# Patient Record
Sex: Male | Born: 1950
Health system: Southern US, Community
[De-identification: ages and names within clinical notes are randomized; demographics above are authoritative.]

## PROBLEM LIST (undated history)

## (undated) DIAGNOSIS — I447 Left bundle-branch block, unspecified: Secondary | ICD-10-CM

## (undated) DIAGNOSIS — N486 Induration penis plastica: Secondary | ICD-10-CM

## (undated) DIAGNOSIS — Z95810 Presence of automatic (implantable) cardiac defibrillator: Secondary | ICD-10-CM

## (undated) DIAGNOSIS — M1990 Unspecified osteoarthritis, unspecified site: Secondary | ICD-10-CM

## (undated) DIAGNOSIS — Z950 Presence of cardiac pacemaker: Secondary | ICD-10-CM

## (undated) HISTORY — PX: CARDIOVASCULAR STRESS TEST: SHX262

---

## 1991-03-19 HISTORY — PX: OTHER SURGICAL HISTORY: SHX169

## 2002-10-18 ENCOUNTER — Inpatient Hospital Stay (HOSPITAL_COMMUNITY): Admission: EM | Admit: 2002-10-18 | Discharge: 2002-10-18 | Payer: Self-pay | Admitting: Emergency Medicine

## 2002-10-18 ENCOUNTER — Encounter: Payer: Self-pay | Admitting: *Deleted

## 2002-10-18 ENCOUNTER — Encounter: Payer: Self-pay | Admitting: Emergency Medicine

## 2003-03-19 HISTORY — PX: OTHER SURGICAL HISTORY: SHX169

## 2003-12-07 ENCOUNTER — Encounter: Admission: RE | Admit: 2003-12-07 | Discharge: 2003-12-07 | Payer: Self-pay | Admitting: Family Medicine

## 2003-12-19 ENCOUNTER — Encounter: Admission: RE | Admit: 2003-12-19 | Discharge: 2003-12-19 | Payer: Self-pay | Admitting: Gastroenterology

## 2003-12-26 ENCOUNTER — Ambulatory Visit (HOSPITAL_COMMUNITY): Admission: RE | Admit: 2003-12-26 | Discharge: 2003-12-26 | Payer: Self-pay | Admitting: Gastroenterology

## 2003-12-26 ENCOUNTER — Encounter (INDEPENDENT_AMBULATORY_CARE_PROVIDER_SITE_OTHER): Payer: Self-pay | Admitting: Specialist

## 2003-12-26 LAB — HM COLONOSCOPY

## 2004-03-06 ENCOUNTER — Ambulatory Visit: Payer: Self-pay | Admitting: Ophthalmology

## 2004-03-18 HISTORY — PX: CATARACT EXTRACTION W/ INTRAOCULAR LENS IMPLANT: SHX1309

## 2007-10-09 ENCOUNTER — Encounter (INDEPENDENT_AMBULATORY_CARE_PROVIDER_SITE_OTHER): Payer: Self-pay | Admitting: General Surgery

## 2007-10-09 ENCOUNTER — Ambulatory Visit (HOSPITAL_COMMUNITY): Admission: RE | Admit: 2007-10-09 | Discharge: 2007-10-09 | Payer: Self-pay | Admitting: General Surgery

## 2007-10-09 HISTORY — PX: LAPAROSCOPIC CHOLECYSTECTOMY: SUR755

## 2008-02-29 LAB — HM COLONOSCOPY

## 2010-07-31 NOTE — Op Note (Signed)
NAME:  George [name], George [name] NO.:  [id number]   MEDICAL RECORD NO.:  [id number]          PATIENT TYPE:  AMB   LOCATION:  DAY                          FACILITY:  [facility]   PHYSICIAN:  Sharlet Salina T. Hoxworth, M.D.DATE OF BIRTH:  August 28, 1950   DATE OF PROCEDURE:  10/09/2007  DATE OF DISCHARGE:                               OPERATIVE REPORT   PREOPERATIVE DIAGNOSIS:  Cholecystitis.   POSTOPERATIVE DIAGNOSIS:  Cholecystitis.   SURGICAL PROCEDURES:  Laparoscopic cholecystectomy with intraoperative  cholangiogram.   SURGEON:  Sharlet Salina T. Hoxworth, M.D.   ANESTHESIA:  General.   BRIEF HISTORY:  [name] is a 60 year old male with a previous history  of intermittent right upper quadrant pain.  He now presents with  persistent pain over several days and tenderness on exam.  Gallbladder  ultrasound has shown thickening and edema of the gallbladder wall and a  7 mm gallbladder polyp.  He is felt to have some degree of acute  cholecystitis and urgent laparoscopic cholecystectomy with cholangiogram  has been recommended and accepted.  The nature of the procedure,  indications, risks of bleeding, infection, bile leak, bile duct injury  were discussed and understood.  He is now brought to the operating room  for this procedure.   DESCRIPTION OF OPERATION:  The patient was brought to the operating  room, placed in supine position on the operating table and general  endotracheal anesthesia was induced.  PAS were in place.  He received  preoperative IV antibiotics.  The abdomen was widely sterilely prepped  and draped.  Correct patient and procedure were verified.  The trocar  sites were anesthetized with local anesthesia.  Open Hasson technique  was used at the umbilicus.  Pneumoperitoneum was established and trocars  placed under direct vision.  The gallbladder was somewhat edematous,  fairly distended and had some evidence of chronic inflammation as well.  The fundus was grasped  and elevated up over the liver and then retracted  inferolaterally.  Peritoneum anterior and posterior to Calot's triangle  was incised and the distal gallbladder dissected.  The cystic artery was  clearly identified coursing up onto the gallbladder wall and was divided  between two proximal and one distal clip.  Calot's triangle was then  skeletonized and the cystic duct gallbladder junction dissected 360  degrees.  When the anatomy was clear, the cystic duct was clipped at the  gallbladder junction and an operative cholangiogram obtained through the  cystic duct.  This showed good filling of the intrahepatic ducts and  common bile duct with free flow into the duodenum and no filling  defects.  Following this, the cholangiocath was removed and the cystic  duct was triply clipped proximally and divided.  The gallbladder was  then dissected free from its bed using hook cautery and removed via an  EndoCatch bag.  The right  upper quadrant was thoroughly irrigated.  Hemostasis assured.  Trocars  were removed under direct vision, all CO2 evacuated.  The mattress  suture was secured at the umbilicus.  Skin incisions were closed with  Dermabond.  The patient  was taken to recovery in good condition.      Lorne Skeens. Hoxworth, M.D.  Electronically Signed     BTH/MEDQ  D:  10/09/2007  T:  10/09/2007  Job:  [id number]

## 2010-08-03 NOTE — Op Note (Signed)
NAME:  GIULIO, BERTINO NO.:  [id number]   MEDICAL RECORD NO.:  [id number]          PATIENT TYPE:  AMB   LOCATION:  ENDO                         FACILITY:  [facility]   PHYSICIAN:  Graylin Shiver, M.D.   DATE OF BIRTH:  10/19/1950   DATE OF PROCEDURE:  12/26/2003  DATE OF DISCHARGE:                                 OPERATIVE REPORT   INDICATION:  Rectal bleeding.   ENDOSCOPIST:  Graylin Shiver, M.D.   INDICATION:  Informed consent was obtained after explanation of the risks of  bleeding, infection and perforation.   PRE-MEDICATIONS:  Fentanyl 85 mcg IV, Versed 8.5 mg IV.   PROCEDURE:  With the patient in the left lateral decubitus position, a  rectal exam was performed; no masses were felt.  The Olympus colonoscope was  inserted into the rectum and advanced around the colon to the cecum; cecal  landmarks were identified.  The cecum looked normal.  There were some  scattered diverticula noted in the ascending colon and transverse colon.  The descending colon and sigmoid showed extensive diverticulosis.  In the  proximal sigmoid, there was a 12-mm pedunculated polyp; this was snared and  removed by snare-cautery technique.  The cautery site looked good and the  polyp was retrieved.  The rectum was normal.  He tolerated the procedure  well without complications.   IMPRESSION:  1.  Sigmoid polyp.  2.  Universal diverticulosis.   PLAN:  The pathology will be checked.       SFG/MEDQ  D:  12/26/2003  T:  12/27/2003  Job:  [id number]   cc:   Zollie Pee.D.

## 2010-08-03 NOTE — Discharge Summary (Signed)
   NAME:  George [name], George [name] NO.:  [id number]   MEDICAL RECORD NO.:  [id number]                   PATIENT TYPE:  INP   LOCATION:  2019                                 FACILITY:  [facility]   PHYSICIAN:  Olga Millers, M.D.                DATE OF BIRTH:  [birth year]   DATE OF ADMISSION:  10/18/2002  DATE OF DISCHARGE:  10/18/2002                           DISCHARGE SUMMARY - REFERRING   PROCEDURES:  Adenosine Cardiolite.   REASON FOR ADMISSION:  Please refer to the dictated admission note.   LABORATORY DATA:  Cardiac enzymes:  CPK-MB and troponin I markers normal (x  3).  Hemoglobin/hematocrit normal.  Sodium 138, potassium 3.2, glucose 118,  BUN 16, creatinine 0.8.   Admission chest x-ray:  No acute abnormality.   HOSPITAL COURSE:  Following admission for a singular episode of atypical  chest pain, the patient ruled out for myocardial infarction with all serial  cardiac markers within normal limits.  He was thus referred for stress  Cardiolite testing.   The patient underwent adenosine Cardiolite stress test with no noted chest  discomfort.  No significant ST abnormalities noted during serial EKG  tracing.  Subsequent review of perfusion images revealed no evidence of  ischemia; calculated ejection fraction 60%.   These results were reviewed with Charlies Constable, M.D.  The patient was cleared  for discharge with recommendation to proceed with an outpatient 2-D  echocardiogram.   DISCHARGE MEDICATIONS:  Coated aspirin 81 mg daily.   DISCHARGE INSTRUCTIONS:  2-D echocardiogram scheduled for Monday, November 01, 2002, at 10 a.m.  The patient is scheduled to follow up with Charlies Constable,  M.D., on November 04, 2002, at 9:45 a.m.   DISCHARGE DIAGNOSES:  1. Nonischemic chest pain.     a. Normal serial cardiac markers.     b. Normal adenosine Cardiolite; ejection fraction 60%.  2. Left bundle branch block.  3. Hypokalemia.      Gene Serpe, P.A. [facility]                       Olga Millers, M.D.    GS/MEDQ  D:  10/18/2002  T:  10/18/2002  Job:  [id number]

## 2010-08-03 NOTE — Discharge Summary (Signed)
   NAME:  TEDRIC, LEETH                            ACCOUNT NO.:  [id number]   MEDICAL RECORD NO.:  [id number]                   PATIENT TYPE:  INP   LOCATION:  2019                                 FACILITY:  [facility]   PHYSICIAN:  Gene Serpe, P.A. [facility]                DATE OF BIRTH:  [birth year]   DATE OF ADMISSION:  10/18/2002  DATE OF DISCHARGE:  10/18/2002                           DISCHARGE SUMMARY - REFERRING   HOSPITAL COURSE:  Hypokalemia was noted and repleted prior to discharge.                                                   Gene Serpe, P.A. [facility]    GS/MEDQ  D:  10/18/2002  T:  10/18/2002  Job:  [id number]

## 2010-08-03 NOTE — H&P (Signed)
NAME:  George [name], George [name] NO.:  [id number]   MEDICAL RECORD NO.:  [id number]                   PATIENT TYPE:  EMS   LOCATION:  MAJO                                 FACILITY:  [facility]   PHYSICIAN:  Olga Millers, M.D.                DATE OF BIRTH:  24-Aug-1950   DATE OF ADMISSION:  10/18/2002  DATE OF DISCHARGE:                                HISTORY & PHYSICAL   PHYSICIANS:  Primary cardiologist: None.  Primary care physician: Lianne Bushy, M.D.   CHIEF COMPLAINT:  Chest pain.   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  However, this evening,  October 17, 2002, after sexual intercourse, had onset of right leg cramping  that preceded the onset of chest pain associated with  shortness of breath,  nausea, and two loose stools.  The patient denied any diaphoresis.  The pain  lasted for two to three hours and was accompanied by tingling all over and  hyperventilation, prompting the patient to call EMS.  EMS applied  nitroglycerin spray x 3 with no relief.  EKG revealed left bundle branch  block with no prior studies for comparison.  Chest pain eventually resolved  spontaneously around 5 a.m.   PAST MEDICAL HISTORY:  No tobacco, no drugs, no past medical history.   ALLERGIES:  No known drug allergies.   MEDICATIONS:  None.   SOCIAL HISTORY:  [place]  and works as a Chief Strategy Officer.  He denies any history of  tobacco, alcohol, or drug use.   FAMILY HISTORY:  Mother is alive in her 53s with hypertension,  hyperlipidemia, sleep apnea, and diabetes.  Father is alive at age 41 with  high blood pressure.   REVIEW OF SYSTEMS:  Essentially negative except for the chest pain,  shortness of breath, and loose stools with nausea as described  above.  The  patient did endorse some weakness in his extremities during this episode  which was somewhat poorly characterized.  He otherwise had no changes in  visual or auditory acuity, no neurologic changes or sensation defects.   PHYSICAL EXAMINATION:  VITAL SIGNS:  Temperature 98.6, heart rate 80,  respiratory rate 13, blood pressure 144/61, oxygen saturation 100% on 2  liters nasal cannula.  GENERAL:  No apparent distress.  HEENT:  Normocephalic and atraumatic.  Pupils are equal, round, and reactive  to light.  Extraocular movements intact.  NECK:  Supple without evidence of bruit or JVD.  No evidence of  lymphadenopathy.  CARDIOVASCULAR:  Normal S1 and S2 with no murmur.  LUNGS:  Clear to auscultation bilaterally.  SKIN:  No rash.  ABDOMEN:  Soft, nontender, nondistended with positive bowel sounds.  EXTREMITIES:  No edema, 2+ pulses in all four extremities easily palpated  and symmetric with no evidence of bruit.  NEUROLOGIC:  Grossly intact; however, gait was not tested.   LABORATORY DATA:  EKG: Normal sinus rhythm, rate of 77, and evidence of a  left bundle branch block as previously mentioned.   Hematocrit 46.  Potassium 3.2, sodium 138, chloride 107, bicarb 22, BUN 16,  creatinine 0.8, glucose 118.  Anion gap 13.  CK-MB 1.8, troponin I less than  0.05 with cardiac markers negative x 3.   ASSESSMENT AND PLAN:  1. A 60 year old with one episode of atypical chest pain, essentially ruled     out for myocardial infarction with negative cardiac markers x 3, left     bundle branch block on electrocardiogram that is presumable old.  We will     continue to leave patient n.p.o. with evaluation to rule out     insignificant ischemia with adenosine Cardiolite and check a lipid     profile for primary prevention of coronary artery disease.  2. Mild hypokalemia.  Will supplement with potassium 40 mEq p.o. x 1.  3. Other possible causes for patient's atypical chest pain: The  patient's     symptoms have completely resolved, and he has never had respiratory     distress as a predominant component of his symptoms; however,     consideration of a spiral CT to rule out pulmonary embolus could be     considered if patient should have recurrent symptoms and adenosine     Cardiolite is unrevealing.      Verne Grain, MD                      Olga Millers, M.D.    DDH/MEDQ  D:  10/18/2002  T:  10/18/2002  Job:  [id number]

## 2010-12-14 LAB — HEMOGLOBIN AND HEMATOCRIT, BLOOD
HCT: 41.2
Hemoglobin: 13.9

## 2011-11-21 ENCOUNTER — Other Ambulatory Visit: Payer: Self-pay | Admitting: Urology

## 2012-01-14 ENCOUNTER — Encounter (HOSPITAL_BASED_OUTPATIENT_CLINIC_OR_DEPARTMENT_OTHER): Payer: Self-pay | Admitting: *Deleted

## 2012-01-14 NOTE — Progress Notes (Signed)
NPO AFTER MN. ARRIVES AT 0615. NEEDS HG AND EKG.

## 2012-01-16 NOTE — H&P (Signed)
History of Present Illness      Peyronie's disease: He was able to identify penile trauma that occurred during intercourse in 5/13. He had significant pain but did not hear any cracking sound. He still has a firm erection but indicates that it been was somewhat downward into the left at the mid shaft. He also is still able to engage in intercourse although he said he does require some effort. He would like to consider some form of therapy.    He has a past urologic history of having a benign epididymal cyst and small varicocele.    Interval history: He reports there has been no increase in his curvature but there has also not been any decrease that he has been able to appreciate. He denies any pain with erections. He continues to still achieve an erection.   Past Medical History Problems  1. History of  Arthritis V13.4  Surgical History Problems  1. History of  Cholecystectomy 2. History of  Eye Surgery 3. History of  Hernia Repair 4. History of  Retinal Detachment Repair Topical Antibiotic Drops Applied  Current Meds 1. No Reported Medications  Allergies Medication  1. No Known Drug Allergies  Family History Problems  1. Paternal history of  Atrial Fibrillation 2. Maternal history of  Depression 3. Sororal history of  Depression 4. Fraternal history of  Depression 5. Family history of  Family Health Status Number Of Children 1 daughter, 1 son 6. Son's history of  Hematuria 7. Son's history of  Nephrolithiasis 8. Paternal uncle's history of  Nephrolithiasis  Social History Problems  1. Caffeine Use 2 litr of Dr. Reino Kent, and tea occassionally 2. Marital History - Currently Married 3. Never A Smoker 4. Occupation: Music therapist Denied  5. History of  Alcohol Use 6. History of  Tobacco Use Review of Systems Genitourinary, constitutional, skin, eye, otolaryngeal, hematologic/lymphatic, cardiovascular, pulmonary, endocrine, musculoskeletal, neurological and psychiatric  system(s) were reviewed and pertinent findings if present are noted.  Gastrointestinal: abdominal pain.    Vitals Vital Signs Blood Pressure Blood Pressure:  168/100 Temperature Temperature: 65F Pulse Heart Rate: 68  Physical Exam Constitutional: Well nourished and well developed. No acute distress.  ENT: The ears and nose are normal in appearance.  Neck: The appearance of the neck is normal and no neck mass is present.  Pulmonary: No respiratory distress and normal respiratory rhythm and effort.  Cardiovascular: Heart rate and rhythm are normal. No peripheral edema.  Abdomen: The abdomen is soft and nontender. No masses are palpated. No CVA tenderness. No hernias are palpable. No hepatosplenomegaly noted.  . Genitourinary: On examination the penile shaft was normal. He has a small hydrocele on the right. He had an epididymal cyst on the head of the epididymis. It was a little bit firm. Lymphatics: The femoral and inguinal nodes are not enlarged or tender.  Skin: Normal skin turgor, no visible rash and no visible skin lesions.  Neuro/Psych: Mood and affect are appropriate.   Assessment Assessed  1. Peyronie's Disease 607.85   I had a long discussion with him today about the fact that he has scar tissue that has resulted in shortening of his penis on the left-hand side and curvature with erection. It appears to be stable at this time. He showed me a picture and he does not have any significant narrowing of the penis in the area of the scar tissue. I could palpate a plaque in the lateral aspect of the left corpus cavernosum at the mid  penis. We discussed the options at this point. Because he is still achieving an erection and can engage in intercourse we discussed observation. I also discussed the possible FDA approval of a collagenase that would allow softening of the scar and some straightening of the penis. We discussed penile prosthesis which is not indicated since he does still  achieving an erection. I then discussed plaque incision and patch grafting versus plication. We discussed the pluses and minuses of each of these procedures. He specifically was told that his Peyronie's plaque has caused some foreshortening of the penis and then a plication procedure would cause shortening on the contralateral side resulting in some shortening of the penis. He said he wanted to give the plication procedure some further thought and he has some big jobs coming up so if he was going to proceed with surgery he would like to do it after his work finishes up. I told him to contact me so we can go ahead and schedule surgery at a time that worked best for him if he did choose to proceed in this fashion.   Plan He contacted me and indicated he wished to proceed with surgical correction of his penile curvature using a 16-dot plication technique.

## 2012-01-17 ENCOUNTER — Encounter (HOSPITAL_BASED_OUTPATIENT_CLINIC_OR_DEPARTMENT_OTHER): Payer: Self-pay | Admitting: *Deleted

## 2012-01-17 ENCOUNTER — Ambulatory Visit (HOSPITAL_BASED_OUTPATIENT_CLINIC_OR_DEPARTMENT_OTHER)
Admission: RE | Admit: 2012-01-17 | Discharge: 2012-01-17 | Disposition: A | Discharge status: Home / Self Care | Payer: BC Managed Care – PPO | Source: Ambulatory Visit | Attending: Urology | Admitting: Urology

## 2012-01-17 ENCOUNTER — Encounter (HOSPITAL_BASED_OUTPATIENT_CLINIC_OR_DEPARTMENT_OTHER)
Admission: RE | Disposition: A | Discharge status: Home / Self Care | Payer: Self-pay | Source: Ambulatory Visit | Attending: Urology

## 2012-01-17 ENCOUNTER — Encounter (HOSPITAL_BASED_OUTPATIENT_CLINIC_OR_DEPARTMENT_OTHER): Payer: Self-pay | Admitting: Anesthesiology

## 2012-01-17 ENCOUNTER — Ambulatory Visit (HOSPITAL_BASED_OUTPATIENT_CLINIC_OR_DEPARTMENT_OTHER): Payer: BC Managed Care – PPO | Admitting: Anesthesiology

## 2012-01-17 DIAGNOSIS — N486 Induration penis plastica: Secondary | ICD-10-CM | POA: Insufficient documentation

## 2012-01-17 HISTORY — DX: Left bundle-branch block, unspecified: I44.7

## 2012-01-17 HISTORY — DX: Induration penis plastica: N48.6

## 2012-01-17 HISTORY — DX: Unspecified osteoarthritis, unspecified site: M19.90

## 2012-01-17 HISTORY — PX: NESBIT PROCEDURE: SHX2087

## 2012-01-17 LAB — POCT HEMOGLOBIN-HEMACUE: Hemoglobin: 14.1 g/dL (ref 13.0–17.0)

## 2012-01-17 SURGERY — NESBIT PROCEDURE
Anesthesia: General | Site: Penis | Wound class: Clean

## 2012-01-17 MED ORDER — PROPOFOL 10 MG/ML IV BOLUS
INTRAVENOUS | Status: DC | PRN
  Administered 2012-01-17: 200 mg via INTRAVENOUS

## 2012-01-17 MED ORDER — MIDAZOLAM HCL 5 MG/5ML IJ SOLN
INTRAMUSCULAR | Status: DC | PRN
  Administered 2012-01-17: 2 mg via INTRAVENOUS

## 2012-01-17 MED ORDER — PROMETHAZINE HCL 25 MG/ML IJ SOLN
6.2500 mg | INTRAMUSCULAR | Status: DC | PRN
  Filled 2012-01-17: qty 1

## 2012-01-17 MED ORDER — DEXAMETHASONE SODIUM PHOSPHATE 4 MG/ML IJ SOLN
INTRAMUSCULAR | Status: DC | PRN
  Administered 2012-01-17: 8 mg via INTRAVENOUS

## 2012-01-17 MED ORDER — BUPIVACAINE HCL (PF) 0.25 % IJ SOLN
INTRAMUSCULAR | Status: DC | PRN
  Administered 2012-01-17: 10 mL

## 2012-01-17 MED ORDER — BACITRACIN-NEOMYCIN-POLYMYXIN 400-5-5000 EX OINT
TOPICAL_OINTMENT | CUTANEOUS | Status: DC | PRN
  Administered 2012-01-17: 1 via TOPICAL

## 2012-01-17 MED ORDER — ACETAMINOPHEN 10 MG/ML IV SOLN
INTRAVENOUS | Status: DC | PRN
  Administered 2012-01-17: 1000 mg via INTRAVENOUS

## 2012-01-17 MED ORDER — LIDOCAINE HCL (CARDIAC) 20 MG/ML IV SOLN
INTRAVENOUS | Status: DC | PRN
  Administered 2012-01-17: 60 mg via INTRAVENOUS

## 2012-01-17 MED ORDER — FENTANYL CITRATE 0.05 MG/ML IJ SOLN
25.0000 ug | INTRAMUSCULAR | Status: DC | PRN
  Administered 2012-01-17: 25 ug via INTRAVENOUS
  Filled 2012-01-17: qty 1

## 2012-01-17 MED ORDER — FENTANYL CITRATE 0.05 MG/ML IJ SOLN
INTRAMUSCULAR | Status: DC | PRN
  Administered 2012-01-17 (×2): 25 ug via INTRAVENOUS
  Administered 2012-01-17: 50 ug via INTRAVENOUS

## 2012-01-17 MED ORDER — KETOROLAC TROMETHAMINE 30 MG/ML IJ SOLN
15.0000 mg | Freq: Once | INTRAMUSCULAR | Status: DC | PRN
  Filled 2012-01-17: qty 1

## 2012-01-17 MED ORDER — CEFAZOLIN SODIUM-DEXTROSE 2-3 GM-% IV SOLR
2.0000 g | INTRAVENOUS | Status: AC
  Administered 2012-01-17: 2 g via INTRAVENOUS
  Filled 2012-01-17: qty 50

## 2012-01-17 MED ORDER — ONDANSETRON HCL 4 MG/2ML IJ SOLN
INTRAMUSCULAR | Status: DC | PRN
  Administered 2012-01-17: 4 mg via INTRAVENOUS

## 2012-01-17 MED ORDER — PHENYLEPHRINE HCL (PRESSORS) 10 MG/ML IJ SOLN
INTRAMUSCULAR | Status: DC | PRN
  Administered 2012-01-17: 1 mL

## 2012-01-17 MED ORDER — HYDROCODONE-ACETAMINOPHEN 10-325 MG PO TABS: 1.0000 | ORAL_TABLET | ORAL | Status: DC | PRN

## 2012-01-17 MED ORDER — PAPAVERINE HCL 30 MG/ML IJ SOLN
INTRAMUSCULAR | Status: DC | PRN
  Administered 2012-01-17: 5.5 mL via INTRAVENOUS

## 2012-01-17 MED ORDER — HYDROCODONE-ACETAMINOPHEN 5-325 MG PO TABS
1.0000 | ORAL_TABLET | ORAL | Status: DC | PRN
  Administered 2012-01-17: 1 via ORAL
  Filled 2012-01-17: qty 2

## 2012-01-17 MED ORDER — LACTATED RINGERS IV SOLN
INTRAVENOUS | Status: DC
  Administered 2012-01-17 (×2): via INTRAVENOUS
  Filled 2012-01-17: qty 1000

## 2012-01-17 SURGICAL SUPPLY — 58 items
BAND RUBBER #16 2-1/2X1/16 STR (MISCELLANEOUS) IMPLANT
BANDAGE CO FLEX L/F 1IN X 5YD (GAUZE/BANDAGES/DRESSINGS) IMPLANT
BANDAGE CO FLEX L/F 2IN X 5YD (GAUZE/BANDAGES/DRESSINGS) IMPLANT
BANDAGE GAUZE ELAST BULKY 4 IN (GAUZE/BANDAGES/DRESSINGS) ×4 IMPLANT
BLADE SURG 15 STRL LF DISP TIS (BLADE) ×1 IMPLANT
BLADE SURG 15 STRL SS (BLADE) ×1
BLADE SURG ROTATE 9660 (MISCELLANEOUS) ×2 IMPLANT
BNDG COHESIVE 3X5 TAN STRL LF (GAUZE/BANDAGES/DRESSINGS) ×2 IMPLANT
CANISTER SUCTION 1200CC (MISCELLANEOUS) IMPLANT
CANISTER SUCTION 2500CC (MISCELLANEOUS) ×2 IMPLANT
CATH FOLEY 2WAY SLVR  5CC 16FR (CATHETERS) ×1
CATH FOLEY 2WAY SLVR 5CC 16FR (CATHETERS) ×1 IMPLANT
CATH ROBINSON RED A/P 8FR (CATHETERS) ×2 IMPLANT
CLOTH BEACON ORANGE TIMEOUT ST (SAFETY) ×2 IMPLANT
COVER MAYO STAND STRL (DRAPES) ×2 IMPLANT
COVER TABLE BACK 60X90 (DRAPES) ×2 IMPLANT
DRAIN PENROSE 18X1/4 LTX STRL (WOUND CARE) IMPLANT
DRAPE PED LAPAROTOMY (DRAPES) ×2 IMPLANT
ELECT NEEDLE TIP 2.8 STRL (NEEDLE) ×2 IMPLANT
ELECT REM PT RETURN 9FT ADLT (ELECTROSURGICAL) ×2
ELECTRODE REM PT RTRN 9FT ADLT (ELECTROSURGICAL) ×1 IMPLANT
GAUZE SPONGE 4X4 12PLY STRL LF (GAUZE/BANDAGES/DRESSINGS) ×2 IMPLANT
GLOVE BIO SURGEON STRL SZ 6.5 (GLOVE) ×4 IMPLANT
GLOVE BIO SURGEON STRL SZ8 (GLOVE) ×2 IMPLANT
GLOVE ECLIPSE 6.0 STRL STRAW (GLOVE) ×2 IMPLANT
GOWN PREVENTION PLUS LG XLONG (DISPOSABLE) ×4 IMPLANT
GOWN STRL REIN XL XLG (GOWN DISPOSABLE) ×2 IMPLANT
IV NS IRRIG 3000ML ARTHROMATIC (IV SOLUTION) IMPLANT
NEEDLE HYPO 30X.5 LL (NEEDLE) ×4 IMPLANT
NS IRRIG 500ML POUR BTL (IV SOLUTION) ×2 IMPLANT
PACK BASIN DAY SURGERY FS (CUSTOM PROCEDURE TRAY) ×2 IMPLANT
PENCIL BUTTON HOLSTER BLD 10FT (ELECTRODE) ×2 IMPLANT
PLUG CATH AND CAP STER (CATHETERS) ×2 IMPLANT
SET IRRIG Y TYPE TUR BLADDER L (SET/KITS/TRAYS/PACK) IMPLANT
SPONGE LAP 4X18 X RAY DECT (DISPOSABLE) IMPLANT
SUCTION FRAZIER TIP 10 FR DISP (SUCTIONS) IMPLANT
SUPPORT SCROTAL LG STRP (MISCELLANEOUS) ×2 IMPLANT
SUT CHROMIC 3 0 SH 27 (SUTURE) ×4 IMPLANT
SUT CHROMIC 4 0 RB 1X27 (SUTURE) ×4 IMPLANT
SUT CHROMIC 5 0 RB 1 27 (SUTURE) IMPLANT
SUT ETHIBOND 2 0 SH (SUTURE) ×3
SUT ETHIBOND 2 0 SH 36X2 (SUTURE) ×3 IMPLANT
SUT ETHIBOND 2 OS 4 DA (SUTURE) IMPLANT
SUT ETHIBOND 3-0 V-5 (SUTURE) IMPLANT
SUT PDS AB 4-0 RB1 27 (SUTURE) IMPLANT
SUT SILK 4 0 (SUTURE)
SUT SILK 4-0 18XBRD TIE 12 (SUTURE) IMPLANT
SUT VIC AB 5-0 RB1 27 (SUTURE) IMPLANT
SYR 3ML 23GX1 SAFETY (SYRINGE) ×4 IMPLANT
SYR BULB IRRIGATION 50ML (SYRINGE) ×2 IMPLANT
SYR CONTROL 10ML LL (SYRINGE) ×2 IMPLANT
SYR TB 1ML 27GX1/2 SAFE (SYRINGE) ×2 IMPLANT
SYR TB 1ML 27GX1/2 SAFETY (SYRINGE) ×2
SYRINGE 10CC LL (SYRINGE) ×4 IMPLANT
TOWEL OR 17X24 6PK STRL BLUE (TOWEL DISPOSABLE) ×4 IMPLANT
TUBE CONNECTING 12X1/4 (SUCTIONS) ×2 IMPLANT
WATER STERILE IRR 500ML POUR (IV SOLUTION) ×2 IMPLANT
YANKAUER SUCT BULB TIP NO VENT (SUCTIONS) ×2 IMPLANT

## 2012-01-17 NOTE — Anesthesia Procedure Notes (Signed)
Procedure Name: LMA Insertion Date/Time: 01/17/2012 7:43 AM Performed by: Renella Cunas D Pre-anesthesia Checklist: Patient identified, Emergency Drugs available, Suction available and Patient being monitored Patient Re-evaluated:Patient Re-evaluated prior to inductionOxygen Delivery Method: Circle System Utilized Preoxygenation: Pre-oxygenation with 100% oxygen Intubation Type: IV induction Ventilation: Mask ventilation without difficulty LMA: LMA inserted LMA Size: 4.0 Number of attempts: 1 Airway Equipment and Method: bite block Placement Confirmation: positive ETCO2 Tube secured with: Tape Dental Injury: Teeth and Oropharynx as per pre-operative assessment

## 2012-01-17 NOTE — Interval H&P Note (Signed)
History and Physical Interval Note:  01/17/2012 7:05 AM  George [name]  has presented today for surgery, with the diagnosis of peyronies disease  The various methods of treatment have been discussed with the patient and family. After consideration of risks, benefits and other options for treatment, the patient has consented to  Procedure(s) (LRB) with comments: NESBIT PROCEDURE (N/A) - 16 dot plication  as a surgical intervention .  The patient's history has been reviewed, patient examined, no change in status, stable for surgery.  I have reviewed the patient's chart and labs.  Questions were answered to the patient's satisfaction.     Garnett Farm

## 2012-01-17 NOTE — Op Note (Signed)
PATIENT:  George [name]  PRE-OPERATIVE DIAGNOSIS:   Penile curvature secondary to Peyronie's disease POST-OPERATIVE DIAGNOSIS:  Same  PROCEDURE:  Procedure(s):  16-dot plication  SURGEON:  Garnett Farm  INDICATION: George [name] is a 60 year old male who developed penile curvature secondary to penile trauma. We discussed the treatment options and he has elected to proceed with surgical correction.  ANESTHESIA:  General with dorsal block   EBL:  Minimal  DRAINS:  None  LOCAL MEDICATIONS USED:  1/4% Marcaine   SPECIMEN:  None  Description of procedure: After informed consent the patient was brought to the major or, placed on table and administered general anesthesia. An official timeout was then performed. I first cleansed the mid shaft with an alcohol swab and injected 60 mg of papaverine. I then proceeded with sterile prepping and draping.  A 16 French Foley catheter was placed in the bladder was drained. I then made a midline incision on the ventral aspect of the penis and carried this down to the corpus spongiosum. I then dissected laterally to expose the corporal bodies lateral to the corpus spongiosum on each side from near the level of the glans to the base of the penis. I injected further up however in and noted significant dorsal curvature with moderate curvature to the left as well. I placed 8 evenly spaced.on the corpora several millimeters lateral to the corpus spongiosum on each side and then used 20 braided polyester suture in an in and out fashion through each of the docs placing 2 proximal and 2 distal sutures in this fashion. With a full erection I then placed a surgeon's knot in all 4 of the sutures and placed rubber shod stats on the knots. I then adjusted the tension more on the right-hand side to result in any perfectly straight penis in the superior inferior plane. I noted with tension on the sutures on the left-hand side that there still was some curvature to the left. I  therefore made adjustments by actually loosening the sutures on that side which resulted in further straightening of the penis. These were tied loosely on the left-hand side and the sutures on the right-hand side were then tied down. This resulted in a straight penis with some slight curvature to the left.   All bleeding points were cauterized and I reapproximated the deep tissue in the midline over the corpus spongiosum using running, locking 4-0 chromic suture. I injected a total of 1000 mcg of Neo-Synephrine to help with detumescence of the penis and then closed the skin with running 3-0 chromic suture. Neosporin was applied to the incision as well as dry gauze and the penis was then loosely wrapped with Curlex. The catheter was removed. I then injected Marcaine at the base of the penis for dorsal penile block in the patient was awakened and taken to the recovery room in stable and satisfactory condition. He tolerated procedure well and there were no intraoperative complications. Needle sponge and instrument counts were correct at the end of the operation.  I learned that the patient had congenital curvature to the left slightly which would account for the slight curvature at the end of the procedure to the left.  PLAN OF CARE: Discharge to home after PACU  PATIENT DISPOSITION:  PACU - hemodynamically stable.

## 2012-01-17 NOTE — Anesthesia Preprocedure Evaluation (Addendum)
Anesthesia Evaluation  Patient identified by MRN, date of birth, ID band Patient awake    Reviewed: Allergy & Precautions, H&P , NPO status , Patient's Chart, lab work & pertinent test results  Airway Mallampati: II TM Distance: <3 FB Neck ROM: Full    Dental  (+) Chipped and Dental Advisory Given   Pulmonary neg pulmonary ROS,  breath sounds clear to auscultation  Pulmonary exam normal       Cardiovascular Rhythm:Regular Rate:Normal  LBBB   Neuro/Psych negative neurological ROS  negative psych ROS   GI/Hepatic negative GI ROS, Neg liver ROS,   Endo/Other  negative endocrine ROS  Renal/GU negative Renal ROS  negative genitourinary   Musculoskeletal negative musculoskeletal ROS (+)   Abdominal   Peds negative pediatric ROS (+)  Hematology negative hematology ROS (+)   Anesthesia Other Findings   Reproductive/Obstetrics negative OB ROS                          Anesthesia Physical Anesthesia Plan  ASA: II  Anesthesia Plan: General   Post-op Pain Management:    Induction: Intravenous  Airway Management Planned: LMA  Additional Equipment:   Intra-op Plan:   Post-operative Plan:   Informed Consent: I have reviewed the patients History and Physical, chart, labs and discussed the procedure including the risks, benefits and alternatives for the proposed anesthesia with the patient or authorized representative who has indicated his/her understanding and acceptance.   Dental advisory given  Plan Discussed with: CRNA and Surgeon  Anesthesia Plan Comments:         Anesthesia Quick Evaluation

## 2012-01-17 NOTE — Anesthesia Postprocedure Evaluation (Signed)
  Anesthesia Post-op Note  Patient: George [name]  Procedure(s) Performed: Procedure(s) (LRB): NESBIT PROCEDURE (N/A)  Patient Location: PACU  Anesthesia Type: General  Level of Consciousness: awake and alert   Airway and Oxygen Therapy: Patient Spontanous Breathing  Post-op Pain: mild  Post-op Assessment: Post-op Vital signs reviewed, Patient's Cardiovascular Status Stable, Respiratory Function Stable, Patent Airway and No signs of Nausea or vomiting  Post-op Vital Signs: stable  Complications: No apparent anesthesia complications

## 2012-01-17 NOTE — Transfer of Care (Signed)
Immediate Anesthesia Transfer of Care Note  Patient: George [name]  Procedure(s) Performed: Procedure(s) (LRB): NESBIT PROCEDURE (N/A)  Patient Location: PACU  Anesthesia Type: General  Level of Consciousness: awake, oriented, sedated and patient cooperative  Airway & Oxygen Therapy: Patient Spontanous Breathing and Patient connected to face mask oxygen  Post-op Assessment: Report given to PACU RN and Post -op Vital signs reviewed and stable  Post vital signs: Reviewed and stable  Complications: No apparent anesthesia complications

## 2012-01-20 ENCOUNTER — Encounter (HOSPITAL_BASED_OUTPATIENT_CLINIC_OR_DEPARTMENT_OTHER): Payer: Self-pay | Admitting: Urology

## 2012-01-20 NOTE — Progress Notes (Signed)
Complaining of bloody drainage at incision site at times, instructed to call Dr. Margrett Rud office and report.

## 2015-09-14 ENCOUNTER — Ambulatory Visit (INDEPENDENT_AMBULATORY_CARE_PROVIDER_SITE_OTHER): Payer: BLUE CROSS/BLUE SHIELD | Admitting: Family Medicine

## 2015-09-14 ENCOUNTER — Encounter: Payer: Self-pay | Admitting: Family Medicine

## 2015-09-14 VITALS — BP 152/77 | HR 52 | Resp 12 | Ht 66.25 in | Wt 154.9 lb

## 2015-09-14 DIAGNOSIS — R5383 Other fatigue: Secondary | ICD-10-CM | POA: Diagnosis not present

## 2015-09-14 DIAGNOSIS — Z9119 Patient's noncompliance with other medical treatment and regimen: Secondary | ICD-10-CM | POA: Insufficient documentation

## 2015-09-14 DIAGNOSIS — Z91199 Patient's noncompliance with other medical treatment and regimen due to unspecified reason: Secondary | ICD-10-CM | POA: Insufficient documentation

## 2015-09-14 DIAGNOSIS — R0602 Shortness of breath: Secondary | ICD-10-CM

## 2015-09-14 DIAGNOSIS — R0609 Other forms of dyspnea: Secondary | ICD-10-CM | POA: Diagnosis not present

## 2015-09-14 DIAGNOSIS — I10 Essential (primary) hypertension: Secondary | ICD-10-CM | POA: Diagnosis not present

## 2015-09-14 DIAGNOSIS — R634 Abnormal weight loss: Secondary | ICD-10-CM | POA: Insufficient documentation

## 2015-09-14 DIAGNOSIS — N486 Induration penis plastica: Secondary | ICD-10-CM

## 2015-09-14 NOTE — Progress Notes (Signed)
Marjory Sneddon, D.O. Primary care at [place]:    Chief Complaint  Patient presents with  . Establish Care  . Shortness of Breath   New pt, here to establish care.   HPI: George [name] is a pleasant 65 y.o. male who presents to [place] at [facility] today To establish care. He has not seen any doctors in at least 15 years.  He is married and has 2 kids; 5 grandchildren. 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    He complains of unexplained weight loss and being increasing short of breath.    1) says in the past 7-8 months he's lost approximately 10 pounds without changing anything about his diet or activity. He notices this because his pants have been getting looser and looser.  2) his exercise tolerance and physical abilities have diminished without reason.  Over the past couple of months as well.  Complains of being more dyspneic on exertion.  Feels like he "walked several miles with short distances."   Lacks stamina, has generalized weakness.  Denies chest/jaw pain, heart palpitations, diaphoresis, nausea   Did have a cardiac r/o in '04.  Review of notes reveals below:   Discharge Summaries   George [name] (MR# [id number])      Discharge Summaries    Expand All Collapse All    NAME: AKUL, PRESSER ACCOUNT NO.: [id number]  MEDICAL RECORD NO.: [id number] PATIENT TYPE: INP  LOCATION: 2019 FACILITY: [facility]  PHYSICIAN: Kirk Ruths, M.D. DATE OF BIRTH: 10-06-1950  DATE OF ADMISSION: 10/18/2002 DATE OF DISCHARGE: 10/18/2002   DISCHARGE SUMMARY - REFERRING  PROCEDURES: Adenosine Cardiolite.  REASON FOR ADMISSION: Please refer to the dictated admission note.  LABORATORY DATA: Cardiac enzymes: CPK-MB and troponin I markers normal (x 3).  Hemoglobin/hematocrit normal. Sodium 138, potassium 3.2, glucose 118, BUN 16, creatinine 0.8.  Admission chest x-ray: No acute abnormality.  HOSPITAL COURSE: Following admission for a singular episode of atypical chest pain, the patient ruled out for myocardial infarction with all serial cardiac markers within normal limits. He was thus referred for stress Cardiolite testing.  The patient underwent adenosine Cardiolite stress test with no noted chest discomfort. No significant ST abnormalities noted during serial EKG tracing. Subsequent review of perfusion images revealed no evidence of ischemia; calculated ejection fraction 60%.  These results were reviewed with Eustace Quail, M.D. The patient was cleared for discharge with recommendation to proceed with an outpatient 2-D echocardiogram.  DISCHARGE MEDICATIONS: Coated aspirin 81 mg daily.  DISCHARGE INSTRUCTIONS: 2-D echocardiogram scheduled for Monday, November 01, 2002, at 10 a.m. The patient is scheduled to follow up with Eustace Quail, M.D., on November 04, 2002, at 9:45 a.m.  DISCHARGE DIAGNOSES: 1. Nonischemic chest pain.  a. Normal serial cardiac markers.  b. Normal adenosine Cardiolite; ejection fraction 60%. 2. Left bundle branch block. 3. Hypokalemia.     Gene Serpe, P.A. [facility] Kirk Ruths, M.D.         Past Medical History  Diagnosis Date  . LBBB (left bundle branch block)   . Peyronie's disease   . Arthritis THUMBS      Past Surgical History  Procedure Laterality Date  . Laparoscopic cholecystectomy  10-09-2007  . Cardiovascular stress test  10-18-2002  DR  CRENSHAW    NORMAL ADENOSINE CARDIOLITE/ EF 60%  . Repair left inguinal hernia  2005  . Cataract extraction w/ intraocular  lens implant  2006    LEFT EYE  . Repair detached retina, left eye  1993  . Nesbit procedure  01/17/2012    Procedure: NESBIT PROCEDURE;  Surgeon: [name], MD;  Location: [facility];  Service: Urology;  Laterality: N/A;  16 dot plication       Family History  Problem Relation Age of Onset  . Diabetes Mother   . Hypertension Mother   . Hyperlipidemia Mother   . Heart disease Father   . Hyperlipidemia Father   . Hypertension Father   . Heart disease Sister   . Heart disease Brother   . Healthy Daughter   . Healthy Son       History  Drug Use No  ,    History  Alcohol Use No  ,    History  Smoking status  . Never Smoker   Smokeless tobacco  . Never Used  ,     History  Sexual Activity  . Sexual Activity: Yes      Patient's Medications  New Prescriptions   No medications on file  Previous Medications   ACETAMINOPHEN (TYLENOL) 325 MG TABLET    Take 650 mg by mouth every 6 (six) hours as needed.  Modified Medications   No medications on file  Discontinued Medications   HYDROCODONE-ACETAMINOPHEN (NORCO) 10-325 MG PER TABLET    Take 1-2 tablets by mouth every 4 (four) hours as needed for pain.     Review of patient's allergies indicates no known allergies. Outpatient Encounter Prescriptions as of 09/14/2015  Medication Sig  . acetaminophen (TYLENOL) 325 MG tablet Take 650 mg by mouth every 6 (six) hours as needed.  . [DISCONTINUED] HYDROcodone-acetaminophen (NORCO) 10-325 MG per tablet Take 1-2 tablets by mouth every 4 (four) hours as needed for pain.   No facility-administered encounter medications on file as of 09/14/2015.      There are no preventive care reminders to display for this patient.   Immunization History  Administered Date(s) Administered  . Td 03/18/2006     <no information>   Fall Risk  09/14/2015  Falls in the past year? Yes  Number falls in past yr: 2 or more  Injury with Fall? No     Depression screen PHQ 2/9 09/14/2015  Decreased Interest 0  Down, Depressed, Hopeless 0  PHQ - 2 Score 0      Review of Systems:   ( Completed via Adult Medical  History Intake form today ) General:   Denies fever, chills, appetite changes, unexplained weight loss.  Optho/Auditory:   Denies visual changes, blurred vision/LOV, ringing in ears/ diff hearing Respiratory:   Denies SOB, DOE, cough, wheezing.  Cardiovascular:   Denies chest pain, palpitations, new onset peripheral edema  Gastrointestinal:   Denies nausea, vomiting, diarrhea.  Genitourinary:    Denies dysuria, increased frequency, flank pain.  Endocrine:     Denies hot or cold intolerance, polyuria, polydipsia. Musculoskeletal:  Denies unexplained myalgias, joint swelling, arthralgias, gait problems.  Skin:  Denies rash, suspicious lesions or new/ changes in moles Neurological:    Denies dizziness, syncope, unexplained weakness, lightheadedness, numbness  Psychiatric/Behavioral:   Denies mood changes, suicidal or homicidal ideations, hallucinations    Objective:   Blood pressure 152/77, pulse 52, resp. rate 12, height 5' 6.25" (1.683 m), weight 154 lb 14.4 oz (70.262 kg), SpO2 100 %. Body mass index is 24.81 kg/(m^2).  General: Well Developed, well nourished, and in no acute distress.  Neuro:  Alert and oriented x3, extra-ocular muscles intact, sensation grossly intact.  HEENT: Normocephalic, atraumatic, pupils equal round reactive to light, neck supple, no gross masses, no carotid bruits, no JVD apprec Skin: no gross suspicious lesions or rashes  Cardiac: Regular rate and rhythm, no murmurs rubs or gallops. No ttp rib cage/ costochondral jts.  Respiratory: Essentially clear to auscultation bilaterally. Not using accessory muscles, speaking in full sentences.  Abdominal: Soft, no G/R/R, not grossly distended Musculoskeletal: Ambulates w/o diff, FROM * 4 ext.  Vasc: less 2 sec cap RF, warm and pink  Psych:  No HI/SI, judgement and insight good.    Impression and Recommendations:    The patient was counselled, risk factors were discussed, anticipatory guidance given.    Fatigue Unexplained dyspnea on exertion and increasing fatigue- worsening over the past several weeks.  Large differential diagnosis discussed with patient and most potentially life-threatening reasons for sx need to be excluded.  Red flag sx d/c pt- if dev any- call 911.   Cardiology consult.  Labs/ CXR/ EKG needed: See orders    Unexplained weight loss 10 lbs in over several months may or may not be significant.  Recommend that patient keeps a log of his home weights and check every couple days.    Essential hypertension Borderline elevated blood pressure for patient's age.  Asked patient to monitor at home and keep a log and bring in next office visit.  Discussed lifestyle and dietary modifications.  If remains elevated, I recommend we start low-dose blood pressure medicine in the near future.    H/O noncompliance with medical treatment, presenting hazards to health Long discussion with patient regarding detriments of medical noncompliance and not catching diseases early rather than later  DOE (dyspnea on exertion) Told pt to take it easy over next week or two, at least until we obtain Stress test/ Cards input   Unfortunately, we were unable to obtain an EKG in the office today due to pt's hairy chest and lack of a shaver to clear it.   Explained to patient I do advise that, in addition to a chest x-ray and a broad panel of labs.  Due to the symptoms and severity of them, we both decided together that we will refer patient to cardiology at this time.  He needed a chemical stress test back in 2004 and hence, he will likely need one again which will require a cardiology consult and not just OP stress test.     Please see AVS handed out to patient at the end of our visit for further patient instructions/ counseling done pertaining to today's office visit.  Gross side effects, risk and benefits, and alternatives of medications discussed with patient.  Patient is aware that all medications  have potential side effects and we are unable to predict every sideeffect or drug-drug interaction that may occur.  Expresses verbal understanding and consents to current therapy plan and treatment regiment.  Note: This document was prepared using Dragon voice recognition software and may include unintentional dictation errors.

## 2015-09-14 NOTE — Patient Instructions (Addendum)
Check BP at home- keep log , bring in next OV  Hypertension Hypertension, commonly called high blood pressure, is when the force of blood pumping through your arteries is too strong. Your arteries are the blood vessels that carry blood from your heart throughout your body. A blood pressure reading consists of a higher number over a lower number, such as 110/72. The higher number (systolic) is the pressure inside your arteries when your heart pumps. The lower number (diastolic) is the pressure inside your arteries when your heart relaxes. Ideally you want your blood pressure below 120/80. Hypertension forces your heart to work harder to pump blood. Your arteries may become narrow or stiff. Having untreated or uncontrolled hypertension can cause heart attack, stroke, kidney disease, and other problems. RISK FACTORS Some risk factors for high blood pressure are controllable. Others are not.  Risk factors you cannot control include:   Race. You may be at higher risk if you are African American.  Age. Risk increases with age.  Gender. Men are at higher risk than women before age 64 years. After age 42, women are at higher risk than men. Risk factors you can control include:  Not getting enough exercise or physical activity.  Being overweight.  Getting too much fat, sugar, calories, or salt in your diet.  Drinking too much alcohol. SIGNS AND SYMPTOMS Hypertension does not usually cause signs or symptoms. Extremely high blood pressure (hypertensive crisis) may cause headache, anxiety, shortness of breath, and nosebleed. DIAGNOSIS To check if you have hypertension, your health care provider will measure your blood pressure while you are seated, with your arm held at the level of your heart. It should be measured at least twice using the same arm. Certain conditions can cause a difference in blood pressure between your right and left arms. A blood pressure reading that is higher than normal on one  occasion does not mean that you need treatment. If it is not clear whether you have high blood pressure, you may be asked to return on a different day to have your blood pressure checked again. Or, you may be asked to monitor your blood pressure at home for 1 or more weeks. TREATMENT Treating high blood pressure includes making lifestyle changes and possibly taking medicine. Living a healthy lifestyle can help lower high blood pressure. You may need to change some of your habits. Lifestyle changes may include:  Following the DASH diet. This diet is high in fruits, vegetables, and whole grains. It is low in salt, red meat, and added sugars.  Keep your sodium intake below 2,300 mg per day.  Getting at least 30-45 minutes of aerobic exercise at least 4 times per week.  Losing weight if necessary.  Not smoking.  Limiting alcoholic beverages.  Learning ways to reduce stress. Your health care provider may prescribe medicine if lifestyle changes are not enough to get your blood pressure under control, and if one of the following is true:  You are 35-69 years of age and your systolic blood pressure is above 140.  You are 36 years of age or older, and your systolic blood pressure is above 150.  Your diastolic blood pressure is above 90.  You have diabetes, and your systolic blood pressure is over [id number] or your diastolic blood pressure is over 90.  You have kidney disease and your blood pressure is above 140/90.  You have heart disease and your blood pressure is above 140/90. Your personal target blood pressure may vary depending  on your medical conditions, your age, and other factors. HOME CARE INSTRUCTIONS  Have your blood pressure rechecked as directed by your health care provider.   Take medicines only as directed by your health care provider. Follow the directions carefully. Blood pressure medicines must be taken as prescribed. The medicine does not work as well when you skip doses.  Skipping doses also puts you at risk for problems.  Do not smoke.   Monitor your blood pressure at home as directed by your health care provider. SEEK MEDICAL CARE IF:   You think you are having a reaction to medicines taken.  You have recurrent headaches or feel dizzy.  You have swelling in your ankles.  You have trouble with your vision. SEEK IMMEDIATE MEDICAL CARE IF:  You develop a severe headache or confusion.  You have unusual weakness, numbness, or feel faint.  You have severe chest or abdominal pain.  You vomit repeatedly.  You have trouble breathing. MAKE SURE YOU:   Understand these instructions.  Will watch your condition.  Will get help right away if you are not doing well or get worse.   This information is not intended to replace advice given to you by your health care provider. Make sure you discuss any questions you have with your health care provider.   Document Released: 03/04/2005 Document Revised: 07/19/2014 Document Reviewed: 12/25/2012 Elsevier Interactive Patient Education [employer].

## 2015-09-19 DIAGNOSIS — R0609 Other forms of dyspnea: Secondary | ICD-10-CM | POA: Insufficient documentation

## 2015-09-19 DIAGNOSIS — N486 Induration penis plastica: Secondary | ICD-10-CM | POA: Insufficient documentation

## 2015-09-19 DIAGNOSIS — Z9119 Patient's noncompliance with other medical treatment and regimen: Secondary | ICD-10-CM | POA: Insufficient documentation

## 2015-09-19 DIAGNOSIS — Z91199 Patient's noncompliance with other medical treatment and regimen due to unspecified reason: Secondary | ICD-10-CM | POA: Insufficient documentation

## 2015-09-19 NOTE — Assessment & Plan Note (Signed)
10 lbs in over several months may or may not be significant.  Recommend that patient keeps a log of his home weights and check every couple days.

## 2015-09-19 NOTE — Assessment & Plan Note (Signed)
Told pt to take it easy over next week or two, at least until we obtain Stress test/ Cards input

## 2015-09-19 NOTE — Assessment & Plan Note (Signed)
Unexplained dyspnea on exertion and increasing fatigue- worsening over the past several weeks.  Large differential diagnosis discussed with patient and most potentially life-threatening reasons for sx need to be excluded.  Red flag sx d/c pt- if dev any- call 911.   Cardiology consult.  Labs/ CXR/ EKG needed: See orders

## 2015-09-19 NOTE — Assessment & Plan Note (Signed)
Borderline elevated blood pressure for patient's age.  Asked patient to monitor at home and keep a log and bring in next office visit.  Discussed lifestyle and dietary modifications.  If remains elevated, I recommend we start low-dose blood pressure medicine in the near future.

## 2015-09-19 NOTE — Assessment & Plan Note (Signed)
Long discussion with patient regarding detriments of medical noncompliance and not catching diseases early rather than later

## 2015-09-29 ENCOUNTER — Ambulatory Visit
Admission: RE | Admit: 2015-09-29 | Discharge: 2015-09-29 | Disposition: A | Discharge status: Home / Self Care | Payer: BLUE CROSS/BLUE SHIELD | Source: Ambulatory Visit | Attending: Family Medicine | Admitting: Family Medicine

## 2015-09-29 ENCOUNTER — Other Ambulatory Visit (INDEPENDENT_AMBULATORY_CARE_PROVIDER_SITE_OTHER): Payer: BLUE CROSS/BLUE SHIELD

## 2015-09-29 DIAGNOSIS — Z91199 Patient's noncompliance with other medical treatment and regimen due to unspecified reason: Secondary | ICD-10-CM

## 2015-09-29 DIAGNOSIS — Z9119 Patient's noncompliance with other medical treatment and regimen: Secondary | ICD-10-CM

## 2015-09-29 DIAGNOSIS — R0602 Shortness of breath: Secondary | ICD-10-CM

## 2015-09-29 DIAGNOSIS — I10 Essential (primary) hypertension: Secondary | ICD-10-CM

## 2015-09-29 DIAGNOSIS — R634 Abnormal weight loss: Secondary | ICD-10-CM

## 2015-09-29 DIAGNOSIS — R5383 Other fatigue: Secondary | ICD-10-CM

## 2015-09-30 LAB — COMPREHENSIVE METABOLIC PANEL
ALBUMIN: 4.1 g/dL (ref 3.6–5.1)
ALT: 15 U/L (ref 9–46)
AST: 19 U/L (ref 10–35)
Alkaline Phosphatase: 72 U/L (ref 40–115)
BUN: 12 mg/dL (ref 7–25)
CHLORIDE: 105 mmol/L (ref 98–110)
CO2: 28 mmol/L (ref 20–31)
CREATININE: 0.85 mg/dL (ref 0.70–1.25)
Calcium: 9.3 mg/dL (ref 8.6–10.3)
Glucose, Bld: 97 mg/dL (ref 65–99)
POTASSIUM: 5.1 mmol/L (ref 3.5–5.3)
SODIUM: 140 mmol/L (ref 135–146)
Total Bilirubin: 2 mg/dL — ABNORMAL HIGH (ref 0.2–1.2)
Total Protein: 6.6 g/dL (ref 6.1–8.1)

## 2015-09-30 LAB — LIPID PANEL
CHOL/HDL RATIO: 3.1 ratio (ref <=5.0)
Cholesterol: 177 mg/dL (ref 125–200)
HDL: 57 mg/dL (ref >=40)
LDL Cholesterol: 110 mg/dL (ref <130)
TRIGLYCERIDES: 52 mg/dL (ref <150)
VLDL: 10 mg/dL (ref <30)

## 2015-09-30 LAB — CBC
HCT: 42 % (ref 38.5–50.0)
Hemoglobin: 14.2 g/dL (ref 13.2–17.1)
MCH: 30.2 pg (ref 27.0–33.0)
MCHC: 33.8 g/dL (ref 32.0–36.0)
MCV: 89.4 fL (ref 80.0–100.0)
MPV: 9.9 fL (ref 7.5–12.5)
PLATELETS: 224 10*3/uL (ref 140–400)
RBC: 4.7 MIL/uL (ref 4.20–5.80)
RDW: 13.7 % (ref 11.0–15.0)
WBC: 6.5 10*3/uL (ref 3.8–10.8)

## 2015-09-30 LAB — VITAMIN D 25 HYDROXY (VIT D DEFICIENCY, FRACTURES): Vit D, 25-Hydroxy: 27 ng/mL — ABNORMAL LOW (ref 30–100)

## 2015-09-30 LAB — TSH: TSH: 1.23 m[IU]/L (ref 0.40–4.50)

## 2015-09-30 LAB — HEMOGLOBIN A1C
Hgb A1c MFr Bld: 5.4 % (ref <5.7)
MEAN PLASMA GLUCOSE: 108 mg/dL

## 2015-09-30 LAB — VITAMIN B12: Vitamin B-12: 195 pg/mL — ABNORMAL LOW (ref 200–1100)

## 2015-10-06 ENCOUNTER — Ambulatory Visit (INDEPENDENT_AMBULATORY_CARE_PROVIDER_SITE_OTHER): Payer: BLUE CROSS/BLUE SHIELD | Admitting: Family Medicine

## 2015-10-06 ENCOUNTER — Encounter: Payer: Self-pay | Admitting: Family Medicine

## 2015-10-06 VITALS — BP 152/82 | HR 41 | Resp 18 | Wt 157.0 lb

## 2015-10-06 DIAGNOSIS — R0602 Shortness of breath: Secondary | ICD-10-CM | POA: Diagnosis not present

## 2015-10-06 DIAGNOSIS — R0609 Other forms of dyspnea: Secondary | ICD-10-CM | POA: Diagnosis not present

## 2015-10-06 DIAGNOSIS — R001 Bradycardia, unspecified: Secondary | ICD-10-CM | POA: Diagnosis not present

## 2015-10-06 DIAGNOSIS — E559 Vitamin D deficiency, unspecified: Secondary | ICD-10-CM

## 2015-10-06 DIAGNOSIS — R5383 Other fatigue: Secondary | ICD-10-CM | POA: Diagnosis not present

## 2015-10-06 DIAGNOSIS — I10 Essential (primary) hypertension: Secondary | ICD-10-CM

## 2015-10-06 DIAGNOSIS — E538 Deficiency of other specified B group vitamins: Secondary | ICD-10-CM

## 2015-10-06 NOTE — Assessment & Plan Note (Signed)
Home blood pressures are at goal.  Patient appears to have white coat syndrome on top of essential hypertension.  Explained to patient if he sees his blood pressure trending down, he is to alert [place] immediately. If he becomes symptomatic at all, he and wife know to dial 911/ED.  They will stay vigilant

## 2015-10-06 NOTE — Progress Notes (Signed)
Subjective:    Chief Complaint  Patient presents with  . Follow-up    George [name] blood pressure monitor reading was 173/93, office monitor was 160/72.    HPI: George [name] is a 65 y.o. male who presents to [facility] today With his wife and granddaughter, to review recent labs and recheck blood pressure.   He did bring in his home log of blood pressures.  - he has been checking his blood pressures at home which has been running in the 144/91 and the lowest being 122/61.   He has several readings today from his home log within that range.    He still says that he gets easily wiped out and easily exhausted with activities which did not tax him as much in the past.    His home monitor show pulses in the 50s to down to 32, he still having moments where he feels exhausted with exertion.  His energy level "just get zapped".   If he stops and rests then he gets rejuvenated after a couple minutes but otherwise if he doesn't stop he will feel like he is just so exhausted like he just ran for 10 miles-no other symptoms.  He denies any dizziness/lightheadedness, visual changes, headache, palpitations, nausea, vomiting, diarrhea, chest pains, urinary or abd symptoms.  All labs/tests reviewed in full with patient and wife today.    Past Medical History  Diagnosis Date  . LBBB (left bundle branch block)   . Peyronie's disease   . Arthritis THUMBS     Past Surgical History  Procedure Laterality Date  . Laparoscopic cholecystectomy  10-09-2007  . Cardiovascular stress test  10-18-2002  DR  CRENSHAW    NORMAL ADENOSINE CARDIOLITE/ EF 60%  . Repair left inguinal hernia  2005  . Cataract extraction w/ intraocular lens implant  2006    LEFT EYE  . Repair detached retina, left eye  1993  . Nesbit procedure  01/17/2012    Procedure: NESBIT PROCEDURE;  Surgeon: [name], MD;  Location: [facility];  Service: Urology;  Laterality: N/A;  16 dot  plication      Family History  Problem Relation Age of Onset  . Diabetes Mother   . Hypertension Mother   . Hyperlipidemia Mother   . Heart disease Father   . Hyperlipidemia Father   . Hypertension Father   . Heart disease Sister   . Heart disease Brother   . Healthy Daughter   . Healthy Son      History  Drug Use No  ,  History  Alcohol Use No  ,  History  Smoking status  . Never Smoker   Smokeless tobacco  . Never Used  ,  History  Sexual Activity  . Sexual Activity: Yes      Current Outpatient Prescriptions on File Prior to Visit  Medication Sig Dispense Refill  . acetaminophen (TYLENOL) 325 MG tablet Take 650 mg by mouth every 6 (six) hours as needed.     No current facility-administered medications on file prior to visit.    No Known Allergies    Review of Systems:  ( Completed via adult medical history intake form today ) General:  Denies fever, chills, appetite changes, unexplained weight loss.  Respiratory: Denies SOB, cough, wheezing, + DOE  Cardiovascular: Denies chest pain, palpitations.  Gastrointestinal: Denies nausea, vomiting, diarrhea, abdominal pain.  Genitourinary: Denies dysuria, increased frequency, flank pain. Endocrine:  Denies hot or cold intolerance, polyuria, polydipsia. Musculoskeletal: Denies myalgias, back pain, joint swelling, arthralgias, gait problems.  Skin: Denies pallor, rash, suspicious lesions.  Neurological: Denies dizziness, seizures, syncope, unexplained weakness, lightheadedness, numbness and headaches.  Psychiatric/Behavioral: Denies mood changes, suicidal or homicidal ideations, hallucinations, sleep disturbances.    Objective:    Blood pressure 165/78, pulse 34, resp. rate 18, weight 157 lb (71.215 kg), SpO2 100 %. Body mass index is 25.14 kg/(m^2). General: Well Developed, well nourished, and in no acute distress.  HEENT: Normocephalic, atraumatic, pupils equal round reactive to light, neck supple, No  carotid bruits no JVD Skin: Warm and dry, cap RF less 2 sec Cardiac: Regular rhythm, S1, S2 WNL's, no murmurs rubs or gallops Respiratory: ECTA B/L, Not using accessory muscles, speaking in full sentences. NeuroM-Sk: Ambulates w/o assistance, moves ext * 4 w/o difficulty Psych: A and O *3, judgement and insight good, pleasant.       Recent Results (from the past 2160 hour(s))  CBC     Status: None   Collection Time: 09/29/15  9:41 AM  Result Value Ref Range   WBC 6.5 3.8 - 10.8 K/uL   RBC 4.70 4.20 - 5.80 MIL/uL   Hemoglobin 14.2 13.2 - 17.1 g/dL   HCT 42.0 38.5 - 50.0 %   MCV 89.4 80.0 - 100.0 fL   MCH 30.2 27.0 - 33.0 pg   MCHC 33.8 32.0 - 36.0 g/dL   RDW 13.7 11.0 - 15.0 %   Platelets 224 140 - 400 K/uL   MPV 9.9 7.5 - 12.5 fL    Comment: ** Please note change in unit of measure and reference range(s). **  Comprehensive metabolic panel     Status: Abnormal   Collection Time: 09/29/15  9:41 AM  Result Value Ref Range   Sodium 140 135 - 146 mmol/L   Potassium 5.1 3.5 - 5.3 mmol/L   Chloride 105 98 - 110 mmol/L   CO2 28 20 - 31 mmol/L   Glucose, Bld 97 65 - 99 mg/dL   BUN 12 7 - 25 mg/dL   Creat 0.85 0.70 - 1.25 mg/dL    Comment:   For patients > or = 63 years of age: The upper reference limit for Creatinine is approximately 13% higher for people identified as African-American.      Total Bilirubin 2.0 (H) 0.2 - 1.2 mg/dL   Alkaline Phosphatase 72 40 - 115 U/L   AST 19 10 - 35 U/L   ALT 15 9 - 46 U/L   Total Protein 6.6 6.1 - 8.1 g/dL   Albumin 4.1 3.6 - 5.1 g/dL   Calcium 9.3 8.6 - 10.3 mg/dL  Hemoglobin A1c     Status: None   Collection Time: 09/29/15  9:41 AM  Result Value Ref Range   Hgb A1c MFr Bld 5.4 <5.7 %    Comment:   For the purpose of screening for the presence of diabetes:   <5.7%       Consistent with the absence of diabetes 5.7-6.4 %   Consistent with increased risk for diabetes (prediabetes) >=6.5 %     Consistent with diabetes   This assay  result is consistent with a decreased risk of diabetes.   Currently, no consensus exists regarding use of hemoglobin A1c for diagnosis of diabetes in children.   According to American Diabetes Association (ADA) guidelines, hemoglobin A1c <7.0% represents optimal control in non-pregnant diabetic patients. Different metrics may apply to specific patient populations. Standards  of Medical Care in Diabetes (ADA).      Mean Plasma Glucose 108 mg/dL  Lipid panel     Status: None   Collection Time: 09/29/15  9:41 AM  Result Value Ref Range   Cholesterol 177 125 - 200 mg/dL   Triglycerides 52 <150 mg/dL   HDL 57 >=40 mg/dL   Total CHOL/HDL Ratio 3.1 <=5.0 Ratio   VLDL 10 <30 mg/dL   LDL Cholesterol 110 <130 mg/dL    Comment:   Total Cholesterol/HDL Ratio:CHD Risk                        Coronary Heart Disease Risk Table                                        Men       Women          1/2 Average Risk              3.4        3.3              Average Risk              5.0        4.4           2X Average Risk              9.6        7.1           3X Average Risk             23.4       11.0 Use the calculated Patient Ratio above and the CHD Risk table  to determine the patient's CHD Risk.   TSH     Status: None   Collection Time: 09/29/15  9:41 AM  Result Value Ref Range   TSH 1.23 0.40 - 4.50 mIU/L  Vitamin B12     Status: Abnormal   Collection Time: 09/29/15  9:41 AM  Result Value Ref Range   Vitamin B-12 195 (L) 200 - 1100 pg/mL  VITAMIN D 25 Hydroxy (Vit-D Deficiency, Fractures)     Status: Abnormal   Collection Time: 09/29/15  9:41 AM  Result Value Ref Range   Vit D, 25-Hydroxy 27 (L) 30 - 100 ng/mL    Comment: Vitamin D Status           25-OH Vitamin D        Deficiency                <20 ng/mL        Insufficiency         20 - 29 ng/mL        Optimal             > or = 30 ng/mL   For 25-OH Vitamin D testing on patients on D2-supplementation and patients for whom quantitation  of D2 and D3 fractions is required, the QuestAssureD 25-OH VIT D, (D2,D3), LC/MS/MS is recommended: order code [id number] (patients > 2 yrs).         Impression and Recommendations:    The patient was counselled, risk factors were discussed, anticipatory guidance given.  All labs reviewed with patient and wife, CXR WNL's.  All questions answered.   Marland Kitchen.Vitamin D insufficiency Increase vitamin D to 5000  IUs daily.  We will recheck in future  Bradycardia 48 hour Holter monitor ordered. We'll see if we can bump up his cardiology appointment which is not until mid to late August, we will call their office to see if we can get him in sooner.     Told patient to not do any exertional activities until seen by cardiology. Absolutely no activities that puts him in danger such as climbing ladders or any heights etc.    Patient knows if he gets any red flag symptoms, abdominal 911. His wife is in the office today and clearly understands these instructions.  DOE (dyspnea on exertion) Patient understands that this is likely coming from his bradycardia, which is more pronounced today in the office as well as depicted in his home blood pressure and heart rate readings  Vitamin B12 deficiency Supplementation discussed with patient. We'll recheck in future  Essential hypertension Home blood pressures are at goal.  Patient appears to have white coat syndrome on top of essential hypertension.  Explained to patient if he sees his blood pressure trending down, he is to alert [place] immediately. If he becomes symptomatic at all, he and wife know to dial 911/ED.  They will stay vigilant  Hereditary hyperbilirubinemia Patient tells me today his bilirubin has always been elevated in past labs, but no problem was ever found.  He will get me those labs from past docs and declines any further workup until I look at them.  Fatigue Explained to patient that more than likely, the cause of his fatigue and extreme  exertional exhaustion is due to bradycardia and I'm worried about an electrical pathway problem in his heart.  Holter monitor ordered, will try to get him into cards as soon as possible.    Please see AVS handed out to patient at the end of our visit for further patient instructions/ counseling done pertaining to today's office visit.   Note: This document was prepared using Dragon voice recognition software and may include unintentional dictation errors.

## 2015-10-06 NOTE — Assessment & Plan Note (Signed)
Patient understands that this is likely coming from his bradycardia, which is more pronounced today in the office as well as depicted in his home blood pressure and heart rate readings

## 2015-10-06 NOTE — Assessment & Plan Note (Deleted)
Talk to patient about increasing his vitamin D to 5000 IUs daily.  We will recheck in future

## 2015-10-06 NOTE — Patient Instructions (Addendum)
Patient states his total bilirubin has always been up. They're getting me his medical records from his old physicians.  Once we see those records we will then make a determination if further steps are warranted.  Continue to monitor your blood pressure and heart rates and keep a log and write them down and bring it to your office visits if any Dr.   they will call you in the near future for the 48 hour Holter monitor. If you do not hear from them by the early next week, call us back.  Your vitamin B12 is low I recommended B complex multivitamin daily. Your vitamin D levels low as well and I recommend 5000 international units vitamin D3 daily. We should recheck these in 6 months     Bradycardia Bradycardia is a slower-than-normal heart rate. A normal resting heart rate for an adult ranges from 60 to 100 beats per minute. With bradycardia, the resting heart rate is less than 60 beats per minute. Bradycardia is a problem if your heart cannot pump enough oxygen-rich blood through your body. Bradycardia is not a problem for everyone. For some healthy adults, a slow resting heart rate is normal.  CAUSES  Bradycardia may be caused by:  A problem with the heart's electrical system, such as heart block.  A problem with the heart's natural pacemaker (sinus node).  Heart disease, damage, or infection.  Certain medicines that treat heart conditions.  Certain conditions, such as hypothyroidism and obstructive sleep apnea. RISK FACTORS  Risk factors include:  Being 75 or older.  Having high blood pressure (hypertension), high cholesterol (hyperlipidemia), or diabetes.  Drinking heavily, using tobacco products, or using drugs.  Being stressed. SIGNS AND SYMPTOMS  Signs and symptoms include:  Light-headedness.  Faintingor near fainting.  Fatigue and weakness.  Shortness of breath.  Chest pain (angina).  Drowsiness.  Confusion.  Dizziness. DIAGNOSIS  Diagnosis of bradycardia may  include:  A physical exam.  An electrocardiogram (ECG).  Blood tests. TREATMENT  Treatment for bradycardia may include:  Treatment of an underlying condition.  Pacemaker placement. A pacemaker is a small, battery-powered device that is placed under the skin and is programmed to sense your heartbeats. If your heart rate is lower than the programmed rate, the pacemaker will pace your heart.  Changing your medicines or dosages. HOME CARE INSTRUCTIONS  Take medicines only as directed by your health care provider.  Manage any health conditions that contribute to bradycardia as directed by your health care provider.  Follow a heart-healthy diet. A dietitian can help educate you on healthy food options and changes.  Follow an exercise program approved by your health care provider.  Maintain a healthy weight. Lose weight as approved by your health care provider.  Do not use tobacco products, including cigarettes, chewing tobacco, or electronic cigarettes. If you need help quitting, ask your health care provider.  Do not use illegal drugs.  Limit alcohol intake to no more than 1 drink per day for nonpregnant women and 2 drinks per day for men. One drink equals 12 ounces of beer, 5 ounces of wine, or 1 ounces of hard liquor.  Keep all follow-up visits as directed by your health care provider. This is important. SEEK MEDICAL CARE IF:  You feel light-headed or dizzy.  You almost faint.  You feel weak or are easily fatigued during physical activity.  You experience confusion or have memory problems. SEEK IMMEDIATE MEDICAL CARE IF:   You faint.  You have an  irregular heartbeat.  You have chest pain.  You have trouble breathing. MAKE SURE YOU:   Understand these instructions.  Will watch your condition.  Will get help right away if you are not doing well or get worse.   This information is not intended to replace advice given to you by your health care provider. Make  sure you discuss any questions you have with your health care provider.   Document Released: 11/24/2001 Document Revised: 03/25/2014 Document Reviewed: 06/09/2013 Elsevier Interactive Patient Education [employer].

## 2015-10-06 NOTE — Assessment & Plan Note (Signed)
48 hour Holter monitor ordered. We'll see if we can bump up his cardiology appointment which is not until mid to late August, we will call their office to see if we can get him in sooner.     Told patient to not do any exertional activities until seen by cardiology. Absolutely no activities that puts him in danger such as climbing ladders or any heights etc.    Patient knows if he gets any red flag symptoms, abdominal 911. His wife is in the office today and clearly understands these instructions.

## 2015-10-06 NOTE — Assessment & Plan Note (Signed)
Explained to patient that more than likely, the cause of his fatigue and extreme exertional exhaustion is due to bradycardia and I'm worried about an electrical pathway problem in his heart.  Holter monitor ordered, will try to get him into cards as soon as possible.

## 2015-10-06 NOTE — Assessment & Plan Note (Signed)
Increase vitamin D to 5000 IUs daily.  We will recheck in future

## 2015-10-06 NOTE — Assessment & Plan Note (Signed)
Supplementation discussed with patient. We'll recheck in future

## 2015-10-06 NOTE — Assessment & Plan Note (Signed)
Patient tells me today his bilirubin has always been elevated in past labs, but no problem was ever found.  He will get me those labs from past docs and declines any further workup until I look at them.

## 2015-10-16 ENCOUNTER — Encounter: Payer: Self-pay | Admitting: *Deleted

## 2015-10-16 ENCOUNTER — Ambulatory Visit (HOSPITAL_COMMUNITY): Payer: BLUE CROSS/BLUE SHIELD | Attending: Cardiovascular Disease

## 2015-10-16 ENCOUNTER — Encounter: Payer: Self-pay | Admitting: Cardiology

## 2015-10-16 ENCOUNTER — Other Ambulatory Visit (HOSPITAL_COMMUNITY): Payer: Self-pay

## 2015-10-16 ENCOUNTER — Ambulatory Visit (INDEPENDENT_AMBULATORY_CARE_PROVIDER_SITE_OTHER): Payer: BLUE CROSS/BLUE SHIELD | Admitting: Cardiology

## 2015-10-16 ENCOUNTER — Encounter (INDEPENDENT_AMBULATORY_CARE_PROVIDER_SITE_OTHER): Payer: Self-pay

## 2015-10-16 VITALS — BP 136/70 | HR 36 | Ht 66.25 in | Wt 155.4 lb

## 2015-10-16 DIAGNOSIS — I351 Nonrheumatic aortic (valve) insufficiency: Secondary | ICD-10-CM | POA: Insufficient documentation

## 2015-10-16 DIAGNOSIS — R0609 Other forms of dyspnea: Secondary | ICD-10-CM

## 2015-10-16 DIAGNOSIS — I10 Essential (primary) hypertension: Secondary | ICD-10-CM | POA: Diagnosis not present

## 2015-10-16 DIAGNOSIS — R0600 Dyspnea, unspecified: Secondary | ICD-10-CM | POA: Diagnosis present

## 2015-10-16 DIAGNOSIS — I493 Ventricular premature depolarization: Secondary | ICD-10-CM | POA: Insufficient documentation

## 2015-10-16 DIAGNOSIS — I447 Left bundle-branch block, unspecified: Secondary | ICD-10-CM | POA: Insufficient documentation

## 2015-10-16 DIAGNOSIS — R001 Bradycardia, unspecified: Secondary | ICD-10-CM | POA: Diagnosis not present

## 2015-10-16 DIAGNOSIS — T733XXA Exhaustion due to excessive exertion, initial encounter: Secondary | ICD-10-CM

## 2015-10-16 DIAGNOSIS — I441 Atrioventricular block, second degree: Secondary | ICD-10-CM | POA: Insufficient documentation

## 2015-10-16 DIAGNOSIS — Z8249 Family history of ischemic heart disease and other diseases of the circulatory system: Secondary | ICD-10-CM | POA: Insufficient documentation

## 2015-10-16 DIAGNOSIS — Z01812 Encounter for preprocedural laboratory examination: Secondary | ICD-10-CM

## 2015-10-16 LAB — ECHOCARDIOGRAM COMPLETE
HEIGHTINCHES: 66.25 in
WEIGHTICAEL: 2486.4 [oz_av]

## 2015-10-16 NOTE — Patient Instructions (Signed)
Medication Instructions:   NO CHANGE  Testing/Procedures:  Your physician has requested that you have an echocardiogram. Echocardiography is a painless test that uses sound waves to create images of your heart. It provides your doctor with information about the size and shape of your heart and how well your heart's chambers and valves are working. This procedure takes approximately one hour. There are no restrictions for this procedure.    Follow-Up:  Your physician recommends that you schedule a follow-up appointment in: WITH ELECTROPHYSIOLOGY

## 2015-10-16 NOTE — Progress Notes (Signed)
Cardiology Office Note    Date:  10/16/2015   ID:  George [name], DOB 09/11/1950, MRN [id number]  PCP:  Mellody Dance, DO  Cardiologist:  Fransico Him, MD   Chief Complaint  Patient presents with  . Shortness of Breath  . Bradycardia    History of Present Illness:  George [name] is a 65 y.o. male with a history of LBBB and recently diagnosed HTN and was seen by his PCP recently due to feeling fatigued, and easily wiped out with activities.  He noticed on his home monitor that his heart rate was in the 50s but would drop down as low as 32bpm.  He has to stop quickly after doing any exertional activities and if he doesn't stop he feels like he has run a marathon.  He denies any chest pain or pressure, dizziness (except when getting up from squatting down), LE edema, palpitations, LE edema or syncope. He has also had exertional SOB.  His PCP ordered a 48 hour Holter which has not been done. He says that the fatigue has been occurring with exertion for at least 6 months but recently has gotten worse.      Past Medical History:  Diagnosis Date  . Arthritis THUMBS  . LBBB (left bundle branch block)   . Peyronie's disease     Past Surgical History:  Procedure Laterality Date  . CARDIOVASCULAR STRESS TEST  10-18-2002  DR  CRENSHAW   NORMAL ADENOSINE CARDIOLITE/ EF 60%  . CATARACT EXTRACTION W/ INTRAOCULAR LENS IMPLANT  2006   LEFT EYE  . LAPAROSCOPIC CHOLECYSTECTOMY  10-09-2007  . NESBIT PROCEDURE  01/17/2012   Procedure: NESBIT PROCEDURE;  Surgeon: [name], MD;  Location: [facility];  Service: Urology;  Laterality: N/A;  16 dot plication   . [place], LEFT EYE  1993  . REPAIR LEFT INGUINAL HERNIA  2005    Current Medications: Outpatient Medications Prior to Visit  Medication Sig Dispense Refill  . acetaminophen (TYLENOL) 325 MG tablet Take 650 mg by mouth every 6 (six) hours as needed.     No facility-administered medications prior to  visit.      Allergies:   Review of patient's allergies indicates no known allergies.   Social History   Social History  . Marital status: Married    Spouse name: N/A  . Number of children: N/A  . Years of education: N/A   Social History Main Topics  . Smoking status: Never Smoker  . Smokeless tobacco: Never Used  . Alcohol use No  . Drug use: No  . Sexual activity: Yes   Other Topics Concern  . None   Social History Narrative  . None     Family History:  The patient's family history includes Diabetes in his mother; Healthy in his daughter and son; Heart disease in his brother, father, and sister; Hyperlipidemia in his father and mother; Hypertension in his father and mother.   ROS:   Please see the history of present illness.    ROS All other systems reviewed and are negative.   PHYSICAL EXAM:   VS:  BP 136/70   Pulse (!) 36   Ht 5' 6.25" (1.683 m)   Wt 155 lb 6.4 oz (70.5 kg)   BMI 24.89 kg/m    GEN: Well nourished, well developed, in no acute distress  HEENT: normal  Neck: no JVD, carotid bruits, or masses Cardiac: RRR; no murmurs, rubs, or gallops,no edema.  Intact distal pulses bilaterally.  Respiratory:  clear to auscultation bilaterally, normal work of breathing GI: soft, nontender, nondistended, + BS MS: no deformity or atrophy  Skin: warm and dry, no rash Neuro:  Alert and Oriented x 3, Strength and sensation are intact Psych: euthymic mood, full affect  Wt Readings from Last 3 Encounters:  10/16/15 155 lb 6.4 oz (70.5 kg)  10/06/15 157 lb (71.2 kg)  09/14/15 154 lb 14.4 oz (70.3 kg)      Studies/Labs Reviewed:   EKG:  EKG is not ordered today.    Recent Labs: 09/29/2015: ALT 15; BUN 12; Creat 0.85; Hemoglobin 14.2; Platelets 224; Potassium 5.1; Sodium 140; TSH 1.23   Lipid Panel    Component Value Date/Time   CHOL 177 09/29/2015 0941   TRIG 52 09/29/2015 0941   HDL 57 09/29/2015 0941   CHOLHDL 3.1 09/29/2015 0941   VLDL 10 09/29/2015  0941   LDLCALC 110 09/29/2015 0941    Additional studies/ records that were reviewed today include:  OV notes from PCP    ASSESSMENT:    1. Bradycardia   2. Essential hypertension   3. DOE (dyspnea on exertion)   4. Fatigue due to excessive exertion, initial encounter      PLAN:  In order of problems listed above:  1. Bradycardia - He has not gotten his holter monitor yet.  He is in second degree type II AV block today along with underlying LBBB.  He has had progressive SOB, exertional fatigue and leg weakness to the point it is difficult for him to ambulate all c/w reduced CO from heart block. 2. HTN - BP controlled.  3. DOE -This certainly could be due to bradycardia with reduced CO from underlying heart block but also would be concerned about coronary ischemia/LV dysfunction.  He has never smoked but his paternal GF had CAD.  I will check a 2D echo to assess LVF and diastolic function.  If LVF normal then no further workup until after PPM.  If his symptoms resolve after pacer then no further cardiac workup.  Recent labs normal including CBC and TSH.   4. Fatigue - most likely secondary to underlying bradycardia with heart block 5. Second degree type II AV block with underlying LBBB.  Discussed with Dr. Caryl Comes and will proceed with PPM on Wed.  I will get a 2D echo today to assess LVF prior to implant.  I have discussed the risks and benefits of PPM including risk of MI/CVA/death/bleeding/infection/pericardial perforation with tamponade or Pneumothorax.  Patient understands and wishes to proceed.    I have spent a total of 45 minutes with patient reviewing office notes , old and new EKGs, labs and examining patient as well as establishing an assessment and plan that was discussed with the patient.  > 50% of time was spent in direct patient care.      Medication Adjustments/Labs and Tests Ordered: Current medicines are reviewed at length with the patient today.  Concerns regarding  medicines are outlined above.  Medication changes, Labs and Tests ordered today are listed in the Patient Instructions below.  There are no Patient Instructions on file for this visit.   Signed, Fransico Him, MD  10/16/2015 3:01 PM    [facility] [address], [place], [place]  [address] Phone: [phone]; Fax: [phone]

## 2015-10-17 LAB — CBC WITH DIFFERENTIAL/PLATELET
BASOS PCT: 0 %
Basophils Absolute: 0 cells/uL (ref 0–200)
Eosinophils Absolute: 80 cells/uL (ref 15–500)
Eosinophils Relative: 1 %
HEMATOCRIT: 41.3 % (ref 38.5–50.0)
Hemoglobin: 14.6 g/dL (ref 13.2–17.1)
LYMPHS PCT: 45 %
Lymphs Abs: 3600 cells/uL (ref 850–3900)
MCH: 31 pg (ref 27.0–33.0)
MCHC: 35.4 g/dL (ref 32.0–36.0)
MCV: 87.7 fL (ref 80.0–100.0)
MONO ABS: 640 {cells}/uL (ref 200–950)
MONOS PCT: 8 %
MPV: 10.4 fL (ref 7.5–12.5)
Neutro Abs: 3680 cells/uL (ref 1500–7800)
Neutrophils Relative %: 46 %
PLATELETS: 198 10*3/uL (ref 140–400)
RBC: 4.71 MIL/uL (ref 4.20–5.80)
RDW: 13.3 % (ref 11.0–15.0)
WBC: 8 10*3/uL (ref 3.8–10.8)

## 2015-10-17 LAB — PROTIME-INR
INR: 1
PROTHROMBIN TIME: 10.7 s (ref 9.0–11.5)

## 2015-10-17 LAB — BASIC METABOLIC PANEL
BUN: 12 mg/dL (ref 7–25)
CO2: 27 mmol/L (ref 20–31)
Calcium: 9.6 mg/dL (ref 8.6–10.3)
Chloride: 102 mmol/L (ref 98–110)
Creat: 0.87 mg/dL (ref 0.70–1.25)
Glucose, Bld: 83 mg/dL (ref 65–99)
POTASSIUM: 4.1 mmol/L (ref 3.5–5.3)
SODIUM: 139 mmol/L (ref 135–146)

## 2015-10-18 ENCOUNTER — Encounter (HOSPITAL_COMMUNITY)
Admission: RE | Disposition: A | Discharge status: Home / Self Care | Payer: Self-pay | Source: Ambulatory Visit | Attending: Internal Medicine

## 2015-10-18 ENCOUNTER — Encounter (HOSPITAL_COMMUNITY): Payer: Self-pay | Admitting: Emergency Medicine

## 2015-10-18 ENCOUNTER — Ambulatory Visit (HOSPITAL_COMMUNITY)
Admission: RE | Admit: 2015-10-18 | Discharge: 2015-10-19 | Disposition: A | Discharge status: Home / Self Care | Payer: BLUE CROSS/BLUE SHIELD | Source: Ambulatory Visit | Attending: Internal Medicine | Admitting: Internal Medicine

## 2015-10-18 ENCOUNTER — Telehealth: Payer: Self-pay | Admitting: Family Medicine

## 2015-10-18 DIAGNOSIS — I10 Essential (primary) hypertension: Secondary | ICD-10-CM | POA: Diagnosis not present

## 2015-10-18 DIAGNOSIS — I442 Atrioventricular block, complete: Secondary | ICD-10-CM | POA: Diagnosis not present

## 2015-10-18 DIAGNOSIS — I429 Cardiomyopathy, unspecified: Secondary | ICD-10-CM | POA: Diagnosis not present

## 2015-10-18 DIAGNOSIS — I160 Hypertensive urgency: Secondary | ICD-10-CM | POA: Diagnosis not present

## 2015-10-18 DIAGNOSIS — I509 Heart failure, unspecified: Secondary | ICD-10-CM | POA: Diagnosis not present

## 2015-10-18 DIAGNOSIS — I452 Bifascicular block: Secondary | ICD-10-CM | POA: Diagnosis not present

## 2015-10-18 DIAGNOSIS — I447 Left bundle-branch block, unspecified: Secondary | ICD-10-CM | POA: Diagnosis not present

## 2015-10-18 DIAGNOSIS — Z959 Presence of cardiac and vascular implant and graft, unspecified: Secondary | ICD-10-CM

## 2015-10-18 HISTORY — PX: EP IMPLANTABLE DEVICE: SHX172B

## 2015-10-18 LAB — SURGICAL PCR SCREEN
MRSA, PCR: NEGATIVE
Staphylococcus aureus: POSITIVE — AB

## 2015-10-18 SURGERY — BIV PACEMAKER INSERTION CRT-P

## 2015-10-18 MED ORDER — CEFAZOLIN IN D5W 1 GM/50ML IV SOLN
1.0000 g | Freq: Four times a day (QID) | INTRAVENOUS | Status: AC
Start: 2015-10-18 — End: 2015-10-19
  Administered 2015-10-18 – 2015-10-19 (×3): 1 g via INTRAVENOUS
  Filled 2015-10-18 (×3): qty 50

## 2015-10-18 MED ORDER — MIDAZOLAM HCL 5 MG/5ML IJ SOLN
INTRAMUSCULAR | Status: AC
  Filled 2015-10-18: qty 5

## 2015-10-18 MED ORDER — LIDOCAINE HCL (PF) 1 % IJ SOLN
INTRAMUSCULAR | Status: AC
  Filled 2015-10-18: qty 60

## 2015-10-18 MED ORDER — HEPARIN (PORCINE) IN NACL 2-0.9 UNIT/ML-% IJ SOLN
INTRAMUSCULAR | Status: AC
  Filled 2015-10-18: qty 500

## 2015-10-18 MED ORDER — LIDOCAINE HCL (PF) 1 % IJ SOLN
INTRAMUSCULAR | Status: AC
  Filled 2015-10-18: qty 30

## 2015-10-18 MED ORDER — ACETAMINOPHEN 325 MG PO TABS
325.0000 mg | ORAL_TABLET | ORAL | Status: DC | PRN
  Administered 2015-10-18: 650 mg via ORAL
  Filled 2015-10-18: qty 2

## 2015-10-18 MED ORDER — MUPIROCIN 2 % EX OINT
TOPICAL_OINTMENT | CUTANEOUS | Status: AC
  Administered 2015-10-18: 1
  Filled 2015-10-18: qty 22

## 2015-10-18 MED ORDER — FENTANYL CITRATE (PF) 100 MCG/2ML IJ SOLN
INTRAMUSCULAR | Status: DC | PRN
  Administered 2015-10-18: 12.5 ug via INTRAVENOUS
  Administered 2015-10-18: 25 ug via INTRAVENOUS
  Administered 2015-10-18: 12.5 ug via INTRAVENOUS
  Administered 2015-10-18 (×2): 25 ug via INTRAVENOUS
  Administered 2015-10-18 (×2): 12.5 ug via INTRAVENOUS

## 2015-10-18 MED ORDER — MUPIROCIN 2 % EX OINT: 1.0000 "application " | TOPICAL_OINTMENT | Freq: Once | CUTANEOUS | Status: DC

## 2015-10-18 MED ORDER — MIDAZOLAM HCL 5 MG/5ML IJ SOLN
INTRAMUSCULAR | Status: DC | PRN
  Administered 2015-10-18 (×7): 1 mg via INTRAVENOUS

## 2015-10-18 MED ORDER — HEPARIN (PORCINE) IN NACL 2-0.9 UNIT/ML-% IJ SOLN
INTRAMUSCULAR | Status: DC | PRN
  Administered 2015-10-18 (×2)

## 2015-10-18 MED ORDER — CHLORHEXIDINE GLUCONATE 4 % EX LIQD
60.0000 mL | Freq: Once | CUTANEOUS | Status: DC
  Filled 2015-10-18: qty 60

## 2015-10-18 MED ORDER — CEFAZOLIN SODIUM-DEXTROSE 2-4 GM/100ML-% IV SOLN
INTRAVENOUS | Status: AC
  Filled 2015-10-18: qty 100

## 2015-10-18 MED ORDER — FENTANYL CITRATE (PF) 100 MCG/2ML IJ SOLN
INTRAMUSCULAR | Status: AC
  Filled 2015-10-18: qty 2

## 2015-10-18 MED ORDER — CEFAZOLIN SODIUM-DEXTROSE 2-4 GM/100ML-% IV SOLN
2.0000 g | INTRAVENOUS | Status: AC
  Administered 2015-10-18: 2 g via INTRAVENOUS

## 2015-10-18 MED ORDER — SODIUM CHLORIDE 0.9 % IR SOLN
Status: AC
  Filled 2015-10-18: qty 2

## 2015-10-18 MED ORDER — ONDANSETRON HCL 4 MG/2ML IJ SOLN: 4.0000 mg | Freq: Four times a day (QID) | INTRAMUSCULAR | Status: DC | PRN

## 2015-10-18 MED ORDER — CARVEDILOL 3.125 MG PO TABS
3.1250 mg | ORAL_TABLET | Freq: Two times a day (BID) | ORAL | Status: DC
  Administered 2015-10-19: 3.125 mg via ORAL
  Filled 2015-10-18: qty 1

## 2015-10-18 MED ORDER — IOPAMIDOL (ISOVUE-370) INJECTION 76%
INTRAVENOUS | Status: DC | PRN
  Administered 2015-10-18: 35 mL via INTRAVENOUS

## 2015-10-18 MED ORDER — SODIUM CHLORIDE 0.9 % IV SOLN
INTRAVENOUS | Status: DC
  Administered 2015-10-18: 14:00:00 via INTRAVENOUS

## 2015-10-18 MED ORDER — SODIUM CHLORIDE 0.9 % IR SOLN
80.0000 mg | Status: AC
  Administered 2015-10-18: 80 mg

## 2015-10-18 MED ORDER — IOPAMIDOL (ISOVUE-370) INJECTION 76%
INTRAVENOUS | Status: AC
  Filled 2015-10-18: qty 50

## 2015-10-18 MED ORDER — LIDOCAINE HCL (PF) 1 % IJ SOLN
INTRAMUSCULAR | Status: DC | PRN
  Administered 2015-10-18: 80 mL via INTRADERMAL

## 2015-10-18 SURGICAL SUPPLY — 22 items
ADAPTER SEALING SSSA-09 (MISCELLANEOUS) ×2 IMPLANT
BALLN ATTAIN 80 (BALLOONS) ×2
BALLOON ATTAIN 80 (BALLOONS) ×1 IMPLANT
CABLE SURGICAL S-101-97-12 (CABLE) ×4 IMPLANT
CATH ATTAIN COM SURV 6250V-MB2 (CATHETERS) ×2 IMPLANT
CATH CPS DIRECT 135 DS2C020 (CATHETERS) ×2 IMPLANT
DEVICE CRTP PERCEPTA QUAD MRI (Pacemaker) ×2 IMPLANT
ELECT DEFIB PAD ADLT CADENCE (PAD) ×2 IMPLANT
HEMOSTAT SURGICEL 2X4 FIBR (HEMOSTASIS) ×2 IMPLANT
KIT ESSENTIALS PG (KITS) ×2 IMPLANT
LEAD CAPSURE NOVUS 5076-52CM (Lead) ×2 IMPLANT
LEAD CAPSURE NOVUS 5076-58CM (Lead) ×2 IMPLANT
LEAD QUARTET 1458Q-86CM (Lead) ×2 IMPLANT
PAD DEFIB LIFELINK (PAD) ×2 IMPLANT
SHEATH CLASSIC 7F (SHEATH) ×4 IMPLANT
SHEATH CLASSIC 9.5F (SHEATH) ×2 IMPLANT
SHIELD RADPAD SCOOP 12X17 (MISCELLANEOUS) ×2 IMPLANT
SLITTER 6232ADJ (MISCELLANEOUS) ×2 IMPLANT
SLITTER UNIVERSAL DS2A003 (MISCELLANEOUS) IMPLANT
TRAY PACEMAKER INSERTION (PACKS) ×2 IMPLANT
WIRE ACUITY WHISPER EDS 4648 (WIRE) ×6 IMPLANT
WIRE HI TORQ VERSACORE-J 145CM (WIRE) ×2 IMPLANT

## 2015-10-18 NOTE — Telephone Encounter (Signed)
Made in error

## 2015-10-18 NOTE — H&P (Signed)
ELECTROPHYSIOLOGY HISTORY AND PHYSICAL     Patient ID: George [name] MRN: [id number], DOB/AGE: May 08, 1950 65 y.o.  Admit date: 10/18/2015  Primary Physician: Mellody Dance, DO Primary Cardiologist: Felicie Morn MD: Radford Pax  Reason for Consultation: heart block, LBBB  HPI:  George [name] is a 65 y.o. male with a past medical history significant for LBBB, hypertension, and newly diagnosed 2:1 heart block.  He has had progressive fatigue over the last several days and was seen by his PCP who ordered a 48 hour monitor but before this was done, he was seen by Dr Radford Pax in the office yesterday.  EKG at that time demonstrated 2:1 heart block.  He is on no AVN blocking agents. Recent labwork was unremarkable without reversible causes for heart block identified.  EP has been asked to evaluate for treatment options.  He currently states that he has fatigue and shortness of breath with exertion, he denies chest pain, recent LE edema, dizziness, pre-syncope, or syncope. He has not had recent fevers or chills.  Echo 10/16/15 demonstrated EF 45-50%, mild diffuse hypokinesis, mild AR, mild MR.   Past Medical History:  Diagnosis Date  . Arthritis THUMBS  . LBBB (left bundle branch block)   . Peyronie's disease      Surgical History:  Past Surgical History:  Procedure Laterality Date  . CARDIOVASCULAR STRESS TEST  10-18-2002  DR  CRENSHAW   NORMAL ADENOSINE CARDIOLITE/ EF 60%  . CATARACT EXTRACTION W/ INTRAOCULAR LENS IMPLANT  2006   LEFT EYE  . LAPAROSCOPIC CHOLECYSTECTOMY  10-09-2007  . NESBIT PROCEDURE  01/17/2012   Procedure: NESBIT PROCEDURE;  Surgeon: [name], MD;  Location: [facility];  Service: Urology;  Laterality: N/A;  16 dot plication   . Reading, LEFT EYE  1993  . REPAIR LEFT INGUINAL HERNIA  2005      Allergies: No Known Allergies  Social History   Social History  . Marital status: Married    Spouse name: N/A  . Number of  children: N/A  . Years of education: N/A   Occupational History  . Not on file.   Social History Main Topics  . Smoking status: Never Smoker  . Smokeless tobacco: Never Used  . Alcohol use No  . Drug use: No  . Sexual activity: Yes   Other Topics Concern  . Not on file   Social History Narrative  . No narrative on file     Family History  Problem Relation Age of Onset  . Diabetes Mother   . Hypertension Mother   . Hyperlipidemia Mother   . Heart disease Father   . Hyperlipidemia Father   . Hypertension Father   . Arrhythmia Father     paf  . Healthy Daughter   . Healthy Son   . Heart disease Paternal Grandfather   . Heart attack Paternal Grandfather      Review of Systems: All other systems reviewed and are otherwise negative except as noted above.  Physical Exam: Vitals:   10/18/15 1313  BP: (!) 218/80  Pulse: (!) 34  Resp: 18  Temp: 98 F (36.7 C)  SpO2: 100%  Weight: 155 lb (70.3 kg)  Height: 5' 6.25" (1.683 m)    GEN- The patient is well appearing, alert and oriented x 3 today.   HEENT: normocephalic, atraumatic; sclera clear, conjunctiva pink; hearing intact; oropharynx clear; neck supple  Lungs- Clear to ausculation bilaterally, normal work of breathing.  No wheezes,  rales, rhonchi Heart- Bradycardic regular rate and rhythm  GI- soft, non-tender, non-distended, bowel sounds present  Extremities- no clubbing, cyanosis, or edema; DP/PT/radial pulses 2+ bilaterally MS- no significant deformity or atrophy Skin- warm and dry, no rash or lesion Psych- euthymic mood, full affect Neuro- strength and sensation are intact  Labs:   Lab Results  Component Value Date   WBC 8.0 10/16/2015   HGB 14.6 10/16/2015   HCT 41.3 10/16/2015   MCV 87.7 10/16/2015   PLT 198 10/16/2015     Recent Labs Lab 10/16/15 1635  NA 139  K 4.1  CL 102  CO2 27  BUN 12  CREATININE 0.87  CALCIUM 9.6  GLUCOSE 83      Radiology/Studies: Dg Chest 2 View Result  Date: 09/29/2015 CLINICAL DATA:  Dyspnea on exertion for the past 2-3 months. Upper respiratory infection symptoms. EXAM: CHEST  2 VIEW COMPARISON:  10/09/2007. FINDINGS: Normal sized heart. Tortuous aorta. Clear lungs. Mild thoracic spine degenerative changes. Cholecystectomy clips. IMPRESSION: No acute abnormality. Electronically Signed   By: Claudie Revering M.D.   On: 09/29/2015 11:48    EKG:2:1 heart block, LBBB, rate 36  Assessment/Plan: 1.  Symptomatic 2:1 heart block The patient has symptomatic 2:1 heart block without reversible causes identified. With LBBB and LV dysfunction, CRTP is recommended at this time. Risks, benefits to procedure were reviewed with the patient who wishes to proceed.  2.  LV dysfunction Will plan on medication optimization once PPM in place  3.  HTN Elevated  Will start BB after PPM implanted.    Signed, Chanetta Marshall, NP 10/18/2015 2:23 PM  Pt seen and interviewed Examined  For CRTP  The benefits and risks were reviewed including but not limited to death,  perforation, infection, lead dislodgement and device malfunction.  The patient understands agrees and is willing to proceed.

## 2015-10-19 ENCOUNTER — Encounter (HOSPITAL_COMMUNITY): Payer: Self-pay | Admitting: Internal Medicine

## 2015-10-19 ENCOUNTER — Ambulatory Visit (HOSPITAL_COMMUNITY): Payer: BLUE CROSS/BLUE SHIELD

## 2015-10-19 DIAGNOSIS — I452 Bifascicular block: Secondary | ICD-10-CM | POA: Diagnosis not present

## 2015-10-19 DIAGNOSIS — I10 Essential (primary) hypertension: Secondary | ICD-10-CM | POA: Diagnosis not present

## 2015-10-19 DIAGNOSIS — I442 Atrioventricular block, complete: Secondary | ICD-10-CM

## 2015-10-19 DIAGNOSIS — I429 Cardiomyopathy, unspecified: Secondary | ICD-10-CM | POA: Diagnosis not present

## 2015-10-19 DIAGNOSIS — I447 Left bundle-branch block, unspecified: Secondary | ICD-10-CM | POA: Diagnosis not present

## 2015-10-19 MED ORDER — CARVEDILOL 3.125 MG PO TABS: 3.1250 mg | ORAL_TABLET | Freq: Two times a day (BID) | ORAL | 2 refills | Status: DC

## 2015-10-19 MED FILL — Heparin Sodium (Porcine) 2 Unit/ML in Sodium Chloride 0.9%: INTRAMUSCULAR | Qty: 500 | Status: AC

## 2015-10-19 NOTE — Discharge Instructions (Signed)
° ° °  Supplemental Discharge Instructions for  Pacemaker/Defibrillator Patients  Activity No heavy lifting or vigorous activity with your left/right arm for 6 to 8 weeks.  Do not raise your left/right arm above your head for one week.  Gradually raise your affected arm as drawn below.           __          10/23/15                     10/24/15                        10/25/15                        10/26/15  NO DRIVING for   1 week  ; you may begin driving on   [id number]  .  WOUND CARE - Keep the wound area clean and dry.   - The tape/steri-strips on your wound will fall off; do not pull them off.  No bandage is needed on the site.  DO  NOT apply any creams, oils, or ointments to the wound area. - If you notice any drainage or discharge from the wound, any swelling or bruising at the site, or you develop a fever > 101? F after you are discharged home, call the office at once.  Special Instructions - You are still able to use cellular telephones; use the ear opposite the side where you have your pacemaker/defibrillator.  Avoid carrying your cellular phone near your device. - When traveling through airports, show security personnel your identification card to avoid being screened in the metal detectors.  Ask the security personnel to use the hand wand. - Avoid arc welding equipment, TENS units (transcutaneous nerve stimulators).  Call the office for questions about other devices. - Avoid electrical appliances that are in poor condition or are not properly grounded. - Microwave ovens are safe to be near or to operate.

## 2015-10-19 NOTE — Op Note (Signed)
NAME:  George [name], George [name] NO.:  [id number]  MEDICAL RECORD NO.:  [id number]  LOCATION:  2H13C                        FACILITY:  [facility]  PHYSICIAN:  [name], MD, FACCDATE OF BIRTH:  02/19/1951  DATE OF PROCEDURE:  10/18/2015 DATE OF DISCHARGE:                              OPERATIVE REPORT   PREOPERATIVE DIAGNOSIS:  Complete heart block with bilateral bundle- branch block and symptomatic bradycardia and cardiomyopathy.  POSTOPERATIVE DIAGNOSIS:  Complete heart block with bilateral bundle- branch block and symptomatic bradycardia and cardiomyopathy.  PROCEDURE:  Dual-chamber pacemaker implantation with left ventricular lead placement.  DESCRIPTION OF PROCEDURE:  Following obtaining informed consent, the patient was brought to electrophysiology laboratory and placed on the fluoroscopic table in supine position.  After routine prep and drape of the left upper chest, lidocaine was infiltrated in the prepectoral subclavicular region.  Incision was made and carried down to layer of the prepectoral fascia using electrocautery and sharp dissection.  It was elected to proceed expeditiously with RV lead securing as his heart rate had gone from the 30s to 20s with increased degree of block 2:1- 3:1.  A single venipuncture was accomplished.  An 8-French sheath was placed and a Medtronic 5076, 58 cm active fixation MRI lead serial number [id number] was advanced to the right ventricular apex where the sensed R-wave was 5.6 with a pace impedance of 1096 with threshold 1 V at 0.5 milliseconds.  Current threshold was 1.0.  No diaphragmatic pacing 10 V, and the current of injury was brisk.  This lead was secured to the prepectoral fascia.  At this point, we gained access with 2 separate venipunctures.  A 9.5-French sheath was placed and a St. Jude 135 CS cannulation sheath was utilized which allowed for ready access to the coronary sinus.  Nonocclusive venogram demonstrated a  large posterolateral branch which was targeted easily and a St. Jude 1458 Q lead BPP [id number] was advanced to the distal ramification of the lead where its tip was within in the apex.  Using poles 2 and 3 and 4, we were unable to find a configuration that was not associated with diaphragmatic stimulation.  It was elected then to map more superiorly. A high lateral branch was identified.  Unfortunately, the lead could not be passed through this vein.  We then sought to obtain an occlusive venogram.  However, because of the fullness of the sheath and the angle of the coronary sinus access, we kept losing our distal access and in fact coronary sinus access.  I ended up having it coil at the coronary sinus 4 or 5 times.  Ultimately, we took a high venogram having replaced the CS sheath with a Medtronic MB2 sheet.  A mid lateral branch was identified.  We were able to pass the wire, and the lead until the distal portion we are the at the junction between the mid and distal third.  In 2-3 configuration, the RL wave was 4.5 with impedance of 976 and threshold was quickly measured less than 3 V at 0.5 milliseconds.  This deployment system was removed. The lead was secured to the prepectoral fascia, and we then placed a 7- [place] sheath,  and through this, passed a Medtronic 5076, 52 cm active fixation atrial lead, serial number [id number].  This lead was also MRI conditional.  This lead was secured to the right atrial appendage where bipolar P-wave was 1.4 with a pace impedance of 736, a threshold of 2 V at 0.5 millisecond shortly after screw deployment.  The current of injury was brisk and there is no diaphragmatic pacing at 10 V.  This lead was secured to the prepectoral fascia.  We then had to make the pocket.  Local anesthesia was used.  The pocket was formed and the leads were then attached to a Medtronic [id number] pulse generator, serial number [id number].  RV pacing and a biventricular pacing  were identified.  The leads in the pulse generator were placed in the pocket, secured to the prepectoral fascia bleeding having been controlled and the pocket having been copiously irrigated with antibiotic-containing saline solution.  Surgicel was placed in the pocket.  The leads and pulse generator were secured, and the wound was closed in 3 layers in normal fashion.  The wound was washed, dried, and a Dermabond dressing was applied.  Needle counts, sponge counts, and instrument counts were correct at the end of the procedure according to the staff.  We then placed a pressure dressing.  The patient tolerated the procedure without apparent complication.     [name], MD, [facility]     SCK/MEDQ  D:  10/18/2015  T:  10/18/2015  Job:  [id number]

## 2015-10-19 NOTE — Discharge Summary (Signed)
ELECTROPHYSIOLOGY PROCEDURE DISCHARGE SUMMARY    Patient ID: George [name],  MRN: [id number], DOB/AGE: 1950/10/21 65 y.o.  Admit date: 10/18/2015 Discharge date: 10/19/2015  Primary Care Physician: George Dance, DO Primary Cardiologist: George [name] Electrophysiologist: George [name]  Primary Discharge Diagnosis:  Symptomatic 2:1 heart block and LV dysfunction status post pacemaker implantation this admission  Secondary Discharge Diagnosis:  1.  LBBB 2.  HTN  No Known Allergies   Procedures This Admission:  1.  Implantation of a MDT CRTP on 10/18/15 by Dr George [name].  See op note for full details. There were no immediate post procedure complications. 2.  CXR on 10/19/15 demonstrated no pneumothorax status post device implantation.   Brief HPI: George [name] is a 65 y.o. male was referred to electrophysiology in the outpatient setting for consideration of PPM implantation.  Past medical history includes symptomatic 2:1 heart block, LBBB, hypertension.  The patient has had symptomatic bradycardia without reversible causes identified.  Risks, benefits, and alternatives to PPM implantation were reviewed with the patient who wished to proceed.   Hospital Course:  The patient was admitted and underwent implantation of a MDT CRTP with details as outlined above.  He  was monitored on telemetry overnight which demonstrated sinus rhythm with ventricular pacing.  Left chest was without hematoma or ecchymosis.  The device was interrogated and found to be functioning normally.  CXR was obtained and demonstrated no pneumothorax status post device implantation.  Wound care, arm mobility, and restrictions were reviewed with the patient.  The patient was examined and considered stable for discharge to home.    Physical Exam: Vitals:   10/19/15 0500 10/19/15 0600 10/19/15 0754 10/19/15 0800  BP: 140/78 (!) 147/80 (!) 142/97 (!) 155/86  Pulse: (!) 50 (!) 52 72 73  Resp: 17 12 17 20   Temp:   98.4 F (36.9 C)     TempSrc:   Oral   SpO2: 96% 95% 97% 96%  Weight:      Height:        GEN- The patient is well appearing, alert and oriented x 3 today.   HEENT: normocephalic, atraumatic; sclera clear, conjunctiva pink; hearing intact; oropharynx clear; neck supple  Lungs- Clear to ausculation bilaterally, normal work of breathing.  No wheezes, rales, rhonchi Heart- Regular rate and rhythm  GI- soft, non-tender, non-distended, bowel sounds present  Extremities- no clubbing, cyanosis, or edema; DP/PT/radial pulses 2+ bilaterally MS- no significant deformity or atrophy Skin- warm and dry, no rash or lesion, left chest without hematoma/ecchymosis Psych- euthymic mood, full affect Neuro- strength and sensation are intact   Labs:   Lab Results  Component Value Date   WBC 8.0 10/16/2015   HGB 14.6 10/16/2015   HCT 41.3 10/16/2015   MCV 87.7 10/16/2015   PLT 198 10/16/2015     Recent Labs Lab 10/16/15 1635  NA 139  K 4.1  CL 102  CO2 27  BUN 12  CREATININE 0.87  CALCIUM 9.6  GLUCOSE 83    Discharge Medications:    Medication List    TAKE these medications   carvedilol 3.125 MG tablet Commonly known as:  COREG Take 1 tablet (3.125 mg total) by mouth 2 (two) times daily with a meal.       Disposition:  Discharge Instructions    Diet - low sodium heart healthy    Complete by:  As directed   Increase activity slowly    Complete by:  As directed  Follow-up Information    [facility] Follow up on 10/30/2015.   Specialty:  Cardiology Why:  at [facility] for wound check  Contact information: [address] [place]       George Axe, MD Follow up on 02/02/2016.   Specialty:  Cardiology Why:  at 9:30AM  Contact information: [address] [place] [address] [phone]           Duration of Discharge Encounter: Greater than 30 minutes including physician  time.  Signed, George Marshall, NP 10/19/2015 8:38 AM

## 2015-10-30 ENCOUNTER — Ambulatory Visit
Admission: RE | Admit: 2015-10-30 | Discharge: 2015-10-30 | Disposition: A | Discharge status: Home / Self Care | Payer: BLUE CROSS/BLUE SHIELD | Source: Ambulatory Visit | Attending: Internal Medicine | Admitting: Internal Medicine

## 2015-10-30 ENCOUNTER — Encounter: Payer: Self-pay | Admitting: Internal Medicine

## 2015-10-30 ENCOUNTER — Ambulatory Visit (INDEPENDENT_AMBULATORY_CARE_PROVIDER_SITE_OTHER): Payer: BLUE CROSS/BLUE SHIELD | Admitting: *Deleted

## 2015-10-30 DIAGNOSIS — Z950 Presence of cardiac pacemaker: Secondary | ICD-10-CM | POA: Diagnosis not present

## 2015-10-30 LAB — CUP PACEART INCLINIC DEVICE CHECK
Brady Statistic RA Percent Paced: 13.1 %
Implantable Lead Implant Date: 20170802
Implantable Lead Location: 753860
Lead Channel Impedance Value: 551 Ohm
Lead Channel Pacing Threshold Amplitude: 2.75 V
Lead Channel Sensing Intrinsic Amplitude: 3.4 mV
Lead Channel Setting Pacing Amplitude: 3.5 V
Lead Channel Setting Pacing Amplitude: 3.5 V
Lead Channel Setting Pacing Amplitude: 6 V
Lead Channel Setting Pacing Pulse Width: 0.8 ms
MDC IDC LEAD IMPLANT DT: 20170802
MDC IDC LEAD IMPLANT DT: 20170802
MDC IDC LEAD LOCATION: 753858
MDC IDC LEAD LOCATION: 753859
MDC IDC MSMT LEADCHNL LV PACING THRESHOLD PULSEWIDTH: 0.8 ms
MDC IDC MSMT LEADCHNL RA IMPEDANCE VALUE: 437 Ohm
MDC IDC MSMT LEADCHNL RA PACING THRESHOLD AMPLITUDE: 0.75 V
MDC IDC MSMT LEADCHNL RA PACING THRESHOLD PULSEWIDTH: 0.4 ms
MDC IDC MSMT LEADCHNL RV IMPEDANCE VALUE: 494 Ohm
MDC IDC MSMT LEADCHNL RV PACING THRESHOLD AMPLITUDE: 1.25 V
MDC IDC MSMT LEADCHNL RV PACING THRESHOLD PULSEWIDTH: 0.4 ms
MDC IDC SESS DTM: 20170814152202
MDC IDC SET LEADCHNL RV PACING PULSEWIDTH: 0.4 ms

## 2015-10-30 NOTE — Patient Instructions (Signed)
[employer], [place]

## 2015-10-30 NOTE — Progress Notes (Signed)
Wound check appointment with industry present. Dermabond removed. Wound without redness or edema. Incision edges approximated, wound well healed. Normal device function. RA and RV thresholds, sensing, and impedances consistent with implant measurements. LV threshold (LV2-LV3) elevated at 4.25V @ 0.25ms or 2.75V @ 0.31ms. Ran VectorExpress and ordered CXR, will review results with SK for any reprogramming recommendations. Device programmed at 3.5V (RA, RV) and 6.0V @ 0.6ms (LV) with auto capture programmed on for extra safety margin until 3 month visit. Histogram distribution appropriate for patient and level of activity. BiV pacing 93.6% (effective 93.5%). No mode switches or high ventricular rates noted. Patient educated about wound care, arm mobility, lifting restrictions. ROV with SK on 02/02/16.

## 2015-11-02 ENCOUNTER — Ambulatory Visit: Payer: BLUE CROSS/BLUE SHIELD | Admitting: Cardiovascular Disease

## 2015-11-03 ENCOUNTER — Telehealth: Payer: Self-pay | Admitting: *Deleted

## 2015-11-03 NOTE — Telephone Encounter (Signed)
[facility] requesting call back.  Gave device clinic phone number for return call.   Able to reach patient on cell phone.  Patient is agreeable to a device clinic appointment on 11/16/15 at 12:00pm for LV vector reprogramming per Dr. Olin Pia recommendation (based on recent CXR).  Patient is appreciative of call and denies questions or concerns at this time.

## 2015-11-08 ENCOUNTER — Ambulatory Visit (INDEPENDENT_AMBULATORY_CARE_PROVIDER_SITE_OTHER): Payer: BLUE CROSS/BLUE SHIELD | Admitting: *Deleted

## 2015-11-08 ENCOUNTER — Encounter: Payer: Self-pay | Admitting: Internal Medicine

## 2015-11-08 ENCOUNTER — Encounter (INDEPENDENT_AMBULATORY_CARE_PROVIDER_SITE_OTHER): Payer: Self-pay

## 2015-11-08 DIAGNOSIS — Z950 Presence of cardiac pacemaker: Secondary | ICD-10-CM

## 2015-11-08 LAB — CUP PACEART INCLINIC DEVICE CHECK
Date Time Interrogation Session: 20170823141033
Implantable Lead Implant Date: 20170802
Implantable Lead Implant Date: 20170802
Implantable Lead Location: 753858
Implantable Lead Location: 753859
Implantable Lead Model: 5076
Lead Channel Pacing Threshold Amplitude: 1 V
Lead Channel Setting Pacing Amplitude: 3.5 V
Lead Channel Setting Pacing Amplitude: 3.5 V
Lead Channel Setting Pacing Pulse Width: 0.4 ms
MDC IDC LEAD IMPLANT DT: 20170802
MDC IDC LEAD LOCATION: 753860
MDC IDC MSMT LEADCHNL RV PACING THRESHOLD PULSEWIDTH: 0.8 ms
MDC IDC SET LEADCHNL LV PACING AMPLITUDE: 2 V
MDC IDC SET LEADCHNL LV PACING PULSEWIDTH: 0.8 ms
MDC IDC STAT BRADY RA PERCENT PACED: 7 %

## 2015-11-08 NOTE — Progress Notes (Signed)
CRT-D device check in office for LV vector reprogramming. Per SK, reprogram to LV1-LV2 based on CXR and Vector Express. No diaphragmatic stim noted at 5V. Reprogrammed to LV1-LV2, output to 2.0V @ 0.73ms (adaptive on). ROV with SK on 02/02/16.

## 2015-12-20 ENCOUNTER — Telehealth: Payer: Self-pay | Admitting: Cardiology

## 2015-12-20 NOTE — Telephone Encounter (Signed)
Our office has called pt several times to schedule holter monitor. No answer and no call backs. I have removed order from [employer]que.. Please see notes below.  11/06/15 LMOM for pt to call and schedule monitor appt. if pt is not interested in getting the monitor I ask that he call to inform us so we could let ordering provider know. stpegram 10/23/15 LMOM TO CALL AND SCHEDULE/D.MILLER 10/16/15 LMOM TO CALL OUR OFFICE/D.MILLER 10/09/15 LMOM TO CALL AND SCHED/D.MILLER  Sonya

## 2016-01-01 ENCOUNTER — Emergency Department (HOSPITAL_COMMUNITY)
Admission: EM | Admit: 2016-01-01 | Discharge: 2016-01-02 | Disposition: A | Discharge status: Home / Self Care | Payer: BLUE CROSS/BLUE SHIELD | Attending: Emergency Medicine | Admitting: Emergency Medicine

## 2016-01-01 ENCOUNTER — Encounter (HOSPITAL_COMMUNITY): Payer: Self-pay | Admitting: *Deleted

## 2016-01-01 DIAGNOSIS — R42 Dizziness and giddiness: Secondary | ICD-10-CM | POA: Diagnosis present

## 2016-01-01 DIAGNOSIS — I10 Essential (primary) hypertension: Secondary | ICD-10-CM | POA: Insufficient documentation

## 2016-01-01 NOTE — ED Provider Notes (Addendum)
[facility] DEPT Provider Note   CSN: [id number] Arrival date & time: 01/01/16  2324   By signing my name below, I, George [name], attest that this documentation has been prepared under the direction and in the presence of [name], DO . Electronically Signed: Neta [name], ED Scribe. 01/01/2016. 11:56 PM.   History   Chief Complaint Chief Complaint  Patient presents with  . Hypertension    The history is provided by the patient. No language interpreter was used.   HPI Comments:  George [name] is a 65 y.o. male with PMHx of HTN who presents to the Emergency Department complaining of a new episode of dizziness/lightheadedness x 2 hours. Pt states that he was taking a nap this evening and woke up feeling dizzy, but now the dizziness has slightly improved. Pt describes the sensation as feeling " like his head was floating." Pt reports that he had a pacemaker placed and has not been napping in the evening as he normally does, but did take a nap today. Pt states that his dizziness is exacerbated when taking deep breaths. Pt reports that he measured his BP with 2 machines at home and found himself to be hypertensive. No alleviating factors noted. Pt denies any previous history of syncope. Pt denies chest pain, SOB, leg swelling.   Past Medical History:  Diagnosis Date  . Arthritis THUMBS  . LBBB (left bundle branch block)   . Peyronie's disease     Patient Active Problem List   Diagnosis Date Noted  . BBBB (bilateral bundle branch block) 10/18/2015  . Complete heart block ([place]) 10/18/2015  . Vitamin B12 deficiency 10/06/2015  . Vitamin D insufficiency 10/06/2015  . Bradycardia 10/06/2015  . Hereditary hyperbilirubinemia 10/06/2015  . H/O noncompliance with medical treatment, presenting hazards to health 09/19/2015  . Peyronie's disease 09/19/2015  . DOE (dyspnea on exertion) 09/19/2015  . Unexplained weight loss 09/14/2015  . Essential hypertension 09/14/2015  .  h/o Non-compliance: Has not seen a doc in over 13 years 09/14/2015  . Fatigue 09/14/2015    Past Surgical History:  Procedure Laterality Date  . CARDIOVASCULAR STRESS TEST  10-18-2002  DR  CRENSHAW   NORMAL ADENOSINE CARDIOLITE/ EF 60%  . CATARACT EXTRACTION W/ INTRAOCULAR LENS IMPLANT  2006   LEFT EYE  . EP IMPLANTABLE DEVICE N/A 10/18/2015   Procedure: BiV Pacemaker Insertion CRT-P;  Surgeon: [name], MD;  Location: [facility];  Service: Cardiovascular;  Laterality: N/A;  . LAPAROSCOPIC CHOLECYSTECTOMY  10-09-2007  . NESBIT PROCEDURE  01/17/2012   Procedure: NESBIT PROCEDURE;  Surgeon: [name], MD;  Location: [facility];  Service: Urology;  Laterality: N/A;  16 dot plication   . [place], LEFT EYE  1993  . REPAIR LEFT INGUINAL HERNIA  2005       Home Medications    Prior to Admission medications   Medication Sig Start Date End Date Taking? Authorizing Provider  carvedilol (COREG) 3.125 MG tablet Take 1 tablet (3.125 mg total) by mouth 2 (two) times daily with a meal. 10/19/15   [name], NP    Family History Family History  Problem Relation Age of Onset  . Diabetes Mother   . Hypertension Mother   . Hyperlipidemia Mother   . Heart disease Father   . Hyperlipidemia Father   . Hypertension Father   . Arrhythmia Father     paf  . Healthy Daughter   . Healthy Son   .  Heart disease Paternal Grandfather   . Heart attack Paternal Grandfather     Social History Social History  Substance Use Topics  . Smoking status: Never Smoker  . Smokeless tobacco: Never Used  . Alcohol use No     Allergies   Review of patient's allergies indicates no known allergies.   Review of Systems Review of Systems  Constitutional: Negative for chills and fever.  HENT: Negative for congestion and facial swelling.   Eyes: Negative for discharge and visual disturbance.  Respiratory: Negative for shortness of breath.     Cardiovascular: Negative for chest pain, palpitations and leg swelling.  Gastrointestinal: Negative for abdominal pain, diarrhea and vomiting.  Musculoskeletal: Negative for arthralgias and myalgias.  Skin: Negative for color change and rash.  Neurological: Positive for dizziness and light-headedness. Negative for tremors, syncope and headaches.  Psychiatric/Behavioral: Negative for confusion and dysphoric mood.  All other systems reviewed and are negative.    Physical Exam Updated Vital Signs BP (!) 159/102   Pulse (!) 56   Temp 98 F (36.7 C) (Oral)   Resp 21   Ht 5\' 8"  (1.727 m)   Wt 156 lb 4.8 oz (70.9 kg)   SpO2 98%   BMI 23.77 kg/m   Physical Exam  Constitutional: He is oriented to person, place, and time. He appears well-developed and well-nourished.  HENT:  Head: Normocephalic and atraumatic.  Eyes: EOM are normal. Pupils are equal, round, and reactive to light.  Neck: Normal range of motion. Neck supple. No JVD present.  Cardiovascular: Normal rate and regular rhythm.  Exam reveals no gallop and no friction rub.   No murmur heard. Pulmonary/Chest: No respiratory distress. He has no wheezes.  Abdominal: He exhibits no distension. There is no rebound and no guarding.  Musculoskeletal: Normal range of motion.  Neurological: He is alert and oriented to person, place, and time. He has normal strength. No cranial nerve deficit or sensory deficit. He displays a negative Romberg sign. Coordination and gait normal. GCS eye subscore is 4. GCS verbal subscore is 5. GCS motor subscore is 6. He displays no Babinski's sign on the right side. He displays no Babinski's sign on the left side.  Reflex Scores:      Tricep reflexes are 2+ on the right side and 2+ on the left side.      Bicep reflexes are 2+ on the right side and 2+ on the left side.      Brachioradialis reflexes are 2+ on the right side and 2+ on the left side.      Patellar reflexes are 2+ on the right side and 2+ on  the left side.      Achilles reflexes are 2+ on the right side and 2+ on the left side. Skin: No rash noted. No pallor.  Psychiatric: He has a normal mood and affect. His behavior is normal.  Nursing note and vitals reviewed.    ED Treatments / Results  DIAGNOSTIC STUDIES:  Oxygen Saturation is 96% on RA, normal by my interpretation.    COORDINATION OF CARE:  11:56 PM Discussed treatment plan with pt at bedside and pt agreed to plan.   Labs (all labs ordered are listed, but only abnormal results are displayed) Labs Reviewed  CBC WITH DIFFERENTIAL/PLATELET - Abnormal; Notable for the following:       Result Value   Lymphs Abs 4.1 (*)    All other components within normal limits  COMPREHENSIVE METABOLIC PANEL - Abnormal; Notable for  the following:    Glucose, Bld 122 (*)    ALT 15 (*)    All other components within normal limits  I-STAT TROPOININ, ED    EKG  EKG Interpretation None     ED ECG REPORT   Date: 01/02/2016  Rate: 66  Rhythm: p waves with ventricularly paced rythym   QRS Axis: right  Intervals: normal  ST/T Wave abnormalities: nonspecific ST changes  Conduction Disutrbances:nonspecific intraventricular conduction delay  Narrative Interpretation:   Old EKG Reviewed: changes noted rate slower than prior  I have personally reviewed the EKG tracing and agree with the computerized printout as noted.   Radiology Dg Chest 2 View  Result Date: 01/02/2016 CLINICAL DATA:  Hypertension and dizziness. EXAM: CHEST  2 VIEW COMPARISON:  10/30/2015 FINDINGS: Right atrial, right ventricular and coronary sinus pacing leads are noted with left-sided pacemaker apparatus. Lungs are clear. Cardiac silhouette is within normal limits. The aorta is slightly ectatic in appearance without aneurysm. No suspicious osseous abnormalities. IMPRESSION: No active cardiopulmonary disease. Electronically Signed   By: Ashley Royalty M.D.   On: 01/02/2016 00:47    Procedures Procedures  (including critical care time)  Medications Ordered in ED Medications  sodium chloride 0.9 % bolus 500 mL (500 mLs Intravenous New Bag/Given 01/02/16 0022)     Initial Impression / Assessment and Plan / ED Course  I have reviewed the triage vital signs and the nursing notes.  Pertinent labs & imaging results that were available during my care of the patient were reviewed by me and considered in my medical decision making (see chart for details).  Clinical Course    65 yo M With a chief complaint of dizziness. Patient is unable to quantify this. It's worse with the deep breath. Nothing else seems to make this better or worse. By my exam patient states that he feels much better. He has a normal neurologic exam is able to ambulate without difficulty. ECG with no noted concerning findings. Some basic labs were drawn in triage. I'm unsure of the exact etiology of the patient's dizziness is also difficult to clarify whether this is presyncopal or vertiginous.  He however has no current symptoms with normal labs I will send him home with PCP follow-up.  12:52 AM:  I have discussed the diagnosis/risks/treatment options with the patient and family and believe the pt to be eligible for discharge home to follow-up with Cards. We also discussed returning to the ED immediately if new or worsening sx occur. We discussed the sx which are most concerning (e.g., sudden worsening pain, fever, inability to tolerate by mouth) that necessitate immediate return. Medications administered to the patient during their visit and any new prescriptions provided to the patient are listed below.  Medications given during this visit Medications  sodium chloride 0.9 % bolus 500 mL (500 mLs Intravenous New Bag/Given 01/02/16 0022)     The patient appears reasonably screen and/or stabilized for discharge and I doubt any other medical condition or other [facility] requiring further screening, evaluation, or treatment in the ED at  this time prior to discharge.   Final Clinical Impressions(s) / ED Diagnoses   Final diagnoses:  Dizziness    New Prescriptions New Prescriptions   No medications on file  I personally performed the services described in this documentation, which was scribed in my presence. The recorded information has been reviewed and is accurate.      [name], DO 01/02/16 [place], DO  01/02/16 0134  

## 2016-01-01 NOTE — ED Notes (Signed)
MD at bedside. 

## 2016-01-01 NOTE — ED Triage Notes (Signed)
Patient presents with c/o feeling dizzy and took his BP and realized it was elevated

## 2016-01-02 ENCOUNTER — Telehealth: Payer: Self-pay | Admitting: Internal Medicine

## 2016-01-02 ENCOUNTER — Emergency Department (HOSPITAL_COMMUNITY): Payer: BLUE CROSS/BLUE SHIELD

## 2016-01-02 LAB — CBC WITH DIFFERENTIAL/PLATELET
BASOS ABS: 0 10*3/uL (ref 0.0–0.1)
Basophils Relative: 0 %
EOS ABS: 0.3 10*3/uL (ref 0.0–0.7)
EOS PCT: 4 %
HCT: 41.1 % (ref 39.0–52.0)
HEMOGLOBIN: 14.3 g/dL (ref 13.0–17.0)
Lymphocytes Relative: 53 %
Lymphs Abs: 4.1 10*3/uL — ABNORMAL HIGH (ref 0.7–4.0)
MCH: 30 pg (ref 26.0–34.0)
MCHC: 34.8 g/dL (ref 30.0–36.0)
MCV: 86.3 fL (ref 78.0–100.0)
Monocytes Absolute: 0.6 10*3/uL (ref 0.1–1.0)
Monocytes Relative: 8 %
NEUTROS PCT: 35 %
Neutro Abs: 2.7 10*3/uL (ref 1.7–7.7)
PLATELETS: 175 10*3/uL (ref 150–400)
RBC: 4.76 MIL/uL (ref 4.22–5.81)
RDW: 12.7 % (ref 11.5–15.5)
WBC: 7.8 10*3/uL (ref 4.0–10.5)

## 2016-01-02 LAB — COMPREHENSIVE METABOLIC PANEL
ALBUMIN: 3.9 g/dL (ref 3.5–5.0)
ALT: 15 U/L — AB (ref 17–63)
AST: 25 U/L (ref 15–41)
Alkaline Phosphatase: 82 U/L (ref 38–126)
Anion gap: 9 (ref 5–15)
BUN: 11 mg/dL (ref 6–20)
CHLORIDE: 104 mmol/L (ref 101–111)
CO2: 25 mmol/L (ref 22–32)
CREATININE: 1.09 mg/dL (ref 0.61–1.24)
Calcium: 10 mg/dL (ref 8.9–10.3)
GFR calc non Af Amer: >60 mL/min (ref >60)
GLUCOSE: 122 mg/dL — AB (ref 65–99)
Potassium: 3.5 mmol/L (ref 3.5–5.1)
SODIUM: 138 mmol/L (ref 135–145)
Total Bilirubin: 1.1 mg/dL (ref 0.3–1.2)
Total Protein: 7.1 g/dL (ref 6.5–8.1)

## 2016-01-02 LAB — I-STAT TROPONIN, ED: Troponin i, poc: 0 ng/mL (ref 0.00–0.08)

## 2016-01-02 MED ORDER — SODIUM CHLORIDE 0.9 % IV BOLUS (SEPSIS)
500.0000 mL | Freq: Once | INTRAVENOUS | Status: AC
  Administered 2016-01-02: 500 mL via INTRAVENOUS

## 2016-01-02 NOTE — Telephone Encounter (Signed)
Not sure what this is about, but could be BP as suggested by SS If symptoms pesist should get seen Weakness is always concerning for infection in implanted patients Dr TT is his main cardiologyist or can see his PCP

## 2016-01-02 NOTE — ED Notes (Signed)
MD at bedside. 

## 2016-01-02 NOTE — Telephone Encounter (Signed)
Spoke to patient regarding the episode he had yesterday. Patient states that he had been taking a nap when all of a sudden he woke up feeling dizzy. Patient states that he took his BP and his systolic reading was between 177-180s, HR 50bpm (device base rate set at 50bpm). Patient states that he went to ER and was treated for dehydration.  I encouraged patient to keep a log of his BP readings and bring them to his upcoming appt in November.  Patient also states that he's noticed that he's been more fatigued over the last 4days. Patient denies any recent changes in his lifestyle or medications. Patient states that he does snore at night.  Will forward information to Dr.Klein and call patient with any further recommendations.

## 2016-01-02 NOTE — Telephone Encounter (Signed)
New Message  Pts wife voiced pt went to ED @ [facility] and returning call to see if pace maker needs to be checked.  Please f/u with pt

## 2016-01-02 NOTE — Discharge Instructions (Signed)
Call your cardiologist in the morning and discuss your high blood pressure.

## 2016-01-03 NOTE — Telephone Encounter (Signed)
Informed wife about Dr.Klein's recommendations. Wife voiced understanding.

## 2016-01-15 ENCOUNTER — Other Ambulatory Visit: Payer: Self-pay | Admitting: Cardiology

## 2016-01-15 MED ORDER — CARVEDILOL 3.125 MG PO TABS: 3.1250 mg | ORAL_TABLET | Freq: Two times a day (BID) | ORAL | 8 refills | Status: DC

## 2016-02-02 ENCOUNTER — Ambulatory Visit (INDEPENDENT_AMBULATORY_CARE_PROVIDER_SITE_OTHER): Payer: BLUE CROSS/BLUE SHIELD | Admitting: Internal Medicine

## 2016-02-02 ENCOUNTER — Telehealth: Payer: Self-pay | Admitting: Internal Medicine

## 2016-02-02 ENCOUNTER — Encounter: Payer: Self-pay | Admitting: Internal Medicine

## 2016-02-02 VITALS — BP 142/92 | HR 62 | Ht 68.0 in | Wt 155.0 lb

## 2016-02-02 DIAGNOSIS — R001 Bradycardia, unspecified: Secondary | ICD-10-CM | POA: Diagnosis not present

## 2016-02-02 DIAGNOSIS — Z950 Presence of cardiac pacemaker: Secondary | ICD-10-CM | POA: Diagnosis not present

## 2016-02-02 MED ORDER — CARVEDILOL 6.25 MG PO TABS: 6.2500 mg | ORAL_TABLET | Freq: Two times a day (BID) | ORAL | 6 refills | Status: DC

## 2016-02-02 NOTE — Telephone Encounter (Signed)
New message  Pt wants to know if he can use a magnet drill  Pt has pace maker

## 2016-02-02 NOTE — Progress Notes (Signed)
      Patient Care Team: Mellody Dance, DO as PCP - General (Family Medicine)   HPI  George [name] is a 65 y.o. male Seen in followup for CRT-P implanted 8/17 for complete heart block, bilateral bundle branch block and cardiomyopathy.  Exrercise  toelrance is improved  Able to climb stairs   Some palpitaion. s   Echo 7/17 EF 45-50%  Records and Results Reviewed   Past Medical History:  Diagnosis Date  . Arthritis THUMBS  . LBBB (left bundle branch block)   . Peyronie's disease     Past Surgical History:  Procedure Laterality Date  . CARDIOVASCULAR STRESS TEST  10-18-2002  DR  CRENSHAW   NORMAL ADENOSINE CARDIOLITE/ EF 60%  . CATARACT EXTRACTION W/ INTRAOCULAR LENS IMPLANT  2006   LEFT EYE  . EP IMPLANTABLE DEVICE N/A 10/18/2015   Procedure: BiV Pacemaker Insertion CRT-P;  Surgeon: [name], MD;  Location: [facility];  Service: Cardiovascular;  Laterality: N/A;  . LAPAROSCOPIC CHOLECYSTECTOMY  10-09-2007  . NESBIT PROCEDURE  01/17/2012   Procedure: NESBIT PROCEDURE;  Surgeon: [name], MD;  Location: [facility];  Service: Urology;  Laterality: N/A;  16 dot plication   . [place], LEFT EYE  1993  . REPAIR LEFT INGUINAL HERNIA  2005    Current Outpatient Prescriptions  Medication Sig Dispense Refill  . carvedilol (COREG) 3.125 MG tablet Take 1 tablet (3.125 mg total) by mouth 2 (two) times daily with a meal. 60 tablet 8   No current facility-administered medications for this visit.     No Known Allergies    Review of Systems negative except from HPI and PMH  Physical Exam BP (!) 142/92   Pulse 62   Ht 5\' 8"  (1.727 m)   Wt 155 lb (70.3 kg)   SpO2 99%   BMI 23.57 kg/m  Well developed and well nourished in no acute distress HENT normal E scleral and icterus clear Neck Supple JVP flat; carotids brisk and full Clear to ausculation Device pocket well healed; without hematoma or erythema.  There is no  tethering there is a lead on the anterior surface of the can Regular rate and rhythm, no murmurs gallops or rub Soft with active bowel sounds No clubbing cyanosis  Edema Alert and oriented, grossly normal motor and sensory functionSkin Warm and Dry  ECG demonstrates sinus rhythm with PVCs synchronous pacing w PVCs   Assessment and  Plan  High grade heart block  PVC  CRT-P  Hypertension  Atrial Tach  AIVR  The pt has a lot of VSR attributable to both PVC and AIVR  Also noted to have atrial tach but not clearly symptomatic  The PVC burden may be sufficient to aggravate cardiomyopathy, we will need to track volume.  For now withl increase carvedilol for both PVCs and BP   Will hve him seen in 4 weeks for further uptitration of carvedilol if BP allows.  Thereafter will use ACE +/- antiarrhythmic   The pt is scheduled for colonscopy next week;  Should be no heart issues for this    Current medicines are reviewed at length with the patient today .  The patient does not  have concerns regarding medicines.

## 2016-02-02 NOTE — Patient Instructions (Addendum)
Medication Instructions: Your physician has recommended you make the following change in your medication:  1) Increase your coreg (carvedilol) to 6.25 mg twice daily - you may take 2 of the 3.125 mg tablets twice daily until you use them up  Labwork: None Ordered  Procedures/Testing: None Ordered  Follow-Up: Your physician wants you to follow-up in: [place] with Marinus Maw, PA/ Chanetta Marshall, NP for Dr. Caryl Comes. You will receive a reminder letter in the mail two months in advance. If you don't receive a letter, please call our office to schedule the follow-up appointment.  Remote monitoring is used to monitor your Pacemaker  from home. You will get a tentative plan for your remote monitoring when you come back in 4 weeks with Renee.     Any Additional Special Instructions Will Be Listed Below (If Applicable).     If you need a refill on your cardiac medications before your next appointment, please call your pharmacy.

## 2016-02-05 ENCOUNTER — Encounter: Payer: Self-pay | Admitting: Internal Medicine

## 2016-02-05 LAB — HM COLONOSCOPY

## 2016-02-05 NOTE — Telephone Encounter (Signed)
Gave patient [employer] tech services number for further information on magnetic drill use with pacemaker.

## 2016-02-15 NOTE — Progress Notes (Signed)
Cardiology Office Note Date:  02/16/2016  Patient ID:  George, [name] 10-May-1950, MRN [id number] PCP:  Mellody Dance, DO  Cardiologist:  Dr. Radford Pax Electrophysiologist: Dr. Caryl Comes    Chief Complaint: planned f/u  History of Present Illness: George [name] is a 65 y.o. male with history of HTN, , 2nd degree AVblock,II, s/p CRT-P comes in to the office today to be seen for Dr. Caryl Comes.  He was last seen by him 02/02/16 with improving exertional capacity c/o palpitations and noting PVC's, coreg was increased and comes in today to evaluate his tolerance and plans to up-titrate.  He if feeling very well, no palpitations, though occasionally when checking his pulse feels a skipped beat.  He has not had any kind of CP or SOB, no dizziness, near syncope or syncope.  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  Device information: MDT CRT-P, implanted 10/18/15, Dr. Caryl Comes   Past Medical History:  Diagnosis Date  . Arthritis THUMBS  . LBBB (left bundle branch block)   . Peyronie's disease     Past Surgical History:  Procedure Laterality Date  . CARDIOVASCULAR STRESS TEST  10-18-2002  DR  CRENSHAW   NORMAL ADENOSINE CARDIOLITE/ EF 60%  . CATARACT EXTRACTION W/ INTRAOCULAR LENS IMPLANT  2006   LEFT EYE  . EP IMPLANTABLE DEVICE N/A 10/18/2015   Procedure: BiV Pacemaker Insertion CRT-P;  Surgeon: [name], MD;  Location: [place] [facility];  Service: Cardiovascular;  Laterality: N/A;  . LAPAROSCOPIC CHOLECYSTECTOMY  10-09-2007  . NESBIT PROCEDURE  01/17/2012   Procedure: NESBIT PROCEDURE;  Surgeon: [name], MD;  Location[facility];  Service: Urology;  Laterality: N/A;  16 dot plication   . [place], LEFT EYE  1993  . REPAIR LEFT INGUINAL HERNIA  2005    Current Outpatient Prescriptions  Medication Sig Dispense Refill  . carvedilol (COREG) 6.25 MG tablet Take 1 tablet (6.25 mg total) by mouth 2  (two) times daily with a meal. 60 tablet 6   No current facility-administered medications for this visit.     Allergies:   Patient has no known allergies.   Social History:  The patient  reports that he has never smoked. He has never used smokeless tobacco. He reports that he does not drink alcohol or use drugs.   Family History:  The patient's family history includes Arrhythmia in his father; Diabetes in his mother; Healthy in his daughter and son; Heart attack in his paternal grandfather; Heart disease in his father and paternal grandfather; Hyperlipidemia in his father and mother; Hypertension in his father and mother.  ROS:  Please see the history of present illness.  All other systems are reviewed and otherwise negative.   PHYSICAL EXAM:  VS:  BP (!) 146/84   Ht 5\' 8"  (1.727 m)   Wt 157 lb (71.2 kg)   BMI 23.87 kg/m  BMI: Body mass index is 23.87 kg/m. Well nourished, well developed, in no acute distress  HEENT: normocephalic, atraumatic  Neck: no JVD, carotid bruits or masses Cardiac:  RRR; no significant murmurs, no rubs, or gallops Lungs:  clear to auscultation bilaterally, no wheezing, rhonchi or rales  Abd: soft, nontender MS: no deformity or atrophy Ext: no edema  Skin: warm and dry, no rash Neuro:  No gross deficits appreciated Psych: euthymic mood, full affect  PPM site is stable, no tethering or discomfort  EKG:  Done 02/02/16 showed SR, one Apced beat,  IVCD ICD interrogation done today and reviewed by myself: stable battery and lead status, 2 NSVT, 94% BiVe paced  10/16/15: TTE Study Conclusions - Left ventricle: The cavity size was normal. Wall thickness was   normal. Systolic function was mildly reduced. The estimated   ejection fraction was in the range of 45% to 50%. Mild diffuse   hypokinesis with no identifiable regional variations. - Ventricular septum: Septal motion showed paradox. - Aortic valve: There was mild regurgitation. - Mitral valve:  Systolic bowing without prolapse. There was mild   regurgitation. - Tricuspid valve: Mild prolapse. - Pulmonary arteries: PA peak pressure: 31 mm Hg (S). Impressions: - Second degree AV block and Incessant PVCs are present throughout   the study and limit evaluation of systolic and diastolic   function.    Recent Labs: 09/29/2015: TSH 1.23 01/01/2016: ALT 15; BUN 11; Creatinine, Ser 1.09; Hemoglobin 14.3; Platelets 175; Potassium 3.5; Sodium 138  09/29/2015: Cholesterol 177; HDL 57; LDL Cholesterol 110; Total CHOL/HDL Ratio 3.1; Triglycerides 52; VLDL 10   CrCl cannot be calculated (Patient's most recent lab result is older than the maximum 21 days allowed.).   Wt Readings from Last 3 Encounters:  02/16/16 157 lb (71.2 kg)  02/02/16 155 lb (70.3 kg)  01/01/16 156 lb 4.8 oz (70.9 kg)     Other studies reviewed: Additional studies/records reviewed today include: summarized above  ASSESSMENT AND PLAN:  1. Heart block s/p PPM     stable device function, no changes made     94% BiVe pacing, 2 NSVT  2. CM     Exam is euvolemic, Optivol looks good     HR histogram looks good  3. PVC's  Will increase his Coreg to 12.5mg  BID in effort to increase BiVe pacing, and given he has some NSVT.  Will see him back in a month to add on ACE, sooner if needed.       Disposition: as above  Current medicines are reviewed at length with the patient today.  The patient did not have any concerns regarding medicines.  Haywood Lasso, PA-C 02/16/2016 9:52 AM     [facility] [address] [place] [address] [phone] (office)  [phone] (fax)

## 2016-02-16 ENCOUNTER — Ambulatory Visit (INDEPENDENT_AMBULATORY_CARE_PROVIDER_SITE_OTHER): Payer: BLUE CROSS/BLUE SHIELD | Admitting: Physician Assistant

## 2016-02-16 VITALS — BP 146/84 | Ht 68.0 in | Wt 157.0 lb

## 2016-02-16 DIAGNOSIS — I441 Atrioventricular block, second degree: Secondary | ICD-10-CM

## 2016-02-16 DIAGNOSIS — I420 Dilated cardiomyopathy: Secondary | ICD-10-CM | POA: Diagnosis not present

## 2016-02-16 DIAGNOSIS — I472 Ventricular tachycardia: Secondary | ICD-10-CM | POA: Diagnosis not present

## 2016-02-16 DIAGNOSIS — I4729 Other ventricular tachycardia: Secondary | ICD-10-CM

## 2016-02-16 MED ORDER — CARVEDILOL 12.5 MG PO TABS: 12.5000 mg | ORAL_TABLET | Freq: Two times a day (BID) | ORAL | 3 refills | Status: DC

## 2016-02-16 NOTE — Addendum Note (Signed)
Addended by: Claude Manges on: 02/16/2016 10:43 AM   Modules accepted: Orders

## 2016-02-16 NOTE — Patient Instructions (Signed)
Medication Instructions:   START TAKING COREG 12.5 MG TWICE A DAY   If you need a refill on your cardiac medications before your next appointment, please call your pharmacy.  Labwork: NONE ORDERED  TODAY'   Testing/Procedures: NONE ORDERED  TODAY    Follow-Up:  WITH RENEE IN ONE MONTH    Any Other Special Instructions Will Be Listed Below (If Applicable).

## 2016-02-23 NOTE — Progress Notes (Signed)
AA

## 2016-03-01 ENCOUNTER — Encounter: Payer: BLUE CROSS/BLUE SHIELD | Admitting: Physician Assistant

## 2016-03-12 ENCOUNTER — Encounter: Payer: Self-pay | Admitting: *Deleted

## 2016-03-27 ENCOUNTER — Encounter: Payer: Self-pay | Admitting: Family Medicine

## 2016-03-27 ENCOUNTER — Ambulatory Visit (INDEPENDENT_AMBULATORY_CARE_PROVIDER_SITE_OTHER): Payer: BLUE CROSS/BLUE SHIELD | Admitting: Family Medicine

## 2016-03-27 VITALS — BP 165/92 | HR 54 | Temp 97.7°F | Ht 68.0 in | Wt 161.8 lb

## 2016-03-27 DIAGNOSIS — J0100 Acute maxillary sinusitis, unspecified: Secondary | ICD-10-CM

## 2016-03-27 DIAGNOSIS — R059 Cough, unspecified: Secondary | ICD-10-CM

## 2016-03-27 DIAGNOSIS — I10 Essential (primary) hypertension: Secondary | ICD-10-CM | POA: Diagnosis not present

## 2016-03-27 DIAGNOSIS — R05 Cough: Secondary | ICD-10-CM | POA: Diagnosis not present

## 2016-03-27 DIAGNOSIS — H6501 Acute serous otitis media, right ear: Secondary | ICD-10-CM

## 2016-03-27 MED ORDER — AMOXICILLIN-POT CLAVULANATE 875-125 MG PO TABS: 1.0000 | ORAL_TABLET | Freq: Two times a day (BID) | ORAL | 0 refills | Status: AC

## 2016-03-27 MED ORDER — HYDROCOD POLST-CPM POLST ER 10-8 MG/5ML PO SUER: 5.0000 mL | Freq: Two times a day (BID) | ORAL | 0 refills | Status: DC | PRN

## 2016-03-27 MED ORDER — PREDNISONE 10 MG (21) PO TBPK: ORAL_TABLET | ORAL | 0 refills | Status: DC

## 2016-03-27 NOTE — Progress Notes (Signed)
Acute Care Office visit  Assessment and plan:  1. Acute maxillary sinusitis, recurrence not specified   2. Essential hypertension   3. Right acute serous otitis media, recurrence not specified   4. Cough    Anticipatory guidance and routine counseling done re: condition, txmnt options and need for follow up. All questions of patient's were answered.  - Viral vs Allergic vs Bacterial causes for pt's symptoms reveiwed.    - Supportive care and various OTC medications discussed in addition to any prescribed. - Call or RTC if new symptoms, or if no improvement or worse over next couple days.     New Prescriptions   AMOXICILLIN-CLAVULANATE (AUGMENTIN) 875-125 MG TABLET    Take 1 tablet by mouth 2 (two) times daily.   CHLORPHENIRAMINE-HYDROCODONE (TUSSIONEX) 10-8 MG/5ML SUER    Take 5 mLs by mouth every 12 (twelve) hours as needed for cough (cough, will cause drowsiness.).   PREDNISONE (STERAPRED UNI-PAK 21 TAB) 10 MG (21) TBPK TABLET    12 day taper pack, use as directed    Modified Medications   No medications on file    Discontinued Medications   No medications on file     Gross side effects, risk and benefits, and alternatives of medications discussed with patient.  Patient is aware that all medications have potential side effects and we are unable to predict every sideeffect or drug-drug interaction that may occur.  Expresses verbal understanding and consents to current therapy plan and treatment regiment.  Return if symptoms worsen or fail to improve.  Please see AVS handed out to patient at the end of our visit for additional patient instructions/ counseling done pertaining to today's office visit.  Note: This document was prepared using Dragon voice recognition software and may include unintentional dictation errors.    Subjective:    Chief Complaint  Patient presents with  . Cough    HPI:    Wife sick with viral bronchitis for sev wks now.   Pt presents  with URI sx for 2-3 days.      C/o rhinorrhea, ST, and cough- dry.   Today starting to feel chest congfested, no ear pain but feels stopped up, fatigued, itchy eyes,   Denies objective F/C, No face pain or ear pain, No N/V/D, No SOB/DIB/ wh, No Rash.      taken anything for sx-->   mucinex- tried yesterday- helped a lot only 4-5 hrs  Overall getting W.   Going to cards tomorrow--> for his pacemaker and adjustment of coreg--> increasing it so they will treat his elevated Bp with that.    Patient Active Problem List   Diagnosis Date Noted  . BBBB (bilateral bundle branch block) 10/18/2015  . Complete heart block ([place]) 10/18/2015  . Vitamin B12 deficiency 10/06/2015  . Vitamin D insufficiency 10/06/2015  . Bradycardia 10/06/2015  . Hereditary hyperbilirubinemia 10/06/2015  . H/O noncompliance with medical treatment, presenting hazards to health 09/19/2015  . Peyronie's disease 09/19/2015  . DOE (dyspnea on exertion) 09/19/2015  . Unexplained weight loss 09/14/2015  . Essential hypertension 09/14/2015  . h/o Non-compliance: Has not seen a doc in over 13 years 09/14/2015  . Fatigue 09/14/2015    Past medical history, Surgical history, Family history reviewed and noted below, Social history, Allergies, and Medications have been entered into the medical record, reviewed and changed as needed.   No Known Allergies  Review of Systems  Constitutional: Negative for chills, fever and malaise/fatigue.  HENT: Positive for  congestion, sinus pain and sore throat. Negative for ear discharge and ear pain.   Eyes: Negative for pain and discharge.  Respiratory: Positive for cough and sputum production. Negative for shortness of breath, wheezing and stridor.   Cardiovascular: Positive for chest pain.  Gastrointestinal: Negative for diarrhea, nausea and vomiting.  Genitourinary: Negative for dysuria.  Musculoskeletal: Negative for neck pain.  Skin: Negative for rash.  Neurological: Positive for  headaches. Negative for dizziness.  Endo/Heme/Allergies: Negative for environmental allergies.  Psychiatric/Behavioral: The patient does not have insomnia.     Objective:   Blood pressure (!) 165/92, pulse (!) 54, temperature 97.7 F (36.5 C), height 5\' 8"  (1.727 m), weight 161 lb 12.8 oz (73.4 kg), SpO2 100 %. Body mass index is 24.6 kg/m. General: Well Developed, well nourished, appropriate for stated age.  Neuro: Alert and oriented x3, extra-ocular muscles intact, sensation grossly intact.  HEENT: Normocephalic, atraumatic, pupils equal round reactive to light, neck supple, no masses, no painful lymphadenopathy, TM's intact B/L- R is bulging and effusion present, cerumen impaction removed by me as well;  L- Tm WNls. Nares- patent, clear d/c, OP- clear, mild erythema, No TTP sinuses Skin: Warm and dry, no gross rash. Cardiac: RRR, S1 S2,  no murmurs rubs or gallops.  Respiratory: ECTA B/L and A/P- No wheeze or rhonchi, Not using accessory muscles, speaking in full sentences- unlabored. Vascular:  No gross lower ext edema, cap RF less 2 sec. Psych: No HI/SI, judgement and insight good, Euthymic mood. Full Affect.   Patient Care Team    Relationship Specialty Notifications Start End  Mellody Dance, DO PCP - General Family Medicine  09/14/15

## 2016-03-27 NOTE — Assessment & Plan Note (Signed)
Going to cards tomorrow--> for his pacemaker and adjustment of coreg--> increasing it so they will treat his elevated Bp with that.

## 2016-03-27 NOTE — Patient Instructions (Signed)
Upper Respiratory Infection, Adult Most upper respiratory infections (URIs) are a viral infection of the air passages leading to the lungs. A URI affects the nose, throat, and upper air passages. The most common type of URI is nasopharyngitis and is typically referred to as "the common cold." URIs run their course and usually go away on their own. Most of the time, a URI does not require medical attention, but sometimes a bacterial infection in the upper airways can follow a viral infection. This is called a secondary infection. Sinus and middle ear infections are common types of secondary upper respiratory infections. Bacterial pneumonia can also complicate a URI. A URI can worsen asthma and chronic obstructive pulmonary disease (COPD). Sometimes, these complications can require emergency medical care and may be life threatening. What are the causes? Almost all URIs are caused by viruses. A virus is a type of germ and can spread from one person to another. What increases the risk? You may be at risk for a URI if:  You smoke.  You have chronic heart or lung disease.  You have a weakened defense (immune) system.  You are very young or very old.  You have nasal allergies or asthma.  You work in crowded or poorly ventilated areas.  You work in health care facilities or schools.  What are the signs or symptoms? Symptoms typically develop 2-3 days after you come in contact with a cold virus. Most viral URIs last 7-10 days. However, viral URIs from the influenza virus (flu virus) can last 14-18 days and are typically more severe. Symptoms may include:  Runny or stuffy (congested) nose.  Sneezing.  Cough.  Sore throat.  Headache.  Fatigue.  Fever.  Loss of appetite.  Pain in your forehead, behind your eyes, and over your cheekbones (sinus pain).  Muscle aches.  How is this diagnosed? Your health care provider may diagnose a URI by:  Physical exam.  Tests to check that your  symptoms are not due to another condition such as: ? Strep throat. ? Sinusitis. ? Pneumonia. ? Asthma.  How is this treated? A URI goes away on its own with time. It cannot be cured with medicines, but medicines may be prescribed or recommended to relieve symptoms. Medicines may help:  Reduce your fever.  Reduce your cough.  Relieve nasal congestion.  Follow these instructions at home:  Take medicines only as directed by your health care provider.  Gargle warm saltwater or take cough drops to comfort your throat as directed by your health care provider.  Use a warm mist humidifier or inhale steam from a shower to increase air moisture. This may make it easier to breathe.  Drink enough fluid to keep your urine clear or pale yellow.  Eat soups and other clear broths and maintain good nutrition.  Rest as needed.  Return to work when your temperature has returned to normal or as your health care provider advises. You may need to stay home longer to avoid infecting others. You can also use a face mask and careful hand washing to prevent spread of the virus.  Increase the usage of your inhaler if you have asthma.  Do not use any tobacco products, including cigarettes, chewing tobacco, or electronic cigarettes. If you need help quitting, ask your health care provider. How is this prevented? The best way to protect yourself from getting a cold is to practice good hygiene.  Avoid oral or hand contact with people with cold symptoms.  Wash your   hands often if contact occurs.  There is no clear evidence that vitamin C, vitamin E, echinacea, or exercise reduces the chance of developing a cold. However, it is always recommended to get plenty of rest, exercise, and practice good nutrition. Contact a health care provider if:  You are getting worse rather than better.  Your symptoms are not controlled by medicine.  You have chills.  You have worsening shortness of breath.  You have  brown or red mucus.  You have yellow or brown nasal discharge.  You have pain in your face, especially when you bend forward.  You have a fever.  You have swollen neck glands.  You have pain while swallowing.  You have white areas in the back of your throat. Get help right away if:  You have severe or persistent: ? Headache. ? Ear pain. ? Sinus pain. ? Chest pain.  You have chronic lung disease and any of the following: ? Wheezing. ? Prolonged cough. ? Coughing up blood. ? A change in your usual mucus.  You have a stiff neck.  You have changes in your: ? Vision. ? Hearing. ? Thinking. ? Mood. This information is not intended to replace advice given to you by your health care provider. Make sure you discuss any questions you have with your health care provider. Document Released: 08/28/2000 Document Revised: 11/05/2015 Document Reviewed: 06/09/2013 Elsevier Interactive Patient Education  2017 Elsevier Inc.  

## 2016-03-29 ENCOUNTER — Encounter: Payer: BLUE CROSS/BLUE SHIELD | Admitting: Physician Assistant

## 2016-04-04 ENCOUNTER — Encounter: Payer: BLUE CROSS/BLUE SHIELD | Admitting: Internal Medicine

## 2016-04-19 ENCOUNTER — Ambulatory Visit (INDEPENDENT_AMBULATORY_CARE_PROVIDER_SITE_OTHER): Payer: BLUE CROSS/BLUE SHIELD | Admitting: Physician Assistant

## 2016-04-19 ENCOUNTER — Encounter: Payer: Self-pay | Admitting: Physician Assistant

## 2016-04-19 VITALS — BP 134/82 | HR 57 | Ht 68.0 in | Wt 160.0 lb

## 2016-04-19 DIAGNOSIS — R001 Bradycardia, unspecified: Secondary | ICD-10-CM | POA: Diagnosis not present

## 2016-04-19 DIAGNOSIS — I420 Dilated cardiomyopathy: Secondary | ICD-10-CM

## 2016-04-19 DIAGNOSIS — I493 Ventricular premature depolarization: Secondary | ICD-10-CM | POA: Diagnosis not present

## 2016-04-19 DIAGNOSIS — Z950 Presence of cardiac pacemaker: Secondary | ICD-10-CM

## 2016-04-19 DIAGNOSIS — I442 Atrioventricular block, complete: Secondary | ICD-10-CM

## 2016-04-19 MED ORDER — CARVEDILOL 25 MG PO TABS: 25.0000 mg | ORAL_TABLET | Freq: Two times a day (BID) | ORAL | 6 refills | Status: DC

## 2016-04-19 NOTE — Patient Instructions (Addendum)
Medication Instructions:   START TAKING  COREG (CARVEDILOL )  25 MG TWICE A DAY   If you need a refill on your cardiac medications before your next appointment, please call your pharmacy.  Labwork: BMET AND MAG TODAY    Testing/Procedures: NONE ORDERED  TODAY    Follow-Up:  Your physician wants you to follow-up in:  [place] will receive a reminder letter in the mail two months in advance. If you don't receive a letter, please call our office to schedule the follow-up appointment.   Remote monitoring is used to monitor your Pacemaker of ICD from home. This monitoring reduces the number of office visits required to check your device to one time per year. It allows [place] to keep an eye on the functioning of your device to ensure it is working properly. You are scheduled for a device check from home on . 07/18/2016..You may send your transmission at any time that day. If you have a wireless device, the transmission will be sent automatically. After your physician reviews your transmission, you will receive a postcard with your next transmission date.     Any Other Special Instructions Will Be Listed Below (If Applicable).

## 2016-04-19 NOTE — Progress Notes (Signed)
Cardiology Office Note Date:  04/19/2016  Patient ID:  George [name], George [name] [birth year], MRN [id number] PCP:  Mellody Dance, DO  Cardiologist:  Dr. Radford Pax Electrophysiologist: Dr. Caryl Comes    Chief Complaint: planned f/u  History of Present Illness: George [name] is a 66 y.o. male with history of HTN, , 2nd degree AVblock,II, s/p CRT-P comes in to the office today to be seen for Dr. Caryl Comes.  He was last seen by him 02/02/16 with improving exertional capacity c/o palpitations and noting PVC's, subsequently seen by myself 21/1/17 noting only 94% BiVe pacing, NSVt and his coreg up-titrated further.  He is accompanied by his wife, and again reports feeling very well, no palpitations, denies any kind of CP or SOB, no dizziness, near syncope or syncope.  He has not been shocked by his device.   Device information: MDT CRT-P, implanted 10/18/15, Dr. Caryl Comes   Past Medical History:  Diagnosis Date  . Arthritis THUMBS  . LBBB (left bundle branch block)   . Peyronie's disease     Past Surgical History:  Procedure Laterality Date  . CARDIOVASCULAR STRESS TEST  10-18-2002  DR  CRENSHAW   NORMAL ADENOSINE CARDIOLITE/ EF 60%  . CATARACT EXTRACTION W/ INTRAOCULAR LENS IMPLANT  2006   LEFT EYE  . EP IMPLANTABLE DEVICE N/A 10/18/2015   Procedure: BiV Pacemaker Insertion CRT-P;  Surgeon: [name], MD;  Location: [facility];  Service: Cardiovascular;  Laterality: N/A;  . LAPAROSCOPIC CHOLECYSTECTOMY  10-09-2007  . NESBIT PROCEDURE  01/17/2012   Procedure: NESBIT PROCEDURE;  Surgeon: [name], MD;  Location: [facility];  Service: Urology;  Laterality: N/A;  16 dot plication   . [place], LEFT EYE  1993  . REPAIR LEFT INGUINAL HERNIA  2005    Current Outpatient Prescriptions  Medication Sig Dispense Refill  . carvedilol (COREG) 12.5 MG tablet Take 1 tablet (12.5 mg total) by mouth 2 (two) times daily with a meal. 180 tablet 3  .  chlorpheniramine-HYDROcodone (TUSSIONEX) 10-8 MG/5ML SUER Take 5 mLs by mouth every 12 (twelve) hours as needed for cough (cough, will cause drowsiness.). 200 mL 0  . predniSONE (STERAPRED UNI-PAK 21 TAB) 10 MG (21) TBPK tablet 12 day taper pack, use as directed 48 tablet 0   No current facility-administered medications for this visit.     Allergies:   Patient has no known allergies.   Social History:  The patient  reports that he has never smoked. He has never used smokeless tobacco. He reports that he does not drink alcohol or use drugs.   Family History:  The patient's family history includes Arrhythmia in his father; Diabetes in his mother; Healthy in his daughter and son; Heart attack in his paternal grandfather; Heart disease in his father and paternal grandfather; Hyperlipidemia in his father and mother; Hypertension in his father and mother.  ROS:  Please see the history of present illness.  All other systems are reviewed and otherwise negative.   PHYSICAL EXAM:  VS:  There were no vitals taken for this visit. BMI: There is no height or weight on file to calculate BMI. Well nourished, well developed, in no acute distress  HEENT: normocephalic, atraumatic  Neck: no JVD, carotid bruits or masses Cardiac:  RRR; no significant murmurs, no rubs, or gallops Lungs:  CTA b/l, no wheezing, rhonchi or rales  Abd: soft, nontender MS: no deformity or atrophy Ext: no edema  Skin: warm and dry, no  rash Neuro:  No gross deficits appreciated Psych: euthymic mood, full affect  PPM site is stable, no tethering or discomfort   EKG:  Done 02/02/16 showed SR, one Apced beat,  IVCD ICD interrogation done today and reviewed by myself:  stable battery and lead status, 12 NSVT episodes are logged, one appears to be NSVT of one second duraton, a second is less clear of 7 seconds, all the others are ATach , 95.3% BiVe paced, no therapies.  10/16/15: TTE Study Conclusions - Left ventricle: The cavity  size was normal. Wall thickness was   normal. Systolic function was mildly reduced. The estimated   ejection fraction was in the range of 45% to 50%. Mild diffuse   hypokinesis with no identifiable regional variations. - Ventricular septum: Septal motion showed paradox. - Aortic valve: There was mild regurgitation. - Mitral valve: Systolic bowing without prolapse. There was mild   regurgitation. - Tricuspid valve: Mild prolapse. - Pulmonary arteries: PA peak pressure: 31 mm Hg (S). Impressions: - Second degree AV block and Incessant PVCs are present throughout   the study and limit evaluation of systolic and diastolic   function.    Recent Labs: 09/29/2015: TSH 1.23 01/01/2016: ALT 15; BUN 11; Creatinine, Ser 1.09; Hemoglobin 14.3; Platelets 175; Potassium 3.5; Sodium 138  09/29/2015: Cholesterol 177; HDL 57; LDL Cholesterol 110; Total CHOL/HDL Ratio 3.1; Triglycerides 52; VLDL 10   CrCl cannot be calculated (Patient's most recent lab result is older than the maximum 21 days allowed.).   Wt Readings from Last 3 Encounters:  03/27/16 161 lb 12.8 oz (73.4 kg)  02/16/16 157 lb (71.2 kg)  02/02/16 155 lb (70.3 kg)     Other studies reviewed: Additional studies/records reviewed today include: summarized above  ASSESSMENT AND PLAN:  1. Heart block s/p PPM     stable device function, no changes made     Minimal improvement to 95.3 % BiVe pacing  2. Mild CM     Exam is euvolemic, Optivol looks good     No symptoms     Echo done noting incessant PVC's  3. PVC's, down some 96.5/hour     NSVT  Increase his coreg to 25mg  BID, will get BMET and mag level given NSVT, will plan to add ACE pending his labs      Disposition: 3 month remote check, 6 months back in-clinic sooner if needed.  Current medicines are reviewed at length with the patient today.  The patient did not have any concerns regarding medicines.  Haywood Lasso, PA-C 04/19/2016 7:55 AM     [facility] [address] [place] [address] [phone] (office)  [phone] (fax)

## 2016-04-20 LAB — BASIC METABOLIC PANEL
BUN / CREAT RATIO: 11 (ref 10–24)
BUN: 9 mg/dL (ref 8–27)
CO2: 26 mmol/L (ref 18–29)
CREATININE: 0.82 mg/dL (ref 0.76–1.27)
Calcium: 9.3 mg/dL (ref 8.6–10.2)
Chloride: 97 mmol/L (ref 96–106)
GFR calc Af Amer: 109 mL/min/{1.73_m2} (ref >59)
GFR calc non Af Amer: 94 mL/min/{1.73_m2} (ref >59)
GLUCOSE: 82 mg/dL (ref 65–99)
Potassium: 4 mmol/L (ref 3.5–5.2)
Sodium: 136 mmol/L (ref 134–144)

## 2016-04-20 LAB — MAGNESIUM: Magnesium: 2.1 mg/dL (ref 1.6–2.3)

## 2016-04-23 ENCOUNTER — Telehealth: Payer: Self-pay | Admitting: *Deleted

## 2016-04-23 NOTE — Telephone Encounter (Signed)
LMOVM TO CALL BACK FOR RESULTS 

## 2016-04-23 NOTE — Telephone Encounter (Signed)
-----   Message from [facility], [place] sent at 04/22/2016  2:43 PM EST ----- Please let the patient know his labs look ok, continue medicines as discussed.  Thanks [employer]

## 2016-04-25 ENCOUNTER — Telehealth: Payer: Self-pay | Admitting: *Deleted

## 2016-04-25 NOTE — Telephone Encounter (Signed)
-----   Message from [facility], [place] sent at 04/22/2016  2:43 PM EST ----- Please let the patient know his labs look ok, continue medicines as discussed.  Thanks [employer]

## 2016-05-10 ENCOUNTER — Encounter: Payer: Self-pay | Admitting: *Deleted

## 2016-08-20 ENCOUNTER — Encounter: Payer: BLUE CROSS/BLUE SHIELD | Admitting: *Deleted

## 2016-08-20 ENCOUNTER — Telehealth: Payer: Self-pay | Admitting: Cardiology

## 2016-08-20 NOTE — Telephone Encounter (Signed)
LMOVM reminding pt to send remote transmission.   

## 2016-08-21 ENCOUNTER — Ambulatory Visit (INDEPENDENT_AMBULATORY_CARE_PROVIDER_SITE_OTHER): Payer: BLUE CROSS/BLUE SHIELD | Admitting: Adult Health

## 2016-08-21 ENCOUNTER — Encounter: Payer: Self-pay | Admitting: Adult Health

## 2016-08-21 DIAGNOSIS — R42 Dizziness and giddiness: Secondary | ICD-10-CM | POA: Insufficient documentation

## 2016-08-21 DIAGNOSIS — R001 Bradycardia, unspecified: Secondary | ICD-10-CM | POA: Diagnosis not present

## 2016-08-21 NOTE — Patient Instructions (Signed)
Dizziness Dizziness is a common problem. It is a feeling of unsteadiness or light-headedness. You may feel like you are about to faint. Dizziness can lead to injury if you stumble or fall. Anyone can become dizzy, but dizziness is more common in older adults. This condition can be caused by a number of things, including medicines, dehydration, or illness. Follow these instructions at home: Taking these steps may help with your condition: Eating and drinking  Drink enough fluid to keep your urine clear or pale yellow. This helps to keep you from becoming dehydrated. Try to drink more clear fluids, such as water.  Do not drink alcohol.  Limit your caffeine intake if directed by your health care provider.  Limit your salt intake if directed by your health care provider. Activity  Avoid making quick movements. ? Rise slowly from chairs and steady yourself until you feel okay. ? In the morning, first sit up on the side of the bed. When you feel okay, stand slowly while you hold onto something until you know that your balance is fine.  Move your legs often if you need to stand in one place for a long time. Tighten and relax your muscles in your legs while you are standing.  Do not drive or operate heavy machinery if you feel dizzy.  Avoid bending down if you feel dizzy. Place items in your home so that they are easy for you to reach without leaning over. Lifestyle  Do not use any tobacco products, including cigarettes, chewing tobacco, or electronic cigarettes. If you need help quitting, ask your health care provider.  Try to reduce your stress level, such as with yoga or meditation. Talk with your health care provider if you need help. General instructions  Watch your dizziness for any changes.  Take medicines only as directed by your health care provider. Talk with your health care provider if you think that your dizziness is caused by a medicine that you are taking.  Tell a friend or  a family member that you are feeling dizzy. If he or she notices any changes in your behavior, have this person call your health care provider.  Keep all follow-up visits as directed by your health care provider. This is important. Contact a health care provider if:  Your dizziness does not go away.  Your dizziness or light-headedness gets worse.  You feel nauseous.  You have reduced hearing.  You have new symptoms.  You are unsteady on your feet or you feel like the room is spinning. Get help right away if:  You vomit or have diarrhea and are unable to eat or drink anything.  You have problems talking, walking, swallowing, or using your arms, hands, or legs.  You feel generally weak.  You are not thinking clearly or you have trouble forming sentences. It may take a friend or family member to notice this.  You have chest pain, abdominal pain, shortness of breath, or sweating.  Your vision changes.  You notice any bleeding.  You have a headache.  You have neck pain or a stiff neck.  You have a fever. This information is not intended to replace advice given to you by your health care provider. Make sure you discuss any questions you have with your health care provider. Document Released: 08/28/2000 Document Revised: 08/10/2015 Document Reviewed: 02/28/2014 Elsevier Interactive Patient Education  2017 [employer].  Please start OTC Antihistamine and use daily for 10-14 days. Increase water intake to at least 80  ounces daily. If symptoms persist after 2 weeks, then please call clinic.

## 2016-08-21 NOTE — Assessment & Plan Note (Addendum)
Orthostatic BP checks negative. Negative [place] Plan. Negative Romberg. Neurological exam normal. Please start OTC Antihistamine and use daily for 10-14 days. Increase water intake to at least 80 ounces daily. If symptoms persist after 2 weeks, then please call clinic.

## 2016-08-21 NOTE — Progress Notes (Signed)
Subjective:    Patient ID: George [name], male    DOB: May 22, 1950, 66 y.o.   MRN: [id number]  HPI:  George [name] is here with complaint of dizziness that he feels began after pacemaker implanted October 18, 2015 (PPM placed for symptomatic bradycardia).  He reports that the dizziness occurs most days of the week and only last for <30 seconds.  Often occurs when "turns around too quickly".  He denies needing to use walls/furniture to steady himself.  He denies falls or syncopal episodes.  He denies personal or family hx of vertigo.  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  He has hx of seasonal allergies with current flare up (nasal congestion, ear fullness, facial pressure).  He denies CP/palpitations/dyspnea.  He is followed by cards every 6 months.  Patient Care Team    Relationship Specialty Notifications Start End  Mellody Dance, DO PCP - General Family Medicine  09/14/15     Patient Active Problem List   Diagnosis Date Noted  . Dizziness 08/21/2016  . BBBB (bilateral bundle branch block) 10/18/2015  . Complete heart block ([place]) 10/18/2015  . Vitamin B12 deficiency 10/06/2015  . Vitamin D insufficiency 10/06/2015  . Bradycardia 10/06/2015  . Hereditary hyperbilirubinemia 10/06/2015  . H/O noncompliance with medical treatment, presenting hazards to health 09/19/2015  . Peyronie's disease 09/19/2015  . DOE (dyspnea on exertion) 09/19/2015  . Unexplained weight loss 09/14/2015  . Essential hypertension 09/14/2015  . h/o Non-compliance: Has not seen a doc in over 13 years 09/14/2015  . Fatigue 09/14/2015     Past Medical History:  Diagnosis Date  . Arthritis THUMBS  . LBBB (left bundle branch block)   . Peyronie's disease      Past Surgical History:  Procedure Laterality Date  . CARDIOVASCULAR STRESS TEST  10-18-2002  DR  CRENSHAW   NORMAL ADENOSINE CARDIOLITE/ EF 60%  . CATARACT EXTRACTION W/ INTRAOCULAR LENS IMPLANT  2006   LEFT  EYE  . EP IMPLANTABLE DEVICE N/A 10/18/2015   Procedure: BiV Pacemaker Insertion CRT-P;  Surgeon: [name], MD;  Location: [facility];  Service: Cardiovascular;  Laterality: N/A;  . LAPAROSCOPIC CHOLECYSTECTOMY  10-09-2007  . NESBIT PROCEDURE  01/17/2012   Procedure: NESBIT PROCEDURE;  Surgeon: [name], MD;  Location: [facility];  Service: Urology;  Laterality: N/A;  16 dot plication   . [place], LEFT EYE  1993  . REPAIR LEFT INGUINAL HERNIA  2005     Family History  Problem Relation Age of Onset  . Diabetes Mother   . Hypertension Mother   . Hyperlipidemia Mother   . Heart disease Father   . Hyperlipidemia Father   . Hypertension Father   . Arrhythmia Father        paf  . Healthy Daughter   . Healthy Son   . Heart disease Paternal Grandfather   . Heart attack Paternal Grandfather      History  Drug Use No     History  Alcohol Use No     History  Smoking Status  . Never Smoker  Smokeless Tobacco  . Never Used     Outpatient Encounter Prescriptions as of 08/21/2016  Medication Sig  . carvedilol (COREG) 25 MG tablet Take 1 tablet (25 mg total) by mouth 2 (two) times daily with a meal.   No facility-administered encounter medications on file as of 08/21/2016.     Allergies: Patient  has no known allergies.  Body mass index is 24.48 kg/m.  Blood pressure (!) 152/89, pulse (!) 52, temperature 97.5 F (36.4 C), height 5\' 8"  (1.727 m), weight 161 lb (73 kg).     Review of Systems  Constitutional: Positive for fatigue. Negative for activity change, appetite change, chills, diaphoresis, fever and unexpected weight change.  HENT: Positive for congestion, rhinorrhea and sinus pressure.   Eyes: Negative for visual disturbance.  Respiratory: Negative for cough, chest tightness, shortness of breath, wheezing and stridor.   Cardiovascular: Negative for chest pain, palpitations and leg swelling.  Endocrine: Negative  for cold intolerance, heat intolerance, polydipsia, polyphagia and polyuria.  Skin: Negative for color change, pallor, rash and wound.  Allergic/Immunologic: Negative for immunocompromised state.  Neurological: Positive for dizziness. Negative for tremors, seizures, syncope, weakness, light-headedness, numbness and headaches.  Hematological: Does not bruise/bleed easily.       Objective:   Physical Exam  Constitutional: He is oriented to person, place, and time. He appears well-developed and well-nourished. No distress.  HENT:  Head: Normocephalic and atraumatic.  Right Ear: Hearing, external ear and ear canal normal. Tympanic membrane is bulging. Tympanic membrane is not erythematous. No decreased hearing is noted.  Left Ear: Hearing, external ear and ear canal normal. Tympanic membrane is bulging. Tympanic membrane is not erythematous. No decreased hearing is noted.  Nose: Nose normal. Right sinus exhibits no maxillary sinus tenderness and no frontal sinus tenderness. Left sinus exhibits no maxillary sinus tenderness and no frontal sinus tenderness.  Mouth/Throat: Uvula is midline.  Eyes: Conjunctivae are normal. Pupils are equal, round, and reactive to light.  Neck: Normal range of motion. Neck supple.  Cardiovascular: Normal rate, regular rhythm, normal heart sounds and intact distal pulses.   No murmur heard. Pulmonary/Chest: Effort normal and breath sounds normal. No respiratory distress. He has no wheezes. He has no rales. He exhibits no tenderness.  Lymphadenopathy:    He has no cervical adenopathy.  Neurological: He is alert and oriented to person, place, and time. He has normal strength. No cranial nerve deficit or sensory deficit. He displays a negative Romberg sign. Coordination and gait normal.  Negative Dill Hallpike maneuver.  Skin: Skin is warm and dry. No rash noted. He is not diaphoretic. No erythema. No pallor.  Psychiatric: He has a normal mood and affect. His behavior  is normal. Judgment and thought content normal.  Nursing note and vitals reviewed.         Assessment & Plan:   1. Dizziness   2. Bradycardia     Dizziness Orthostatic BP checks negative. Negative Kingsley Plan. Negative Romberg. Neurological exam normal. Please start OTC Antihistamine and use daily for 10-14 days. Increase water intake to at least 80 ounces daily. If symptoms persist after 2 weeks, then please call clinic.   Bradycardia PPM placed August 2017 HR today 68 Continue carvedilol 25mg  BID    FOLLOW-UP:  Return if symptoms worsen or fail to improve.

## 2016-08-21 NOTE — Assessment & Plan Note (Addendum)
PPM placed August 2017 HR today 68 Continue carvedilol 25mg  BID

## 2016-08-22 ENCOUNTER — Telehealth: Payer: Self-pay | Admitting: Physician Assistant

## 2016-08-22 NOTE — Telephone Encounter (Signed)
Opened in error

## 2016-08-23 ENCOUNTER — Encounter: Payer: Self-pay | Admitting: Cardiology

## 2016-09-04 ENCOUNTER — Ambulatory Visit (INDEPENDENT_AMBULATORY_CARE_PROVIDER_SITE_OTHER): Payer: BLUE CROSS/BLUE SHIELD | Admitting: *Deleted

## 2016-09-04 ENCOUNTER — Telehealth: Payer: Self-pay | Admitting: Internal Medicine

## 2016-09-04 DIAGNOSIS — I442 Atrioventricular block, complete: Secondary | ICD-10-CM | POA: Diagnosis not present

## 2016-09-04 NOTE — Telephone Encounter (Signed)
Informed wife that transmission was received. Wife verbalized understanding.

## 2016-09-04 NOTE — Telephone Encounter (Signed)
New message ° ° ° °1. Has your device fired? no ° °2. Is you device beeping?no ° °3. Are you experiencing draining or swelling at device site? no ° °4. Are you calling to see if we received your device transmission? YES ° °5. Have you passed out? no °

## 2016-09-06 ENCOUNTER — Encounter: Payer: Self-pay | Admitting: Cardiology

## 2016-09-06 NOTE — Progress Notes (Signed)
Remote pacemaker transmission.   

## 2016-09-11 LAB — CUP PACEART REMOTE DEVICE CHECK
Brady Statistic AP VS Percent: 0.37 %
Brady Statistic AS VP Percent: 80.86 %
Brady Statistic RA Percent Paced: 17.1 %
Brady Statistic RV Percent Paced: 6.03 %
Implantable Lead Implant Date: 20170802
Implantable Lead Implant Date: 20170802
Implantable Lead Location: 753859
Implantable Lead Model: 5076
Implantable Pulse Generator Implant Date: 20170802
Lead Channel Impedance Value: 1159 Ohm
Lead Channel Impedance Value: 1159 Ohm
Lead Channel Impedance Value: 1235 Ohm
Lead Channel Impedance Value: 342 Ohm
Lead Channel Impedance Value: 380 Ohm
Lead Channel Impedance Value: 513 Ohm
Lead Channel Impedance Value: 912 Ohm
Lead Channel Impedance Value: 950 Ohm
Lead Channel Pacing Threshold Amplitude: 0.625 V
Lead Channel Pacing Threshold Amplitude: 1.125 V
Lead Channel Pacing Threshold Pulse Width: 0.4 ms
Lead Channel Sensing Intrinsic Amplitude: 17 mV
Lead Channel Sensing Intrinsic Amplitude: 17 mV
Lead Channel Setting Pacing Amplitude: 1.5 V
Lead Channel Setting Pacing Amplitude: 2.25 V
Lead Channel Setting Pacing Pulse Width: 0.8 ms
MDC IDC LEAD IMPLANT DT: 20170802
MDC IDC LEAD LOCATION: 753858
MDC IDC LEAD LOCATION: 753860
MDC IDC MSMT BATTERY REMAINING LONGEVITY: 126 mo
MDC IDC MSMT BATTERY VOLTAGE: 3.05 V
MDC IDC MSMT LEADCHNL LV IMPEDANCE VALUE: 456 Ohm
MDC IDC MSMT LEADCHNL LV IMPEDANCE VALUE: 475 Ohm
MDC IDC MSMT LEADCHNL LV IMPEDANCE VALUE: 589 Ohm
MDC IDC MSMT LEADCHNL LV IMPEDANCE VALUE: 798 Ohm
MDC IDC MSMT LEADCHNL LV IMPEDANCE VALUE: 817 Ohm
MDC IDC MSMT LEADCHNL LV PACING THRESHOLD AMPLITUDE: 1.5 V
MDC IDC MSMT LEADCHNL LV PACING THRESHOLD PULSEWIDTH: 0.8 ms
MDC IDC MSMT LEADCHNL RA PACING THRESHOLD PULSEWIDTH: 0.4 ms
MDC IDC MSMT LEADCHNL RA SENSING INTR AMPL: 2.375 mV
MDC IDC MSMT LEADCHNL RA SENSING INTR AMPL: 2.375 mV
MDC IDC MSMT LEADCHNL RV IMPEDANCE VALUE: 475 Ohm
MDC IDC SESS DTM: 20180620132353
MDC IDC SET LEADCHNL RV PACING AMPLITUDE: 2.25 V
MDC IDC SET LEADCHNL RV PACING PULSEWIDTH: 0.4 ms
MDC IDC SET LEADCHNL RV SENSING SENSITIVITY: 2.8 mV
MDC IDC STAT BRADY AP VP PERCENT: 16.16 %
MDC IDC STAT BRADY AS VS PERCENT: 2.61 %

## 2016-09-27 ENCOUNTER — Other Ambulatory Visit: Payer: Self-pay | Admitting: Physician Assistant

## 2016-10-01 ENCOUNTER — Telehealth: Payer: Self-pay | Admitting: Physician Assistant

## 2016-10-03 NOTE — Telephone Encounter (Signed)
Spoke with the patient's wife (DPR). She reports the patient has been having issues with dizziness and some staggering while walking for the past 2 weeks. She states symptoms are unrelated to positional changes. The patient has been out in the heat some, but symptoms are also unrelated to heat exposure vs none. He is staying hydrated per Mrs. Balcom report.  He is currently only taking coreg 25 mg BID and magnesium once daily. They have not checked his BP at home, but do have a way to do this.  I have asked that she check his BP this evening and again in the morning. I have also asked that they send a transmission to verify no rhythm issues- last transmission was about 2 weeks ago per Mrs. Wonder report.  It will be later this evening before they can transmit.  I advised will forward this to the [facility] nurse and triage to follow up with her tomorrow regarding transmission data and what his blood pressures are running. She voices understanding and is agreeable.

## 2016-10-03 NOTE — Telephone Encounter (Signed)
[name] is calling because , George [name] is having some dizziness and staggering while walking . Please call

## 2016-10-04 NOTE — Telephone Encounter (Signed)
Manual transmission received on 10/03/16 at 2128.  Presenting rhythm is As/Vp.  1 "VT-NS" episode, EGM appears 1:1 SVT, duration 16bts, episode occurred on 09/14/16.  Thoracic impedance stable.  BiV pacing 96.7%.  No alerts or other abnormalities noted.  Device function stable compared with previous transmissions.

## 2016-10-04 NOTE — Telephone Encounter (Signed)
I spoke with pt's wife and told her device transmission was stable.  She reports pt's BP was 148/88 and heart rate 57 this morning.  No other readings available.  I asked her to continue to monitor BP and call us next week with readings.

## 2016-11-08 ENCOUNTER — Ambulatory Visit: Payer: BLUE CROSS/BLUE SHIELD | Admitting: Family Medicine

## 2016-11-11 ENCOUNTER — Encounter: Payer: Self-pay | Admitting: Family Medicine

## 2016-11-11 ENCOUNTER — Ambulatory Visit (INDEPENDENT_AMBULATORY_CARE_PROVIDER_SITE_OTHER): Payer: BLUE CROSS/BLUE SHIELD | Admitting: Family Medicine

## 2016-11-11 VITALS — BP 137/83 | HR 71 | Wt 167.0 lb

## 2016-11-11 DIAGNOSIS — Z9119 Patient's noncompliance with other medical treatment and regimen: Secondary | ICD-10-CM

## 2016-11-11 DIAGNOSIS — E559 Vitamin D deficiency, unspecified: Secondary | ICD-10-CM

## 2016-11-11 DIAGNOSIS — Z91199 Patient's noncompliance with other medical treatment and regimen due to unspecified reason: Secondary | ICD-10-CM

## 2016-11-11 DIAGNOSIS — E538 Deficiency of other specified B group vitamins: Secondary | ICD-10-CM

## 2016-11-11 DIAGNOSIS — T733XXD Exhaustion due to excessive exertion, subsequent encounter: Secondary | ICD-10-CM

## 2016-11-11 DIAGNOSIS — I10 Essential (primary) hypertension: Secondary | ICD-10-CM

## 2016-11-11 DIAGNOSIS — I442 Atrioventricular block, complete: Secondary | ICD-10-CM

## 2016-11-11 NOTE — Patient Instructions (Addendum)
- We will try to get your colonoscopy path results from 2017.  - Also for your sinus congestion, please do the Nettie pot twice daily.  Afterward please spray 1 spray of Flonase in each nostril twice daily following the Nettie pot cleanse.  Also may add Allegra or Zyrtec daily as a preventative.  But you need to take this along with the Flonase every day.  I would continue until the allergy season is done around end of November.     - Please realize, EXERCISE IS MEDICINE!  -  American Heart Association [facility]) guidelines for exercise : If you are in good health, without any medical conditions, you should engage in 150 minutes of moderate intensity aerobic activity per week.  This means you should be huffing and puffing throughout your workout.   Engaging in regular exercise will improve brain function and memory, as well as improve mood, boost immune system and help with weight management.  As well as the other, more well-known effects of exercise such as decreasing blood sugar levels, decreasing blood pressure,  and decreasing bad cholesterol levels/ increasing good cholesterol levels.     -  The AHA strongly endorses consumption of a diet that contains a variety of foods from all the food categories with an emphasis on fruits and vegetables; fat-free and low-fat dairy products; cereal and grain products; legumes and nuts; and fish, poultry, and/or extra lean meats.    Excessive food intake, especially of foods high in saturated and trans fats, sugar, and salt, should be avoided.    Adequate water intake of roughly 1/2 of your weight in pounds, should equal the ounces of water per day you should drink.  So for instance, if you're 200 pounds, that would be 100 ounces of water per day.         Mediterranean Diet  Why follow it? Research shows. . Those who follow the Mediterranean diet have a reduced risk of heart disease  . The diet is associated with a reduced incidence of Parkinson's and  Alzheimer's diseases . People following the diet may have longer life expectancies and lower rates of chronic diseases  . The Dietary Guidelines for Americans recommends the Mediterranean diet as an eating plan to promote health and prevent disease  What Is the Mediterranean Diet?  . Healthy eating plan based on typical foods and recipes of Mediterranean-style cooking . The diet is primarily a plant based diet; these foods should make up a majority of meals   Starches - Plant based foods should make up a majority of meals - They are an important sources of vitamins, minerals, energy, antioxidants, and fiber - Choose whole grains, foods high in fiber and minimally processed items  - Typical grain sources include wheat, oats, barley, corn, brown rice, bulgar, farro, millet, polenta, couscous  - Various types of beans include chickpeas, lentils, fava beans, black beans, white beans   Fruits  Veggies - Large quantities of antioxidant rich fruits & veggies; 6 or more servings  - Vegetables can be eaten raw or lightly drizzled with oil and cooked  - Vegetables common to the traditional Mediterranean Diet include: artichokes, arugula, beets, broccoli, brussel sprouts, cabbage, carrots, celery, collard greens, cucumbers, eggplant, kale, leeks, lemons, lettuce, mushrooms, okra, onions, peas, peppers, potatoes, pumpkin, radishes, rutabaga, shallots, spinach, sweet potatoes, turnips, zucchini - Fruits common to the Mediterranean Diet include: apples, apricots, avocados, cherries, clementines, dates, figs, grapefruits, grapes, melons, nectarines, oranges, peaches, pears, pomegranates, strawberries, tangerines  Fats - Replace butter and margarine with healthy oils, such as olive oil, canola oil, and tahini  - Limit nuts to no more than a handful a day  - Nuts include walnuts, almonds, pecans, pistachios, pine nuts  - Limit or avoid candied, honey roasted or heavily salted nuts - Olives are central to the  Mediterranean diet - can be eaten whole or used in a variety of dishes   Meats Protein - Limiting red meat: no more than a few times a month - When eating red meat: choose lean cuts and keep the portion to the size of deck of cards - Eggs: approx. 0 to 4 times a week  - Fish and lean poultry: at least 2 a week  - Healthy protein sources include, chicken, [place], lean beef, lamb - Increase intake of seafood such as tuna, salmon, trout, mackerel, shrimp, scallops - Avoid or limit high fat processed meats such as sausage and bacon  Dairy - Include moderate amounts of low fat dairy products  - Focus on healthy dairy such as fat free yogurt, skim milk, low or reduced fat cheese - Limit dairy products higher in fat such as whole or 2% milk, cheese, ice cream  Alcohol - Moderate amounts of red wine is ok  - No more than 5 oz daily for women (all ages) and men older than age 48  - No more than 10 oz of wine daily for men younger than 48  Other - Limit sweets and other desserts  - Use herbs and spices instead of salt to flavor foods  - Herbs and spices common to the traditional Mediterranean Diet include: basil, bay leaves, chives, cloves, cumin, fennel, garlic, lavender, marjoram, mint, oregano, parsley, pepper, rosemary, sage, savory, sumac, tarragon, thyme   It's not just a diet, it's a lifestyle:  . The Mediterranean diet includes lifestyle factors typical of those in the region  . Foods, drinks and meals are best eaten with others and savored . Daily physical activity is important for overall good health . This could be strenuous exercise like running and aerobics . This could also be more leisurely activities such as walking, housework, yard-work, or taking the stairs . Moderation is the key; a balanced and healthy diet accommodates most foods and drinks . Consider portion sizes and frequency of consumption of certain foods   Meal Ideas & Options:  . Breakfast:  o Whole wheat toast or whole  wheat English muffins with peanut butter & hard boiled egg o Steel cut oats topped with apples & cinnamon and skim milk  o Fresh fruit: banana, strawberries, melon, berries, peaches  o Smoothies: strawberries, bananas, greek yogurt, peanut butter o Low fat greek yogurt with blueberries and granola  o Egg white omelet with spinach and mushrooms o Breakfast couscous: whole wheat couscous, apricots, skim milk, cranberries  . Sandwiches:  o Hummus and grilled vegetables (peppers, zucchini, squash) on whole wheat bread   o Grilled chicken on whole wheat pita with lettuce, tomatoes, cucumbers or tzatziki  o Tuna salad on whole wheat bread: tuna salad made with greek yogurt, olives, red peppers, capers, green onions o Garlic rosemary lamb pita: lamb sauted with garlic, rosemary, salt & pepper; add lettuce, cucumber, greek yogurt to pita - flavor with lemon juice and black pepper  . Seafood:  o Mediterranean grilled salmon, seasoned with garlic, basil, parsley, lemon juice and black pepper o Shrimp, lemon, and spinach whole-grain pasta salad made with low fat greek yogurt  o Seared scallops with lemon orzo  o Seared tuna steaks seasoned salt, pepper, coriander topped with tomato mixture of olives, tomatoes, olive oil, minced garlic, parsley, green onions and cappers  . Meats:  o Herbed greek chicken salad with kalamata olives, cucumber, feta  o Red bell peppers stuffed with spinach, bulgur, lean ground beef (or lentils) & topped with feta   o Kebabs: skewers of chicken, tomatoes, onions, zucchini, squash  o [place] burgers: made with red onions, mint, dill, lemon juice, feta cheese topped with roasted red peppers . Vegetarian o Cucumber salad: cucumbers, artichoke hearts, celery, red onion, feta cheese, tossed in olive oil & lemon juice  o Hummus and whole grain pita points with a greek salad (lettuce, tomato, feta, olives, cucumbers, red onion) o Lentil soup with celery, carrots made with vegetable  broth, garlic, salt and pepper  o Tabouli salad: parsley, bulgur, mint, scallions, cucumbers, tomato, radishes, lemon juice, olive oil, salt and pepper.

## 2016-11-11 NOTE — Progress Notes (Signed)
Impression and Recommendations:    1. h/o Complete heart block- has PM ([place])   2. Essential hypertension   3. H/O noncompliance with medical treatment, presenting hazards to health   4. h/o Fatigue due to excessive exertion, subsequent encounter   5. Vitamin D insufficiency   6. Vitamin B12 deficiency      No problem-specific Assessment & Plan notes found for this encounter.   The patient was counseled, risk factors were discussed, anticipatory guidance given.   New Prescriptions   No medications on file     Discontinued Medications   No medications on file     Modified Medications   No medications on file      No orders of the defined types were placed in this encounter.    Gross side effects, risk and benefits, and alternatives of medications and treatment plan in general discussed with patient.  Patient is aware that all medications have potential side effects and we are unable to predict every side effect or drug-drug interaction that may occur.   Patient will call with any questions prior to using medication if they have concerns.  Expresses verbal understanding and consents to current therapy and treatment regimen.  No barriers to understanding were identified.  Red flag symptoms and signs discussed in detail.  Patient expressed understanding regarding what to do in case of emergency\urgent symptoms  Please see AVS handed out to patient at the end of our visit for further patient instructions/ counseling done pertaining to today's office visit.   Return for come in for CPE and fasting labs/ updating immunizations etc.     Note: This document was prepared using Dragon voice recognition software and may include unintentional dictation errors.  Mellody Dance 4:08  PM --------------------------------------------------------------------------------------------------------------------------------------------------------------------------------------------------------------------------------------------    Subjective:    CC:  Chief Complaint  Patient presents with  . Hypertension  . Sinusitis    sore throat, nasal congestion at times, sneezing     HPI: George [name] is a 66 y.o. male who presents to [place] [facility] today for issues as discussed below.  - Sees urology- [facility]- last seen early 2018- They did a prostate and testicular exam at that time.  Patient will go yearly  - Last time I saw patient for chronic care was 7 of 2017.  Patient has been doing good.   Pt got in touch with his Cards doc- recently sent him 2 transmissions- was not symptomatic.   Seeing them again in November.  BP - For about 3 wks--> BP has been up and down.  140 /72, 130's/ 80's,   Pulse- 58-62 at homes.  No exercise intolance, no dizziness, no symptoms.  No chest pain, heart palpitations, visual changes, headaches etc.   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    - drinks a 2 L caffeinated Dr pepper per day at least.  Water--> 2 bottles per day if that.   No problems updated.   Wt Readings from Last 3 Encounters:  11/11/16 167 lb (75.8 kg)  08/21/16 161 lb (73 kg)  04/19/16 160 lb (72.6 kg)   BP Readings from Last 3 Encounters:  11/11/16 137/83  08/21/16 (!) 152/89  04/19/16 134/82   Pulse Readings from Last 3 Encounters:  11/11/16 71  08/21/16 (!) 52  04/19/16 (!) 57   BMI Readings from Last 3  Encounters:  11/11/16 25.39 kg/m  08/21/16 24.48 kg/m  04/19/16 24.33 kg/m     Patient Care Team    Relationship Specialty Notifications Start End  Mellody Dance, DO PCP - General Family Medicine  09/14/15      Patient Active Problem List   Diagnosis Date  Noted  . Dizziness 08/21/2016  . BBBB (bilateral bundle branch block) 10/18/2015  . Complete heart block ([place]) 10/18/2015  . Vitamin B12 deficiency 10/06/2015  . Vitamin D insufficiency 10/06/2015  . Bradycardia 10/06/2015  . Hereditary hyperbilirubinemia 10/06/2015  . H/O noncompliance with medical treatment, presenting hazards to health 09/19/2015  . Peyronie's disease 09/19/2015  . DOE (dyspnea on exertion) 09/19/2015  . Unexplained weight loss 09/14/2015  . Essential hypertension 09/14/2015  . h/o Non-compliance: Has not seen a doc in over 13 years 09/14/2015  . Fatigue 09/14/2015    Past Medical history, Surgical history, Family history, Social history, Allergies and Medications have been entered into the medical record, reviewed and changed as needed.    Current Meds  Medication Sig  . carvedilol (COREG) 25 MG tablet TAKE 1 TABLET (25 MG TOTAL) BY MOUTH 2 (TWO) TIMES DAILY WITH A MEAL.    Allergies:  No Known Allergies   Review of Systems: General:   Denies fever, chills, unexplained weight loss.  Optho/Auditory:   Denies visual changes, blurred vision/LOV Respiratory:   Denies wheeze, DOE more than baseline levels.  Cardiovascular:   Denies chest pain, palpitations, new onset peripheral edema  Gastrointestinal:   Denies nausea, vomiting, diarrhea, abd pain.  Genitourinary: Denies dysuria, freq/ urgency, flank pain or discharge from genitals.  Endocrine:     Denies hot or cold intolerance, polyuria, polydipsia. Musculoskeletal:   Denies unexplained myalgias, joint swelling, unexplained arthralgias, gait problems.  Skin:  Denies new onset rash, suspicious lesions Neurological:     Denies dizziness, unexplained weakness, numbness  Psychiatric/Behavioral:   Denies mood changes, suicidal or homicidal ideations, hallucinations    Objective:   Blood pressure 137/83, pulse 71, weight 167 lb (75.8 kg). Body mass index is 25.39 kg/m. General:  Well Developed, well  nourished, appropriate for stated age.  Neuro:  Alert and oriented,  extra-ocular muscles intact  HEENT:  Normocephalic, atraumatic, neck supple, no carotid bruits appreciated  Skin:  no gross rash, warm, pink. Cardiac:  RRR, S1 S2 Respiratory:  ECTA B/L and A/P, Not using accessory muscles, speaking in full sentences- unlabored. Vascular:  Ext warm, no cyanosis apprec.; cap RF less 2 sec. Psych:  No HI/SI, judgement and insight good, Euthymic mood. Full Affect.

## 2016-11-12 ENCOUNTER — Encounter: Payer: Self-pay | Admitting: Family Medicine

## 2016-11-13 ENCOUNTER — Ambulatory Visit (INDEPENDENT_AMBULATORY_CARE_PROVIDER_SITE_OTHER): Payer: BLUE CROSS/BLUE SHIELD | Admitting: Family Medicine

## 2016-11-13 ENCOUNTER — Telehealth: Payer: Self-pay | Admitting: Family Medicine

## 2016-11-13 ENCOUNTER — Encounter: Payer: Self-pay | Admitting: Family Medicine

## 2016-11-13 VITALS — BP 118/75 | HR 69 | Ht 68.0 in | Wt 163.1 lb

## 2016-11-13 DIAGNOSIS — Z131 Encounter for screening for diabetes mellitus: Secondary | ICD-10-CM

## 2016-11-13 DIAGNOSIS — I10 Essential (primary) hypertension: Secondary | ICD-10-CM

## 2016-11-13 DIAGNOSIS — R5383 Other fatigue: Secondary | ICD-10-CM | POA: Diagnosis not present

## 2016-11-13 DIAGNOSIS — N486 Induration penis plastica: Secondary | ICD-10-CM

## 2016-11-13 DIAGNOSIS — R634 Abnormal weight loss: Secondary | ICD-10-CM

## 2016-11-13 DIAGNOSIS — E559 Vitamin D deficiency, unspecified: Secondary | ICD-10-CM

## 2016-11-13 DIAGNOSIS — Z13220 Encounter for screening for lipoid disorders: Secondary | ICD-10-CM

## 2016-11-13 DIAGNOSIS — Z0000 Encounter for general adult medical examination without abnormal findings: Secondary | ICD-10-CM

## 2016-11-13 DIAGNOSIS — Z23 Encounter for immunization: Secondary | ICD-10-CM

## 2016-11-13 DIAGNOSIS — I452 Bifascicular block: Secondary | ICD-10-CM | POA: Diagnosis not present

## 2016-11-13 DIAGNOSIS — E538 Deficiency of other specified B group vitamins: Secondary | ICD-10-CM | POA: Diagnosis not present

## 2016-11-13 NOTE — Patient Instructions (Signed)

## 2016-11-13 NOTE — Progress Notes (Signed)
Male physical  Impression and Recommendations:    1. Health maintenance examination   2. Need for Tdap vaccination   3. Screening for diabetes mellitus   4. Screening cholesterol level   5. Essential hypertension   6. Other fatigue   7. Vitamin B12 deficiency   8. Vitamin D insufficiency   9. Hereditary hyperbilirubinemia   10. Unexplained weight loss   11. Peyronie's disease   12. BBBB (bilateral bundle branch block)    1) Anticipatory Guidance: Discussed importance of wearing a seatbelt while driving, not texting while driving;   sunscreen when outside along with skin surveillance; eating a balanced and modest diet; physical activity at least 25 minutes per day or 150 min/ week moderate to intense activity.  2) Immunizations / Screenings / Labs:  All immunizations are up-to-date per recommendations or will be updated today. Patient is due for dental and vision screens which pt will schedule independently. Will obtain CBC, CMP, HgA1c, Lipid panel, TSH and vit D when fasting, if not already done recently.   3) Weight:  BMI meaning discussed with patient.  Discussed goal of losing 5-10% of current body weight which would improve overall feelings of well being and improve objective health data. Improve nutrient density of diet through increasing intake of fruits and vegetables and decreasing saturated fats, white flour products and refined sugars.    Gross side effects, risk and benefits, and alternatives of medications discussed with patient.  Patient is aware that all medications have potential side effects and we are unable to predict every side effect or drug-drug interaction that may occur.  Expresses verbal understanding and consents to current therapy plan and treatment regimen.  Follow-up preventative CPE in 1 year. Follow-up office visit pending lab work.  F/up sooner for chronic care management and/or prn   Orders Placed This Encounter  Procedures  . Tdap vaccine  greater than or equal to 7yo IM  . CBC With Differential    Standing Status:   Future    Number of Occurrences:   1    Standing Expiration Date:   11/13/2017  . Comprehensive metabolic panel    Standing Status:   Future    Number of Occurrences:   1    Standing Expiration Date:   11/13/2017  . Hemoglobin A1c    Standing Status:   Future    Number of Occurrences:   1    Standing Expiration Date:   11/13/2017  . Lipid panel    Standing Status:   Future    Number of Occurrences:   1    Standing Expiration Date:   11/13/2017  . TSH    Standing Status:   Future    Number of Occurrences:   1    Standing Expiration Date:   11/13/2017  . VITAMIN D 25 Hydroxy (Vit-D Deficiency, Fractures)    Standing Status:   Future    Number of Occurrences:   1    Standing Expiration Date:   11/13/2017  . T4, free  . Vitamin B12   Patient's Medications  New Prescriptions   No medications on file  Previous Medications   CARVEDILOL (COREG) 25 MG TABLET    TAKE 1 TABLET (25 MG TOTAL) BY MOUTH 2 (TWO) TIMES DAILY WITH A MEAL.   LORATADINE (CLARITIN) 10 MG TABLET    Take 10 mg by mouth daily.   MAGNESIUM 200 MG TABS    Take 1 tablet by mouth daily.  VITAMIN E 100 UNIT CAPSULE    Take 100 Units by mouth daily.  Modified Medications   No medications on file  Discontinued Medications   No medications on file   Please see AVS handed out to patient at the end of our visit for further patient instructions/ counseling done pertaining to today's office visit.    Subjective:    CC: CPE  HPI: George [name] is a 66 y.o. male who presents to [place] at [facility] today for a yearly health maintenance exam.     Health Maintenance Summary Reviewed and updated, unless pt declines services.  Aspirin: administering 81 mg daily Colonoscopy:     Done November 2017.  Had a benign polyp and was told to repeat 5 years.  Results air in his chart.  Tdap:  needs TDap  Pneumovax/PPSV23:  Patient  wants to think about it we gave him vaccine information sheet  Prevnar 13/PCV13:  - Patient wants to think about it.  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  Had workup for it.  Benign.   Health Maintenance  Topic Date Due  . INFLUENZA VACCINE  10/16/2016  . Hepatitis C Screening  11/11/2028 (Originally 06-15-1950)  . HIV Screening  11/11/2028 (Originally 02/09/1966)  . COLONOSCOPY  02/04/2026  . TETANUS/TDAP  11/14/2026      Wt Readings from Last 3 Encounters:  11/13/16 163 lb 1.6 oz (74 kg)  11/11/16 167 lb (75.8 kg)  08/21/16 161 lb (73 kg)   BP Readings from Last 3 Encounters:  11/13/16 118/75  11/11/16 137/83  08/21/16 (!) 152/89   Pulse Readings from Last 3 Encounters:  11/13/16 69  11/11/16 71  08/21/16 (!) 52    Patient Active Problem List   Diagnosis Date Noted  . Dizziness 08/21/2016  . BBBB (bilateral bundle branch block) 10/18/2015  . Complete heart block ([place]) 10/18/2015  . Vitamin B12 deficiency 10/06/2015  . Vitamin D insufficiency 10/06/2015  . Bradycardia 10/06/2015  . Hereditary hyperbilirubinemia 10/06/2015  . H/O noncompliance with medical treatment, presenting hazards to health 09/19/2015  . Peyronie's disease 09/19/2015  . DOE (dyspnea on exertion) 09/19/2015  . Unexplained weight loss 09/14/2015  . Essential hypertension 09/14/2015  . h/o Non-compliance: Has not seen a doc in over 13 years 09/14/2015  . Fatigue 09/14/2015    Past Medical History:  Diagnosis Date  . Arthritis THUMBS  . LBBB (left bundle branch block)   .  Peyronie's disease     Past Surgical History:  Procedure Laterality Date  . CARDIOVASCULAR STRESS TEST  10-18-2002  DR  CRENSHAW   NORMAL ADENOSINE CARDIOLITE/ EF 60%  . CATARACT EXTRACTION W/ INTRAOCULAR LENS IMPLANT  2006   LEFT EYE  . EP IMPLANTABLE DEVICE N/A 10/18/2015   Procedure: BiV Pacemaker Insertion CRT-P;  Surgeon: [name], MD;  Location: [facility];  Service: Cardiovascular;  Laterality: N/A;  . LAPAROSCOPIC CHOLECYSTECTOMY  10-09-2007  . NESBIT PROCEDURE  01/17/2012   Procedure: NESBIT  PROCEDURE;  Surgeon: [name], MD;  Location: [facility];  Service: Urology;  Laterality: N/A;  16 dot plication   . [place], LEFT EYE  1993  . REPAIR LEFT INGUINAL HERNIA  2005    Family History  Problem Relation Age of Onset  . Diabetes Mother   . Hypertension Mother   . Hyperlipidemia Mother   . Heart disease Father   . Hyperlipidemia Father   . Hypertension Father   . Arrhythmia Father        paf  . Healthy Daughter   . Healthy Son   . Heart disease Paternal Grandfather   . Heart attack Paternal Grandfather     History  Drug Use No  ,  History  Alcohol Use No  ,  History  Smoking Status  . Never Smoker  Smokeless Tobacco  . Never Used  ,  History  Sexual Activity  . Sexual activity: Yes    Patient's Medications  New Prescriptions   No medications on file  Previous Medications   CARVEDILOL (COREG) 25 MG TABLET    TAKE 1 TABLET (25 MG TOTAL) BY MOUTH 2 (TWO) TIMES DAILY WITH A MEAL.   LORATADINE (CLARITIN) 10 MG TABLET    Take 10 mg by mouth daily.   MAGNESIUM 200 MG TABS    Take 1 tablet by mouth daily.   VITAMIN E 100 UNIT CAPSULE    Take 100 Units by mouth daily.  Modified Medications   No medications on file  Discontinued Medications   No medications on file    Patient has no known allergies.  Review of Systems: General:   Denies fever, chills, unexplained weight loss.  Optho/Auditory:   Denies  visual changes, blurred vision/LOV Respiratory:   Denies SOB, DOE more than baseline levels.  Cardiovascular:   Denies chest pain, palpitations, new onset peripheral edema  Gastrointestinal:   Denies nausea, vomiting, diarrhea.  Genitourinary: Denies dysuria, freq/ urgency, flank pain or discharge from genitals.  Endocrine:     Denies hot or cold intolerance, polyuria, polydipsia. Musculoskeletal:   Denies unexplained myalgias, joint swelling, unexplained arthralgias, gait problems.  Skin:  Denies rash, suspicious lesions Neurological:     Denies dizziness, unexplained weakness, numbness  Psychiatric/Behavioral:   Denies mood changes, suicidal or homicidal ideations, hallucinations    Objective:     Blood pressure 118/75, pulse 69, height 5\' 8"  (1.727 m), weight 163 lb 1.6 oz (74 kg). Body mass index is 24.8 kg/m. General Appearance:    Alert, cooperative, no distress, appears stated age  Head:    Normocephalic, without obvious abnormality, atraumatic  Eyes:    PERRL, conjunctiva/corneas clear, EOM's intact, fundi    benign, both eyes  Ears:    Normal TM's and external ear canals, both ears  Nose:   Nares normal, septum midline, mucosa normal, no drainage    or sinus tenderness  Throat:   Lips w/o lesion, mucosa moist, and tongue normal; teeth and   gums normal  Neck:   Supple, symmetrical, trachea midline, no adenopathy;    thyroid:  no enlargement/tenderness/nodules; no carotid   bruit or JVD  Back:     Symmetric, no curvature, ROM normal, no CVA tenderness  Lungs:     Clear to auscultation bilaterally, respirations unlabored, no       Wh/ R/ R  Chest Wall:    No tenderness or gross deformity; normal excursion   Heart:    Regular rate and  rhythm, S1 and S2 normal, no murmur, rub   or gallop  Abdomen:     Soft, non-tender, bowel sounds active all four quadrants, NO   G/R/R, no masses, no organomegaly  Genitalia:    Ext genitalia: without lesion; Right testes with a couple of one  relations.  No tenderness to palpation.  no penile rash or discharge, no hernias appreciated   Rectal:    Normal tone, prostate WNL's and equal b/l, no tenderness; guaiac negative stool  Extremities:   Extremities normal, atraumatic, no cyanosis or gross edema  Pulses:   2+ and symmetric all extremities  Skin:   Warm, dry, Skin color, texture, turgor normal, no obvious rashes or lesions  M-Sk:   Ambulates * 4 w/o difficulty, no gross deformities, tone WNL  Neurologic:   CNII-XII intact, normal strength, sensation and reflexes    Throughout Psych:  No HI/SI, judgement and insight good, Euthymic mood. Full Affect.

## 2016-11-13 NOTE — Telephone Encounter (Signed)
We need to get patient's path results regarding his recent colonoscopy done in 2017.  Please call for these results.

## 2016-11-14 LAB — COMPREHENSIVE METABOLIC PANEL
A/G RATIO: 1.4 (ref 1.2–2.2)
ALT: 16 IU/L (ref 0–44)
AST: 20 IU/L (ref 0–40)
Albumin: 4.1 g/dL (ref 3.6–4.8)
Alkaline Phosphatase: 94 IU/L (ref 39–117)
BILIRUBIN TOTAL: 0.9 mg/dL (ref 0.0–1.2)
BUN / CREAT RATIO: 11 (ref 10–24)
BUN: 9 mg/dL (ref 8–27)
CHLORIDE: 100 mmol/L (ref 96–106)
CO2: 24 mmol/L (ref 20–29)
Calcium: 9.5 mg/dL (ref 8.6–10.2)
Creatinine, Ser: 0.85 mg/dL (ref 0.76–1.27)
GFR calc Af Amer: 107 mL/min/{1.73_m2} (ref >59)
GFR calc non Af Amer: 93 mL/min/{1.73_m2} (ref >59)
GLOBULIN, TOTAL: 2.9 g/dL (ref 1.5–4.5)
Glucose: 104 mg/dL — ABNORMAL HIGH (ref 65–99)
Potassium: 4.3 mmol/L (ref 3.5–5.2)
Sodium: 138 mmol/L (ref 134–144)
TOTAL PROTEIN: 7 g/dL (ref 6.0–8.5)

## 2016-11-14 LAB — LIPID PANEL
Chol/HDL Ratio: 4.7 ratio (ref 0.0–5.0)
Cholesterol, Total: 191 mg/dL (ref 100–199)
HDL: 41 mg/dL (ref >39)
LDL Calculated: 124 mg/dL — ABNORMAL HIGH (ref 0–99)
TRIGLYCERIDES: 129 mg/dL (ref 0–149)
VLDL Cholesterol Cal: 26 mg/dL (ref 5–40)

## 2016-11-14 LAB — CBC WITH DIFFERENTIAL
Basophils Absolute: 0 10*3/uL (ref 0.0–0.2)
Basos: 1 %
EOS (ABSOLUTE): 0.2 10*3/uL (ref 0.0–0.4)
EOS: 4 %
HEMATOCRIT: 40.6 % (ref 37.5–51.0)
HEMOGLOBIN: 14.5 g/dL (ref 13.0–17.7)
IMMATURE GRANS (ABS): 0 10*3/uL (ref 0.0–0.1)
IMMATURE GRANULOCYTES: 0 %
LYMPHS ABS: 2.6 10*3/uL (ref 0.7–3.1)
LYMPHS: 40 %
MCH: 30.1 pg (ref 26.6–33.0)
MCHC: 35.7 g/dL (ref 31.5–35.7)
MCV: 84 fL (ref 79–97)
MONOCYTES: 9 %
Monocytes Absolute: 0.6 10*3/uL (ref 0.1–0.9)
Neutrophils Absolute: 3.1 10*3/uL (ref 1.4–7.0)
Neutrophils: 46 %
RBC: 4.82 x10E6/uL (ref 4.14–5.80)
RDW: 14.1 % (ref 12.3–15.4)
WBC: 6.5 10*3/uL (ref 3.4–10.8)

## 2016-11-14 LAB — VITAMIN D 25 HYDROXY (VIT D DEFICIENCY, FRACTURES): Vit D, 25-Hydroxy: 31.4 ng/mL (ref 30.0–100.0)

## 2016-11-14 LAB — HEMOGLOBIN A1C
Est. average glucose Bld gHb Est-mCnc: 108 mg/dL
Hgb A1c MFr Bld: 5.4 % (ref 4.8–5.6)

## 2016-11-14 LAB — T4, FREE: Free T4: 1.32 ng/dL (ref 0.82–1.77)

## 2016-11-14 LAB — VITAMIN B12

## 2016-11-14 LAB — TSH: TSH: 2.14 u[IU]/mL (ref 0.450–4.500)

## 2016-11-15 NOTE — Telephone Encounter (Signed)
Biopsy results (from colonoscopy done on 11\20\17) on 11\22\17 showed tubular adenoma.  Repeat 5 years.

## 2016-11-15 NOTE — Telephone Encounter (Signed)
We need to get patient's path results regarding his recent colonoscopy done in 2017.  Please call for these results.

## 2016-11-26 ENCOUNTER — Other Ambulatory Visit: Payer: Self-pay

## 2016-11-26 DIAGNOSIS — E538 Deficiency of other specified B group vitamins: Secondary | ICD-10-CM

## 2016-11-26 DIAGNOSIS — E559 Vitamin D deficiency, unspecified: Secondary | ICD-10-CM

## 2016-11-26 DIAGNOSIS — E785 Hyperlipidemia, unspecified: Secondary | ICD-10-CM

## 2016-11-27 ENCOUNTER — Telehealth: Payer: Self-pay | Admitting: Family Medicine

## 2016-11-27 NOTE — Telephone Encounter (Signed)
LVM for spouse that result letter was sent yesterday.  Advised spouse that if she would like to discuss the results prior to receiving the letter, she may call our office to discuss.  Charyl Bigger, CMA

## 2016-11-27 NOTE — Telephone Encounter (Signed)
Pt's wife Neoma Laming returned cll frm MA regarding patient's lab results--pls cll cell# [phone] to spk with her. --glh

## 2016-12-05 ENCOUNTER — Encounter: Payer: BLUE CROSS/BLUE SHIELD | Admitting: *Deleted

## 2016-12-05 ENCOUNTER — Telehealth: Payer: Self-pay | Admitting: Cardiology

## 2016-12-05 NOTE — Telephone Encounter (Signed)
LMOVM reminding pt to send remote transmission.   

## 2016-12-06 ENCOUNTER — Encounter: Payer: Self-pay | Admitting: Cardiology

## 2016-12-09 ENCOUNTER — Ambulatory Visit (INDEPENDENT_AMBULATORY_CARE_PROVIDER_SITE_OTHER): Payer: BLUE CROSS/BLUE SHIELD | Admitting: *Deleted

## 2016-12-09 DIAGNOSIS — I442 Atrioventricular block, complete: Secondary | ICD-10-CM | POA: Diagnosis not present

## 2016-12-09 NOTE — Progress Notes (Signed)
Remote pacemaker transmission.   

## 2016-12-10 LAB — CUP PACEART REMOTE DEVICE CHECK
Battery Remaining Longevity: 95 mo
Battery Voltage: 3.03 V
Brady Statistic AP VP Percent: 18.37 %
Brady Statistic AP VS Percent: 0.4 %
Brady Statistic RA Percent Paced: 19.64 %
Date Time Interrogation Session: 20180923041211
Implantable Lead Implant Date: 20170802
Implantable Lead Location: 753860
Implantable Lead Model: 5076
Implantable Pulse Generator Implant Date: 20170802
Lead Channel Impedance Value: 437 Ohm
Lead Channel Impedance Value: 494 Ohm
Lead Channel Impedance Value: 589 Ohm
Lead Channel Impedance Value: 760 Ohm
Lead Channel Impedance Value: 798 Ohm
Lead Channel Impedance Value: 931 Ohm
Lead Channel Pacing Threshold Amplitude: 0.75 V
Lead Channel Pacing Threshold Amplitude: 1 V
Lead Channel Sensing Intrinsic Amplitude: 14.5 mV
Lead Channel Sensing Intrinsic Amplitude: 2.5 mV
Lead Channel Setting Pacing Amplitude: 2 V
Lead Channel Setting Sensing Sensitivity: 2.8 mV
MDC IDC LEAD IMPLANT DT: 20170802
MDC IDC LEAD IMPLANT DT: 20170802
MDC IDC LEAD LOCATION: 753858
MDC IDC LEAD LOCATION: 753859
MDC IDC MSMT LEADCHNL LV IMPEDANCE VALUE: 1140 Ohm
MDC IDC MSMT LEADCHNL LV IMPEDANCE VALUE: 1159 Ohm
MDC IDC MSMT LEADCHNL LV IMPEDANCE VALUE: 1235 Ohm
MDC IDC MSMT LEADCHNL LV IMPEDANCE VALUE: 475 Ohm
MDC IDC MSMT LEADCHNL LV IMPEDANCE VALUE: 893 Ohm
MDC IDC MSMT LEADCHNL LV PACING THRESHOLD AMPLITUDE: 2.375 V
MDC IDC MSMT LEADCHNL LV PACING THRESHOLD PULSEWIDTH: 0.8 ms
MDC IDC MSMT LEADCHNL RA IMPEDANCE VALUE: 342 Ohm
MDC IDC MSMT LEADCHNL RA IMPEDANCE VALUE: 513 Ohm
MDC IDC MSMT LEADCHNL RA PACING THRESHOLD PULSEWIDTH: 0.4 ms
MDC IDC MSMT LEADCHNL RA SENSING INTR AMPL: 2.5 mV
MDC IDC MSMT LEADCHNL RV IMPEDANCE VALUE: 380 Ohm
MDC IDC MSMT LEADCHNL RV PACING THRESHOLD PULSEWIDTH: 0.4 ms
MDC IDC MSMT LEADCHNL RV SENSING INTR AMPL: 14.5 mV
MDC IDC SET LEADCHNL LV PACING AMPLITUDE: 3.5 V
MDC IDC SET LEADCHNL LV PACING PULSEWIDTH: 0.8 ms
MDC IDC SET LEADCHNL RA PACING AMPLITUDE: 1.5 V
MDC IDC SET LEADCHNL RV PACING PULSEWIDTH: 0.4 ms
MDC IDC STAT BRADY AS VP PERCENT: 78.02 %
MDC IDC STAT BRADY AS VS PERCENT: 3.21 %
MDC IDC STAT BRADY RV PERCENT PACED: 12.37 %

## 2016-12-12 ENCOUNTER — Encounter: Payer: Self-pay | Admitting: Cardiology

## 2016-12-20 ENCOUNTER — Ambulatory Visit: Payer: BLUE CROSS/BLUE SHIELD | Admitting: Family Medicine

## 2017-01-16 NOTE — Progress Notes (Signed)
Cardiology Office Note Date:  01/17/2017  Patient ID:  George [name] 13-Oct-1950, MRN [id number] PCP:  George Dance, DO  Cardiologist:  George [name] Electrophysiologist: George [name]    Chief Complaint: planned f/u  History of Present Illness: George [name] is a 66 y.o. male with history of HTN, , 2nd degree AVblock,II, s/p CRT-P [name] in to the office today to be seen for George [name].  He was last seen by him 02/02/16 with improving exertional capacity c/o palpitations and noting PVC's, subsequently seen by myself 02/16/16 noting only 94% BiVe pacing, NSVT and his coreg up-titrated further. 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  No dizziness, near syncope or syncope.   Device information:  MDT CRT-P, implanted 10/18/15, George [name]   Past Medical History:  Diagnosis Date  . Arthritis THUMBS  . LBBB (left bundle branch block)   . Peyronie's disease     Past Surgical History:  Procedure Laterality Date  . CARDIOVASCULAR STRESS TEST  10-18-2002  DR  CRENSHAW   NORMAL ADENOSINE CARDIOLITE/ EF 60%  . CATARACT EXTRACTION W/ INTRAOCULAR LENS IMPLANT  2006   LEFT EYE  . EP IMPLANTABLE DEVICE N/A 10/18/2015   Procedure: BiV Pacemaker Insertion CRT-P;  Surgeon: George Sprang, MD;  Location: [facility];  Service: Cardiovascular;  Laterality: N/A;  . LAPAROSCOPIC CHOLECYSTECTOMY  10-09-2007  . NESBIT PROCEDURE  01/17/2012   Procedure: NESBIT PROCEDURE;  Surgeon: George Jabs, MD;  Location[facility];  Service: Urology;  Laterality: N/A;  16 dot plication   . [place], LEFT EYE  1993  . REPAIR  LEFT INGUINAL HERNIA  2005    Current Outpatient Prescriptions  Medication Sig Dispense Refill  . carvedilol (COREG) 25 MG tablet TAKE 1 TABLET (25 MG TOTAL) BY MOUTH 2 (TWO) TIMES DAILY WITH A MEAL. 60 tablet 6  . loratadine (CLARITIN) 10 MG tablet Take 10 mg by mouth daily as needed.     . Magnesium 200 MG TABS Take 1 tablet by mouth daily.    . vitamin E 100 UNIT capsule Take 100 Units by mouth daily.     No current facility-administered medications for this visit.     Allergies:   Patient has no known allergies.   Social History:  The patient  reports that he has never smoked. He has never used smokeless tobacco. He reports that he does not drink alcohol or use drugs.   Family History:  The patient's family history includes Arrhythmia in his father; Diabetes in his mother; Healthy in his daughter and son; Heart attack in his paternal grandfather; Heart disease in his father and paternal grandfather; Hyperlipidemia in his father and mother; Hypertension in his father and mother.  ROS:  Please see the history of present illness.  All other systems are reviewed and otherwise negative.   PHYSICAL EXAM:  VS:  BP (!) 142/78   Pulse (!) 54   Ht 5\' 8"  (1.727 m)   Wt 165 lb (74.8 kg)   BMI 25.09 kg/m  BMI: Body mass index is 25.09 kg/m. Well nourished, well  developed, in no acute distress  HEENT: normocephalic, atraumatic  Neck: no JVD, carotid bruits or masses Cardiac:  RRR; no significant murmurs, no rubs, or gallops Lungs:  CTA b/l, no wheezing, rhonchi or rales  Abd: soft, nontender MS: no deformity or atrophy Ext: no edema  Skin: warm and dry, no rash Neuro:  No gross deficits appreciated Psych: euthymic mood, full affect  PPM site is stable, no tethering or discomfort   EKG:  SB 53bpm, RAD, measured QRS 119ms ICD interrogation done today and reviewed by myself:  Battery and lead measurements are good, no true NSVT, there are all 1:1longest 3 seconds.  He has 2-true AF  episodes, longest 29minute 26seconds, PVC's decreased to 60.4/hour, BiVe paicng % up to 96.6%   10/16/15: TTE Study Conclusions - Left ventricle: The cavity size was normal. Wall thickness was   normal. Systolic function was mildly reduced. The estimated   ejection fraction was in the range of 45% to 50%. Mild diffuse   hypokinesis with no identifiable regional variations. - Ventricular septum: Septal motion showed paradox. - Aortic valve: There was mild regurgitation. - Mitral valve: Systolic bowing without prolapse. There was mild   regurgitation. - Tricuspid valve: Mild prolapse. - Pulmonary arteries: PA peak pressure: 31 mm Hg (S). Impressions: - Second degree AV block and Incessant PVCs are present throughout   the study and limit evaluation of systolic and diastolic   function.    Recent Labs: 04/19/2016: Magnesium 2.1 11/13/2016: ALT 16; BUN 9; Creatinine, Ser 0.85; Hemoglobin 14.5; Potassium 4.3; Sodium 138; TSH 2.140  11/13/2016: Chol/HDL Ratio 4.7; Cholesterol, Total 191; HDL 41; LDL Calculated 124; Triglycerides 129   CrCl cannot be calculated (Patient's most recent lab result is older than the maximum 21 days allowed.).   Wt Readings from Last 3 Encounters:  01/17/17 165 lb (74.8 kg)  11/13/16 163 lb 1.6 oz (74 kg)  11/11/16 167 lb (75.8 kg)     Other studies reviewed: Additional studies/records reviewed today include: summarized above  ASSESSMENT AND PLAN:  1. Heart block s/p PPM     intact device function, no changes made     96.6 % BiVe pacing  2. Mild CM     Exam is euvolemic, Optivol looks good     No symptoms     PVCs are less     Will plan for an echo to revisit LVEF and decide about need/not for ACE/ARB tx  3. PVC's     down some more to 60/hour     No true NSVT      Disposition: continue q 3 month remotes, see him back in 6 months, sooner if needed.    Current medicines are reviewed at length with the patient today.  The patient did not have  any concerns regarding medicines.  Haywood Lasso, PA-C 01/17/2017 12:01 PM     [address] [address] [phone] (office)  [phone] (fax)

## 2017-01-17 ENCOUNTER — Encounter (INDEPENDENT_AMBULATORY_CARE_PROVIDER_SITE_OTHER): Payer: Self-pay

## 2017-01-17 ENCOUNTER — Ambulatory Visit (INDEPENDENT_AMBULATORY_CARE_PROVIDER_SITE_OTHER): Payer: BLUE CROSS/BLUE SHIELD | Admitting: Physician Assistant

## 2017-01-17 VITALS — BP 142/78 | HR 54 | Ht 68.0 in | Wt 165.0 lb

## 2017-01-17 DIAGNOSIS — I493 Ventricular premature depolarization: Secondary | ICD-10-CM | POA: Diagnosis not present

## 2017-01-17 DIAGNOSIS — I429 Cardiomyopathy, unspecified: Secondary | ICD-10-CM

## 2017-01-17 DIAGNOSIS — Z950 Presence of cardiac pacemaker: Secondary | ICD-10-CM | POA: Diagnosis not present

## 2017-01-17 NOTE — Patient Instructions (Addendum)
Medication Instructions:   Your physician recommends that you continue on your current medications as directed. Please refer to the Current Medication list given to you today.   If you need a refill on your cardiac medications before your next appointment, please call your pharmacy.  Labwork: NONE ORDERED  TODAY    Testing/Procedures: AFTER HOLIDAY Your physician has requested that you have an echocardiogram. Echocardiography is a painless test that uses sound waves to create images of your heart. It provides your doctor with information about the size and shape of your heart and how well your heart's chambers and valves are working. This procedure takes approximately one hour. There are no restrictions for this procedure.    Follow-Up: Your physician wants you to follow-up in:  [place] will receive a reminder letter in the mail two months in advance. If you don't receive a letter, please call our office to schedule the follow-up appointment.    Remote monitoring is used to monitor your Pacemaker of ICD from home. This monitoring reduces the number of office visits required to check your device to one time per year. It allows [place] to keep an eye on the functioning of your device to ensure it is working properly. You are scheduled for a device check from home on .  03-12-17   You may send your transmission at any time that day. If you have a wireless device, the transmission will be sent automatically. After your physician reviews your transmission, you will receive a postcard with your next transmission date.     Any Other Special Instructions Will Be Listed Below (If Applicable).

## 2017-01-31 ENCOUNTER — Other Ambulatory Visit: Payer: Self-pay

## 2017-01-31 ENCOUNTER — Ambulatory Visit (HOSPITAL_COMMUNITY): Payer: BLUE CROSS/BLUE SHIELD | Attending: Cardiology

## 2017-01-31 DIAGNOSIS — I080 Rheumatic disorders of both mitral and aortic valves: Secondary | ICD-10-CM | POA: Diagnosis not present

## 2017-01-31 DIAGNOSIS — I447 Left bundle-branch block, unspecified: Secondary | ICD-10-CM | POA: Diagnosis not present

## 2017-01-31 DIAGNOSIS — I429 Cardiomyopathy, unspecified: Secondary | ICD-10-CM | POA: Diagnosis not present

## 2017-01-31 DIAGNOSIS — Z8249 Family history of ischemic heart disease and other diseases of the circulatory system: Secondary | ICD-10-CM | POA: Insufficient documentation

## 2017-02-10 ENCOUNTER — Telehealth: Payer: Self-pay | Admitting: *Deleted

## 2017-02-10 NOTE — Telephone Encounter (Signed)
-----   Message from [facility], [place] sent at 02/03/2017 12:04 PM EST ----- Please let the patient know his echo looks good, heart strength has improved.  Thanks [employer]

## 2017-02-10 NOTE — Telephone Encounter (Signed)
LM OF NORMAL RESULTS PT INSTRUCTED TO CALL OFFICE IF ANY QUESTIONS OR CONCERNS

## 2017-03-12 ENCOUNTER — Telehealth: Payer: Self-pay | Admitting: Cardiology

## 2017-03-12 ENCOUNTER — Ambulatory Visit (INDEPENDENT_AMBULATORY_CARE_PROVIDER_SITE_OTHER): Payer: BLUE CROSS/BLUE SHIELD | Admitting: *Deleted

## 2017-03-12 DIAGNOSIS — I442 Atrioventricular block, complete: Secondary | ICD-10-CM | POA: Diagnosis not present

## 2017-03-12 NOTE — Progress Notes (Signed)
Remote pacemaker transmission.   

## 2017-03-12 NOTE — Telephone Encounter (Signed)
LMOVM reminding pt to send remote transmission.   

## 2017-03-13 ENCOUNTER — Encounter: Payer: Self-pay | Admitting: Cardiology

## 2017-03-13 LAB — CUP PACEART REMOTE DEVICE CHECK
Battery Remaining Longevity: 119 mo
Brady Statistic AP VS Percent: 0.36 %
Brady Statistic AS VS Percent: 3.35 %
Brady Statistic RV Percent Paced: 22.48 %
Implantable Lead Implant Date: 20170802
Implantable Lead Implant Date: 20170802
Implantable Lead Location: 753858
Implantable Lead Location: 753860
Implantable Lead Model: 5076
Lead Channel Impedance Value: 1197 Ohm
Lead Channel Impedance Value: 1235 Ohm
Lead Channel Impedance Value: 323 Ohm
Lead Channel Impedance Value: 437 Ohm
Lead Channel Impedance Value: 494 Ohm
Lead Channel Impedance Value: 513 Ohm
Lead Channel Impedance Value: 589 Ohm
Lead Channel Impedance Value: 817 Ohm
Lead Channel Impedance Value: 874 Ohm
Lead Channel Pacing Threshold Amplitude: 0.75 V
Lead Channel Pacing Threshold Amplitude: 2 V
Lead Channel Pacing Threshold Pulse Width: 0.4 ms
Lead Channel Pacing Threshold Pulse Width: 0.4 ms
Lead Channel Pacing Threshold Pulse Width: 0.8 ms
Lead Channel Sensing Intrinsic Amplitude: 2.125 mV
Lead Channel Sensing Intrinsic Amplitude: 2.125 mV
Lead Channel Setting Pacing Amplitude: 1.75 V
Lead Channel Setting Pacing Amplitude: 2 V
Lead Channel Setting Pacing Pulse Width: 0.4 ms
MDC IDC LEAD IMPLANT DT: 20170802
MDC IDC LEAD LOCATION: 753859
MDC IDC MSMT BATTERY VOLTAGE: 3.02 V
MDC IDC MSMT LEADCHNL LV IMPEDANCE VALUE: 1197 Ohm
MDC IDC MSMT LEADCHNL LV IMPEDANCE VALUE: 912 Ohm
MDC IDC MSMT LEADCHNL LV IMPEDANCE VALUE: 969 Ohm
MDC IDC MSMT LEADCHNL RA IMPEDANCE VALUE: 570 Ohm
MDC IDC MSMT LEADCHNL RV IMPEDANCE VALUE: 361 Ohm
MDC IDC MSMT LEADCHNL RV PACING THRESHOLD AMPLITUDE: 1 V
MDC IDC MSMT LEADCHNL RV SENSING INTR AMPL: 16.5 mV
MDC IDC MSMT LEADCHNL RV SENSING INTR AMPL: 16.5 mV
MDC IDC PG IMPLANT DT: 20170802
MDC IDC SESS DTM: 20181226194650
MDC IDC SET LEADCHNL LV PACING AMPLITUDE: 2.5 V
MDC IDC SET LEADCHNL LV PACING PULSEWIDTH: 0.8 ms
MDC IDC SET LEADCHNL RV SENSING SENSITIVITY: 2.8 mV
MDC IDC STAT BRADY AP VP PERCENT: 16.51 %
MDC IDC STAT BRADY AS VP PERCENT: 79.78 %
MDC IDC STAT BRADY RA PERCENT PACED: 17.02 %

## 2017-05-09 ENCOUNTER — Other Ambulatory Visit: Payer: Self-pay | Admitting: Physician Assistant

## 2017-05-13 ENCOUNTER — Encounter: Payer: Self-pay | Admitting: Family Medicine

## 2017-05-13 ENCOUNTER — Ambulatory Visit (INDEPENDENT_AMBULATORY_CARE_PROVIDER_SITE_OTHER): Payer: BLUE CROSS/BLUE SHIELD | Admitting: Family Medicine

## 2017-05-13 VITALS — BP 130/74 | HR 54 | Ht 68.0 in | Wt 166.7 lb

## 2017-05-13 DIAGNOSIS — E559 Vitamin D deficiency, unspecified: Secondary | ICD-10-CM | POA: Diagnosis not present

## 2017-05-13 DIAGNOSIS — E785 Hyperlipidemia, unspecified: Secondary | ICD-10-CM

## 2017-05-13 DIAGNOSIS — E538 Deficiency of other specified B group vitamins: Secondary | ICD-10-CM | POA: Diagnosis not present

## 2017-05-13 DIAGNOSIS — I10 Essential (primary) hypertension: Secondary | ICD-10-CM

## 2017-05-13 DIAGNOSIS — I442 Atrioventricular block, complete: Secondary | ICD-10-CM | POA: Diagnosis not present

## 2017-05-13 MED ORDER — VITAMIN B-12 1000 MCG PO TABS: 1000.0000 ug | ORAL_TABLET | Freq: Every day | ORAL | Status: DC

## 2017-05-13 MED ORDER — VITAMIN D3 125 MCG (5000 UT) PO TABS: ORAL_TABLET | ORAL | 3 refills | Status: DC

## 2017-05-13 NOTE — Progress Notes (Signed)
Impression and Recommendations:    1. Essential hypertension   2. Complete heart block ([place])   3. Hyperlipidemia LDL goal <100   4. Vitamin D insufficiency   5. B12 deficiency    Cardiology/HTN - Follows up with Dr. Virl Axe. - Last seen in cardiology on 03/12/2017.  Patient has complete heart block with a pacemaker placed. - Echocardiogram was 01/31/2017 which showed EF of 50-55% which was mildly improved from prior study. - Cardiology manages his carvedilol. - Continue on medications as prescribed.  - BP re-check in office: 130/74  Hyperlipidemia - LDL was up from prior, HDL was down.  The 10-year ASCVD risk score George [name]., et al., 2013) is: 18.8%   Values used to calculate the score:     Age: 45 years     Sex: Male     Is Non-Hispanic African American: No     Diabetic: No     Tobacco smoker: No     Systolic Blood Pressure: 361 mmHg     Is BP treated: Yes     HDL Cholesterol: 50 mg/dL     Total Cholesterol: 201 mg/dL  - On lab re-check, George [name] agrees that if his cholesterol is not significantly improved with LDL less than 100 and 10 year risk in an acceptble range, he agrees to go on Lipitor or another statin.  - Information provided to the patient on statins today.  - it is extremely important to drink half of body weight in ounces per day when start on statins.  - Understand that if he begins a new statin medication, we will need to check his liver enzymes in 6 weeks as well.  - Will monitor closely.  B12 Deficiency - Patient should take his B12 supplement regularly. - B12 1000 micrograms daily by mouth. - Will continue to monitor.  Vitamin D Deficiency - Continue Vitamin D supplementation as prescribed. - Taking Vitamin D capsules, 5000. - Will continue to monitor.  Health Maintenance - Advised patient to continue working toward exercising to improve health.    - PT will begin with 15 minutes of activity daily. Recommended that the patient  eventually strive for at least 150 minutes of cardio per week according to the [facility].   - Healthy dietary habits encouraged, including low-carb, and high amounts of lean protein in diet.   - Patient should also consume adequate amounts of water - half of body weight in oz of water per day. - Strongly emphasized that adequate hydration will improve the potential side effects of any medicines he is taking.  Follow-Up - Last labs 11/13/2016 - Re-check labs, including cholesterol, Vitamin D, B12. - Patient will come in near future for fasting blood work, lab only, at his convenience. - Return for regular OV in 4-6 months.    Education and routine counseling performed. Handouts provided.   Meds ordered this encounter  Medications  . Cholecalciferol (VITAMIN D3) 5000 units TABS    Sig: 5,000 IU OTC vitamin D3 daily.    Dispense:  90 tablet    Refill:  3  . vitamin B-12 (CYANOCOBALAMIN) 1000 MCG tablet    Sig: Take 1 tablet (1,000 mcg total) by mouth daily.     The patient was counseled, risk factors were discussed, anticipatory guidance given.  Gross side effects, risk and benefits, and alternatives of medications discussed with patient.  Patient is aware that all medications have potential side effects and we are unable to predict every side  effect or drug-drug interaction that may occur.  Expresses verbal understanding and consents to current therapy plan and treatment regimen.  Return for return in near future for FBW, then OV with me in 4-6 mo.  Please see AVS handed out to patient at the end of our visit for further patient instructions/ counseling done pertaining to today's office visit.    Note: This document was prepared using Dragon voice recognition software and may include unintentional dictation errors.  This document serves as a record of services personally performed by Mellody Dance, DO. It was created on her behalf by [name], a trained medical scribe. The  creation of this record is based on the scribe's personal observations and the provider's statements to them.   I have reviewed the above medical documentation for accuracy and completeness and I concur.  Mellody Dance 05/24/17 4:07 PM    Subjective:    HPI: George [name] is a 67 y.o. male who presents to [place] [facility] today for follow up for HTN.    Built one church in [place] during his recent trip, in 5 days.  Last visit with [place] was 11/11/2016.  Cardiology 03/12/2017 he went to see his cardiologist, Dr. Virl Axe, and notes that nothing's new - everything is stable so far. His heart block is stable, pacemaker is good.  Has complete heart block with a pacemaker placed. Echocardiogram was 01/31/2017 which showed EF of 50-55% which was mildly improved from prior study.  Vitamin B12 and Vitamin D Deficiencies He has been taking his Vitamin D supplements. He has not been taking B12 supplements.  HLD:  Notes that he has not been working on a low saturated fat/low trans fat diet. Has not been exercising adequately to control his cholesterol.  HTN:  Re-check in office: 130/74  -  His blood pressure has been controlled at home.  Notes that his blood pressure has been up and down. Usually runs around 130's 140's over the 50's 60's. Pulse runs at about 55, 54 per pacemaker.  - Patient reports good compliance with blood pressure medications  - Denies medication S-E  Is able to do all of his activities and home improvements as usual.   - Smoking Status noted   - He denies new onset of: chest pain, exercise intolerance, shortness of breath, dizziness, visual changes, headache, lower extremity swelling or claudication.   Last 3 blood pressure readings in our office are as follows: BP Readings from Last 3 Encounters:  05/20/17 (!) 155/89  05/13/17 130/74  01/17/17 (!) 142/78    Pulse Readings from Last 3 Encounters:  05/20/17 (!) 50    05/13/17 (!) 54  01/17/17 (!) 54    Filed Weights   05/13/17 1459  Weight: 166 lb 11.2 oz (75.6 kg)      Patient Care Team    Relationship Specialty Notifications Start End  Mellody Dance, DO PCP - General Family Medicine  09/14/15   Wonda Horner, MD Consulting Physician Gastroenterology  11/12/16   Allyn Kenner, MD Consulting Physician Dermatology  11/13/16   Pa, [place] Physician Optometry  11/13/16   [name], MD Consulting Physician Cardiology  05/13/17    Comment: PM placement and EP     Lab Results  Component Value Date   CREATININE 0.85 11/13/2016   BUN 9 11/13/2016   NA 138 11/13/2016   K 4.3 11/13/2016   CL 100 11/13/2016   CO2 24 11/13/2016  Lab Results  Component Value Date   CHOL 201 (H) 05/20/2017   CHOL 191 11/13/2016   CHOL 177 09/29/2015    Lab Results  Component Value Date   HDL 50 05/20/2017   HDL 41 11/13/2016   HDL 57 09/29/2015    Lab Results  Component Value Date   LDLCALC 127 (H) 05/20/2017   LDLCALC 124 (H) 11/13/2016   LDLCALC 110 09/29/2015    Lab Results  Component Value Date   TRIG 120 05/20/2017   TRIG 129 11/13/2016   TRIG 52 09/29/2015    Lab Results  Component Value Date   CHOLHDL 4.0 05/20/2017   CHOLHDL 4.7 11/13/2016   CHOLHDL 3.1 09/29/2015    No results found for: LDLDIRECT ===================================================================   Patient Active Problem List   Diagnosis Date Noted  . Hyperlipidemia LDL goal <100 05/13/2017    Priority: High  . Other male erectile dysfunction- "hx of fractured penis requiring sx" 05/20/2017  . Dizziness 08/21/2016  . BBBB (bilateral bundle branch block) 10/18/2015  . Complete heart block ([place]) 10/18/2015  . Vitamin B12 deficiency 10/06/2015  . Vitamin D insufficiency 10/06/2015  . Bradycardia 10/06/2015  . Hereditary hyperbilirubinemia 10/06/2015  . H/O noncompliance with medical treatment, presenting hazards to health  09/19/2015  . Peyronie's disease 09/19/2015  . DOE (dyspnea on exertion) 09/19/2015  . Unexplained weight loss 09/14/2015  . Essential hypertension 09/14/2015  . h/o Non-compliance: Has not seen a doc in over 13 years 09/14/2015  . Fatigue 09/14/2015     Past Medical History:  Diagnosis Date  . Arthritis THUMBS  . LBBB (left bundle branch block)   . Peyronie's disease      Past Surgical History:  Procedure Laterality Date  . CARDIOVASCULAR STRESS TEST  10-18-2002  DR  CRENSHAW   NORMAL ADENOSINE CARDIOLITE/ EF 60%  . CATARACT EXTRACTION W/ INTRAOCULAR LENS IMPLANT  2006   LEFT EYE  . EP IMPLANTABLE DEVICE N/A 10/18/2015   Procedure: BiV Pacemaker Insertion CRT-P;  Surgeon: [name], MD;  Location: [facility];  Service: Cardiovascular;  Laterality: N/A;  . LAPAROSCOPIC CHOLECYSTECTOMY  10-09-2007  . NESBIT PROCEDURE  01/17/2012   Procedure: NESBIT PROCEDURE;  Surgeon: [name], MD;  Location: [facility];  Service: Urology;  Laterality: N/A;  16 dot plication   . [place], LEFT EYE  1993  . REPAIR LEFT INGUINAL HERNIA  2005     Family History  Problem Relation Age of Onset  . Diabetes Mother   . Hypertension Mother   . Hyperlipidemia Mother   . Heart disease Father   . Hyperlipidemia Father   . Hypertension Father   . Arrhythmia Father        paf  . Healthy Daughter   . Healthy Son   . Heart disease Paternal Grandfather   . Heart attack Paternal Grandfather      Social History   Substance and Sexual Activity  Drug Use No  ,  Social History   Substance and Sexual Activity  Alcohol Use No  ,  Social History   Tobacco Use  Smoking Status Never Smoker  Smokeless Tobacco Never Used  ,    Current Outpatient Medications on File Prior to Visit  Medication Sig Dispense Refill  . carvedilol (COREG) 25 MG tablet TAKE 1 TABLET BY MOUTH TWICE A DAY WITH MEALS 180 tablet 2  . loratadine (CLARITIN) 10 MG tablet Take  10 mg by mouth daily as  needed.     . Magnesium 200 MG TABS Take 1 tablet by mouth daily.    . vitamin E 100 UNIT capsule Take 100 Units by mouth daily.     No current facility-administered medications on file prior to visit.     No Known Allergies   Review of Systems:   General:  Denies fever, chills Optho/Auditory:   Denies visual changes, blurred vision Respiratory:   Denies SOB, cough, wheeze, DIB  Cardiovascular:   Denies chest pain, palpitations, painful respirations Gastrointestinal:   Denies nausea, vomiting, diarrhea.  Endocrine:     Denies new hot or cold intolerance Musculoskeletal:  Denies joint swelling, gait issues, or new unexplained myalgias/ arthralgias Skin:  Denies rash, suspicious lesions  Neurological:    Denies dizziness, unexplained weakness, numbness  Psychiatric/Behavioral:   Denies mood changes  Objective:    Blood pressure 130/74, pulse (!) 54, height 5\' 8"  (1.727 m), weight 166 lb 11.2 oz (75.6 kg), SpO2 99 %.  Body mass index is 25.35 kg/m.  General: Well Developed, well nourished, and in no acute distress.  HEENT: Normocephalic, atraumatic, pupils equal round reactive to light, neck supple, No carotid bruits, no JVD Skin: Warm and dry, cap RF less 2 sec Cardiac: Regular rate and rhythm, S1, S2 WNL's, no murmurs rubs or gallops Respiratory: ECTA B/L, Not using accessory muscles, speaking in full sentences. NeuroM-Sk: Ambulates w/o assistance, moves ext * 4 w/o difficulty, sensation grossly intact.  Ext: scant edema b/l lower ext Psych: No HI/SI, judgement and insight good, Euthymic mood. Full Affect.

## 2017-05-13 NOTE — Patient Instructions (Addendum)
Patient will come in in the near future at his convenience for fasting blood work.  Nothing eat or drink past 8 PM.  Orders are already in the chart for the 3 labs he needs done.  George [name] agrees that if his cholesterol is not significantly improved with an LDL less than 100 and his 10-year risk within an acceptable range, he agrees to go on Lipitor or another statin.      - please understand if we start you on a new cholesterol statin medication that you will need blood work in 6 weeks to make sure your liver enzymes are okay  It is extremely important if you go on this cholesterol medicine that you drink one half of your weight in ounces of water per day.  (200 pound person drinks 100 ounces of water per day.  If you are out in the heat working then for every 30 minutes of sweating or working hard you need an extra 1 bottle of water.    Guidelines for a Low Cholesterol, Low Saturated Fat Diet  Fats - Limit total intake of fats and oils. - Avoid butter, stick margarine, shortening, lard, palm and coconut oils. - Limit mayonnaise, salad dressings, gravies and sauces, unless they are homemade with low-fat ingredients. - Limit chocolate. - Choose low-fat and nonfat products, such as low-fat mayonnaise, low-fat or non-hydrogenated peanut butter, low-fat or fat-free salad dressings and nonfat gravy. - Use vegetable oil, such as canola or olive oil. - Look for margarine that does not contain trans fatty acids. - Use nuts in moderate amounts. - Read ingredient labels carefully to determine both amount and type of fat present in foods. Limit saturated and trans fats! - Avoid high-fat processed and convenience foods.  Meats and Meat Alternatives - Choose fish, chicken, [place] and lean meats. - Use dried beans, peas, lentils and tofu. - Limit egg yolks to three to four per week. - If you eat red meat, limit to no more than three servings per week and choose loin or round cuts. - Avoid fatty meats,  such as bacon, sausage, franks, luncheon meats and ribs. - Avoid all organ meats, including liver.  Dairy - Choose nonfat or low-fat milk, yogurt and cottage cheese. - Most cheeses are high in fat. Choose cheeses made from non-fat milk, such as mozzarella and ricotta cheese. - Choose light or fat-free cream cheese and sour cream. - Avoid cream and sauces made with cream.  Fruits and Vegetables - Eat a wide variety of fruits and vegetables. - Use lemon juice, vinegar or "mist" olive oil on vegetables. - Avoid adding sauces, fat or oil to vegetables.  Breads, Cereals and Grains - Choose whole-grain breads, cereals, pastas and rice. - Avoid high-fat snack foods, such as granola, cookies, pies, pastries, doughnuts and croissants.  Cooking Tips - Avoid deep fried foods. - Trim visible fat off meats and remove skin from poultry before cooking. - Bake, broil, boil, poach or roast poultry, fish and lean meats. - Drain and discard fat that drains out of meat as you cook it. - Add little or no fat to foods. - Use vegetable oil sprays to grease pans for cooking or baking. - Steam vegetables. - Use herbs or no-oil marinades to flavor foods.

## 2017-05-20 ENCOUNTER — Other Ambulatory Visit (INDEPENDENT_AMBULATORY_CARE_PROVIDER_SITE_OTHER): Payer: BLUE CROSS/BLUE SHIELD

## 2017-05-20 ENCOUNTER — Ambulatory Visit (INDEPENDENT_AMBULATORY_CARE_PROVIDER_SITE_OTHER): Payer: BLUE CROSS/BLUE SHIELD | Admitting: Family Medicine

## 2017-05-20 ENCOUNTER — Telehealth: Payer: Self-pay | Admitting: Family Medicine

## 2017-05-20 ENCOUNTER — Encounter: Payer: Self-pay | Admitting: Family Medicine

## 2017-05-20 VITALS — BP 155/89 | HR 50 | Ht 68.0 in | Wt 165.0 lb

## 2017-05-20 DIAGNOSIS — E785 Hyperlipidemia, unspecified: Secondary | ICD-10-CM

## 2017-05-20 DIAGNOSIS — N528 Other male erectile dysfunction: Secondary | ICD-10-CM

## 2017-05-20 DIAGNOSIS — N486 Induration penis plastica: Secondary | ICD-10-CM | POA: Diagnosis not present

## 2017-05-20 DIAGNOSIS — E538 Deficiency of other specified B group vitamins: Secondary | ICD-10-CM

## 2017-05-20 DIAGNOSIS — E559 Vitamin D deficiency, unspecified: Secondary | ICD-10-CM

## 2017-05-20 NOTE — Telephone Encounter (Signed)
Noted MPulliam, CMA/RT(R)  

## 2017-05-20 NOTE — Progress Notes (Signed)
Pt here for an acute care OV today   Impression and Recommendations:    1. Other male erectile dysfunction- "hx of fractured penis requiring sx"   2. Peyronie's disease      No orders of the defined types were placed in this encounter.   No orders of the defined types were placed in this encounter.   1. Erection Concerns - Per patient, broke his penis in the past, and had surgery on it.  Was operated on at [facility].   - Given this history, recommended that the patient visit urology to have the physiology of his penis examined, to assess his vasculature and make sure everything is working properly from a neurological and vascular standpoint before other treatment is recommended.  Urology will decide whether or not further treatment is recommended.  - Discussed potential future options with the patient, such as medication, penile injections, penile pumps, and other tools for stimulation.  -  In general, exercise to increase blood flow is recommended.  Reviewed that cardiovascular exercise improves heart health and vascular health, which will increase the blood flow throughout his body, and improve the ability of his genital vasculature to hold blood.   Education and routine counseling performed. Handouts provided  Gross side effects, risk and benefits, and alternatives of medications and treatment plan in general discussed with patient.  Patient is aware that all medications have potential side effects and we are unable to predict every side effect or drug-drug interaction that may occur.   Patient will call with any questions prior to using medication if they have concerns.  Expresses verbal understanding and consents to current therapy and treatment regimen.  No barriers to understanding were identified.  Red flag symptoms and signs discussed in detail.  Patient expressed understanding regarding what to do in case of emergency\urgent symptoms   Please see AVS handed out  to patient at the end of our visit for further patient instructions/ counseling done pertaining to today's office visit.   Return for f/up q 4 -6 mo for chronic dx mgt ; needs yrly PE as well in addition.     Note: This document was prepared occasionally using Dragon voice recognition software and may include unintentional dictation errors in addition to a scribe.  This document serves as a record of services personally performed by Mellody Dance, DO. It was created on her behalf by [name], a trained medical scribe. The creation of this record is based on the scribe's personal observations and the provider's statements to them.   I have reviewed the above medical documentation for accuracy and completeness and I concur.  Mellody Dance 05/20/17 12:18 PM   -------------------------------------------------------------------------------------------------------------------------------------------------------------------------------------    Subjective:    CC:  Chief Complaint  Patient presents with  . Erectile Dysfunction    HPI: George [name] is a 67 y.o. male who presents to [place] at [facility] today for issues as discussed below.  Patient has concerns today about keeping his erection.  Toward end of the appointment, patient notes that he broke his penis in the past, and had to have surgery on it.  Per patient, was operated on through [facility].  Notes that the erection issues started happening about 2 years after this.  Notes that there has been a bend in his penis ever since this incident.  Otherwise notes that concerns about his erection have been happening for "a while."  He says he is able to start  out with an erection, but it doesn't last as long as it used to.  Patient notes that his average duration is 20 minutes, but his wife contradicts this.  She notes that his average duration is more like 5 minutes.  Patient confirms that he  is able to initially get the arousal, but does not stay erect.  Denies numbness, tingling, or any issues with sensation.  Notes no pain with erections, and confirms that his ejaculation is normal.  Has absolutely no issues with desire - often feels desire.  Wife notes that he feels the desire to have sex every day.  He has not taken any medicine such as Viagra or Cialis for this in the past.  He has never tried a tool such as a pump.   Problem  Hyperlipidemia Ldl Goal <100  Other male erectile dysfunction- "hx of fractured penis requiring sx"  Peyronie's Disease   Penile curvature secondary to Peyronie's disease- corrective sx in 2013      Wt Readings from Last 3 Encounters:  05/20/17 165 lb (74.8 kg)  05/13/17 166 lb 11.2 oz (75.6 kg)  01/17/17 165 lb (74.8 kg)   BP Readings from Last 3 Encounters:  05/20/17 (!) 155/89  05/13/17 130/74  01/17/17 (!) 142/78   BMI Readings from Last 3 Encounters:  05/20/17 25.09 kg/m  05/13/17 25.35 kg/m  01/17/17 25.09 kg/m     Patient Care Team    Relationship Specialty Notifications Start End  Mellody Dance, DO PCP - General Family Medicine  09/14/15   Wonda Horner, MD Consulting Physician Gastroenterology  11/12/16   Allyn Kenner, MD Consulting Physician Dermatology  11/13/16   Pa, [place] Physician Optometry  11/13/16   [name], MD Consulting Physician Cardiology  05/13/17    Comment: PM placement and EP     Patient Active Problem List   Diagnosis Date Noted  . Hyperlipidemia LDL goal <100 05/13/2017    Priority: High  . Other male erectile dysfunction- "hx of fractured penis requiring sx" 05/20/2017  . Dizziness 08/21/2016  . BBBB (bilateral bundle branch block) 10/18/2015  . Complete heart block ([place]) 10/18/2015  . Vitamin B12 deficiency 10/06/2015  . Vitamin D insufficiency 10/06/2015  . Bradycardia 10/06/2015  . Hereditary hyperbilirubinemia 10/06/2015  . H/O noncompliance with medical  treatment, presenting hazards to health 09/19/2015  . Peyronie's disease 09/19/2015  . DOE (dyspnea on exertion) 09/19/2015  . Unexplained weight loss 09/14/2015  . Essential hypertension 09/14/2015  . h/o Non-compliance: Has not seen a doc in over 13 years 09/14/2015  . Fatigue 09/14/2015    Past Medical history, Surgical history, Family history, Social history, Allergies and Medications have been entered into the medical record, reviewed and changed as needed.    Current Meds  Medication Sig  . carvedilol (COREG) 25 MG tablet TAKE 1 TABLET BY MOUTH TWICE A DAY WITH MEALS  . Cholecalciferol (VITAMIN D3) 5000 units TABS 5,000 IU OTC vitamin D3 daily.  [place] Kitchen loratadine (CLARITIN) 10 MG tablet Take 10 mg by mouth daily as needed.   . Magnesium 200 MG TABS Take 1 tablet by mouth daily.  . vitamin B-12 (CYANOCOBALAMIN) 1000 MCG tablet Take 1 tablet (1,000 mcg total) by mouth daily.  . vitamin E 100 UNIT capsule Take 100 Units by mouth daily.    Allergies:  No Known Allergies   Review of Systems: General:   Denies fever, chills, unexplained weight loss.  Optho/Auditory:   Denies visual changes, blurred  vision/LOV Respiratory:   Denies wheeze, DOE more than baseline levels.  Cardiovascular:   Denies chest pain, palpitations, new onset peripheral edema  Gastrointestinal:   Denies nausea, vomiting, diarrhea, abd pain.  Genitourinary: Denies dysuria, freq/ urgency, flank pain or discharge from genitals.  Endocrine:     Denies hot or cold intolerance, polyuria, polydipsia. Musculoskeletal:   Denies unexplained myalgias, joint swelling, unexplained arthralgias, gait problems.  Skin:  Denies new onset rash, suspicious lesions Neurological:     Denies dizziness, unexplained weakness, numbness  Psychiatric/Behavioral:   Denies mood changes, suicidal or homicidal ideations, hallucinations    Objective:   Blood pressure (!) 155/89, pulse (!) 50, height 5\' 8"  (1.727 m), weight 165 lb (74.8  kg). Body mass index is 25.09 kg/m. General:  Well Developed, well nourished, appropriate for stated age.  Neuro:  Alert and oriented,  extra-ocular muscles intact  HEENT:  Normocephalic, atraumatic, neck supple Skin:  no gross rash, warm, pink. Cardiac:  RRR, S1 S2 Respiratory:  ECTA B/L and A/P, Not using accessory muscles, speaking in full sentences- unlabored. Vascular:  Ext warm, no cyanosis apprec.; cap RF less 2 sec. Psych:  No HI/SI, judgement and insight good, Euthymic mood. Full Affect.

## 2017-05-20 NOTE — Patient Instructions (Signed)
Please let me know if you have trouble getting in with [facility] and or if you need a referral.

## 2017-05-20 NOTE — Telephone Encounter (Signed)
Patient is sch at [facility] today at 1245p

## 2017-05-21 LAB — LIPID PANEL
CHOL/HDL RATIO: 4 ratio (ref 0.0–5.0)
CHOLESTEROL TOTAL: 201 mg/dL — AB (ref 100–199)
HDL: 50 mg/dL (ref >39)
LDL CALC: 127 mg/dL — AB (ref 0–99)
TRIGLYCERIDES: 120 mg/dL (ref 0–149)
VLDL CHOLESTEROL CAL: 24 mg/dL (ref 5–40)

## 2017-05-21 LAB — VITAMIN D 25 HYDROXY (VIT D DEFICIENCY, FRACTURES): Vit D, 25-Hydroxy: 50.9 ng/mL (ref 30.0–100.0)

## 2017-05-21 LAB — VITAMIN B12: Vitamin B-12: <150 pg/mL — ABNORMAL LOW (ref 232–1245)

## 2017-06-05 ENCOUNTER — Encounter: Payer: Self-pay | Admitting: Family Medicine

## 2017-06-05 ENCOUNTER — Ambulatory Visit (INDEPENDENT_AMBULATORY_CARE_PROVIDER_SITE_OTHER): Payer: BLUE CROSS/BLUE SHIELD | Admitting: Family Medicine

## 2017-06-05 VITALS — BP 144/80 | HR 64 | Ht 68.0 in | Wt 165.8 lb

## 2017-06-05 DIAGNOSIS — Z91199 Patient's noncompliance with other medical treatment and regimen due to unspecified reason: Secondary | ICD-10-CM

## 2017-06-05 DIAGNOSIS — Z9119 Patient's noncompliance with other medical treatment and regimen: Secondary | ICD-10-CM

## 2017-06-05 DIAGNOSIS — I10 Essential (primary) hypertension: Secondary | ICD-10-CM | POA: Diagnosis not present

## 2017-06-05 DIAGNOSIS — E559 Vitamin D deficiency, unspecified: Secondary | ICD-10-CM

## 2017-06-05 DIAGNOSIS — Z79899 Other long term (current) drug therapy: Secondary | ICD-10-CM

## 2017-06-05 DIAGNOSIS — E538 Deficiency of other specified B group vitamins: Secondary | ICD-10-CM

## 2017-06-05 DIAGNOSIS — E785 Hyperlipidemia, unspecified: Secondary | ICD-10-CM | POA: Diagnosis not present

## 2017-06-05 MED ORDER — VITAMIN B-12 1000 MCG PO TABS: 1000.0000 ug | ORAL_TABLET | Freq: Two times a day (BID) | ORAL | Status: DC

## 2017-06-05 MED ORDER — ATORVASTATIN CALCIUM 40 MG PO TABS: 40.0000 mg | ORAL_TABLET | Freq: Every day | ORAL | 3 refills | Status: DC

## 2017-06-05 NOTE — Progress Notes (Signed)
Assessment and plan:  1. Hyperlipidemia LDL goal <100   2. Essential hypertension   3. Vitamin D insufficiency   4. B12 deficiency   5. Vitamin B12 deficiency   6. H/O noncompliance with medical treatment, presenting hazards to health   7. High risk medication use    Here to review lab work drawn on 05/20/2017.  1. Hyperlipidemia - Recommended starting cholesterol meds at this time. - Reviewed and recommended atorvastatin/Lipitor 40. - Patient should take this medication before bedtime.  - Noted that all statins have the risk of associated body aches.  Emphasized the importance of drinking adequate water and exercising to mitigate and minimize the risk of statin-related body aches.  - Reviewed the ASCVD risk in detail with the patient. Noted that his risk should be less than 7.5%.  The 10-year ASCVD risk score George [name]., et al., 2013) is: 16.7%   Values used to calculate the score:     Age: 65 years     Sex: Male     Is Non-Hispanic African American: No     Diabetic: No     Tobacco smoker: No     Systolic Blood Pressure: 572 mmHg     Is BP treated: Yes     HDL Cholesterol: 50 mg/dL     Total Cholesterol: 201 mg/dL  Total cholesterol = 201 LDL = 127 HDL = 50; is up from 6 months ago, when it was only 41. Triglycerides came down to 120; were previously 129. - Goal of LDL per cardiology is <100.  - Reviewed the importance of eating less foods like biscuits, sausage, which are full of saturated & trans fats. - Patient advised to decrease consumption of saturated and trans fats, and increase exercise. - Information provided on guidelines for low cholesterol, low trans-fat diet.  - 6 weeks from now, re-check liver enzymes to make sure liver is not aggravated.  2. Vitamin B12 - B12 is <150. - Patient has not begun taking oral B12.  - Advised patient that B12 deficiency can cause anemia, numbness, tingling.   Advised patient that B12 is indicated in healthy brain function.  - Patient will begin taking B12 twice daily. - Will begin taking multivitamin with folic acid to help absorb the B12 - men's Centrum Silver.  - In 4 months, check and make sure B12 is being absorbed.  - If his B12 level has not increased, he may need to go on injections.  3. Vitamin D Insufficiency  - Continue to take Vitamin D as prescribed. - Will continue to monitor.  4. Hypertension - Continue to take Coreg as prescribed. - Patient knows to try to remember to take it every night.  5. General Health Maintenance - Advised patient to continue working toward exercising to improve health.    - Recommended that the patient eventually strive for at least 150 minutes of cardio per week according to guidelines established by the [facility].   - Healthy dietary habits encouraged, including low-carb, and high amounts of lean protein in diet.   - Patient should also consume adequate amounts of water - half of body weight in oz of water per day.  Erectile Dysfunction - Patient recently visited urology with his ED concerns. - Was prescribed Sildenafil and is tolerating Sildenafil well.   - Advised patient that he may continue to refill this prescription through the office here.  6. Follow-Up - 6 weeks, lab only appointment to check full CMP,  especially to evaluate ALT/liver enzymes with medication use.  - Otherwise, follow up OV in 4 months.  - In 4 months, re-check B12, full CMP, and lipid panel after starting Lipitor, to evaluate progress on changes.   Education and routine counseling performed. Handouts provided.  Orders Placed This Encounter  Procedures  . Comprehensive metabolic panel  . Lipid panel  . B12 and Folate Panel    Meds ordered this encounter  Medications  . vitamin B-12 (CYANOCOBALAMIN) 1000 MCG tablet    Sig: Take 1 tablet (1,000 mcg total) by mouth 2 (two) times daily.  Marland Kitchen atorvastatin (LIPITOR) 40  MG tablet    Sig: Take 1 tablet (40 mg total) by mouth at bedtime.    Dispense:  90 tablet    Refill:  3     Return for 6 wks- lab only;  42mo f/up Dr Jenetta Downer.   Anticipatory guidance and routine counseling done re: condition, txmnt options and need for follow up. All questions of patient's were answered.   Gross side effects, risk and benefits, and alternatives of medications discussed with patient.  Patient is aware that all medications have potential side effects and we are unable to predict every sideeffect or drug-drug interaction that may occur.  Expresses verbal understanding and consents to current therapy plan and treatment regiment.  Please see AVS handed out to patient at the end of our visit for additional patient instructions/ counseling done pertaining to today's office visit.  Note: This document was prepared using Dragon voice recognition software and may include unintentional dictation errors.    This document serves as a record of services personally performed by Mellody Dance, DO. It was created on her behalf by [name], a trained medical scribe. The creation of this record is based on the scribe's personal observations and the provider's statements to them.   I have reviewed the above medical documentation for accuracy and completeness and I concur.  Mellody Dance 06/05/17 3:55 PM   ----------------------------------------------------------------------------------------------------------------------  Subjective:   CC:   George [name] is a 67 y.o. male who presents to [facility] today for review and discussion of recent bloodwork that was done.  1. All recent blood work that we ordered was reviewed with patient today.  Patient was counseled on all abnormalities and we discussed dietary and lifestyle changes that could help those values (also medications when appropriate).  Extensive health counseling performed and all  patient's concerns/ questions were addressed.    Here to review recent lab work done on 05/20/2017.  Denies feeling as fatigued as before.  Patient has not started taking his oral B12.  Has been taking Vitamin D and Magnesium, and all other medications as prescribed.  He does not currently take a men's multivitamin, and does not often take antacids.  Sometimes the patient does forget to take his Coreg pill at night, especially after going out to eat dinner.  Notes that he is not currently drinking enough water.   Erectile Dysfunction Went home after last appointment and called the urologist - got an appointment. Did not feel he needed any w/up   Is on a 20 mg Viagra pill (Sildenafil); was told he shouldn't have any problems with the medication.  Notes that after he took the pills, he felt like the pressure in his ears was changing, but that's the only side effect he noticed.    Wt Readings from Last 3 Encounters:  06/05/17 165 lb 12.8 oz (  75.2 kg)  05/20/17 165 lb (74.8 kg)  05/13/17 166 lb 11.2 oz (75.6 kg)   BP Readings from Last 3 Encounters:  06/05/17 (!) 144/80  05/20/17 (!) 155/89  05/13/17 130/74   Pulse Readings from Last 3 Encounters:  06/05/17 64  05/20/17 (!) 50  05/13/17 (!) 54   BMI Readings from Last 3 Encounters:  06/05/17 25.21 kg/m  05/20/17 25.09 kg/m  05/13/17 25.35 kg/m     Patient Care Team    Relationship Specialty Notifications Start End  Mellody Dance, DO PCP - General Family Medicine  09/14/15   Wonda Horner, MD Consulting Physician Gastroenterology  11/12/16   Allyn Kenner, MD Consulting Physician Dermatology  11/13/16   Pa, Lucas Physician Optometry  11/13/16   [name], MD Consulting Physician Cardiology  05/13/17    Comment: PM placement and EP    Full medical history updated and reviewed in the office today  Patient Active Problem List   Diagnosis Date Noted  . Hyperlipidemia LDL goal <100 05/13/2017     Priority: High  . Other male erectile dysfunction- "hx of fractured penis requiring sx" 05/20/2017  . Dizziness 08/21/2016  . BBBB (bilateral bundle branch block) 10/18/2015  . Complete heart block ([place]) 10/18/2015  . Vitamin B12 deficiency 10/06/2015  . Vitamin D insufficiency 10/06/2015  . Bradycardia 10/06/2015  . Hereditary hyperbilirubinemia 10/06/2015  . H/O noncompliance with medical treatment, presenting hazards to health 09/19/2015  . Peyronie's disease 09/19/2015  . DOE (dyspnea on exertion) 09/19/2015  . Unexplained weight loss 09/14/2015  . Essential hypertension 09/14/2015  . h/o Non-compliance: Has not seen a doc in over 13 years 09/14/2015  . Fatigue 09/14/2015    Past Medical History:  Diagnosis Date  . Arthritis THUMBS  . LBBB (left bundle branch block)   . Peyronie's disease     Past Surgical History:  Procedure Laterality Date  . CARDIOVASCULAR STRESS TEST  10-18-2002  DR  CRENSHAW   NORMAL ADENOSINE CARDIOLITE/ EF 60%  . CATARACT EXTRACTION W/ INTRAOCULAR LENS IMPLANT  2006   LEFT EYE  . EP IMPLANTABLE DEVICE N/A 10/18/2015   Procedure: BiV Pacemaker Insertion CRT-P;  Surgeon: [name], MD;  Location: [place];  Service: Cardiovascular;  Laterality: N/A;  . LAPAROSCOPIC CHOLECYSTECTOMY  10-09-2007  . NESBIT PROCEDURE  01/17/2012   Procedure: NESBIT PROCEDURE;  Surgeon: [name], MD;  Location: [facility];  Service: Urology;  Laterality: N/A;  16 dot plication   . [place], LEFT EYE  1993  . REPAIR LEFT INGUINAL HERNIA  2005    Social History   Tobacco Use  . Smoking status: Never Smoker  . Smokeless tobacco: Never Used  Substance Use Topics  . Alcohol use: No    Family Hx: Family History  Problem Relation Age of Onset  . Diabetes Mother   . Hypertension Mother   . Hyperlipidemia Mother   . Heart disease Father   . Hyperlipidemia Father   . Hypertension Father   . Arrhythmia Father         paf  . Healthy Daughter   . Healthy Son   . Heart disease Paternal Grandfather   . Heart attack Paternal Grandfather      Medications: Current Outpatient Medications  Medication Sig Dispense Refill  . carvedilol (COREG) 25 MG tablet TAKE 1 TABLET BY MOUTH TWICE A DAY WITH MEALS 180 tablet 2  . Cholecalciferol (VITAMIN D3) 5000  units TABS 5,000 IU OTC vitamin D3 daily. 90 tablet 3  . loratadine (CLARITIN) 10 MG tablet Take 10 mg by mouth daily as needed.     . Magnesium 200 MG TABS Take 1 tablet by mouth daily.    . vitamin B-12 (CYANOCOBALAMIN) 1000 MCG tablet Take 1 tablet (1,000 mcg total) by mouth 2 (two) times daily.    . vitamin E 100 UNIT capsule Take 100 Units by mouth daily.    Marland Kitchen atorvastatin (LIPITOR) 40 MG tablet Take 1 tablet (40 mg total) by mouth at bedtime. 90 tablet 3   No current facility-administered medications for this visit.     Allergies:  No Known Allergies   Review of Systems: General:   No F/C, wt loss Pulm:   No DIB, SOB, pleuritic chest pain Card:  No CP, palpitations Abd:  No n/v/d or pain Ext:  No inc edema from baseline  Objective:  Blood pressure (!) 144/80, pulse 64, height 5\' 8"  (1.727 m), weight 165 lb 12.8 oz (75.2 kg), SpO2 98 %. Body mass index is 25.21 kg/m. Gen:   Well NAD, A and O *3 HEENT:    [place]/AT, EOMI,  MMM Lungs:   Normal work of breathing. CTA B/L, no Wh, rhonchi Heart:   RRR, S1, S2 WNL's, no MRG Abd:   No gross distention Exts:    warm, pink,  Brisk capillary refill, warm and well perfused.  Psych:    No HI/SI, judgement and insight good, Euthymic mood. Full Affect.   Recent Results (from the past 2160 hour(s))  CUP PACEART REMOTE DEVICE CHECK     Status: None   Collection Time: 03/12/17  7:46 PM  Result Value Ref Range   Date Time Interrogation Session [id number]    Pulse Generator Manufacturer MERM    Pulse Gen Model [id number] Percepta Quad CRT-P    Pulse Gen Serial Number [id number]    Clinic Name [facility]     Implantable Pulse Generator Type Cardiac Resynch Therapy Pacemaker    Implantable Pulse Generator Implant Date [id number]    Implantable Lead Manufacturer MERM    Implantable Lead Model 5076 CapSureFix Novus MRI SureScan    Implantable Lead Serial Number [id number]    Implantable Lead Implant Date 14481856    Implantable Lead Location Detail 1 APPENDAGE    Implantable Lead Location [id number]    Implantable Lead Manufacturer MERM    Implantable Lead Model 5076 CapSureFix Novus MRI SureScan    Implantable Lead Serial Number [id number]    Implantable Lead Implant Date [id number]    Implantable Lead Location Detail 1 APEX    Implantable Lead Location [id number]    Implantable Lead Manufacturer [facility]    Implantable Lead Model 1458Q Quartet    Implantable Lead Serial Number [id number]    Implantable Lead Implant Date 37858850    Implantable Lead Location Detail 1 UNKNOWN    Implantable Lead Location [id number]    Lead Channel Setting Sensing Sensitivity 2.8 mV   Lead Channel Setting Pacing Amplitude 1.75 V   Lead Channel Setting Pacing Pulse Width 0.4 ms   Lead Channel Setting Pacing Amplitude 2 V   Lead Channel Setting Pacing Pulse Width 0.8 ms   Lead Channel Setting Pacing Amplitude 2.5 V   Lead Channel Setting Pacing Capture Mode Adaptive Capture    Lead Channel Impedance Value 570 ohm   Lead Channel Impedance Value 323 ohm   Lead Channel Sensing Intrinsic Amplitude 2.125 mV   Lead Consulting civil engineer  Amplitude 2.125 mV   Lead Channel Pacing Threshold Amplitude 0.75 V   Lead Channel Pacing Threshold Pulse Width 0.4 ms   Lead Channel Impedance Value 437 ohm   Lead Channel Impedance Value 361 ohm   Lead Channel Sensing Intrinsic Amplitude 16.5 mV   Lead Channel Sensing Intrinsic Amplitude 16.5 mV   Lead Channel Pacing Threshold Amplitude 1 V   Lead Channel Pacing Threshold Pulse Width 0.4 ms   Lead Channel Impedance Value 494 ohm   Lead Channel Impedance Value 513 ohm   Lead Channel  Impedance Value 589 ohm   Lead Channel Impedance Value 817 ohm   Lead Channel Impedance Value 874 ohm   Lead Channel Impedance Value 969 ohm   Lead Channel Impedance Value 1,197 ohm   Lead Channel Impedance Value 912 ohm   Lead Channel Impedance Value 1,197 ohm   Lead Channel Impedance Value 1,235 ohm   Lead Channel Pacing Threshold Amplitude 2 V   Lead Channel Pacing Threshold Pulse Width 0.8 ms   Battery Status OK    Battery Remaining Longevity 119 mo   Battery Voltage 3.02 V   Brady Statistic RA Percent Paced 17.02 %   Brady Statistic RV Percent Paced 22.48 %   Brady Statistic AP VP Percent 16.51 %   Brady Statistic AS VP Percent 79.78 %   Brady Statistic AP VS Percent 0.36 %   Brady Statistic AS VS Percent 3.35 %   Eval Rhythm ApVp, AsVp w/PVC   Vitamin B12     Status: Abnormal   Collection Time: 05/20/17  9:08 AM  Result Value Ref Range   Vitamin B-12 <150 (L) 232 - 1245 pg/mL  VITAMIN D 25 Hydroxy (Vit-D Deficiency, Fractures)     Status: None   Collection Time: 05/20/17  9:08 AM  Result Value Ref Range   Vit D, 25-Hydroxy 50.9 30.0 - 100.0 ng/mL    Comment: Vitamin D deficiency has been defined by the Institute of Medicine and an Endocrine Society practice guideline as a level of serum 25-OH vitamin D less than 20 ng/mL (1,2). The Endocrine Society went on to further define vitamin D insufficiency as a level between 21 and 29 ng/mL (2). 1. IOM (Institute of Medicine). 2010. Dietary reference    intakes for calcium and D. [place]: The    [employer]. 2. Holick MF, Binkley Klemme, Bischoff-Ferrari HA, et al.    Evaluation, treatment, and prevention of vitamin D    deficiency: an Endocrine Society clinical practice    guideline. JCEM. 2011 Jul; 96(7):1911-30.   Lipid panel     Status: Abnormal   Collection Time: 05/20/17  9:08 AM  Result Value Ref Range   Cholesterol, Total 201 (H) 100 - 199 mg/dL   Triglycerides 120 0 - 149 mg/dL   HDL 50 >39 mg/dL    VLDL Cholesterol Cal 24 5 - 40 mg/dL   LDL Calculated 127 (H) 0 - 99 mg/dL   Chol/HDL Ratio 4.0 0.0 - 5.0 ratio    Comment:                                   T. Chol/HDL Ratio  Men  Women                               1/2 Avg.Risk  3.4    3.3                                   Avg.Risk  5.0    4.4                                2X Avg.Risk  9.6    7.1                                3X Avg.Risk 23.4   11.0

## 2017-06-05 NOTE — Patient Instructions (Addendum)
Please take your B12 daily along with a men's Centrum silver vitamin which will have folic acid in.  In 3 months we will check your B12 level.  If it is not increased, you may need to go on injections.   We will start you on Lipitor today.  In 6 weeks please make a lab only appointment for recheck of your ALT liver enzyme.  We will recheck your cholesterol in 4 months after restart the medicine    Guidelines for a Low Cholesterol, Low Saturated Fat Diet  Fats - Limit total intake of fats and oils. - Avoid butter, stick margarine, shortening, lard, palm and coconut oils. - Limit mayonnaise, salad dressings, gravies and sauces, unless they are homemade with low-fat ingredients. - Limit chocolate. - Choose low-fat and nonfat products, such as low-fat mayonnaise, low-fat or non-hydrogenated peanut butter, low-fat or fat-free salad dressings and nonfat gravy. - Use vegetable oil, such as canola or olive oil. - Look for margarine that does not contain trans fatty acids. - Use nuts in moderate amounts. - Read ingredient labels carefully to determine both amount and type of fat present in foods. Limit saturated and trans fats! - Avoid high-fat processed and convenience foods.  Meats and Meat Alternatives - Choose fish, chicken, [place] and lean meats. - Use dried beans, peas, lentils and tofu. - Limit egg yolks to three to four per week. - If you eat red meat, limit to no more than three servings per week and choose loin or round cuts. - Avoid fatty meats, such as bacon, sausage, franks, luncheon meats and ribs. - Avoid all organ meats, including liver.  Dairy - Choose nonfat or low-fat milk, yogurt and cottage cheese. - Most cheeses are high in fat. Choose cheeses made from non-fat milk, such as mozzarella and ricotta cheese. - Choose light or fat-free cream cheese and sour cream. - Avoid cream and sauces made with cream.  Fruits and Vegetables - Eat a wide variety of fruits and  vegetables. - Use lemon juice, vinegar or "mist" olive oil on vegetables. - Avoid adding sauces, fat or oil to vegetables.  Breads, Cereals and Grains - Choose whole-grain breads, cereals, pastas and rice. - Avoid high-fat snack foods, such as granola, cookies, pies, pastries, doughnuts and croissants.  Cooking Tips - Avoid deep fried foods. - Trim visible fat off meats and remove skin from poultry before cooking. - Bake, broil, boil, poach or roast poultry, fish and lean meats. - Drain and discard fat that drains out of meat as you cook it. - Add little or no fat to foods. - Use vegetable oil sprays to grease pans for cooking or baking. - Steam vegetables. - Use herbs or no-oil marinades to flavor foods.

## 2017-06-11 ENCOUNTER — Ambulatory Visit (INDEPENDENT_AMBULATORY_CARE_PROVIDER_SITE_OTHER): Payer: BLUE CROSS/BLUE SHIELD | Admitting: *Deleted

## 2017-06-11 DIAGNOSIS — I442 Atrioventricular block, complete: Secondary | ICD-10-CM

## 2017-06-11 NOTE — Progress Notes (Signed)
Remote pacemaker transmission.   

## 2017-06-13 ENCOUNTER — Encounter: Payer: Self-pay | Admitting: Cardiology

## 2017-06-13 LAB — CUP PACEART REMOTE DEVICE CHECK
Battery Remaining Longevity: 100 mo
Brady Statistic AP VP Percent: 11.39 %
Brady Statistic AP VS Percent: 0.27 %
Brady Statistic AS VS Percent: 2.23 %
Brady Statistic RV Percent Paced: 28.31 %
Implantable Lead Implant Date: 20170802
Implantable Lead Location: 753858
Implantable Lead Model: 5076
Lead Channel Impedance Value: 1064 Ohm
Lead Channel Impedance Value: 1102 Ohm
Lead Channel Impedance Value: 1159 Ohm
Lead Channel Impedance Value: 342 Ohm
Lead Channel Impedance Value: 494 Ohm
Lead Channel Impedance Value: 570 Ohm
Lead Channel Impedance Value: 722 Ohm
Lead Channel Pacing Threshold Amplitude: 0.875 V
Lead Channel Pacing Threshold Amplitude: 2.125 V
Lead Channel Pacing Threshold Pulse Width: 0.4 ms
Lead Channel Pacing Threshold Pulse Width: 0.4 ms
Lead Channel Sensing Intrinsic Amplitude: 1.875 mV
Lead Channel Sensing Intrinsic Amplitude: 20.75 mV
Lead Channel Setting Pacing Amplitude: 1.75 V
Lead Channel Setting Pacing Pulse Width: 0.4 ms
MDC IDC LEAD IMPLANT DT: 20170802
MDC IDC LEAD IMPLANT DT: 20170802
MDC IDC LEAD LOCATION: 753859
MDC IDC LEAD LOCATION: 753860
MDC IDC MSMT BATTERY VOLTAGE: 3.01 V
MDC IDC MSMT LEADCHNL LV IMPEDANCE VALUE: 475 Ohm
MDC IDC MSMT LEADCHNL LV IMPEDANCE VALUE: 836 Ohm
MDC IDC MSMT LEADCHNL LV IMPEDANCE VALUE: 874 Ohm
MDC IDC MSMT LEADCHNL LV IMPEDANCE VALUE: 950 Ohm
MDC IDC MSMT LEADCHNL LV PACING THRESHOLD PULSEWIDTH: 0.8 ms
MDC IDC MSMT LEADCHNL RA IMPEDANCE VALUE: 589 Ohm
MDC IDC MSMT LEADCHNL RA SENSING INTR AMPL: 1.875 mV
MDC IDC MSMT LEADCHNL RV IMPEDANCE VALUE: 494 Ohm
MDC IDC MSMT LEADCHNL RV IMPEDANCE VALUE: 570 Ohm
MDC IDC MSMT LEADCHNL RV PACING THRESHOLD AMPLITUDE: 1 V
MDC IDC MSMT LEADCHNL RV SENSING INTR AMPL: 20.75 mV
MDC IDC PG IMPLANT DT: 20170802
MDC IDC SESS DTM: 20190327123922
MDC IDC SET LEADCHNL LV PACING AMPLITUDE: 3.25 V
MDC IDC SET LEADCHNL LV PACING PULSEWIDTH: 0.8 ms
MDC IDC SET LEADCHNL RV PACING AMPLITUDE: 2 V
MDC IDC SET LEADCHNL RV SENSING SENSITIVITY: 2.8 mV
MDC IDC STAT BRADY AS VP PERCENT: 86.12 %
MDC IDC STAT BRADY RA PERCENT PACED: 11.8 %

## 2017-08-08 ENCOUNTER — Encounter: Payer: BLUE CROSS/BLUE SHIELD | Admitting: Physician Assistant

## 2017-08-15 ENCOUNTER — Other Ambulatory Visit (INDEPENDENT_AMBULATORY_CARE_PROVIDER_SITE_OTHER): Payer: Self-pay

## 2017-08-15 DIAGNOSIS — Z79899 Other long term (current) drug therapy: Secondary | ICD-10-CM

## 2017-08-15 DIAGNOSIS — E785 Hyperlipidemia, unspecified: Secondary | ICD-10-CM

## 2017-08-15 DIAGNOSIS — E538 Deficiency of other specified B group vitamins: Secondary | ICD-10-CM

## 2017-08-16 LAB — COMPREHENSIVE METABOLIC PANEL
ALT: 24 IU/L (ref 0–44)
AST: 28 IU/L (ref 0–40)
Albumin/Globulin Ratio: 1.6 (ref 1.2–2.2)
Albumin: 4.2 g/dL (ref 3.6–4.8)
Alkaline Phosphatase: 91 IU/L (ref 39–117)
BUN/Creatinine Ratio: 13 (ref 10–24)
BUN: 12 mg/dL (ref 8–27)
Bilirubin Total: 1.8 mg/dL — ABNORMAL HIGH (ref 0.0–1.2)
CALCIUM: 9.5 mg/dL (ref 8.6–10.2)
CO2: 26 mmol/L (ref 20–29)
Chloride: 104 mmol/L (ref 96–106)
Creatinine, Ser: 0.9 mg/dL (ref 0.76–1.27)
GFR, EST AFRICAN AMERICAN: 104 mL/min/{1.73_m2} (ref >59)
GFR, EST NON AFRICAN AMERICAN: 90 mL/min/{1.73_m2} (ref >59)
GLUCOSE: 105 mg/dL — AB (ref 65–99)
Globulin, Total: 2.6 g/dL (ref 1.5–4.5)
Potassium: 4.7 mmol/L (ref 3.5–5.2)
Sodium: 141 mmol/L (ref 134–144)
TOTAL PROTEIN: 6.8 g/dL (ref 6.0–8.5)

## 2017-08-16 LAB — LIPID PANEL
CHOL/HDL RATIO: 2.5 ratio (ref 0.0–5.0)
Cholesterol, Total: 120 mg/dL (ref 100–199)
HDL: 48 mg/dL (ref >39)
LDL Calculated: 58 mg/dL (ref 0–99)
Triglycerides: 69 mg/dL (ref 0–149)
VLDL Cholesterol Cal: 14 mg/dL (ref 5–40)

## 2017-08-16 LAB — B12 AND FOLATE PANEL
Folate: 19.6 ng/mL (ref >3.0)
Vitamin B-12: 423 pg/mL (ref 232–1245)

## 2017-09-10 ENCOUNTER — Telehealth: Payer: Self-pay

## 2017-09-10 ENCOUNTER — Encounter: Payer: Self-pay | Admitting: *Deleted

## 2017-09-10 NOTE — Telephone Encounter (Signed)
Confirmed remote transmission w/ pt wife.   

## 2017-09-11 ENCOUNTER — Encounter: Payer: Self-pay | Admitting: Cardiology

## 2017-09-16 ENCOUNTER — Ambulatory Visit: Payer: Self-pay | Admitting: Family Medicine

## 2017-09-22 ENCOUNTER — Ambulatory Visit: Payer: Self-pay | Admitting: Physician Assistant

## 2017-09-23 ENCOUNTER — Ambulatory Visit: Payer: Self-pay | Admitting: Physician Assistant

## 2017-10-27 ENCOUNTER — Ambulatory Visit: Payer: Self-pay | Admitting: Family Medicine

## 2017-11-03 ENCOUNTER — Encounter: Payer: Self-pay | Admitting: Physician Assistant

## 2017-12-03 ENCOUNTER — Other Ambulatory Visit: Payer: Self-pay | Admitting: Physician Assistant

## 2017-12-23 ENCOUNTER — Telehealth: Payer: Self-pay | Admitting: *Deleted

## 2017-12-23 NOTE — Telephone Encounter (Signed)
   [facility] Pre-operative Risk Assessment    Request for surgical clearance:  1. What type of surgery is being performed? Extractions 3 teeth w/ possible bone graft and implants   2. When is this surgery scheduled? TBD   3. What type of clearance is required (medical clearance vs. Pharmacy clearance to hold med vs. Both)? Medical   4. Are there any medications that need to be held prior to surgery and how long?n/a   5. Practice name and name of physician performing surgery? [facility]  6. What is your office phone number [phone]    7.   What is your office fax number [phone]  8.   Anesthesia type (None, local, MAC, general) ? IV sedation    George [name] 12/23/2017, 10:55 AM  _________________________________________________________________   (provider comments below)

## 2017-12-26 NOTE — Telephone Encounter (Signed)
Called pt re: surgical clearance and the fact that he will need an o/v before he can be cleared. Left a detailed message for pt to call the office and make an appt.

## 2017-12-26 NOTE — Telephone Encounter (Signed)
   Primary Cardiologist:Steven Caryl Comes, MD  Chart reviewed as part of pre-operative protocol coverage. Because of George [name]'s past medical history and time since last visit, he/she will require a follow-up visit in order to better assess preoperative cardiovascular risk.  Pre-op covering staff: - Please schedule appointment and call patient to inform them. - Please contact requesting surgeon's office via preferred method (i.e, phone, fax) to inform them of need for appointment prior to surgery.  Cecilie Kicks, NP  12/26/2017, 12:11 PM

## 2017-12-29 NOTE — Telephone Encounter (Signed)
Left message to make appt before he can be cleared. Pt does have appt with Tommye Standard , PA 11/6

## 2017-12-30 NOTE — Telephone Encounter (Signed)
Left message to see if pt wanted a sooner appt with Tommye Standard, PA than the 01/21/18 appt. Lmom pt can call back and let the operator know he is calling to schedule a sooner appt if he would like or he may keep the 01/21/18 appt as planned. I will remove this from the call back pool.

## 2018-01-02 ENCOUNTER — Encounter: Payer: Self-pay | Admitting: Physician Assistant

## 2018-01-18 NOTE — Progress Notes (Addendum)
Cardiology Office Note Date:  01/18/2018  Patient ID:  George [name], George [name] 1950/12/30, MRN [id number] PCP:  Mellody Dance, DO  Cardiologist:  Dr. Radford Pax Electrophysiologist: Dr. Caryl Comes    Chief Complaint: annual f/u, pre-op  History of Present Illness: George [name] is a 67 y.o. male with history of HTN, , 2nd degree AVblock,II, s/p CRT-P.  He comes in to the office today to be seen for Dr. Caryl Comes.  He was last seen by him 02/02/16 with improving exertional capacity c/o palpitations and noting PVC', he was subsequently seen by myself 02/16/16 noting only 94% BiVe pacing, NSVT and his coreg up-titrated further. 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  No dizziness, near syncope or syncope. He had 2 AFib episodes noted on his device, longest 28min26 seconds No changes were made to his tx, planned for an echo to eval his LVEf and +/- ACE/ARB pending that. LVEF was 50-55%, no changes were made.  He feels very good.  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  No CP, palpitations.  No rest SOB, no DOE, no symptoms of PND.  No dizziness, near syncope or syncope.  He will infrequently feel lightheaded upon standing especially when he has been stooped/crouched down doing something.  He has seen a different oral surgeon, and thinks they have decided to do extractions, rather then grafting and other things.  This will be done without sedation, final decision not yet made.  RCRI score is 0.4, has hx of mild CM (with recovered LVEF), never CHF DUKE score is 58.2, METS 9.89  Device  information:  MDT CRT-P, implanted 10/18/15, Dr. Caryl Comes   Past Medical History:  Diagnosis Date  . Arthritis THUMBS  . LBBB (left bundle branch block)   . Peyronie's disease     Past Surgical History:  Procedure Laterality Date  . CARDIOVASCULAR STRESS TEST  10-18-2002  DR  CRENSHAW   NORMAL ADENOSINE CARDIOLITE/ EF 60%  . CATARACT EXTRACTION W/ INTRAOCULAR LENS IMPLANT  2006   LEFT EYE  . EP IMPLANTABLE DEVICE N/A 10/18/2015   Procedure: BiV Pacemaker Insertion CRT-P;  Surgeon: [name], MD;  Location: [facility];  Service: Cardiovascular;  Laterality: N/A;  . LAPAROSCOPIC CHOLECYSTECTOMY  10-09-2007  . NESBIT PROCEDURE  01/17/2012   Procedure: NESBIT PROCEDURE;  Surgeon: [name], MD;  Location: [facility];  Service: Urology;  Laterality: N/A;  16 dot plication   . [place], LEFT EYE  1993  . REPAIR LEFT INGUINAL HERNIA  2005    Current Outpatient Medications  Medication Sig Dispense Refill  . atorvastatin (LIPITOR) 40 MG tablet Take 1 tablet (40 mg total) by mouth at bedtime. 90 tablet 3  . carvedilol (COREG) 25 MG tablet Take 1 tablet (25 mg total) by mouth 2 (two) times daily with a meal. Please make appt with Dr. Caryl Comes for November. 1st attempt 180 tablet 0  . Cholecalciferol (VITAMIN D3) 5000 units TABS 5,000 IU OTC vitamin D3 daily. 90 tablet 3  . loratadine (CLARITIN) 10 MG  tablet Take 10 mg by mouth daily as needed.     . Magnesium 200 MG TABS Take 1 tablet by mouth daily.    . vitamin B-12 (CYANOCOBALAMIN) 1000 MCG tablet Take 1 tablet (1,000 mcg total) by mouth 2 (two) times daily.    . vitamin E 100 UNIT capsule Take 100 Units by mouth daily.     No current facility-administered medications for this visit.     Allergies:   Patient has no known allergies.   Social History:  The patient  reports that he has never smoked. He has never used smokeless tobacco. He reports that he does not drink alcohol or use drugs.    Family History:  The patient's family history includes Arrhythmia in his father; Diabetes in his mother; Healthy in his daughter and son; Heart attack in his paternal grandfather; Heart disease in his father and paternal grandfather; Hyperlipidemia in his father and mother; Hypertension in his father and mother.  ROS:  Please see the history of present illness.  All other systems are reviewed and otherwise negative.   PHYSICAL EXAM:  VS:  There were no vitals taken for this visit. BMI: There is no height or weight on file to calculate BMI. Well nourished, well developed, in no acute distress  HEENT: normocephalic, atraumatic  Neck: no JVD, carotid bruits or masses Cardiac:  RRR; no significant murmurs, no rubs, or gallops Lungs:  CTA b/l, no wheezing, rhonchi or rales  Abd: soft, nontender MS: no deformity or atrophy Ext:  no edema  Skin: warm and dry, no rash Neuro:  No gross deficits appreciated Psych: euthymic mood, full affect  PPM site is stable, no tethering or discomfort   EKG:  Done today and reviewed by myself;  SR/VP ICD interrogation done today and reviewed by myself:  Battery and lead testing is good.  No R waves today.  RV amplitude reprogrammed to minimum of 2.5V.  Several NSVT episodes, EGMs available reviewed, most appear to be 1:1 A driven, all 1 second or less.  One VT monitored episode 13 seconds, this looks like VT  01/31/17: TTE Study Conclusions - Left ventricle: The cavity size was normal. Systolic function was   normal. The estimated ejection fraction was in the range of 50%   to 55%. Wall motion was normal; there were no regional wall   motion abnormalities. Doppler parameters are consistent with   abnormal left ventricular relaxation (grade 1 diastolic   dysfunction). - Aortic valve: There was mild regurgitation. - Aorta: Aortic root dimension: 39 mm (ED). - Ascending aorta: The ascending aorta was mildly dilated. - Mitral valve: There was mild  regurgitation. - Right ventricle: The cavity size was moderately dilated. Wall   thickness was normal. Pacer wire or catheter noted in right   ventricle. - Right atrium: The atrium was mildly dilated. Impressions: - EF mildly improved from prior study.  10/16/15: TTE Study Conclusions - Left ventricle: The cavity size was normal. Wall thickness was   normal. Systolic function was mildly reduced. The estimated   ejection fraction was in the range of 45% to 50%. Mild diffuse   hypokinesis with no identifiable regional variations. - Ventricular septum: Septal motion showed paradox. - Aortic valve: There was mild regurgitation. - Mitral valve: Systolic bowing without prolapse. There was mild   regurgitation. - Tricuspid valve: Mild prolapse. - Pulmonary arteries: PA peak pressure: 31 mm Hg (S). Impressions: - Second degree AV block and Incessant PVCs are present throughout  the study and limit evaluation of systolic and diastolic   function.    Recent Labs: 08/15/2017: ALT 24; BUN 12; Creatinine, Ser 0.90; Potassium 4.7; Sodium 141  08/15/2017: Chol/HDL Ratio 2.5; Cholesterol, Total 120; HDL 48; LDL Calculated 58; Triglycerides 69   CrCl cannot be calculated (Patient's most recent lab result is older than the maximum 21 days allowed.).   Wt Readings from Last 3 Encounters:  06/05/17 165 lb 12.8 oz (75.2 kg)  05/20/17 165 lb (74.8 kg)  05/13/17 166 lb 11.2 oz (75.6 kg)     Other studies reviewed: Additional studies/records reviewed today include: summarized above  ASSESSMENT AND PLAN:  1. Heart block s/p PPM     intact device function, reprogrammed as above     97 % BiVe pacing  2. Mild CM     Exam is euvolemic, Optivol was trending upwards, leveling off     Weight is down     No symptoms  3. Known hx of PVC's, NSVT     VS pacing down to 1.8%     VT episode noted back in August, asymptomatic     He admits to occasionally missing his coreg     LVEF by his echo last  year is improved to 50-55%, no WMA              4. Pre-op     Oral surgery w/IV sedation planned ? Perhaps will be deferring to extraction alone w/o sedation     Medical clearance is deferred to his PMD     See below, will get LHC prior given VT episode       Disposition: he missed his last remote, reminded of the importance of this, and taking his medicine every day, his coreg twice daily.  Will continue Q 3 month remote checks, see him back in 4 mo, keep an eye on AT/V events as well.  BMET and mag today   ADDEND: After further thought regarding VT episode back in August, I reviewed the patient's chart/case/today's visit with Dr. Radford Pax.  She feels best to have the patient undergo LHC to evaluate for possible CAD, and hold off on dental extraction until afterwards. I have placed a call to the patient on his cell and home phone to give me a call.  ADDEND: Patient called, discussed Dr. Theodosia Blender thoughts and recommendation for LHC, rational and indication.  Discussed heart cath procedure, potential benift and risk, he is agreeable to proceed.  Is aware would like to do this prior to teeth extraction.  He is agreeable to proceed,  will sit with his wife and his calendar and call back to schedule.  Will need a CBC done.  Current medicines are reviewed at length with the patient today.  The patient did not have any concerns regarding medicines.  Haywood Lasso, PA-C 01/18/2018 4:22 PM     [place] [address] [phone] (office)  [phone] (fax)

## 2018-01-18 NOTE — H&P (View-Only) (Signed)
Cardiology Office Note Date:  01/18/2018  Patient ID:  George [name], George [name] 1950/10/05, MRN [id number] PCP:  Mellody Dance, DO  Cardiologist:  Dr. Radford Pax Electrophysiologist: Dr. Caryl Comes    Chief Complaint: annual f/u, pre-op  History of Present Illness: George [name] is a 67 y.o. male with history of HTN, , 2nd degree AVblock,II, s/p CRT-P.  He comes in to the office today to be seen for Dr. Caryl Comes.  He was last seen by him 02/02/16 with improving exertional capacity c/o palpitations and noting PVC', he was subsequently seen by myself 02/16/16 noting only 94% BiVe pacing, NSVT and his coreg up-titrated further. 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  No dizziness, near syncope or syncope. He had 2 AFib episodes noted on his device, longest 39min26 seconds No changes were made to his tx, planned for an echo to eval his LVEf and +/- ACE/ARB pending that. LVEF was 50-55%, no changes were made.  He feels very good.  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  No CP, palpitations.  No rest SOB, no DOE, no symptoms of PND.  No dizziness, near syncope or syncope.  He will infrequently feel lightheaded upon standing especially when he has been stooped/crouched down doing something.  He has seen a different oral surgeon, and thinks they have decided to do extractions, rather then grafting and other things.  This will be done without sedation, final decision not yet made.  RCRI score is 0.4, has hx of mild CM (with recovered LVEF), never CHF DUKE score is 58.2, METS 9.89  Device  information:  MDT CRT-P, implanted 10/18/15, Dr. Caryl Comes   Past Medical History:  Diagnosis Date  . Arthritis THUMBS  . LBBB (left bundle branch block)   . Peyronie's disease     Past Surgical History:  Procedure Laterality Date  . CARDIOVASCULAR STRESS TEST  10-18-2002  DR  CRENSHAW   NORMAL ADENOSINE CARDIOLITE/ EF 60%  . CATARACT EXTRACTION W/ INTRAOCULAR LENS IMPLANT  2006   LEFT EYE  . EP IMPLANTABLE DEVICE N/A 10/18/2015   Procedure: BiV Pacemaker Insertion CRT-P;  Surgeon: [name], MD;  Location: [facility];  Service: Cardiovascular;  Laterality: N/A;  . LAPAROSCOPIC CHOLECYSTECTOMY  10-09-2007  . NESBIT PROCEDURE  01/17/2012   Procedure: NESBIT PROCEDURE;  Surgeon: [name], MD;  Location: [facility];  Service: Urology;  Laterality: N/A;  16 dot plication   . [place], LEFT EYE  1993  . REPAIR LEFT INGUINAL HERNIA  2005    Current Outpatient Medications  Medication Sig Dispense Refill  . atorvastatin (LIPITOR) 40 MG tablet Take 1 tablet (40 mg total) by mouth at bedtime. 90 tablet 3  . carvedilol (COREG) 25 MG tablet Take 1 tablet (25 mg total) by mouth 2 (two) times daily with a meal. Please make appt with Dr. Caryl Comes for November. 1st attempt 180 tablet 0  . Cholecalciferol (VITAMIN D3) 5000 units TABS 5,000 IU OTC vitamin D3 daily. 90 tablet 3  . loratadine (CLARITIN) 10 MG  tablet Take 10 mg by mouth daily as needed.     . Magnesium 200 MG TABS Take 1 tablet by mouth daily.    . vitamin B-12 (CYANOCOBALAMIN) 1000 MCG tablet Take 1 tablet (1,000 mcg total) by mouth 2 (two) times daily.    . vitamin E 100 UNIT capsule Take 100 Units by mouth daily.     No current facility-administered medications for this visit.     Allergies:   Patient has no known allergies.   Social History:  The patient  reports that he has never smoked. He has never used smokeless tobacco. He reports that he does not drink alcohol or use drugs.    Family History:  The patient's family history includes Arrhythmia in his father; Diabetes in his mother; Healthy in his daughter and son; Heart attack in his paternal grandfather; Heart disease in his father and paternal grandfather; Hyperlipidemia in his father and mother; Hypertension in his father and mother.  ROS:  Please see the history of present illness.  All other systems are reviewed and otherwise negative.   PHYSICAL EXAM:  VS:  There were no vitals taken for this visit. BMI: There is no height or weight on file to calculate BMI. Well nourished, well developed, in no acute distress  HEENT: normocephalic, atraumatic  Neck: no JVD, carotid bruits or masses Cardiac:  RRR; no significant murmurs, no rubs, or gallops Lungs:  CTA b/l, no wheezing, rhonchi or rales  Abd: soft, nontender MS: no deformity or atrophy Ext:  no edema  Skin: warm and dry, no rash Neuro:  No gross deficits appreciated Psych: euthymic mood, full affect  PPM site is stable, no tethering or discomfort   EKG:  Done today and reviewed by myself;  SR/VP ICD interrogation done today and reviewed by myself:  Battery and lead testing is good.  No R waves today.  RV amplitude reprogrammed to minimum of 2.5V.  Several NSVT episodes, EGMs available reviewed, most appear to be 1:1 A driven, all 1 second or less.  One VT monitored episode 13 seconds, this looks like VT  01/31/17: TTE Study Conclusions - Left ventricle: The cavity size was normal. Systolic function was   normal. The estimated ejection fraction was in the range of 50%   to 55%. Wall motion was normal; there were no regional wall   motion abnormalities. Doppler parameters are consistent with   abnormal left ventricular relaxation (grade 1 diastolic   dysfunction). - Aortic valve: There was mild regurgitation. - Aorta: Aortic root dimension: 39 mm (ED). - Ascending aorta: The ascending aorta was mildly dilated. - Mitral valve: There was mild  regurgitation. - Right ventricle: The cavity size was moderately dilated. Wall   thickness was normal. Pacer wire or catheter noted in right   ventricle. - Right atrium: The atrium was mildly dilated. Impressions: - EF mildly improved from prior study.  10/16/15: TTE Study Conclusions - Left ventricle: The cavity size was normal. Wall thickness was   normal. Systolic function was mildly reduced. The estimated   ejection fraction was in the range of 45% to 50%. Mild diffuse   hypokinesis with no identifiable regional variations. - Ventricular septum: Septal motion showed paradox. - Aortic valve: There was mild regurgitation. - Mitral valve: Systolic bowing without prolapse. There was mild   regurgitation. - Tricuspid valve: Mild prolapse. - Pulmonary arteries: PA peak pressure: 31 mm Hg (S). Impressions: - Second degree AV block and Incessant PVCs are present throughout  the study and limit evaluation of systolic and diastolic   function.    Recent Labs: 08/15/2017: ALT 24; BUN 12; Creatinine, Ser 0.90; Potassium 4.7; Sodium 141  08/15/2017: Chol/HDL Ratio 2.5; Cholesterol, Total 120; HDL 48; LDL Calculated 58; Triglycerides 69   CrCl cannot be calculated (Patient's most recent lab result is older than the maximum 21 days allowed.).   Wt Readings from Last 3 Encounters:  06/05/17 165 lb 12.8 oz (75.2 kg)  05/20/17 165 lb (74.8 kg)  05/13/17 166 lb 11.2 oz (75.6 kg)     Other studies reviewed: Additional studies/records reviewed today include: summarized above  ASSESSMENT AND PLAN:  1. Heart block s/p PPM     intact device function, reprogrammed as above     97 % BiVe pacing  2. Mild CM     Exam is euvolemic, Optivol was trending upwards, leveling off     Weight is down     No symptoms  3. Known hx of PVC's, NSVT     VS pacing down to 1.8%     VT episode noted back in August, asymptomatic     He admits to occasionally missing his coreg     LVEF by his echo last  year is improved to 50-55%, no WMA              4. Pre-op     Oral surgery w/IV sedation planned ? Perhaps will be deferring to extraction alone w/o sedation     Medical clearance is deferred to his PMD     See below, will get LHC prior given VT episode       Disposition: he missed his last remote, reminded of the importance of this, and taking his medicine every day, his coreg twice daily.  Will continue Q 3 month remote checks, see him back in 4 mo, keep an eye on AT/V events as well.  BMET and mag today   ADDEND: After further thought regarding VT episode back in August, I reviewed the patient's chart/case/today's visit with Dr. Radford Pax.  She feels best to have the patient undergo LHC to evaluate for possible CAD, and hold off on dental extraction until afterwards. I have placed a call to the patient on his cell and home phone to give me a call.  ADDEND: Patient called, discussed Dr. Theodosia Blender thoughts and recommendation for LHC, rational and indication.  Discussed heart cath procedure, potential benift and risk, he is agreeable to proceed.  Is aware would like to do this prior to teeth extraction.  He is agreeable to proceed,  will sit with his wife and his calendar and call back to schedule.  Will need a CBC done.  Current medicines are reviewed at length with the patient today.  The patient did not have any concerns regarding medicines.  George Lasso, PA-C 01/18/2018 4:22 PM     [address] [place] [address] [phone] (office)  [phone] (fax)

## 2018-01-21 ENCOUNTER — Ambulatory Visit: Payer: Medicare HMO | Admitting: Physician Assistant

## 2018-01-21 ENCOUNTER — Telehealth: Payer: Self-pay | Admitting: Physician Assistant

## 2018-01-21 VITALS — BP 132/90 | HR 53 | Ht 68.0 in | Wt 160.0 lb

## 2018-01-21 DIAGNOSIS — I442 Atrioventricular block, complete: Secondary | ICD-10-CM

## 2018-01-21 DIAGNOSIS — I472 Ventricular tachycardia, unspecified: Secondary | ICD-10-CM

## 2018-01-21 DIAGNOSIS — Z950 Presence of cardiac pacemaker: Secondary | ICD-10-CM

## 2018-01-21 DIAGNOSIS — I493 Ventricular premature depolarization: Secondary | ICD-10-CM

## 2018-01-21 DIAGNOSIS — I428 Other cardiomyopathies: Secondary | ICD-10-CM | POA: Diagnosis not present

## 2018-01-21 LAB — BASIC METABOLIC PANEL
BUN/Creatinine Ratio: 14 (ref 10–24)
BUN: 12 mg/dL (ref 8–27)
CALCIUM: 9.8 mg/dL (ref 8.6–10.2)
CO2: 27 mmol/L (ref 20–29)
Chloride: 102 mmol/L (ref 96–106)
Creatinine, Ser: 0.86 mg/dL (ref 0.76–1.27)
GFR calc Af Amer: 106 mL/min/{1.73_m2} (ref >59)
GFR calc non Af Amer: 92 mL/min/{1.73_m2} (ref >59)
GLUCOSE: 99 mg/dL (ref 65–99)
Potassium: 4.2 mmol/L (ref 3.5–5.2)
Sodium: 141 mmol/L (ref 134–144)

## 2018-01-21 LAB — MAGNESIUM: MAGNESIUM: 2 mg/dL (ref 1.6–2.3)

## 2018-01-21 MED ORDER — CARVEDILOL 25 MG PO TABS: 25.0000 mg | ORAL_TABLET | Freq: Two times a day (BID) | ORAL | 2 refills | Status: DC

## 2018-01-21 NOTE — Patient Instructions (Addendum)
Your physician recommends that you continue on your current medications as directed. Please refer to the Current Medication list given to you today.   If you need a refill on your cardiac medications before your next appointment, please call your pharmacy.   Labwork:  BMET AND MAG TODAY    Testing/Procedures: NONE ORDERED  TODAY    Follow-Up: Your physician wants you to follow-up in:  IN  [place] will receive a reminder letter in the mail two months in advance. If you don't receive a letter, please call our office to schedule the follow-up appointment.   Remote monitoring is used to monitor your Pacemaker of ICD from home. This monitoring reduces the number of office visits required to check your device to one time per year. It allows [place] to keep an eye on the functioning of your device to ensure it is working properly. You are scheduled for a device check from home on .  2/ 07/2017 You may send your transmission at any time that day. If you have a wireless device, the transmission will be sent automatically. After your physician reviews your transmission, you will receive a postcard with your next transmission date.   Any Other Special Instructions Will Be Listed Below (If Applicable).                                                                                                                                                  '

## 2018-01-21 NOTE — Telephone Encounter (Signed)
Patient's wife Neoma Laming is call to set up his procedure. Please call back.

## 2018-01-22 ENCOUNTER — Encounter: Payer: Self-pay | Admitting: *Deleted

## 2018-01-22 NOTE — Telephone Encounter (Signed)
SPOKE WITH PT WIFE FOR L HEART CATH INSTRUCTIONS FOR PT.  DATE:  03-01-18 7:30 AM   PROVIDER: DR Barbaraann Share AND APPT:   01-23-18  CBC   LETTER LEFT AT FRONT DESK  TO BE PICKED UP   WIFE AGREED AND WILL FOLLOW ALL INSTEUCTIONS

## 2018-01-23 ENCOUNTER — Other Ambulatory Visit: Payer: Medicare HMO | Admitting: *Deleted

## 2018-01-23 DIAGNOSIS — I472 Ventricular tachycardia, unspecified: Secondary | ICD-10-CM

## 2018-01-23 LAB — CBC
Hematocrit: 40.5 % (ref 37.5–51.0)
Hemoglobin: 13.7 g/dL (ref 13.0–17.7)
MCH: 30.4 pg (ref 26.6–33.0)
MCHC: 33.8 g/dL (ref 31.5–35.7)
MCV: 90 fL (ref 79–97)
PLATELETS: 196 10*3/uL (ref 150–450)
RBC: 4.5 x10E6/uL (ref 4.14–5.80)
RDW: 12.3 % (ref 12.3–15.4)
WBC: 6.9 10*3/uL (ref 3.4–10.8)

## 2018-01-27 ENCOUNTER — Telehealth: Payer: Self-pay | Admitting: Physician Assistant

## 2018-01-27 NOTE — Telephone Encounter (Signed)
Returned [place] call and have her aware that pt will be having a cardiac cath and then his clearance will be handled after that. I will copy Tommye Standard, PA-C and her CMA, Frances Furbish to make them aware.  Katharine Look thanked me for the call back.

## 2018-01-27 NOTE — Telephone Encounter (Signed)
Follow up    George [name] with [facility] calling to check on the preop clearance that was sent on 12/23/17. Please call

## 2018-01-28 ENCOUNTER — Telehealth: Payer: Self-pay | Admitting: *Deleted

## 2018-01-28 NOTE — Telephone Encounter (Signed)
Pt contacted pre-catheterization scheduled at [facility] for: Friday January 30, 2018 7:30 AM Verified arrival time and place: [place] Entrance A at: 5:30 AM  No solid food after midnight prior to cath, clear liquids until 5 AM day of procedure. Contrast allergy: no Verified no diabetes medications.  AM meds can be  taken pre-cath with sip of water including: ASA 81 mg  Confirmed patient has responsible person to drive home post procedure and for 24 hours after you arrive home: yes  Reviewed instructions with patient's wife, Jackelyn Poling, she verbalized understanding, thanked me for call.

## 2018-01-30 ENCOUNTER — Encounter (HOSPITAL_COMMUNITY): Payer: Self-pay | Admitting: Interventional Cardiology

## 2018-01-30 ENCOUNTER — Ambulatory Visit (HOSPITAL_COMMUNITY)
Admission: RE | Disposition: A | Discharge status: Home / Self Care | Payer: Self-pay | Source: Ambulatory Visit | Attending: Interventional Cardiology

## 2018-01-30 ENCOUNTER — Other Ambulatory Visit: Payer: Self-pay

## 2018-01-30 ENCOUNTER — Ambulatory Visit (HOSPITAL_COMMUNITY)
Admission: RE | Admit: 2018-01-30 | Discharge: 2018-01-30 | Disposition: A | Discharge status: Home / Self Care | Payer: Medicare HMO | Source: Ambulatory Visit | Attending: Interventional Cardiology | Admitting: Interventional Cardiology

## 2018-01-30 DIAGNOSIS — Z79899 Other long term (current) drug therapy: Secondary | ICD-10-CM | POA: Diagnosis not present

## 2018-01-30 DIAGNOSIS — I472 Ventricular tachycardia, unspecified: Secondary | ICD-10-CM

## 2018-01-30 DIAGNOSIS — Z8249 Family history of ischemic heart disease and other diseases of the circulatory system: Secondary | ICD-10-CM | POA: Insufficient documentation

## 2018-01-30 DIAGNOSIS — I2510 Atherosclerotic heart disease of native coronary artery without angina pectoris: Secondary | ICD-10-CM | POA: Diagnosis not present

## 2018-01-30 DIAGNOSIS — Z9842 Cataract extraction status, left eye: Secondary | ICD-10-CM | POA: Insufficient documentation

## 2018-01-30 DIAGNOSIS — Z9049 Acquired absence of other specified parts of digestive tract: Secondary | ICD-10-CM | POA: Diagnosis not present

## 2018-01-30 DIAGNOSIS — I10 Essential (primary) hypertension: Secondary | ICD-10-CM | POA: Insufficient documentation

## 2018-01-30 DIAGNOSIS — I447 Left bundle-branch block, unspecified: Secondary | ICD-10-CM | POA: Insufficient documentation

## 2018-01-30 DIAGNOSIS — M1990 Unspecified osteoarthritis, unspecified site: Secondary | ICD-10-CM | POA: Diagnosis not present

## 2018-01-30 DIAGNOSIS — I2541 Coronary artery aneurysm: Secondary | ICD-10-CM

## 2018-01-30 DIAGNOSIS — Z9889 Other specified postprocedural states: Secondary | ICD-10-CM | POA: Insufficient documentation

## 2018-01-30 DIAGNOSIS — I4891 Unspecified atrial fibrillation: Secondary | ICD-10-CM | POA: Insufficient documentation

## 2018-01-30 HISTORY — PX: LEFT HEART CATH AND CORONARY ANGIOGRAPHY: CATH118249

## 2018-01-30 SURGERY — LEFT HEART CATH AND CORONARY ANGIOGRAPHY
Anesthesia: LOCAL

## 2018-01-30 MED ORDER — FENTANYL CITRATE (PF) 100 MCG/2ML IJ SOLN
INTRAMUSCULAR | Status: AC
  Filled 2018-01-30: qty 2

## 2018-01-30 MED ORDER — SODIUM CHLORIDE 0.9 % WEIGHT BASED INFUSION: 1.0000 mL/kg/h | INTRAVENOUS | Status: DC

## 2018-01-30 MED ORDER — ONDANSETRON HCL 4 MG/2ML IJ SOLN: 4.0000 mg | Freq: Four times a day (QID) | INTRAMUSCULAR | Status: DC | PRN

## 2018-01-30 MED ORDER — MIDAZOLAM HCL 2 MG/2ML IJ SOLN
INTRAMUSCULAR | Status: DC | PRN
  Administered 2018-01-30: 0.5 mg via INTRAVENOUS
  Administered 2018-01-30: 1 mg via INTRAVENOUS

## 2018-01-30 MED ORDER — IOHEXOL 350 MG/ML SOLN
INTRAVENOUS | Status: DC | PRN
  Administered 2018-01-30: 85 mL

## 2018-01-30 MED ORDER — SODIUM CHLORIDE 0.9 % IV SOLN: 250.0000 mL | INTRAVENOUS | Status: DC | PRN

## 2018-01-30 MED ORDER — HEPARIN (PORCINE) IN NACL 1000-0.9 UT/500ML-% IV SOLN
INTRAVENOUS | Status: DC | PRN
  Administered 2018-01-30: 500 mL

## 2018-01-30 MED ORDER — VERAPAMIL HCL 2.5 MG/ML IV SOLN
INTRAVENOUS | Status: AC
  Filled 2018-01-30: qty 2

## 2018-01-30 MED ORDER — ASPIRIN 81 MG PO CHEW: 81.0000 mg | CHEWABLE_TABLET | ORAL | Status: DC

## 2018-01-30 MED ORDER — SODIUM CHLORIDE 0.9% FLUSH: 3.0000 mL | Freq: Two times a day (BID) | INTRAVENOUS | Status: DC

## 2018-01-30 MED ORDER — NITROGLYCERIN 1 MG/10 ML FOR IR/CATH LAB
INTRA_ARTERIAL | Status: DC | PRN
  Administered 2018-01-30: 200 ug via INTRACORONARY

## 2018-01-30 MED ORDER — MIDAZOLAM HCL 2 MG/2ML IJ SOLN
INTRAMUSCULAR | Status: AC
  Filled 2018-01-30: qty 2

## 2018-01-30 MED ORDER — LIDOCAINE HCL (PF) 1 % IJ SOLN
INTRAMUSCULAR | Status: DC | PRN
  Administered 2018-01-30: 2 mL

## 2018-01-30 MED ORDER — VERAPAMIL HCL 2.5 MG/ML IV SOLN
INTRAVENOUS | Status: DC | PRN
  Administered 2018-01-30: 10 mL via INTRA_ARTERIAL

## 2018-01-30 MED ORDER — FENTANYL CITRATE (PF) 100 MCG/2ML IJ SOLN
INTRAMUSCULAR | Status: DC | PRN
  Administered 2018-01-30 (×2): 25 ug via INTRAVENOUS

## 2018-01-30 MED ORDER — SODIUM CHLORIDE 0.9% FLUSH: 3.0000 mL | INTRAVENOUS | Status: DC | PRN

## 2018-01-30 MED ORDER — SODIUM CHLORIDE 0.9 % IV SOLN: INTRAVENOUS | Status: DC

## 2018-01-30 MED ORDER — HEPARIN SODIUM (PORCINE) 1000 UNIT/ML IJ SOLN
INTRAMUSCULAR | Status: DC | PRN
  Administered 2018-01-30: 3500 [IU] via INTRAVENOUS

## 2018-01-30 MED ORDER — SODIUM CHLORIDE 0.9 % WEIGHT BASED INFUSION
3.0000 mL/kg/h | INTRAVENOUS | Status: DC
  Administered 2018-01-30: 3 mL/kg/h via INTRAVENOUS

## 2018-01-30 MED ORDER — HEPARIN (PORCINE) IN NACL 1000-0.9 UT/500ML-% IV SOLN
INTRAVENOUS | Status: AC
  Filled 2018-01-30: qty 1000

## 2018-01-30 MED ORDER — NITROGLYCERIN 1 MG/10 ML FOR IR/CATH LAB
INTRA_ARTERIAL | Status: AC
  Filled 2018-01-30: qty 10

## 2018-01-30 MED ORDER — ACETAMINOPHEN 325 MG PO TABS: 650.0000 mg | ORAL_TABLET | ORAL | Status: DC | PRN

## 2018-01-30 MED ORDER — LIDOCAINE HCL (PF) 1 % IJ SOLN
INTRAMUSCULAR | Status: AC
  Filled 2018-01-30: qty 30

## 2018-01-30 MED ORDER — LABETALOL HCL 5 MG/ML IV SOLN
INTRAVENOUS | Status: AC
  Filled 2018-01-30: qty 4

## 2018-01-30 SURGICAL SUPPLY — 9 items
CATH 5FR JL3.5 JR4 ANG PIG MP (CATHETERS) ×2 IMPLANT
DEVICE RAD COMP TR BAND LRG (VASCULAR PRODUCTS) ×2 IMPLANT
GLIDESHEATH SLEND A-KIT 6F 22G (SHEATH) ×2 IMPLANT
GUIDEWIRE INQWIRE 1.5J.035X260 (WIRE) ×1 IMPLANT
INQWIRE 1.5J .035X260CM (WIRE) ×2
KIT HEART LEFT (KITS) ×2 IMPLANT
PACK CARDIAC CATHETERIZATION (CUSTOM PROCEDURE TRAY) ×2 IMPLANT
TRANSDUCER W/STOPCOCK (MISCELLANEOUS) ×2 IMPLANT
TUBING CIL FLEX 10 FLL-RA (TUBING) ×2 IMPLANT

## 2018-01-30 NOTE — Interval H&P Note (Signed)
Cath Lab Visit (complete for each Cath Lab visit)  Clinical Evaluation Leading to the Procedure:   ACS: No.  Non-ACS:    Anginal Classification: CCS II  Anti-ischemic medical therapy: Minimal Therapy (1 class of medications)  Non-Invasive Test Results: Intermediate-risk stress test findings: cardiac mortality 1-3%/year  Prior CABG: No previous CABG      History and Physical Interval Note:  01/30/2018 7:29 AM  George [name]  has presented today for surgery, with the diagnosis of VT  The various methods of treatment have been discussed with the patient and family. After consideration of risks, benefits and other options for treatment, the patient has consented to  Procedure(s): LEFT HEART CATH AND CORONARY ANGIOGRAPHY (N/A) as a surgical intervention .  The patient's history has been reviewed, patient examined, no change in status, stable for surgery.  I have reviewed the patient's chart and labs.  Questions were answered to the patient's satisfaction.     Belva Crome III

## 2018-01-30 NOTE — Discharge Instructions (Signed)
Drink plenty of fluids over next 48 hours and keep right wrist elevated at heart level for 24 hours ° °Radial Site Care °Refer to this sheet in the next few weeks. These instructions provide you with information about caring for yourself after your procedure. Your health care provider may also give you more specific instructions. Your treatment has been planned according to current medical practices, but problems sometimes occur. Call your health care provider if you have any problems or questions after your procedure. °What can I expect after the procedure? °After your procedure, it is typical to have the following: °· Bruising at the radial site that usually fades within 1-2 weeks. °· Blood collecting in the tissue (hematoma) that may be painful to the touch. It should usually decrease in size and tenderness within 1-2 weeks. ° °Follow these instructions at home: °· Take medicines only as directed by your health care provider. °· You may shower 24-48 hours after the procedure or as directed by your health care provider. Remove the bandage (dressing) and gently wash the site with plain soap and water. Pat the area dry with a clean towel. Do not rub the site, because this may cause bleeding. °· Do not take baths, swim, or use a hot tub until your health care provider approves. °· Check your insertion site every day for redness, swelling, or drainage. °· Do not apply powder or lotion to the site. °· Do not flex or bend the affected arm for 24 hours or as directed by your health care provider. °· Do not push or pull heavy objects with the affected arm for 24 hours or as directed by your health care provider. °· Do not lift over 10 lb (4.5 kg) for 5 days after your procedure or as directed by your health care provider. °· Ask your health care provider when it is okay to: °? Return to work or school. °? Resume usual physical activities or sports. °? Resume sexual activity. °· Do not drive home if you are discharged the  same day as the procedure. Have someone else drive you. °· You may drive 24 hours after the procedure unless otherwise instructed by your health care provider. °· Do not operate machinery or power tools for 24 hours after the procedure. °· If your procedure was done as an outpatient procedure, which means that you went home the same day as your procedure, a responsible adult should be with you for the first 24 hours after you arrive home. °· Keep all follow-up visits as directed by your health care provider. This is important. °Contact a health care provider if: °· You have a fever. °· You have chills. °· You have increased bleeding from the radial site. Hold pressure on the site. °Get help right away if: °· You have unusual pain at the radial site. °· You have redness, warmth, or swelling at the radial site. °· You have drainage (other than a small amount of blood on the dressing) from the radial site. °· The radial site is bleeding, and the bleeding does not stop after 30 minutes of holding steady pressure on the site. °· Your arm or hand becomes pale, cool, tingly, or numb. °This information is not intended to replace advice given to you by your health care provider. Make sure you discuss any questions you have with your health care provider. °Document Released: 04/06/2010 Document Revised: 08/10/2015 Document Reviewed: 09/20/2013 °Elsevier Interactive Patient Education © 2018 [employer] ° °

## 2018-01-30 NOTE — CV Procedure (Signed)
   Diagnostic angiography via right radial approach.  50% proximal ramus intermedius followed by 5 to 6 mm diameter saccular aneurysm.  Luminal irregularities noted in RCA, LAD, and ramus intermedius beyond aneurysm.  Mildly depressed LV function.  No immediate complications.  Patient has nonobstructive coronary disease with aneurysm and needs aggressive risk factor modification prevent progression: Blood pressure control with beta-blocker therapy, antiplatelet therapy with Plavix and or aspirin to prevent thrombus formation in the aneurysm with potential for distal embolization, and LDL lowering less than 70.

## 2018-02-02 MED FILL — Labetalol HCl IV Soln Prefilled Syringe 20 MG/4ML (5 MG/ML): INTRAVENOUS | Qty: 4 | Status: AC

## 2018-02-03 ENCOUNTER — Telehealth: Payer: Self-pay | Admitting: Physician Assistant

## 2018-02-03 NOTE — Telephone Encounter (Signed)
Reviewed with Dr. Caryl Comes pacer interrogation, VT episode.  LHC with no obstructive CAD No further w/u needed  Called patient, he is feeling very well, no complaints Asked about starting ASA, given his dental extractions are coming up, would wait until afterwards He is scheduled to have dental extractions Friday this week, "they are not putting me to sleep".  No objection to this.  He is to see [place] 2-3 weeks after his cath, not yet scheduled.  They will call to make this appointment. LVEF 2017 45-50%, 2018 55-60%, by LV gram last week 40% Will discuss add on ACE/ARB at his next visit Dr. Tamala Julian recommended ASA, as above recommended to start after his dental extractions when ok by oral surgeon/dentist.  His cath note mentions favors Plavix over ASA with aneiurysm.  We will discuss this at his f/u as well.  Tommye Standard, PA-C

## 2018-02-11 ENCOUNTER — Telehealth: Payer: Self-pay | Admitting: Physician Assistant

## 2018-02-11 NOTE — Telephone Encounter (Signed)
° ° °  Sandy from provider office is requesting pre op clearance letter be faxed to [phone]. Also requesting details/ outcome of recent labs and cath There a several telephone encounters, pre op clearance initiated 12/23/17   [facility] [place]   phone number [phone]     fax number [phone]

## 2018-02-17 NOTE — Telephone Encounter (Signed)
   Primary Cardiologist:Steven Caryl Comes, MD  Chart reviewed as part of pre-operative protocol coverage. Pre-op clearance already addressed by colleagues in earlier phone notes.   In 02/11/18 phone note, Tommye Standard stated:  Reviewed with Dr. Caryl Comes pacer interrogation, VT episode.  LHC with no obstructive CAD No further w/u needed  Called patient, he is feeling very well, no complaints Asked about starting ASA, given his dental extractions are coming up, would wait until afterwards He is scheduled to have dental extractions Friday this week, "they are not putting me to sleep".  No objection to this.  He is to see [place] 2-3 weeks after his cath, not yet scheduled.  They will call to make this appointment. LVEF 2017 45-50%, 2018 55-60%, by LV gram last week 40% Will discuss add on ACE/ARB at his next visit Dr. Tamala Julian recommended ASA, as above recommended to start after his dental extractions when ok by oral surgeon/dentist.  His cath note mentions favors Plavix over ASA with aneiurysm.  We will discuss this at his f/u as well.  Tommye Standard, PA-C   Recent labs otherwise WNL (CBC, Mg, BMET).  Will route this bundled recommendation to requesting provider via Epic fax function. Please call with questions.  Charlie Pitter, PA-C 02/17/2018, 3:08 PM

## 2018-03-03 ENCOUNTER — Ambulatory Visit: Payer: Medicare HMO | Admitting: Internal Medicine

## 2018-03-03 ENCOUNTER — Encounter: Payer: Self-pay | Admitting: Internal Medicine

## 2018-03-03 VITALS — BP 140/86 | HR 55 | Ht 68.0 in | Wt 161.8 lb

## 2018-03-03 DIAGNOSIS — Z950 Presence of cardiac pacemaker: Secondary | ICD-10-CM

## 2018-03-03 DIAGNOSIS — I442 Atrioventricular block, complete: Secondary | ICD-10-CM

## 2018-03-03 DIAGNOSIS — R001 Bradycardia, unspecified: Secondary | ICD-10-CM

## 2018-03-03 DIAGNOSIS — I472 Ventricular tachycardia, unspecified: Secondary | ICD-10-CM

## 2018-03-03 DIAGNOSIS — I428 Other cardiomyopathies: Secondary | ICD-10-CM

## 2018-03-03 DIAGNOSIS — I493 Ventricular premature depolarization: Secondary | ICD-10-CM

## 2018-03-03 MED ORDER — LOSARTAN POTASSIUM 25 MG PO TABS: 25.0000 mg | ORAL_TABLET | Freq: Every day | ORAL | 3 refills | Status: DC

## 2018-03-03 NOTE — Patient Instructions (Addendum)
Medication Instructions:  Your physician has recommended you make the following change in your medication:   Begin taking Losartan, 25mg  tablet, once per day. Keep account of your blood pressures for the next couple of weeks. You should only take it 2-4 hours after you take your medication, the same time every day, once per day.  If you begin to see a drop, please call us.  Labwork: Please schedule a BMP for 2 weeks from today.  Testing/Procedures: None ordered.  Follow-Up: Your physician recommends that you schedule a follow-up appointment in:   One Year with Dr Caryl Comes  Remote monitoring is used to monitor your Pacemaker of ICD from home. This monitoring reduces the number of office visits required to check your device to one time per year. It allows [place] to keep an eye on the functioning of your device to ensure it is working properly. You are scheduled for a device check from home on 2/5. You may send your transmission at any time that day. If you have a wireless device, the transmission will be sent automatically. After your physician reviews your transmission, you will receive a postcard with your next transmission date.    Any Other Special Instructions Will Be Listed Below (If Applicable).     If you need a refill on your cardiac medications before your next appointment, please call your pharmacy.

## 2018-03-03 NOTE — Progress Notes (Signed)
Patient Care Team: Mellody Dance, DO as PCP - General (Family Medicine) [name], MD as PCP - Cardiology (Cardiology) Wonda Horner, MD as Consulting Physician (Gastroenterology) Allyn Kenner, MD as Consulting Physician (Dermatology) [facility] [place] as Consulting Physician (Optometry) [name], MD as Consulting Physician (Cardiology)   HPI  George [name] is a 67 y.o. male Seen in followup for CRT-P implanted 8/17 for complete heart block, bilateral bundle branch block and cardiomyopathy.  Last summer apparently had nonsustained ventricular tachycardia.  Case was reviewed with Dr. Radford Pax.  Catheterization was recommended.  Results as below.  DATE TEST EF   7/17 Echo   45-50 %   11/18 Echo   55 %   11/19 LHC 40 % Nonobst CAD   No chest pain Some sob no edema    Records and Results Reviewed   Past Medical History:  Diagnosis Date  . Arthritis THUMBS  . LBBB (left bundle branch block)   . Peyronie's disease     Past Surgical History:  Procedure Laterality Date  . CARDIOVASCULAR STRESS TEST  10-18-2002  DR  CRENSHAW   NORMAL ADENOSINE CARDIOLITE/ EF 60%  . CATARACT EXTRACTION W/ INTRAOCULAR LENS IMPLANT  2006   LEFT EYE  . EP IMPLANTABLE DEVICE N/A 10/18/2015   Procedure: BiV Pacemaker Insertion CRT-P;  Surgeon: [name], MD;  Location: [facility];  Service: Cardiovascular;  Laterality: N/A;  . LAPAROSCOPIC CHOLECYSTECTOMY  10-09-2007  . LEFT HEART CATH AND CORONARY ANGIOGRAPHY N/A 01/30/2018   Procedure: LEFT HEART CATH AND CORONARY ANGIOGRAPHY;  Surgeon: Belva Crome, MD;  Location: [facility];  Service: Cardiovascular;  Laterality: N/A;  . NESBIT PROCEDURE  01/17/2012   Procedure: NESBIT PROCEDURE;  Surgeon: [name], MD;  Location: [facility];  Service: Urology;  Laterality: N/A;  16 dot plication   . [place], LEFT EYE  1993  . REPAIR LEFT INGUINAL HERNIA  2005    Current  Outpatient Medications  Medication Sig Dispense Refill  . atorvastatin (LIPITOR) 40 MG tablet Take 1 tablet (40 mg total) by mouth at bedtime. 90 tablet 3  . carvedilol (COREG) 25 MG tablet Take 1 tablet (25 mg total) by mouth 2 (two) times daily with a meal. 180 tablet 2  . Cholecalciferol (VITAMIN D3) 5000 units TABS 5,000 IU OTC vitamin D3 daily. 90 tablet 3  . loratadine (CLARITIN) 10 MG tablet Take 10 mg by mouth daily as needed for allergies.     . Magnesium 250 MG TABS Take 250 mg by mouth daily.    . sildenafil (REVATIO) 20 MG tablet Take 20-100 mg by mouth daily as needed.    . vitamin B-12 (CYANOCOBALAMIN) 1000 MCG tablet Take 1,000 mcg by mouth daily.     No current facility-administered medications for this visit.     No Known Allergies    Review of Systems negative except from HPI and PMH  Physical Exam BP 140/86   Pulse (!) 55   Ht 5\' 8"  (1.727 m)   Wt 161 lb 12.8 oz (73.4 kg)   SpO2 99%   BMI 24.60 kg/m  Well developed and nourished in no acute distress HENT normal Neck supple with JVP-flat Clear Regular rate and rhythm, no murmurs or gallops Abd-soft with active BS No Clubbing cyanosis edema Skin-warm and dry A & Oriented  Grossly normal sensory and motor function   ECG P synchronous pacing  with an upright QRS lead V1 and negative QRS lead I   Assessment and  Plan  High grade heart block  Sinus node function  PVC  CRT-P [employer]   Hypertension  Atrial Tach  Cardiomyopathy non ischemic  AIVR    BP reasonably controlled   Overall doing well.  Exercise tolerance is modestly limited.  Heart rate excursion is poor.  We will reprogram his device to activate rate response  Cardiomyopathy is worsening.  Blood pressures at home have been in the 130+ range.  We will start him on losartan.  Have reviewed side effects.  We will check a BMP in 2 weeks.  We spent more than 50% of our >25 min visit in face to face counseling regarding the  above

## 2018-03-17 ENCOUNTER — Other Ambulatory Visit: Payer: Medicare HMO

## 2018-03-25 ENCOUNTER — Other Ambulatory Visit: Payer: Medicare HMO | Admitting: *Deleted

## 2018-03-25 DIAGNOSIS — Z950 Presence of cardiac pacemaker: Secondary | ICD-10-CM

## 2018-03-25 DIAGNOSIS — R001 Bradycardia, unspecified: Secondary | ICD-10-CM

## 2018-03-25 DIAGNOSIS — I493 Ventricular premature depolarization: Secondary | ICD-10-CM | POA: Diagnosis not present

## 2018-03-25 DIAGNOSIS — I442 Atrioventricular block, complete: Secondary | ICD-10-CM

## 2018-03-25 DIAGNOSIS — I472 Ventricular tachycardia, unspecified: Secondary | ICD-10-CM

## 2018-03-25 DIAGNOSIS — I428 Other cardiomyopathies: Secondary | ICD-10-CM

## 2018-03-25 LAB — BASIC METABOLIC PANEL
BUN/Creatinine Ratio: 16 (ref 10–24)
BUN: 13 mg/dL (ref 8–27)
CALCIUM: 9.8 mg/dL (ref 8.6–10.2)
CO2: 26 mmol/L (ref 20–29)
CREATININE: 0.82 mg/dL (ref 0.76–1.27)
Chloride: 102 mmol/L (ref 96–106)
GFR calc Af Amer: 107 mL/min/{1.73_m2} (ref >59)
GFR calc non Af Amer: 93 mL/min/{1.73_m2} (ref >59)
GLUCOSE: 75 mg/dL (ref 65–99)
Potassium: 4.2 mmol/L (ref 3.5–5.2)
Sodium: 139 mmol/L (ref 134–144)

## 2018-03-27 ENCOUNTER — Telehealth: Payer: Self-pay | Admitting: *Deleted

## 2018-03-27 NOTE — Telephone Encounter (Signed)
Left message to go over lab results. Please refer to phone note from today for complete details.   Julaine Hua, CMA 03/27/2018 11:14 AM

## 2018-03-27 NOTE — Telephone Encounter (Signed)
-----   Message from [name], MD sent at 03/26/2018  9:22 PM EST ----- Please inform patient that drug surveillance labs are normal

## 2018-03-30 ENCOUNTER — Telehealth: Payer: Self-pay | Admitting: Internal Medicine

## 2018-03-30 NOTE — Telephone Encounter (Signed)
Pts wife aware of lab results; She would prefer to not receive calls for normal labs.

## 2018-03-30 NOTE — Telephone Encounter (Signed)
Follow up:    Patient returning call from Friday. Please call patient.

## 2018-04-21 ENCOUNTER — Ambulatory Visit: Payer: Medicare HMO | Admitting: Family Medicine

## 2018-04-22 ENCOUNTER — Ambulatory Visit (INDEPENDENT_AMBULATORY_CARE_PROVIDER_SITE_OTHER): Payer: Medicare HMO

## 2018-04-22 DIAGNOSIS — I428 Other cardiomyopathies: Secondary | ICD-10-CM

## 2018-04-22 DIAGNOSIS — I442 Atrioventricular block, complete: Secondary | ICD-10-CM

## 2018-04-24 LAB — CUP PACEART REMOTE DEVICE CHECK
Brady Statistic AP VP Percent: 49.02 %
Brady Statistic AP VS Percent: 0.1 %
Brady Statistic AS VP Percent: 48.1 %
Brady Statistic RA Percent Paced: 51.22 %
Date Time Interrogation Session: 20200204231706
Implantable Lead Implant Date: 20170802
Implantable Lead Implant Date: 20170802
Implantable Lead Location: 753858
Implantable Lead Location: 753859
Implantable Lead Model: 5076
Implantable Lead Model: 5076
Implantable Pulse Generator Implant Date: 20170802
Lead Channel Impedance Value: 1064 Ohm
Lead Channel Impedance Value: 1140 Ohm
Lead Channel Impedance Value: 304 Ohm
Lead Channel Impedance Value: 437 Ohm
Lead Channel Impedance Value: 532 Ohm
Lead Channel Impedance Value: 532 Ohm
Lead Channel Impedance Value: 798 Ohm
Lead Channel Impedance Value: 798 Ohm
Lead Channel Pacing Threshold Amplitude: 0.75 V
Lead Channel Pacing Threshold Amplitude: 0.875 V
Lead Channel Pacing Threshold Pulse Width: 0.4 ms
Lead Channel Pacing Threshold Pulse Width: 0.8 ms
Lead Channel Sensing Intrinsic Amplitude: 2 mV
Lead Channel Sensing Intrinsic Amplitude: 2 mV
Lead Channel Sensing Intrinsic Amplitude: 8.125 mV
Lead Channel Setting Pacing Amplitude: 1.75 V
Lead Channel Setting Sensing Sensitivity: 2.8 mV
MDC IDC LEAD IMPLANT DT: 20170802
MDC IDC LEAD LOCATION: 753860
MDC IDC MSMT BATTERY REMAINING LONGEVITY: 77 mo
MDC IDC MSMT BATTERY VOLTAGE: 2.98 V
MDC IDC MSMT LEADCHNL LV IMPEDANCE VALUE: 1140 Ohm
MDC IDC MSMT LEADCHNL LV IMPEDANCE VALUE: 494 Ohm
MDC IDC MSMT LEADCHNL LV IMPEDANCE VALUE: 741 Ohm
MDC IDC MSMT LEADCHNL LV IMPEDANCE VALUE: 931 Ohm
MDC IDC MSMT LEADCHNL LV PACING THRESHOLD AMPLITUDE: 2.5 V
MDC IDC MSMT LEADCHNL RA PACING THRESHOLD PULSEWIDTH: 0.4 ms
MDC IDC MSMT LEADCHNL RV IMPEDANCE VALUE: 399 Ohm
MDC IDC MSMT LEADCHNL RV IMPEDANCE VALUE: 475 Ohm
MDC IDC MSMT LEADCHNL RV SENSING INTR AMPL: 8.125 mV
MDC IDC SET LEADCHNL LV PACING AMPLITUDE: 3.5 V
MDC IDC SET LEADCHNL LV PACING PULSEWIDTH: 0.8 ms
MDC IDC SET LEADCHNL RV PACING AMPLITUDE: 2.5 V
MDC IDC SET LEADCHNL RV PACING PULSEWIDTH: 0.4 ms
MDC IDC STAT BRADY AS VS PERCENT: 2.78 %
MDC IDC STAT BRADY RV PERCENT PACED: 97.11 %

## 2018-05-01 NOTE — Progress Notes (Signed)
Remote pacemaker transmission.   

## 2018-05-04 ENCOUNTER — Telehealth: Payer: Self-pay | Admitting: Internal Medicine

## 2018-05-04 NOTE — Telephone Encounter (Signed)
Transmission received, normal transmission no episodes.

## 2018-05-04 NOTE — Telephone Encounter (Signed)
Called patient's wife (DPR) about message. Patient has been getting dizzy from going from squatting and standing while working. Informed patient's wife that he needs to get up slower and maybe sitting before he stands. Patient stated that work will not allow this. Informed patient that his BP might be dropping too much when he stands. Encouraged patient to drink some fluids. Patient also has a device. Asked patient to send in a remote check to see if anything is going on other than his BP dropping when he stands. Will send device clinic message. Will also send message to Dr. Caryl Comes and his nurse for advisement.

## 2018-05-04 NOTE — Telephone Encounter (Signed)
I spoke with patient wife about sending a manual transmission with his monitor. She states she did not know when they will do it but will try to get it done.

## 2018-05-04 NOTE — Telephone Encounter (Signed)
New Message   STAT if patient feels like he/she is going to faint   1) Are you dizzy now? No, sitting for 15 minutes. PTs wife said he was squating and standing up while working and will get dizziness.   2) Do you feel faint or have you passed out? No  3) Do you have any other symptoms? No   4) Have you checked your HR and BP (record if available)? No   PTs wife is calling because the PT has been having dizziness while working. Scheduled an appt for 2/26 and 915am   PTs wife asks if you can send a text because they are working at a school and done get service, she says after she gets a text she can call right back

## 2018-05-05 NOTE — Telephone Encounter (Signed)
Spoke with pts wife today. She is aware of the normal remote transmission.   I advised her to start to track pt's bp since he was started on Cozaar in December. I also advised pt to take his time switching positions from squatting to standing. This is primarily when the dizziness occurs. Pts wife states he drinks a 2L of Dr Malachi Bonds daily. I advised on good water intake until urine is clear yellow, possibly alternating between soda and water for now.   Pts wife has agreed with BP monitoring and plans on keeping pts appt next week.  She has no additional questions.

## 2018-05-11 NOTE — Telephone Encounter (Signed)
agreed

## 2018-05-13 ENCOUNTER — Ambulatory Visit: Payer: Medicare HMO | Admitting: Internal Medicine

## 2018-05-15 ENCOUNTER — Ambulatory Visit: Payer: Medicare HMO | Admitting: Family Medicine

## 2018-05-26 ENCOUNTER — Ambulatory Visit (INDEPENDENT_AMBULATORY_CARE_PROVIDER_SITE_OTHER): Payer: Medicare HMO | Admitting: Family Medicine

## 2018-05-26 ENCOUNTER — Encounter: Payer: Self-pay | Admitting: Family Medicine

## 2018-05-26 VITALS — BP 136/78 | HR 62 | Temp 98.1°F | Ht 68.0 in | Wt 166.6 lb

## 2018-05-26 DIAGNOSIS — H01139 Eczematous dermatitis of unspecified eye, unspecified eyelid: Secondary | ICD-10-CM | POA: Diagnosis not present

## 2018-05-26 DIAGNOSIS — X32XXXA Exposure to sunlight, initial encounter: Secondary | ICD-10-CM | POA: Diagnosis not present

## 2018-05-26 DIAGNOSIS — L259 Unspecified contact dermatitis, unspecified cause: Secondary | ICD-10-CM | POA: Diagnosis not present

## 2018-05-26 DIAGNOSIS — L570 Actinic keratosis: Secondary | ICD-10-CM | POA: Insufficient documentation

## 2018-05-26 DIAGNOSIS — K4090 Unilateral inguinal hernia, without obstruction or gangrene, not specified as recurrent: Secondary | ICD-10-CM | POA: Insufficient documentation

## 2018-05-26 MED ORDER — HYDROCORTISONE 1 % EX CREA: 1.0000 "application " | TOPICAL_CREAM | Freq: Two times a day (BID) | CUTANEOUS | 0 refills | Status: DC

## 2018-05-26 MED ORDER — CLOTRIMAZOLE-BETAMETHASONE 1-0.05 % EX CREA: 1.0000 "application " | TOPICAL_CREAM | Freq: Two times a day (BID) | CUTANEOUS | 0 refills | Status: DC

## 2018-05-26 NOTE — Progress Notes (Signed)
Pt here for an acute care OV today  Impression and Recommendations:    1. Inguinal hernia of right side without obstruction or gangrene   2. Eczema of eyelid, unspecified laterality   3. Contact dermatitis, unspecified contact dermatitis type, unspecified trigger-bilateral inferior testes; scattered lesions b/l lower extremity   4. Senile solar keratosis     1. Right Inguinal Hernia - Without Obstruction or Gangrene - Extensively explained and discussed symptoms with patient.   - All questions were answered.  - Risks associated with hernia discussed with patient today. - Reviewed and discussed signs of hernia-related surgical emergency with patient. - Patient has history of hernia in the past, and understands red flag hernia symptoms.  - Patient declines referral to General Surgery at this time. - Additionally advised patient that there is no immediately apparent need to see a surgeon at this time.  However, patient knows to monitor his hernia closely and carefully.  - Patient knows to call in the future if his hernia worsens.  - Recommended being very careful while lifting heavy materials. 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 - Lengthy conversation held about prudent practices while laboring.  2. Bilateral Inferior Testes w/Contact Dermatitis, LE Suspected Bug Bites - Explained that we could apply a Lotrisone cream to treat area around testes. - Lotrisone discussed and provided for groin area.  - Reviewed and discussed possible causes of symptoms with patient today.  - Explained that the areas of patient LE with bumps are likely to be bug bites. - Reviewed the area of irritation around groin and discussed that it resembles contact dermatitis.  - Signs and symptoms of contact dermatitis discussed with patient today.   - Explained that stress can also cause symptoms of contact dermatitis.   - All questions were answered.  3.  Eczema of Eyelid - Advised use of OTC mild 1% hydrocortisone cream to treat eczema around the eyes.  - Advised use of Eucerin cream to moisturize areas of skin concern. - Patient may also use gentle Eucerin facial cleanser for washing. - Advised patient never to use plain soap on his face.  - Patient understands prudent skin care practices.  4. Senile Solar Keratosis - Followed by Dr. Nevada Crane - Advised patient to return to dermatology for yearly skin cancer screening exam. - Prudent skin screening habits discussed and recommended with patient and wife today. - A,B,C,D,E method of skin screening reviewed with patient in office.  - Discussed proper use of sunscreen and UV protection outside, including specialized clothing. - All questions were answered.  5. Chronic Health Screening & Maintenance Recommendations - Discussed need for patient to continue to obtain yearly management and screening.  Educated patient at length about the critical importance of keeping health maintenance up to date.  - Patient also knows to keep up with all established specialists.    - Participated in lengthy conversation and all questions were answered.  Pt was interviewed and evaluated by me in the clinic today for 32.5+ minutes, with over 50% time spent in face to face counseling of patients various medical conditions, treatment plans of those medical conditions including medicine management and lifestyle modification, strategies to improve health and well being; and in coordination of care. SEE ABOVE TREATMENT PLAN FOR DETAILS   Meds ordered this encounter  Medications  . clotrimazole-betamethasone (LOTRISONE) cream    Sig: Apply 1 application topically 2 (two) times daily.    Dispense:  45 g  Refill:  0  . hydrocortisone cream 1 %    Sig: Apply 1 application topically 2 (two) times daily. Apply to eyelids bilaterally    Dispense:  30 g    Refill:  0    There are no discontinued medications.   No  orders of the defined types were placed in this encounter.    Education and routine counseling performed. Handouts provided  Gross side effects, risk and benefits, and alternatives of medications and treatment plan in general discussed with patient.  Patient is aware that all medications have potential side effects and we are unable to predict every side effect or drug-drug interaction that may occur.   Patient will call with any questions prior to using medication if they have concerns.    Expresses verbal understanding and consents to current therapy and treatment regimen.  No barriers to understanding were identified.  Red flag symptoms and signs discussed in detail.  Patient expressed understanding regarding what to do in case of emergency\urgent symptoms   Please see AVS handed out to patient at the end of our visit for further patient instructions/ counseling done pertaining to today's office visit.   Return for 2) Chronic OV w me near future -bring BP log and come fasting.     Note:  This document was prepared occasionally using Dragon voice recognition software and may include unintentional dictation errors in addition to a scribe.  This document serves as a record of services personally performed by Mellody Dance, DO. It was created on her behalf by [name], a trained medical scribe. The creation of this record is based on the scribe's personal observations and the provider's statements to them.   I have reviewed the above medical documentation for accuracy and completeness and I concur.  Mellody Dance, DO 05/26/2018 7:55 PM        --------------------------------------------------------------------------------------------------------------------------    Subjective:    CC:  Chief Complaint  Patient presents with  . Hernia    HPI: George [name] is a 68 y.o. male who presents to [facility] today for issues as discussed  below.  Lengthy conversation held with patient and wife during appointment, including questions regarding Medicare Wellness exams, future follow-up, and insurance concerns.  Hernia Concerns - Right-Sided, Inguinal Notes he has a hernia, he "just noticed it pop out."    Denies pain.  Denies nausea or problems with his bowels.  Comments "I had [a hernia] before, back in '98, and it was on the other side [left inguinal hernia], but it never hurt."  He confirms that he had his left hernia repaired.  Notes "I was getting tired of pushing it back in."  States he "didn't want it getting pinched and gangrene and stuff like that."  Explains that the other hernia would pop out and he'd push it back in.  Notes the same for current hernia.  Skin Concerns - Irritation on Eyelid, Right Temporal Mole, Groin Rash, LE Bumps Experiencing bumps and patches of redness from the waist to the knees.  Has associated itching.  Notes he tries not to scratch the affected area.  They have not changed soaps, he has not changed pants.  Wife notes they have been using al lthe same detergents for a while.  Patient also has areas of skin on his face that concern him, due to flakiness and worsening "scaly appearance" per wife.  In addition, he is also concerned about the skin around his eyelids.  They confirm that they have been under a lot of stress lately.  For dermatology, in the past, patient has followed up with Dr. Nevada Crane.  Patient and his wife note that it's been a while since they have seen him.    Problem  Senile Solar Keratosis  Contact Dermatitis  Eczema of Eyelid  Inguinal Hernia of Right Side Without Obstruction Or Gangrene     Wt Readings from Last 3 Encounters:  05/26/18 166 lb 9.6 oz (75.6 kg)  03/03/18 161 lb 12.8 oz (73.4 kg)  01/30/18 160 lb (72.6 kg)   BP Readings from Last 3 Encounters:  05/26/18 136/78  03/03/18 140/86  01/30/18 125/74   BMI Readings from Last 3 Encounters:  05/26/18  25.33 kg/m  03/03/18 24.60 kg/m  01/30/18 24.33 kg/m     Patient Care Team    Relationship Specialty Notifications Start End  Mellody Dance, DO PCP - General Family Medicine  09/14/15   [name], MD PCP - Cardiology Cardiology Admissions 12/26/17   Wonda Horner, MD Consulting Physician Gastroenterology  11/12/16   Allyn Kenner, MD Consulting Physician Dermatology  11/13/16   Beaulah Corin, [place] Physician Optometry  11/13/16   [name], MD Consulting Physician Cardiology  05/13/17    Comment: PM placement and EP     Patient Active Problem List   Diagnosis Date Noted  . Hyperlipidemia LDL goal <100 05/13/2017    Priority: High  . Senile solar keratosis 05/26/2018  . Contact dermatitis 05/26/2018  . Eczema of eyelid 05/26/2018  . Inguinal hernia of right side without obstruction or gangrene 05/26/2018  . CAD (coronary artery disease), native coronary artery 01/30/2018  . Coronary artery aneurysm 01/30/2018  . VT (ventricular tachycardia) ([place])   . Other male erectile dysfunction- "hx of fractured penis requiring sx" 05/20/2017  . Dizziness 08/21/2016  . BBBB (bilateral bundle branch block) 10/18/2015  . Complete heart block ([place]) 10/18/2015  . Vitamin B12 deficiency 10/06/2015  . Vitamin D insufficiency 10/06/2015  . Bradycardia 10/06/2015  . Hereditary hyperbilirubinemia 10/06/2015  . H/O noncompliance with medical treatment, presenting hazards to health 09/19/2015  . Peyronie's disease 09/19/2015  . DOE (dyspnea on exertion) 09/19/2015  . Unexplained weight loss 09/14/2015  . Essential hypertension 09/14/2015  . h/o Non-compliance: Has not seen a doc in over 13 years 09/14/2015  . Fatigue 09/14/2015      Past Medical History:  Diagnosis Date  . Arthritis THUMBS  . LBBB (left bundle branch block)   . Peyronie's disease      Past Surgical History:  Procedure Laterality Date  . CARDIOVASCULAR STRESS TEST  10-18-2002  DR  CRENSHAW    NORMAL ADENOSINE CARDIOLITE/ EF 60%  . CATARACT EXTRACTION W/ INTRAOCULAR LENS IMPLANT  2006   LEFT EYE  . EP IMPLANTABLE DEVICE N/A 10/18/2015   Procedure: BiV Pacemaker Insertion CRT-P;  Surgeon: [name], MD;  Location: [facility];  Service: Cardiovascular;  Laterality: N/A;  . LAPAROSCOPIC CHOLECYSTECTOMY  10-09-2007  . LEFT HEART CATH AND CORONARY ANGIOGRAPHY N/A 01/30/2018   Procedure: LEFT HEART CATH AND CORONARY ANGIOGRAPHY;  Surgeon: Belva Crome, MD;  Location: [facility];  Service: Cardiovascular;  Laterality: N/A;  . NESBIT PROCEDURE  01/17/2012   Procedure: NESBIT PROCEDURE;  Surgeon: [name], MD;  Location: [facility];  Service: Urology;  Laterality: N/A;  16 dot plication   . [place], LEFT EYE  1993  .  REPAIR LEFT INGUINAL HERNIA  2005     Family History  Problem Relation Age of Onset  . Diabetes Mother   . Hypertension Mother   . Hyperlipidemia Mother   . Heart disease Father   . Hyperlipidemia Father   . Hypertension Father   . Arrhythmia Father        paf  . Healthy Daughter   . Healthy Son   . Heart disease Paternal Grandfather   . Heart attack Paternal Grandfather      Social History   Socioeconomic History  . Marital status: Married    Spouse name: Not on file  . Number of children: Not on file  . Years of education: Not on file  . Highest education level: Not on file  Occupational History  . Not on file  Social Needs  . Financial resource strain: Not on file  . Food insecurity:    Worry: Not on file    Inability: Not on file  . Transportation needs:    Medical: Not on file    Non-medical: Not on file  Tobacco Use  . Smoking status: Never Smoker  . Smokeless tobacco: Never Used  Substance and Sexual Activity  . Alcohol use: No  . Drug use: No  . Sexual activity: Yes  Lifestyle  . Physical activity:    Days per week: Not on file    Minutes per session: Not on file  . Stress: Not  on file  Relationships  . Social connections:    Talks on phone: Not on file    Gets together: Not on file    Attends religious service: Not on file    Active member of club or organization: Not on file    Attends meetings of clubs or organizations: Not on file    Relationship status: Not on file  . Intimate partner violence:    Fear of current or ex partner: Not on file    Emotionally abused: Not on file    Physically abused: Not on file    Forced sexual activity: Not on file  Other Topics Concern  . Not on file  Social History Narrative  . Not on file     No outpatient medications have been marked as taking for the 05/26/18 encounter (Office Visit) with Mellody Dance, DO.    Allergies:  No Known Allergies   Review of Systems: General:   Denies fever, chills, unexplained weight loss.  Optho/Auditory:   Denies visual changes, blurred vision/LOV Respiratory:   Denies wheeze, DOE more than baseline levels.   Cardiovascular:   Denies chest pain, palpitations, new onset peripheral edema  Gastrointestinal:   Denies nausea, vomiting, diarrhea, abd pain.  Genitourinary: Denies dysuria, freq/ urgency, flank pain or discharge from genitals.  Endocrine:     Denies hot or cold intolerance, polyuria, polydipsia. Musculoskeletal:   Denies unexplained myalgias, joint swelling, unexplained arthralgias, gait problems.  Skin:  Denies new onset rash, suspicious lesions Neurological:     Denies dizziness, unexplained weakness, numbness  Psychiatric/Behavioral:   Denies mood changes, suicidal or homicidal ideations, hallucinations    Objective:   Blood pressure 136/78, pulse 62, temperature 98.1 F (36.7 C), height 5\' 8"  (1.727 m), weight 166 lb 9.6 oz (75.6 kg), SpO2 99 %. Body mass index is 25.33 kg/m. General:  Well Developed, well nourished, appropriate for stated age.  Neuro:  Alert and oriented,  extra-ocular muscles intact  HEENT:  Normocephalic, atraumatic, neck supple Skin:   Base of  his bilateral testes appear with an erythematous rash that is confluent.  No flakiness of the skin, no pustules.  Patient also with 8-10 scattered erythematous papules along bilateral lower extremities, less than 2 mm diameter.  Hyperkeratotic and hypermelanotic skin lesion in right temporal area that is approximately 5 mm diameter, consistent with actinic or solar keratosis.  Slight erythema and flakiness of skin on left upper eyelid.  Otherwise warm, pink. Cardiac:  RRR, S1 S2 Respiratory:  ECTA B/L and A/P, Not using accessory muscles, speaking in full sentences- unlabored. Vascular:  Ext warm, no cyanosis apprec.; cap RF less 2 sec. Psych:  No HI/SI, judgement and insight good, Euthymic mood. Full Affect. Groin:  Right groin region appears enlarged, however on palpation, no protuberance through abdominal wall or inguinal canal appreciated.  Inferior aspect of testes bilaterally with erythematous, confluent rash that is slightly raised.

## 2018-05-26 NOTE — Patient Instructions (Signed)
Please be checking your blood pressure at home as today it was a little elevated and I know Dr. Caryl Comes started losartan due to worsening cardiomyopathy and he desired tighter control of your blood pressure. -Please be checking it at home at least 3-4 times weekly and write it down.  Bring in this log to all office visits with your doctors.  -Follow-up near future for office visit with me to review chronic conditions and also come in fasting for blood work since you have not had it in a long time     Inguinal Hernia, Adult An inguinal hernia develops when fat or the intestines push through a weak spot in a muscle where your leg meets your lower abdomen (groin). This creates a bulge. This kind of hernia could also be:  In your scrotum, if you are male.  In folds of skin around your vagina, if you are male. There are three types of inguinal hernias:  Hernias that can be pushed back into the abdomen (are reducible). This type rarely causes pain.  Hernias that are not reducible (are incarcerated).  Hernias that are not reducible and lose their blood supply (are strangulated). This type of hernia requires emergency surgery. What are the causes? This condition is caused by having a weak spot in the muscles or tissues in the groin. This weak spot develops over time. The hernia may poke through the weak spot when you suddenly strain your lower abdominal muscles, such as when you:  Lift a heavy object.  Strain to have a bowel movement. Constipation can lead to straining.  Cough. What increases the risk? This condition is more likely to develop in:  Men.  Pregnant women.  People who: ? Are overweight. ? Work in jobs that require long periods of standing or heavy lifting. ? Have had an inguinal hernia before. ? Smoke or have lung disease. These factors can lead to long-lasting (chronic) coughing. What are the signs or symptoms? Symptoms may depend on the size of the hernia. Often, a  small inguinal hernia has no symptoms. Symptoms of a larger hernia may include:  A lump in the groin area. This is easier to see when standing. It might not be visible when lying down.  Pain or burning in the groin. This may get worse when lifting, straining, or coughing.  A dull ache or a feeling of pressure in the groin.  In men, an unusual lump in the scrotum. Symptoms of a strangulated inguinal hernia may include:  A bulge in your groin that is very painful and tender to the touch.  A bulge that turns red or purple.  Fever, nausea, and vomiting.  Inability to have a bowel movement or to pass gas. How is this diagnosed? This condition is diagnosed based on your symptoms, your medical history, and a physical exam. Your health care provider may feel your groin area and ask you to cough. How is this treated? Treatment depends on the size of your hernia and whether you have symptoms. If you do not have symptoms, your health care provider may have you watch your hernia carefully and have you come in for follow-up visits. If your hernia is large or if you have symptoms, you may need surgery to repair the hernia. Follow these instructions at home: Lifestyle  Avoid lifting heavy objects.  Avoid standing for long periods of time.  Do not use any products that contain nicotine or tobacco, such as cigarettes and e-cigarettes. If you need help  quitting, ask your health care provider.  Maintain a healthy weight. Preventing constipation  Take actions to prevent constipation. Constipation leads to straining with bowel movements, which can make a hernia worse or cause a hernia repair to break down. Your health care provider may recommend that you: ? Drink enough fluid to keep your urine pale yellow. ? Eat foods that are high in fiber, such as fresh fruits and vegetables, whole grains, and beans. ? Limit foods that are high in fat and processed sugars, such as fried or sweet foods. ? Take an  over-the-counter or prescription medicine for constipation. General instructions  You may try to push the hernia back in place by very gently pressing on it while lying down. Do not try to force the bulge back in if it will not push in easily.  Watch your hernia for any changes in shape, size, or color. Get help right away if you notice any changes.  Take over-the-counter and prescription medicines only as told by your health care provider.  Keep all follow-up visits as told by your health care provider. This is important. Contact a health care provider if:  You have a fever.  You develop new symptoms.  Your symptoms get worse. Get help right away if:  You have pain in your groin that suddenly gets worse.  You have a bulge in your groin that: ? Suddenly gets bigger and does not get smaller. ? Becomes red or purple or painful to the touch.  You are a man and you have a sudden pain in your scrotum, or the size of your scrotum suddenly changes.  You cannot push the hernia back in place by very gently pressing on it when you are lying down. Do not try to force the bulge back in if it will not push in easily.  You have nausea or vomiting that does not go away.  You have a fast heartbeat.  You cannot have a bowel movement or pass gas. These symptoms may represent a serious problem that is an emergency. Do not wait to see if the symptoms will go away. Get medical help right away. Call your local emergency services (911 in the [place] Summary  An inguinal hernia develops when fat or the intestines push through a weak spot in a muscle where your leg meets your lower abdomen (groin).  This condition is caused by having a weak spot in muscles or tissue in your groin.  Symptoms may depend on the size of the hernia, and they may include pain or swelling in your groin. A small inguinal hernia often has no symptoms.  Treatment may not be needed if you do not have symptoms. If you have  symptoms or a large hernia, you may need surgery to repair the hernia.  Avoid lifting heavy objects. Also avoid standing for long amounts of time. This information is not intended to replace advice given to you by your health care provider. Make sure you discuss any questions you have with your health care provider. Document Released: 07/21/2008 Document Revised: 04/05/2017 Document Reviewed: 12/04/2016 Elsevier Interactive Patient Education  2019 [place] Dermatitis Dermatitis is redness, soreness, and swelling (inflammation) of the skin. Contact dermatitis is a reaction to certain substances that touch the skin. Many different substances can cause contact dermatitis. There are two types of contact dermatitis:  Irritant contact dermatitis. This type is caused by something that irritates your skin, such as having dry hands from washing  them too often with soap. This type does not require previous exposure to the substance for a reaction to occur. This is the most common type.  Allergic contact dermatitis. This type is caused by a substance that you are allergic to, such as poison ivy. This type occurs when you have been exposed to the substance (allergen) and develop a sensitivity to it. Dermatitis may develop soon after your first exposure to the allergen, or it may not develop until the next time you are exposed and every time thereafter. What are the causes? Irritant contact dermatitis is most commonly caused by exposure to:  Makeup.  Soaps.  Detergents.  Bleaches.  Acids.  Metal salts, such as nickel. Allergic contact dermatitis is most commonly caused by exposure to:  Poisonous plants.  Chemicals.  Jewelry.  Latex.  Medicines.  Preservatives in products, such as clothing. What increases the risk? You are more likely to develop this condition if you have:  A job that exposes you to irritants or allergens.  Certain medical conditions, such as  asthma or eczema. What are the signs or symptoms? Symptoms of this condition may occur on your body anywhere the irritant has touched you or is touched by you.  Symptoms include: ? Dryness or flaking. ? Redness. ? Cracks. ? Itching. ? Pain or a burning feeling. ? Blisters. ? Drainage of small amounts of blood or clear fluid from skin cracks. With allergic contact dermatitis, there may also be swelling in areas such as the eyelids, mouth, or genitals. How is this diagnosed? This condition is diagnosed with a medical history and physical exam.  A patch skin test may be performed to help determine the cause.  If the condition is related to your job, you may need to see an occupational medicine specialist. How is this treated? This condition is treated by checking for the cause of the reaction and protecting your skin from further contact. Treatment may also include:  Steroid creams or ointments. Oral steroid medicines may be needed in more severe cases.  Antibiotic medicines or antibacterial ointments, if a skin infection is present.  Antihistamine lotion or an antihistamine taken by mouth to ease itching.  A bandage (dressing). Follow these instructions at home: Skin care  Moisturize your skin as needed.  Apply cool compresses to the affected areas.  Try applying baking soda paste to your skin. Stir water into baking soda until it reaches a paste-like consistency.  Do not scratch your skin, and avoid friction to the affected area.  Avoid the use of soaps, perfumes, and dyes. Medicines  Take or apply over-the-counter and prescription medicines only as told by your health care provider.  If you were prescribed an antibiotic medicine, take or apply the antibiotic as told by your health care provider. Do not stop using the antibiotic even if your condition improves. Bathing  Try taking a bath with: ? Epsom salts. Follow the instructions on the packaging. You can get these  at your local pharmacy or grocery store. ? Baking soda. Pour a small amount into the bath as directed by your health care provider. ? Colloidal oatmeal. Follow the instructions on the packaging. You can get this at your local pharmacy or grocery store.  Bathe less frequently, such as every other day.  Bathe in lukewarm water. Avoid using hot water. Bandage care  If you were given a bandage (dressing), change it as told by your health care provider.  Wash your hands with soap and water before  and after you change your dressing. If soap and water are not available, use hand sanitizer. General instructions  Avoid the substance that caused your reaction. If you do not know what caused it, keep a journal to try to track what caused it. Write down: ? What you eat. ? What cosmetic products you use. ? What you drink. ? What you wear in the affected area. This includes jewelry.  Check the affected areas every day for signs of infection. Check for: ? More redness, swelling, or pain. ? More fluid or blood. ? Warmth. ? Pus or a bad smell.  Keep all follow-up visits as told by your health care provider. This is important. Contact a health care provider if:  Your condition does not improve with treatment.  Your condition gets worse.  You have signs of infection such as swelling, tenderness, redness, soreness, or warmth in the affected area.  You have a fever.  You have new symptoms. Get help right away if:  You have a severe headache, neck pain, or neck stiffness.  You vomit.  You feel very sleepy.  You notice red streaks coming from the affected area.  Your bone or joint underneath the affected area becomes painful after the skin has healed.  The affected area turns darker.  You have difficulty breathing. Summary  Dermatitis is redness, soreness, and swelling (inflammation) of the skin. Contact dermatitis is a reaction to certain substances that touch the skin.  Symptoms  of this condition may occur on your body anywhere the irritant has touched you or is touched by you.  This condition is treated by figuring out what caused the reaction and protecting your skin from further contact. Treatment may also include medicines and skin care.  Avoid the substance that caused your reaction. If you do not know what caused it, keep a journal to try to track what caused it.  Contact a health care provider if your condition gets worse or you have signs of infection such as swelling, tenderness, redness, soreness, or warmth in the affected area. This information is not intended to replace advice given to you by your health care provider. Make sure you discuss any questions you have with your health care provider. Document Released: 03/01/2000 Document Revised: 09/17/2017 Document Reviewed: 09/17/2017 Elsevier Interactive Patient Education  2019 [employer].

## 2018-05-27 ENCOUNTER — Other Ambulatory Visit: Payer: Self-pay | Admitting: Family Medicine

## 2018-05-27 DIAGNOSIS — E785 Hyperlipidemia, unspecified: Secondary | ICD-10-CM

## 2018-06-02 ENCOUNTER — Telehealth: Payer: Self-pay

## 2018-06-02 NOTE — Telephone Encounter (Signed)
Called pt to assess his needs. Pt states he is feeling well. His recent sx of dizziness has subsided when he began drinking water more frequently. He states his BP has been running in the 120's with one time in the low 100's. He denies needing a call from Dr Caryl Comes at this time or refills. He has agreed to postpone his appointment due to the COVID-19 outbreak. He understands we will be in contact him in the future to reschedule.

## 2018-06-09 ENCOUNTER — Other Ambulatory Visit: Payer: Self-pay

## 2018-06-09 ENCOUNTER — Telehealth (INDEPENDENT_AMBULATORY_CARE_PROVIDER_SITE_OTHER): Payer: Medicare HMO | Admitting: Family Medicine

## 2018-06-09 DIAGNOSIS — J3089 Other allergic rhinitis: Secondary | ICD-10-CM | POA: Insufficient documentation

## 2018-06-09 MED ORDER — FEXOFENADINE HCL 180 MG PO TABS: 180.0000 mg | ORAL_TABLET | Freq: Every day | ORAL | 1 refills | Status: DC

## 2018-06-09 MED ORDER — FLUTICASONE PROPIONATE 50 MCG/ACT NA SUSP: NASAL | 5 refills | Status: DC

## 2018-06-09 NOTE — Progress Notes (Signed)
Virtual Visit via Telephone Note  I connected with George [name] on 06/09/18 at  3:30 PM EDT by telephone and verified that I am speaking with the correct person using two identifiers.   I discussed the limitations, risks, security and privacy concerns of performing an evaluation and management service by telephone and the availability of in person appointments.   My staff also discussed with the patient that there may be a patient responsible charge related to this service.  The patient expressed understanding and agreed to proceed.    History of Present Illness: Saturday- 4 d ago- sx started as ST/ scratchy throat.   Yellowish d/c from nasal passage today.    No one sided face pain or ear.  Pt is not checking temps- wife George [name] is feeling forehead and she has told him does not appear abn, no cough, no SOB/ DIB/ wh.  - Didn't try anything.   ONce in a while takes a claritin.    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  He thinks this may have caused his symptoms.     Observations/Objective: - no idea- unable to check Patient sounds well but sounds a little nasally in like he has some sinus congestion   Assessment and Plan: Seasonal allergies:    -Prescribed Allegra along with Flonase. -Discussed importance with patient regarding him using some type of protective mask when he drills into the cement  -Doing sinus rinses twice daily as preventative is extremely important and after any prolonged exposure -Told patient to contact us if symptoms worsen, he develops fever chills and one-sided face pain.    I discussed the assessment and treatment plan with the patient. The patient was provided an opportunity to ask questions and all were answered. The patient agreed with the plan and demonstrated an understanding of the instructions.   The patient was advised to call back or seek an in-person evaluation if the  symptoms worsen or if the condition fails to improve as anticipated.   I provided 12.8 minutes of non-face-to-face time during this encounter.   Mellody Dance, DO

## 2018-06-10 ENCOUNTER — Ambulatory Visit: Payer: Medicare HMO | Admitting: Internal Medicine

## 2018-06-16 ENCOUNTER — Telehealth: Payer: Self-pay

## 2018-06-16 NOTE — Telephone Encounter (Signed)
Consent for virtual visit given over the phone; patient verbalized understanding and gave permission for a virtual visit and to bill them and their insurance company for an office visit. Patient does not have mychart.

## 2018-06-16 NOTE — Telephone Encounter (Signed)
I called and spoke with patient and his daughter, they have set up an appointment for tomorrow at 9. Advised them to send remote transmission this afternoon for appointment tomorrow morning. You will also need to text him the link for [email or web address].

## 2018-06-17 ENCOUNTER — Other Ambulatory Visit: Payer: Self-pay

## 2018-06-17 ENCOUNTER — Telehealth (INDEPENDENT_AMBULATORY_CARE_PROVIDER_SITE_OTHER): Payer: Medicare HMO | Admitting: Internal Medicine

## 2018-06-17 VITALS — BP 146/87 | HR 68 | Temp 97.4°F | Ht 68.0 in | Wt 159.0 lb

## 2018-06-17 DIAGNOSIS — I442 Atrioventricular block, complete: Secondary | ICD-10-CM | POA: Diagnosis not present

## 2018-06-17 DIAGNOSIS — I10 Essential (primary) hypertension: Secondary | ICD-10-CM | POA: Diagnosis not present

## 2018-06-17 DIAGNOSIS — I428 Other cardiomyopathies: Secondary | ICD-10-CM

## 2018-06-17 DIAGNOSIS — Z950 Presence of cardiac pacemaker: Secondary | ICD-10-CM | POA: Diagnosis not present

## 2018-06-17 DIAGNOSIS — E785 Hyperlipidemia, unspecified: Secondary | ICD-10-CM | POA: Diagnosis not present

## 2018-06-17 DIAGNOSIS — I493 Ventricular premature depolarization: Secondary | ICD-10-CM | POA: Diagnosis not present

## 2018-06-17 MED ORDER — LOSARTAN POTASSIUM 25 MG PO TABS: 25.0000 mg | ORAL_TABLET | Freq: Every day | ORAL | 3 refills | Status: DC

## 2018-06-17 MED ORDER — ATORVASTATIN CALCIUM 40 MG PO TABS: 40.0000 mg | ORAL_TABLET | Freq: Every day | ORAL | 3 refills | Status: DC

## 2018-06-17 NOTE — Progress Notes (Signed)
Electrophysiology TeleHealth Note   Due to national recommendations of social distancing due to COVID 19, an audio/video telehealth visit is felt to be most appropriate for this patient at this time.  See MyChart message from today for the patient's consent to telehealth for [facility].   Date:  06/17/2018   ID:  George [name], DOB May 08, 1950, MRN [id number]  Location: patient's home  Provider location: [address] [place]  Evaluation Performed: Follow-up visit  PCP:  Mellody Dance, DO  Cardiologist:  Virl Axe, MD   Electrophysiologist:  None   Chief Complaint:  CHF and pacemaker  History of Present Illness:    George [name] is a 68 y.o. male who presents via audio/video conferencing for a telehealth visit today.  Since last being seen in our clinic, the patient  denies   shortness of breath, nocturnal dyspnea, orthopnea or peripheral edema.  There have been no palpitations, lightheadedness or syncope.   Some chest soreness which he attributes to using hammer drill  Still working but staying well spaced      Followup for CRT-P Medtronic  implanted 8/17 for complete heart block, bilateral bundle branch block and cardiomyopathy.  Interval problems with orthostasis which is improved with hydration   .  DATE TEST EF   7/17 Echo   45-50 %   11/18 Echo   55 %   11/19 LHC 40 % Nonobst CAD   Date Cr K Hgb  1/20 0.82 4.2 13.7            The patient denies symptoms of fevers, chills, cough, or new SOB worrisome for COVID 19.    Past Medical History:  Diagnosis Date  . Arthritis THUMBS  . LBBB (left bundle branch block)   . Peyronie's disease     Past Surgical History:  Procedure Laterality Date  . CARDIOVASCULAR STRESS TEST  10-18-2002  DR  CRENSHAW   NORMAL ADENOSINE CARDIOLITE/ EF 60%  . CATARACT EXTRACTION W/ INTRAOCULAR LENS IMPLANT  2006   LEFT EYE  . EP IMPLANTABLE DEVICE N/A 10/18/2015   Procedure: BiV Pacemaker Insertion  CRT-P;  Surgeon: [name], MD;  Location: [place] [facility];  Service: Cardiovascular;  Laterality: N/A;  . LAPAROSCOPIC CHOLECYSTECTOMY  10-09-2007  . LEFT HEART CATH AND CORONARY ANGIOGRAPHY N/A 01/30/2018   Procedure: LEFT HEART CATH AND CORONARY ANGIOGRAPHY;  Surgeon: Belva Crome, MD;  Location: [place] [facility];  Service: Cardiovascular;  Laterality: N/A;  . NESBIT PROCEDURE  01/17/2012   Procedure: NESBIT PROCEDURE;  Surgeon: [name], MD;  Location[facility];  Service: Urology;  Laterality: N/A;  16 dot plication   . [place], LEFT EYE  1993  . REPAIR LEFT INGUINAL HERNIA  2005    Current Outpatient Medications  Medication Sig Dispense Refill  . atorvastatin (LIPITOR) 40 MG tablet Take 1 tablet (40 mg total) by mouth daily at 6 PM. 90 tablet 3  . carvedilol (COREG) 25 MG tablet Take 1 tablet (25 mg total) by mouth 2 (two) times daily with a meal. 180 tablet 2  . Cholecalciferol (VITAMIN D3) 5000 units TABS 5,000 IU OTC vitamin D3 daily. 90 tablet 3  . clotrimazole-betamethasone (LOTRISONE) cream Apply 1 application topically 2 (two) times daily. 45 g 0  . fexofenadine (ALLEGRA) 180 MG tablet Take 1 tablet (180 mg total) by mouth daily. During allergy season 90 tablet 1  . fluticasone (FLONASE) 50 MCG/ACT  nasal spray 1 spray each nostril AFTER sinus rinses twice daily 16 g 5  . hydrocortisone cream 1 % Apply 1 application topically 2 (two) times daily. Apply to eyelids bilaterally 30 g 0  . losartan (COZAAR) 25 MG tablet Take 1 tablet (25 mg total) by mouth daily. 90 tablet 3  . Magnesium 250 MG TABS Take 250 mg by mouth daily.    . sildenafil (REVATIO) 20 MG tablet Take 20-100 mg by mouth daily as needed.    . vitamin B-12 (CYANOCOBALAMIN) 1000 MCG tablet Take 1,000 mcg by mouth daily.     No current facility-administered medications for this visit.     Allergies:   Patient has no known allergies.   Social History:  The patient   reports that he has never smoked. He has never used smokeless tobacco. He reports that he does not drink alcohol or use drugs.   Family History:  The patient's   family history includes Arrhythmia in his father; Diabetes in his mother; Healthy in his daughter and son; Heart attack in his paternal grandfather; Heart disease in his father and paternal grandfather; Hyperlipidemia in his father and mother; Hypertension in his father and mother.   ROS:  Please see the history of present illness.   All other systems are personally reviewed and negative.    Exam:    Vital Signs:  BP (!) 146/87   Pulse 68   Temp (!) 97.4 F (36.3 C)   Ht 5\' 8"  (1.727 m)   Wt 159 lb (72.1 kg)   BMI 24.18 kg/m     Well appearing, alert and conversant, regular work of breathing,  good skin color Eyes- anicteric, neuro- grossly intact, skin- no apparent rash or lesions or cyanosis, mouth- oral mucosa is pink   Labs/Other Tests and Data Reviewed:    Recent Labs: 08/15/2017: ALT 24 01/21/2018: Magnesium 2.0 01/23/2018: Hemoglobin 13.7; Platelets 196 03/25/2018: BUN 13; Creatinine, Ser 0.82; Potassium 4.2; Sodium 139   Wt Readings from Last 3 Encounters:  06/17/18 159 lb (72.1 kg)  05/26/18 166 lb 9.6 oz (75.6 kg)  03/03/18 161 lb 12.8 oz (73.4 kg)     Other studies personally reviewed: Additional studies/ records that were reviewed today include:    Last device remote is reviewed from [facility] PDF dated 2/20 which reveals normal device function,   arrhythmias --SCAF and PVCs >96% CRT effective   ASSESSMENT & PLAN:     High grade heart block  Sinus node function  PVC  CRT-P Medtronic   Hypertension  Atrial Tach  Cardiomyopathy non ischemic  Orthostatic lightheadedness  By symptoms and report, Euvolemic continue current meds   Device function reviewed  W orthostasis and am BP elevation, will have him take his losartan at bedtime- also suggested thigh sleeves   No atrial tach    COVID 19 screen The patient denies symptoms of COVID 19 at this time.  The importance of social distancing was discussed today.  Follow-up:  62m Next remote: 18m  Current medicines are reviewed at length with the patient today.   The patient does not have concerns regarding his medicines.  The following changes were made today:  As above   Labs/ tests ordered today include:   No orders of the defined types were placed in this encounter.   Future tests ( post COVID )  bmet in 3  months  Patient Risk:  after full review of this patients clinical status, I feel that they are  at moderate risk at this time.  Today, I have spent 25 minutes with the patient with telehealth technology discussing As above    Signed, Virl Axe, MD  06/17/2018 9:09 AM     [facility] [address] [place] [address] [phone] (office) [phone] (fax)

## 2018-06-23 ENCOUNTER — Other Ambulatory Visit: Payer: Self-pay

## 2018-06-23 ENCOUNTER — Encounter: Payer: Self-pay | Admitting: Family Medicine

## 2018-06-23 ENCOUNTER — Ambulatory Visit (INDEPENDENT_AMBULATORY_CARE_PROVIDER_SITE_OTHER): Payer: Medicare HMO | Admitting: Family Medicine

## 2018-06-23 VITALS — BP 144/84 | HR 57 | Temp 97.0°F | Ht 68.0 in | Wt 162.0 lb

## 2018-06-23 DIAGNOSIS — E785 Hyperlipidemia, unspecified: Secondary | ICD-10-CM | POA: Diagnosis not present

## 2018-06-23 DIAGNOSIS — E538 Deficiency of other specified B group vitamins: Secondary | ICD-10-CM | POA: Diagnosis not present

## 2018-06-23 DIAGNOSIS — I119 Hypertensive heart disease without heart failure: Secondary | ICD-10-CM

## 2018-06-23 DIAGNOSIS — E559 Vitamin D deficiency, unspecified: Secondary | ICD-10-CM

## 2018-06-23 DIAGNOSIS — I10 Essential (primary) hypertension: Secondary | ICD-10-CM

## 2018-06-23 DIAGNOSIS — I2510 Atherosclerotic heart disease of native coronary artery without angina pectoris: Secondary | ICD-10-CM

## 2018-06-23 DIAGNOSIS — R5383 Other fatigue: Secondary | ICD-10-CM

## 2018-06-23 NOTE — Progress Notes (Signed)
Virtual Visit via Telephone Note for [employer], D.O- Primary Care Physician at [facility]   I connected with current patient today by telephone and verified that I am speaking with the correct person using two identifiers.   Because of federal recommendations of social distancing due to the current novel COVID-19 outbreak, an audio/video telehealth visit is felt to be most appropriate for this patient at this time.  My staff members also discussed with the patient that there may be a patient charge related to this service.   The patient expressed understanding, and agreed to proceed.    History of Present Illness:     Seasonal allergies/URI symptoms improving sx of URI with using Flonase and OTC claritin-type pill  - throat not as scratchy; only first thing in AM   Hypertension: Dr Caryl Comes- had pt change losartan to take at night. - ON April 1st.   Orthostatic symptoms have improved with this change.    Home BP- 130's/ 80's.  Patient has no complaints or concerns.  - Patient reports good compliance with blood pressure medications  - Denies medication S-E   - Smoking Status noted   - He denies new onset of: chest pain, exercise intolerance, shortness of breath, dizziness, visual changes, headache, lower extremity swelling or claudication.   Today their BP is BP: (!) 144/84   Last 3 blood pressure readings in our office are as follows: BP Readings from Last 3 Encounters:  06/23/18 (!) 144/84  06/17/18 (!) 146/87  05/26/18 136/78     Coronary artery disease/hyperlipidemia: Patient admits he and his wife have not been that great with eating/healthy habits.  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  He is taking his meds as prescribed without any difficulty.  CHOL HPI:  -  He  is currently managed with:  See med list from today  Txmnt compliance- good  Patient reports very little compliance with low chol/ saturated and trans fat diet.  No  exercise  RUQ pain- no  Muscle aches- no, no more so than normal.  No other s-e  Last lipid panel as follows:  Lab Results  Component Value Date   CHOL 120 08/15/2017   HDL 48 08/15/2017   LDLCALC 58 08/15/2017   TRIG 69 08/15/2017   CHOLHDL 2.5 08/15/2017    Hepatic Function Latest Ref Rng & Units 08/15/2017 11/13/2016 01/01/2016  Total Protein 6.0 - 8.5 g/dL 6.8 7.0 7.1  Albumin 3.6 - 4.8 g/dL 4.2 4.1 3.9  AST 0 - 40 IU/L 28 20 25   ALT 0 - 44 IU/L 24 16 15(L)  Alk Phosphatase 39 - 117 IU/L 91 94 82  Total Bilirubin 0.0 - 1.2 mg/dL 1.8(H) 0.9 1.1       Wt Readings from Last 3 Encounters:  06/23/18 162 lb (73.5 kg)  06/17/18 159 lb (72.1 kg)  05/26/18 166 lb 9.6 oz (75.6 kg)    BP Readings from Last 3 Encounters:  06/23/18 (!) 144/84  06/17/18 (!) 146/87  05/26/18 136/78    Pulse Readings from Last 3 Encounters:  06/23/18 (!) 57  06/17/18 68  05/26/18 62    BMI Readings from Last 3 Encounters:  06/23/18 24.63 kg/m  06/17/18 24.18 kg/m  05/26/18 25.33 kg/m    -Vitals obtained; Medications, allergies reconciled;  personal medical, social, Sx etc. etc. histories were updated by Lanier Prude the medical assistant today and are reflected in below chart  Patient Care Team    Relationship Specialty Notifications Start End  Mellody Dance, DO PCP - General Family Medicine  09/14/15   [name], MD PCP - Cardiology Cardiology Admissions 12/26/17   Wonda Horner, MD Consulting Physician Gastroenterology  11/12/16   Allyn Kenner, MD Consulting Physician Dermatology  11/13/16   Pa, [place] Physician Optometry  11/13/16   [name], MD Consulting Physician Cardiology  05/13/17    Comment: PM placement and EP     Patient Active Problem List   Diagnosis Date Noted  . Hyperlipidemia LDL goal <100 05/13/2017    Priority: High  . Environmental and seasonal allergies 06/09/2018  . Senile solar keratosis 05/26/2018  . Contact  dermatitis 05/26/2018  . Eczema of eyelid 05/26/2018  . Inguinal hernia of right side without obstruction or gangrene 05/26/2018  . CAD (coronary artery disease), native coronary artery 01/30/2018  . Coronary artery aneurysm 01/30/2018  . VT (ventricular tachycardia) ([place])   . Other male erectile dysfunction- "hx of fractured penis requiring sx" 05/20/2017  . Dizziness 08/21/2016  . BBBB (bilateral bundle branch block) 10/18/2015  . Complete heart block ([place]) 10/18/2015  . Vitamin B12 deficiency 10/06/2015  . Vitamin D insufficiency 10/06/2015  . Bradycardia 10/06/2015  . Hereditary hyperbilirubinemia 10/06/2015  . H/O noncompliance with medical treatment, presenting hazards to health 09/19/2015  . Peyronie's disease 09/19/2015  . DOE (dyspnea on exertion) 09/19/2015  . Unexplained weight loss 09/14/2015  . Essential hypertension 09/14/2015  . h/o Non-compliance: Has not seen a doc in over 13 years 09/14/2015  . Fatigue 09/14/2015     Current Meds  Medication Sig  . atorvastatin (LIPITOR) 40 MG tablet Take 1 tablet (40 mg total) by mouth daily at 6 PM.  . carvedilol (COREG) 25 MG tablet Take 1 tablet (25 mg total) by mouth 2 (two) times daily with a meal.  . Cholecalciferol (VITAMIN D3) 5000 units TABS 5,000 IU OTC vitamin D3 daily.  . clotrimazole-betamethasone (LOTRISONE) cream Apply 1 application topically 2 (two) times daily.  . fexofenadine (ALLEGRA) 180 MG tablet Take 1 tablet (180 mg total) by mouth daily. During allergy season  . fluticasone (FLONASE) 50 MCG/ACT nasal spray 1 spray each nostril AFTER sinus rinses twice daily  . hydrocortisone cream 1 % Apply 1 application topically 2 (two) times daily. Apply to eyelids bilaterally  . losartan (COZAAR) 25 MG tablet Take 1 tablet (25 mg total) by mouth at bedtime.  . Magnesium 250 MG TABS Take 250 mg by mouth daily.  . sildenafil (REVATIO) 20 MG tablet Take 20-100 mg by mouth daily as needed.  . vitamin B-12 (CYANOCOBALAMIN)  1000 MCG tablet Take 1,000 mcg by mouth daily.     Allergies:  No Known Allergies   ROS:  See above HPI for pertinent positives and negatives   Objective:   Blood pressure (!) 144/84, pulse (!) 57, temperature (!) 97 F (36.1 C), height 5' 8"  (1.727 m), weight 162 lb (73.5 kg). General: sounds in no acute distress.  Skin: Pt confirms warm and dry  extremities and pink fingertips Respiratory: speaking in full sentences, no conversational dyspnea Psych: A and O *3, appears insight good, mood- full      Impression and Recommendations:    Problem List Items Addressed This Visit      High   Hyperlipidemia LDL goal <100   Relevant Orders   CBC with Differential/Platelet   Comprehensive metabolic panel   Hemoglobin A1c  Lipid panel   T4, free   TSH   VITAMIN D 25 Hydroxy (Vit-D Deficiency, Fractures)   B12   Magnesium     Unprioritized   CAD (coronary artery disease), native coronary artery   Relevant Orders   CBC with Differential/Platelet   Comprehensive metabolic panel   Hemoglobin A1c   Lipid panel   T4, free   TSH   VITAMIN D 25 Hydroxy (Vit-D Deficiency, Fractures)   B12   Magnesium   Essential hypertension - Primary (Chronic)   Relevant Orders   CBC with Differential/Platelet   Comprehensive metabolic panel   Hemoglobin A1c   Lipid panel   T4, free   TSH   VITAMIN D 25 Hydroxy (Vit-D Deficiency, Fractures)   B12   Magnesium   Fatigue   Relevant Orders   CBC with Differential/Platelet   Comprehensive metabolic panel   Hemoglobin A1c   Lipid panel   T4, free   TSH   VITAMIN D 25 Hydroxy (Vit-D Deficiency, Fractures)   B12   Magnesium   Hereditary hyperbilirubinemia (Chronic)   Relevant Orders   CBC with Differential/Platelet   Comprehensive metabolic panel   Hemoglobin A1c   Lipid panel   T4, free   TSH   VITAMIN D 25 Hydroxy (Vit-D Deficiency, Fractures)   B12   Magnesium   Vitamin B12 deficiency (Chronic)   Relevant Orders   CBC  with Differential/Platelet   Comprehensive metabolic panel   Hemoglobin A1c   Lipid panel   T4, free   TSH   VITAMIN D 25 Hydroxy (Vit-D Deficiency, Fractures)   B12   Magnesium   Vitamin D insufficiency (Chronic)   Relevant Orders   CBC with Differential/Platelet   Comprehensive metabolic panel   Hemoglobin A1c   Lipid panel   T4, free   TSH   VITAMIN D 25 Hydroxy (Vit-D Deficiency, Fractures)   B12   Magnesium       - end of May 2020- come in for full set of bldwrk including B12 and magnesium- we can send results to his Card doc Caryl Comes - asked CMA to place - drink adequate amnts water;  - monitor home BP - call if not at goal of less than 140/90 on routine basis. - prudent diet for CHOl d/c pt - d/c pt s-e of B-Bl and how can exaccerbate orthostatic sx if not hydrated etc.    -Discussed with patient strategies to help with his symptoms assoc with seasonal allergies.   - Novel Covid -19 counseling done; all questions were answered.   - Current CDC and federal guidelines reviewed with patient  - Reminded pt of extreme importance of social distancing; minimizing contacts with others, avoiding ALL but emergency appts etc. - Told patient to be prepared, not scared; and be smart for the sake of others - told to call with any concerns    I discussed the assessment and treatment plan with the patient. The patient was provided an opportunity to ask questions and all were answered.  - The patient agreed with the plan and demonstrated an understanding of the instructions.   No barriers to understanding were identified.  Red flag symptoms and signs discussed in detail.  Patient expressed understanding regarding what to do in case of emergency\urgent symptoms   The patient was advised to call back or seek an in-person evaluation if the symptoms worsen or if the condition fails to improve as anticipated.   Return for 41mof/up BP, discuss  labs etc. FBW end of May- lab only.     Orders Placed This Encounter  Procedures  . CBC with Differential/Platelet  . Comprehensive metabolic panel  . Hemoglobin A1c  . Lipid panel  . T4, free  . TSH  . VITAMIN D 25 Hydroxy (Vit-D Deficiency, Fractures)  . B12  . Magnesium   Gross side effects, risk and benefits, and alternatives of medications and treatment plan in general discussed with patient.  Patient is aware that all medications have potential side effects and we are unable to predict every side effect or drug-drug interaction that may occur.   Patient was strongly encouraged to call with any questions or concerns they may have concerns.     I provided 18+ minutes of non-face-to-face time during this encounter.   Mellody Dance, DO

## 2018-07-29 ENCOUNTER — Other Ambulatory Visit: Payer: Self-pay

## 2018-07-29 ENCOUNTER — Ambulatory Visit (INDEPENDENT_AMBULATORY_CARE_PROVIDER_SITE_OTHER): Payer: Medicare HMO | Admitting: *Deleted

## 2018-07-29 DIAGNOSIS — I442 Atrioventricular block, complete: Secondary | ICD-10-CM

## 2018-07-29 DIAGNOSIS — I428 Other cardiomyopathies: Secondary | ICD-10-CM

## 2018-07-29 LAB — CUP PACEART REMOTE DEVICE CHECK
Battery Remaining Longevity: 70 mo
Battery Voltage: 2.98 V
Brady Statistic AP VP Percent: 35.87 %
Brady Statistic AP VS Percent: 0.08 %
Brady Statistic AS VP Percent: 62.55 %
Brady Statistic AS VS Percent: 1.5 %
Brady Statistic RA Percent Paced: 37.07 %
Brady Statistic RV Percent Paced: 98.42 %
Date Time Interrogation Session: 20200513125904
Implantable Lead Implant Date: 20170802
Implantable Lead Implant Date: 20170802
Implantable Lead Implant Date: 20170802
Implantable Lead Location: 753858
Implantable Lead Location: 753859
Implantable Lead Location: 753860
Implantable Lead Model: 5076
Implantable Lead Model: 5076
Implantable Pulse Generator Implant Date: 20170802
Lead Channel Impedance Value: 1178 Ohm
Lead Channel Impedance Value: 1197 Ohm
Lead Channel Impedance Value: 1235 Ohm
Lead Channel Impedance Value: 323 Ohm
Lead Channel Impedance Value: 380 Ohm
Lead Channel Impedance Value: 437 Ohm
Lead Channel Impedance Value: 513 Ohm
Lead Channel Impedance Value: 513 Ohm
Lead Channel Impedance Value: 532 Ohm
Lead Channel Impedance Value: 589 Ohm
Lead Channel Impedance Value: 798 Ohm
Lead Channel Impedance Value: 874 Ohm
Lead Channel Impedance Value: 912 Ohm
Lead Channel Impedance Value: 988 Ohm
Lead Channel Pacing Threshold Amplitude: 0.75 V
Lead Channel Pacing Threshold Amplitude: 0.875 V
Lead Channel Pacing Threshold Amplitude: 2.25 V
Lead Channel Pacing Threshold Pulse Width: 0.4 ms
Lead Channel Pacing Threshold Pulse Width: 0.4 ms
Lead Channel Pacing Threshold Pulse Width: 0.8 ms
Lead Channel Sensing Intrinsic Amplitude: 1.875 mV
Lead Channel Sensing Intrinsic Amplitude: 1.875 mV
Lead Channel Sensing Intrinsic Amplitude: 8.125 mV
Lead Channel Sensing Intrinsic Amplitude: 8.125 mV
Lead Channel Setting Pacing Amplitude: 1.75 V
Lead Channel Setting Pacing Amplitude: 2.5 V
Lead Channel Setting Pacing Amplitude: 3.75 V
Lead Channel Setting Pacing Pulse Width: 0.4 ms
Lead Channel Setting Pacing Pulse Width: 0.8 ms
Lead Channel Setting Sensing Sensitivity: 2.8 mV

## 2018-08-14 NOTE — Progress Notes (Signed)
Remote pacemaker transmission.   

## 2018-09-16 ENCOUNTER — Other Ambulatory Visit: Payer: Medicare HMO

## 2018-09-24 ENCOUNTER — Other Ambulatory Visit: Payer: Self-pay

## 2018-09-24 ENCOUNTER — Other Ambulatory Visit (INDEPENDENT_AMBULATORY_CARE_PROVIDER_SITE_OTHER): Payer: Medicare HMO

## 2018-09-24 DIAGNOSIS — E559 Vitamin D deficiency, unspecified: Secondary | ICD-10-CM | POA: Diagnosis not present

## 2018-09-24 DIAGNOSIS — E538 Deficiency of other specified B group vitamins: Secondary | ICD-10-CM | POA: Diagnosis not present

## 2018-09-24 DIAGNOSIS — I10 Essential (primary) hypertension: Secondary | ICD-10-CM | POA: Diagnosis not present

## 2018-09-24 DIAGNOSIS — E785 Hyperlipidemia, unspecified: Secondary | ICD-10-CM | POA: Diagnosis not present

## 2018-09-24 DIAGNOSIS — R5383 Other fatigue: Secondary | ICD-10-CM | POA: Diagnosis not present

## 2018-09-24 DIAGNOSIS — I2510 Atherosclerotic heart disease of native coronary artery without angina pectoris: Secondary | ICD-10-CM | POA: Diagnosis not present

## 2018-09-25 LAB — CBC WITH DIFFERENTIAL/PLATELET
Basophils Absolute: 0 10*3/uL (ref 0.0–0.2)
Basos: 1 %
EOS (ABSOLUTE): 0.2 10*3/uL (ref 0.0–0.4)
Eos: 3 %
Hematocrit: 38.9 % (ref 37.5–51.0)
Hemoglobin: 13.6 g/dL (ref 13.0–17.7)
Immature Grans (Abs): 0 10*3/uL (ref 0.0–0.1)
Immature Granulocytes: 0 %
Lymphocytes Absolute: 2.6 10*3/uL (ref 0.7–3.1)
Lymphs: 42 %
MCH: 31.3 pg (ref 26.6–33.0)
MCHC: 35 g/dL (ref 31.5–35.7)
MCV: 89 fL (ref 79–97)
Monocytes Absolute: 0.5 10*3/uL (ref 0.1–0.9)
Monocytes: 8 %
Neutrophils Absolute: 2.8 10*3/uL (ref 1.4–7.0)
Neutrophils: 46 %
Platelets: 173 10*3/uL (ref 150–450)
RBC: 4.35 x10E6/uL (ref 4.14–5.80)
RDW: 12.3 % (ref 11.6–15.4)
WBC: 6.1 10*3/uL (ref 3.4–10.8)

## 2018-09-25 LAB — COMPREHENSIVE METABOLIC PANEL
ALT: 21 IU/L (ref 0–44)
AST: 25 IU/L (ref 0–40)
Albumin/Globulin Ratio: 1.9 (ref 1.2–2.2)
Albumin: 4.3 g/dL (ref 3.8–4.8)
Alkaline Phosphatase: 82 IU/L (ref 39–117)
BUN/Creatinine Ratio: 14 (ref 10–24)
BUN: 12 mg/dL (ref 8–27)
Bilirubin Total: 2 mg/dL — ABNORMAL HIGH (ref 0.0–1.2)
CO2: 27 mmol/L (ref 20–29)
Calcium: 9.3 mg/dL (ref 8.6–10.2)
Chloride: 105 mmol/L (ref 96–106)
Creatinine, Ser: 0.88 mg/dL (ref 0.76–1.27)
GFR calc Af Amer: 104 mL/min/{1.73_m2} (ref >59)
GFR calc non Af Amer: 90 mL/min/{1.73_m2} (ref >59)
Globulin, Total: 2.3 g/dL (ref 1.5–4.5)
Glucose: 107 mg/dL — ABNORMAL HIGH (ref 65–99)
Potassium: 4.7 mmol/L (ref 3.5–5.2)
Sodium: 142 mmol/L (ref 134–144)
Total Protein: 6.6 g/dL (ref 6.0–8.5)

## 2018-09-25 LAB — T4, FREE: Free T4: 1.2 ng/dL (ref 0.82–1.77)

## 2018-09-25 LAB — MAGNESIUM: Magnesium: 2.1 mg/dL (ref 1.6–2.3)

## 2018-09-25 LAB — TSH: TSH: 1.11 u[IU]/mL (ref 0.450–4.500)

## 2018-09-25 LAB — LIPID PANEL
Chol/HDL Ratio: 2.1 ratio (ref 0.0–5.0)
Cholesterol, Total: 126 mg/dL (ref 100–199)
HDL: 60 mg/dL (ref >39)
LDL Calculated: 52 mg/dL (ref 0–99)
Triglycerides: 68 mg/dL (ref 0–149)
VLDL Cholesterol Cal: 14 mg/dL (ref 5–40)

## 2018-09-25 LAB — VITAMIN B12: Vitamin B-12: 490 pg/mL (ref 232–1245)

## 2018-09-25 LAB — VITAMIN D 25 HYDROXY (VIT D DEFICIENCY, FRACTURES): Vit D, 25-Hydroxy: 59.6 ng/mL (ref 30.0–100.0)

## 2018-09-25 LAB — HEMOGLOBIN A1C
Est. average glucose Bld gHb Est-mCnc: 111 mg/dL
Hgb A1c MFr Bld: 5.5 % (ref 4.8–5.6)

## 2018-10-20 ENCOUNTER — Other Ambulatory Visit: Payer: Self-pay | Admitting: Physician Assistant

## 2018-10-27 ENCOUNTER — Ambulatory Visit (INDEPENDENT_AMBULATORY_CARE_PROVIDER_SITE_OTHER): Payer: Medicare HMO | Admitting: *Deleted

## 2018-10-27 DIAGNOSIS — I472 Ventricular tachycardia, unspecified: Secondary | ICD-10-CM

## 2018-10-27 DIAGNOSIS — I442 Atrioventricular block, complete: Secondary | ICD-10-CM

## 2018-10-27 LAB — CUP PACEART REMOTE DEVICE CHECK
Battery Remaining Longevity: 75 mo
Battery Voltage: 2.97 V
Brady Statistic AP VP Percent: 43.79 %
Brady Statistic AP VS Percent: 0.06 %
Brady Statistic AS VP Percent: 55.05 %
Brady Statistic AS VS Percent: 1.1 %
Brady Statistic RA Percent Paced: 44.57 %
Brady Statistic RV Percent Paced: 98.83 %
Date Time Interrogation Session: 20200811122720
Implantable Lead Implant Date: 20170802
Implantable Lead Implant Date: 20170802
Implantable Lead Implant Date: 20170802
Implantable Lead Location: 753858
Implantable Lead Location: 753859
Implantable Lead Location: 753860
Implantable Lead Model: 5076
Implantable Lead Model: 5076
Implantable Pulse Generator Implant Date: 20170802
Lead Channel Impedance Value: 1045 Ohm
Lead Channel Impedance Value: 1159 Ohm
Lead Channel Impedance Value: 1178 Ohm
Lead Channel Impedance Value: 323 Ohm
Lead Channel Impedance Value: 399 Ohm
Lead Channel Impedance Value: 399 Ohm
Lead Channel Impedance Value: 475 Ohm
Lead Channel Impedance Value: 532 Ohm
Lead Channel Impedance Value: 532 Ohm
Lead Channel Impedance Value: 551 Ohm
Lead Channel Impedance Value: 760 Ohm
Lead Channel Impedance Value: 760 Ohm
Lead Channel Impedance Value: 798 Ohm
Lead Channel Impedance Value: 931 Ohm
Lead Channel Pacing Threshold Amplitude: 0.75 V
Lead Channel Pacing Threshold Amplitude: 0.875 V
Lead Channel Pacing Threshold Amplitude: 2 V
Lead Channel Pacing Threshold Pulse Width: 0.4 ms
Lead Channel Pacing Threshold Pulse Width: 0.4 ms
Lead Channel Pacing Threshold Pulse Width: 0.8 ms
Lead Channel Sensing Intrinsic Amplitude: 2 mV
Lead Channel Sensing Intrinsic Amplitude: 2 mV
Lead Channel Sensing Intrinsic Amplitude: 8.125 mV
Lead Channel Sensing Intrinsic Amplitude: 8.125 mV
Lead Channel Setting Pacing Amplitude: 1.75 V
Lead Channel Setting Pacing Amplitude: 2.5 V
Lead Channel Setting Pacing Amplitude: 3.25 V
Lead Channel Setting Pacing Pulse Width: 0.4 ms
Lead Channel Setting Pacing Pulse Width: 0.8 ms
Lead Channel Setting Sensing Sensitivity: 2.8 mV

## 2018-11-04 NOTE — Progress Notes (Signed)
Remote pacemaker transmission.   

## 2018-11-12 ENCOUNTER — Other Ambulatory Visit: Payer: Self-pay

## 2018-11-12 ENCOUNTER — Ambulatory Visit (INDEPENDENT_AMBULATORY_CARE_PROVIDER_SITE_OTHER): Payer: Medicare HMO

## 2018-11-12 ENCOUNTER — Ambulatory Visit: Payer: Medicare HMO | Admitting: Podiatry

## 2018-11-12 ENCOUNTER — Encounter: Payer: Self-pay | Admitting: Podiatry

## 2018-11-12 VITALS — BP 135/78

## 2018-11-12 DIAGNOSIS — M2041 Other hammer toe(s) (acquired), right foot: Secondary | ICD-10-CM

## 2018-11-12 DIAGNOSIS — M25511 Pain in right shoulder: Secondary | ICD-10-CM | POA: Diagnosis not present

## 2018-11-12 DIAGNOSIS — M79671 Pain in right foot: Secondary | ICD-10-CM

## 2018-11-12 DIAGNOSIS — G629 Polyneuropathy, unspecified: Secondary | ICD-10-CM | POA: Diagnosis not present

## 2018-11-12 DIAGNOSIS — I472 Ventricular tachycardia, unspecified: Secondary | ICD-10-CM

## 2018-11-12 DIAGNOSIS — M2042 Other hammer toe(s) (acquired), left foot: Secondary | ICD-10-CM

## 2018-11-12 DIAGNOSIS — M25612 Stiffness of left shoulder, not elsewhere classified: Secondary | ICD-10-CM | POA: Diagnosis not present

## 2018-11-12 DIAGNOSIS — G8929 Other chronic pain: Secondary | ICD-10-CM | POA: Diagnosis not present

## 2018-11-13 NOTE — Progress Notes (Signed)
Subjective:   Patient ID: George [name], male   DOB: 68 y.o.   MRN: [id number]   HPI Patient presents stating he feels a strange sensation in the bottom of his feet with the right being worse and it seemed to occur years ago and he did have significant trauma to the foot about 10 years ago.  He does not have current pain but feels a knot-like sensation and patient does not smoke likes to be active   Review of Systems  All other systems reviewed and are negative.       Objective:  Physical Exam Vitals signs and nursing note reviewed.  Constitutional:      Appearance: He is well-developed.  Pulmonary:     Effort: Pulmonary effort is normal.  Musculoskeletal: Normal range of motion.  Skin:    General: Skin is warm.  Neurological:     Mental Status: He is alert.     Neurovascular status found to be intact with muscle strength found to be adequate range of motion moderately diminished.  Patient has mild discomfort in the metatarsal phalangeal joints but no indications currently of lack of sensation with sharp dull vibratory intact currently     Assessment:  Appears to be dealing with some form of midlevel neuropathy with patient who does not take very good care of himself or follow medical advice     Plan:  H&P extensive education to patient rendered consisting of neuropathy and we discussed balance issues or other pathology which may require a more aggressive treatment plan.  At this time we are just going to put him on a watch and see how he does and decide if anything else will be appropriate from a treatment standpoint and encouraged him to work on balance exercises currently  X-rays indicate no indications that there is arthritis in the lesser MPJs or other indication of bony pathology associated with his condition

## 2018-11-16 ENCOUNTER — Other Ambulatory Visit: Payer: Self-pay | Admitting: Podiatry

## 2018-11-16 DIAGNOSIS — M2042 Other hammer toe(s) (acquired), left foot: Secondary | ICD-10-CM

## 2018-11-16 DIAGNOSIS — M2041 Other hammer toe(s) (acquired), right foot: Secondary | ICD-10-CM

## 2018-11-18 ENCOUNTER — Other Ambulatory Visit: Payer: Self-pay | Admitting: Orthopedic Surgery

## 2018-11-28 ENCOUNTER — Other Ambulatory Visit: Payer: Self-pay | Admitting: Family Medicine

## 2018-11-28 DIAGNOSIS — J3089 Other allergic rhinitis: Secondary | ICD-10-CM

## 2018-12-23 DIAGNOSIS — H2511 Age-related nuclear cataract, right eye: Secondary | ICD-10-CM | POA: Diagnosis not present

## 2018-12-31 ENCOUNTER — Other Ambulatory Visit: Payer: Self-pay | Admitting: Orthopedic Surgery

## 2018-12-31 DIAGNOSIS — R531 Weakness: Secondary | ICD-10-CM

## 2018-12-31 DIAGNOSIS — M25511 Pain in right shoulder: Secondary | ICD-10-CM

## 2019-01-08 ENCOUNTER — Other Ambulatory Visit: Payer: Self-pay

## 2019-01-08 ENCOUNTER — Ambulatory Visit
Admission: RE | Admit: 2019-01-08 | Discharge: 2019-01-08 | Disposition: A | Discharge status: Home / Self Care | Payer: Medicare HMO | Source: Ambulatory Visit | Attending: Orthopedic Surgery | Admitting: Orthopedic Surgery

## 2019-01-08 DIAGNOSIS — M75111 Incomplete rotator cuff tear or rupture of right shoulder, not specified as traumatic: Secondary | ICD-10-CM | POA: Diagnosis not present

## 2019-01-08 DIAGNOSIS — M25511 Pain in right shoulder: Secondary | ICD-10-CM

## 2019-01-08 DIAGNOSIS — R531 Weakness: Secondary | ICD-10-CM

## 2019-01-08 MED ORDER — IOPAMIDOL (ISOVUE-M 200) INJECTION 41%
20.0000 mL | Freq: Once | INTRAMUSCULAR | Status: AC
  Administered 2019-01-08: 20 mL via INTRA_ARTICULAR

## 2019-01-11 ENCOUNTER — Telehealth: Payer: Self-pay | Admitting: Family Medicine

## 2019-01-11 DIAGNOSIS — M25511 Pain in right shoulder: Secondary | ICD-10-CM | POA: Diagnosis not present

## 2019-01-11 DIAGNOSIS — M19011 Primary osteoarthritis, right shoulder: Secondary | ICD-10-CM | POA: Diagnosis not present

## 2019-01-11 DIAGNOSIS — G8929 Other chronic pain: Secondary | ICD-10-CM | POA: Diagnosis not present

## 2019-01-11 DIAGNOSIS — M7541 Impingement syndrome of right shoulder: Secondary | ICD-10-CM | POA: Diagnosis not present

## 2019-01-11 NOTE — Telephone Encounter (Signed)
Pt's wife states they want Dr. Hershal Coria advice concerning pt's needs for hernia repair--They already have an idea of who they want to use but would like provider's advice( input as well).  ---Forwarding message to medical assistant for review w/ provider & to contact Patient @ [phone].  --glh

## 2019-01-12 NOTE — Telephone Encounter (Signed)
Please contact patient to schedule office visit to discuss hernia repair. AS, CMA

## 2019-01-14 ENCOUNTER — Encounter: Payer: Self-pay | Admitting: Family Medicine

## 2019-01-14 ENCOUNTER — Ambulatory Visit (INDEPENDENT_AMBULATORY_CARE_PROVIDER_SITE_OTHER): Payer: Medicare HMO | Admitting: Family Medicine

## 2019-01-14 ENCOUNTER — Other Ambulatory Visit: Payer: Self-pay

## 2019-01-14 VITALS — BP 146/80 | HR 66 | Temp 97.0°F | Ht 68.0 in | Wt 166.0 lb

## 2019-01-14 DIAGNOSIS — I452 Bifascicular block: Secondary | ICD-10-CM | POA: Diagnosis not present

## 2019-01-14 DIAGNOSIS — I472 Ventricular tachycardia, unspecified: Secondary | ICD-10-CM

## 2019-01-14 DIAGNOSIS — I442 Atrioventricular block, complete: Secondary | ICD-10-CM | POA: Diagnosis not present

## 2019-01-14 DIAGNOSIS — M7541 Impingement syndrome of right shoulder: Secondary | ICD-10-CM | POA: Insufficient documentation

## 2019-01-14 DIAGNOSIS — Z23 Encounter for immunization: Secondary | ICD-10-CM

## 2019-01-14 DIAGNOSIS — M75101 Unspecified rotator cuff tear or rupture of right shoulder, not specified as traumatic: Secondary | ICD-10-CM | POA: Diagnosis not present

## 2019-01-14 DIAGNOSIS — K4090 Unilateral inguinal hernia, without obstruction or gangrene, not specified as recurrent: Secondary | ICD-10-CM

## 2019-01-14 DIAGNOSIS — I2510 Atherosclerotic heart disease of native coronary artery without angina pectoris: Secondary | ICD-10-CM | POA: Diagnosis not present

## 2019-01-14 DIAGNOSIS — M12811 Other specific arthropathies, not elsewhere classified, right shoulder: Secondary | ICD-10-CM | POA: Insufficient documentation

## 2019-01-14 NOTE — Progress Notes (Signed)
Telehealth office visit note for George [name], D.O- at Primary Care at [facility]   I connected with current patient today and verified that I am speaking with the correct person using two identifiers.   . Location of the patient: Home . Location of the provider: Office Only the patient (+/- their family members at pt's discretion) and myself were participating in the encounter - This visit type was conducted due to national recommendations for restrictions regarding the COVID-19 Pandemic (e.g. social distancing) in an effort to limit this patient's exposure and mitigate transmission in our community.  This format is felt to be most appropriate for this patient at this time.   - The patient did not have access to video technology or had technical difficulties with video requiring transitioning to audio format only. - No physical exam could be performed with this format, beyond that communicated to [place] by the patient/ family members as noted.   - Additionally my office staff/ schedulers discussed with the patient that there may be a monetary charge related to this service, depending on their medical insurance.   The patient expressed understanding, and agreed to proceed.       History of Present Illness:  I, [name], am serving as scribe for Dr. Mellody [name].  Per patient, when asked if he has health issues going on, he states "yes and no."  - Need for Shoulder Surgery Says "I have had a doctor's visit with Dr. Ronnie Derby for my right side shoulder; they had to do a CT scan with dye, and they found I've got a partial tear in my rotator cuff, and the big problem is that I've got the bone spurs."  Was told he has a "C-type, type three acromion."  He's looking into surgery for this.  - Need for Hernia Repair  Notes he has a hernia in his right side, lower abdomen, and "I was thinking if I have this [shoulder] surgery done, I'd also like to have the hernia done since my  insurance deductibles will be met."  Says he had a hernia done on the left side in 1998, through Dr. Dalbert Batman of [facility].  Notes he has an upcoming appointment with cardiology in December.  "I just want to get this stuff set up and rolling so I can get this stuff done."   Depression screen [facility] 2/9 01/14/2019 06/23/2018 05/26/2018 06/05/2017 05/13/2017  Decreased Interest 0 0 0 0 0  Down, Depressed, Hopeless 0 0 0 0 0  PHQ - 2 Score 0 0 0 0 0  Altered sleeping 0 0 0 0 0  Tired, decreased energy 1 0 0 0 1  Change in appetite 0 0 0 0 0  Feeling bad or failure about yourself  0 0 0 0 0  Trouble concentrating 0 0 0 0 0  Moving slowly or fidgety/restless 0 0 0 0 0  Suicidal thoughts 0 0 0 0 0  PHQ-9 Score 1 0 0 0 1  Difficult doing work/chores Not difficult at all Not difficult at all - Not difficult at all Not difficult at all      Impression and Recommendations:    1. Inguinal hernia of right side without obstruction or gangrene   2. Coronary artery disease involving native coronary artery of native heart without angina pectoris   3. h/o Complete heart block ([place])   4. h/o VT (ventricular tachycardia) ([place])   5. BBBB (bilateral bundle branch block)  6. Rotator cuff tear arthropathy of right shoulder   7. Impingement syndrome of right shoulder   8. Need for influenza vaccination      Need for General Surgery - Inguinal Hernia, Right Side, w/out Obstruction - Discussed follow up with [facility] for general hernia repair. - Encouraged patient to research available doctors and request desired doctor PRN.  - Ambulatory referral placed to general surgery today.  If patient hasn't heard about surgery two business days from now, he will call front desk for further information.  - Discussed that at his age, patient will need a pre-op evaluation. - Reviewed patient's options for pre-op visit during appointment today.  - Education provided and lengthy  conversation held.  All questions answered. - Will continue to monitor.  Rotator Cuff Tear Arthropathy of Right Shoulder - Patient knows to call and ask Dr. Ronnie Derby about pre-op clearance. - Patient will follow up with orthopedics regarding surgery as scheduled. - Will continue to monitor.  Cardiac Health - CAD, h/o complete heart block, VT, BBBB - Patient will ask about need for pre-op note from cardiology.  Encouraged patient to update his cardiologist regarding his plans for surgery and obtain clearance from cardiology PRN.  - Discussion held and all questions answered. - Will continue to monitor.  Recommendations - Patient will continue to follow up for chronic care as previously discussed.  - As part of my medical decision making, I reviewed the following data within the [place] History obtained from pt /family, CMA notes reviewed and incorporated if applicable, Labs reviewed, Radiograph/ tests reviewed if applicable and OV notes from prior OV's with me, as well as other specialists she/he has seen since seeing me last, were all reviewed and used in my medical decision making process today.    - Additionally, discussion had with patient regarding our treatment plan, and their biases/concerns about that plan were used in my medical decision making today.    - The patient agreed with the plan and demonstrated an understanding of the instructions.   No barriers to understanding were identified.     - The patient was advised to call back or seek an in-person evaluation if the symptoms worsen or if the condition fails to improve as anticipated.   Return for chronic care as previously discussed.    Orders Placed This Encounter  Procedures  . Flu Vaccine QUAD High Dose(Fluad)  . Ambulatory referral to General Surgery    Medications Discontinued During This Encounter  Medication Reason  . loratadine (CLARITIN) 10 MG tablet Error      I provided 12 minutes of  non face-to-face time during this encounter.  Additional time was spent with charting and coordination of care after the actual visit commenced.   Note:  This note was prepared with assistance of Dragon voice recognition software. Occasional wrong-word or sound-a-like substitutions may have occurred due to the inherent limitations of voice recognition software.   This document serves as a record of services personally performed by George Dance, DO. It was created on her behalf by [name], a trained medical scribe. The creation of this record is based on the scribe's personal observations and the provider's statements to them.   This case required medical decision making of at least moderate complexity. The above documentation has been reviewed to be accurate and was completed by Marjory Sneddon, D.O.      Patient Care Team    Relationship Specialty Notifications Start End  George Dance, DO PCP - General Family Medicine  09/14/15   [name], MD PCP - Cardiology Cardiology Admissions 12/26/17   Wonda Horner, MD Consulting Physician Gastroenterology  11/12/16   Allyn Kenner, MD Consulting Physician Dermatology  11/13/16   Pa, [place] Physician Optometry  11/13/16   [name], MD Consulting Physician Cardiology  05/13/17    Comment: PM placement and EP     -Vitals obtained; medications/ allergies reconciled;  personal medical, social, Sx etc.histories were updated by CMA, reviewed by me and are reflected in chart   Patient Active Problem List   Diagnosis Date Noted  . Hyperlipidemia LDL goal <100 05/13/2017    Priority: High  . Rotator cuff tear arthropathy of right shoulder 01/14/2019  . Impingement syndrome of right shoulder 01/14/2019  . VT (ventricular tachycardia) ([place]) 11/12/2018  . Environmental and seasonal allergies 06/09/2018  . Senile solar keratosis 05/26/2018  . Contact dermatitis 05/26/2018  . Eczema of eyelid  05/26/2018  . Inguinal hernia of right side without obstruction or gangrene 05/26/2018  . CAD (coronary artery disease), native coronary artery 01/30/2018  . Coronary artery aneurysm 01/30/2018  . Other male erectile dysfunction- "hx of fractured penis requiring sx" 05/20/2017  . Dizziness 08/21/2016  . BBBB (bilateral bundle branch block) 10/18/2015  . Complete heart block ([place]) 10/18/2015  . Vitamin B12 deficiency 10/06/2015  . Vitamin D insufficiency 10/06/2015  . Bradycardia 10/06/2015  . Hereditary hyperbilirubinemia 10/06/2015  . H/O noncompliance with medical treatment, presenting hazards to health 09/19/2015  . Peyronie's disease 09/19/2015  . DOE (dyspnea on exertion) 09/19/2015  . Unexplained weight loss 09/14/2015  . Essential hypertension 09/14/2015  . h/o Non-compliance: Has not seen a doc in over 13 years 09/14/2015  . Fatigue 09/14/2015  . History of nonadherence to medical treatment 09/14/2015     Current Meds  Medication Sig  . atorvastatin (LIPITOR) 40 MG tablet Take 1 tablet (40 mg total) by mouth daily at 6 PM.  . carvedilol (COREG) 25 MG tablet TAKE 1 TABLET BY MOUTH TWICE A DAY WITH MEALS  . Cholecalciferol (VITAMIN D3) 5000 units TABS 5,000 IU OTC vitamin D3 daily.  . clotrimazole-betamethasone (LOTRISONE) cream Apply 1 application topically 2 (two) times daily.  . fexofenadine (ALLEGRA) 180 MG tablet TAKE 1 TABLET (180 MG TOTAL) BY MOUTH DAILY. DURING ALLERGY SEASON  . fluticasone (FLONASE) 50 MCG/ACT nasal spray USE 1 SPRAY IN EACH NOSTRIL AFTER SINUS RINSES TWICE DAILY  . hydrocortisone cream 1 % Apply 1 application topically 2 (two) times daily. Apply to eyelids bilaterally  . losartan (COZAAR) 25 MG tablet Take 1 tablet (25 mg total) by mouth at bedtime.  . Magnesium 250 MG TABS Take 250 mg by mouth daily.  . sildenafil (REVATIO) 20 MG tablet Take 20-100 mg by mouth daily as needed.  . vitamin B-12 (CYANOCOBALAMIN) 1000 MCG tablet Take 1,000 mcg by mouth  daily.  . Zinc 50 MG TABS Take by mouth.     Allergies:  No Known Allergies   ROS:  See above HPI for pertinent positives and negatives   Objective:   Blood pressure (!) 146/80, pulse 66, temperature (!) 97 F (36.1 C), temperature source Oral, height 5' 8"  (1.727 m), weight 166 lb (75.3 kg).  (if some vitals are omitted, this means that patient was UNABLE to obtain them even though they were asked to get them prior to OV today.  They were asked to call us at their earliest  convenience with these once obtained. )  General: A & O * 3; sounds in no acute distress; in usual state of health.  Skin: Pt confirms warm and dry extremities and pink fingertips HEENT: Pt confirms lips non-cyanotic Chest: Patient confirms normal chest excursion and movement Respiratory: speaking in full sentences, no conversational dyspnea; patient confirms no use of accessory muscles Psych: insight appears good, mood- appears full

## 2019-01-21 ENCOUNTER — Telehealth: Payer: Self-pay | Admitting: Internal Medicine

## 2019-01-21 NOTE — Telephone Encounter (Signed)
New Message  Patient's wife is calling in to make sure that the cardiac clearance form was received that was faxed over today at 1 pm to fax # [phone]. Please give patient's wife Neoma Laming a call back to confirm.

## 2019-01-21 NOTE — Telephone Encounter (Signed)
I s/w Neoma Laming [facility]) that I have not yet received a fax from Dr. Jeoffrey Massed office for the pt's surgery. She will contact their office and have them re-fax. I confirmed the fax number [phone]. Neoma Laming, thanked me the call and the update.

## 2019-01-22 NOTE — Telephone Encounter (Signed)
° °  [facility] Pre-operative Risk Assessment    Request for surgical clearance:  1. What type of surgery is being performed? Right Shoulder Arthroscopy with Decompression  2. When is this surgery scheduled? TBD  3. What type of clearance is required (medical clearance vs. Pharmacy clearance to hold med vs. Both)? Medical  4. Are there any medications that need to be held prior to surgery and how long? none  5. Practice name and name of physician performing surgery? [facility]: Dr. Irving Shows   6. What is your office phone number [phone]   7.   What is your office fax number [phone]  8.   Anesthesia type (None, local, MAC, general) ? General anesthesia, also a nerve block before surgery to help with pain after surgery    George [name] 01/22/2019, 10:34 AM  _________________________________________________________________   (provider comments below)

## 2019-01-22 NOTE — Telephone Encounter (Signed)
   [facility] Pre-operative Risk Assessment    Request for surgical clearance:  1. What type of surgery is being performed? RIGHT SHOULDER ARTHROSCOPY WITH DECOMPRESSION   2. When is this surgery scheduled? TBD   3. What type of clearance is required (medical clearance vs. Pharmacy clearance to hold med vs. Both)? MEDICAL  4. Are there any medications that need to be held prior to surgery and how long? NONE LISTED   5. Practice name and name of physician performing surgery?  [facility]; DR. Lara Mulch   6. What is your office phone number [phone]    7.   What is your office fax number [phone]  8.   Anesthesia type (None, local, MAC, general) ? NOT LISTED; LEFT MESSAGE TO VERIFY ANESTHESIA TYPE   Julaine Hua 01/22/2019, 8:48 AM  _________________________________________________________________   (provider comments below)

## 2019-01-22 NOTE — Telephone Encounter (Signed)
   Primary Cardiologist:Steven Caryl Comes, MD  Chart reviewed as part of pre-operative protocol coverage. Because of Ramere Manella Seckinger's past medical history and time since last visit, he/she will require a follow-up visit in order to better assess preoperative cardiovascular risk.  Pt is to see Tommye Standard, PA-C 02/19/2019. If needed, we could move this appointment closer if shoulder surgery is pressing.   Pre-op covering staff:  -Please add pre-op clearance to the patients upcoming appointment and inform the provider of the change.   - Please contact requesting surgeon's office via preferred method (i.e, phone, fax) to inform them of need for appointment prior to surgery.  If applicable, this message will also be routed to pharmacy pool and/or primary cardiologist for input on holding anticoagulant/antiplatelet agent as requested below so that this information is available at time of patient's appointment.   Kathyrn Drown, NP  01/22/2019, 9:22 AM

## 2019-01-22 NOTE — Telephone Encounter (Signed)
I s/w Neoma Laming [facility]) who is aware clearance form received and is in process. I did tell her that pt has appt 12/4 with Tommye Standard and that I have asked our EP [place] to see if we could pt sooner appt for surgery clearance. Renee Pain will call with an appt for Monday 11/9.Marland KitchenNeoma Laming thanked me for the call and our help. I will route to Oda Kilts, [place] for appt 11/9. I will remove from the pre op call back pool.

## 2019-01-25 ENCOUNTER — Other Ambulatory Visit: Payer: Self-pay

## 2019-01-25 ENCOUNTER — Ambulatory Visit: Payer: Medicare HMO | Admitting: Student

## 2019-01-25 VITALS — BP 136/78 | HR 60 | Ht 68.0 in | Wt 167.0 lb

## 2019-01-25 DIAGNOSIS — I493 Ventricular premature depolarization: Secondary | ICD-10-CM | POA: Diagnosis not present

## 2019-01-25 DIAGNOSIS — I428 Other cardiomyopathies: Secondary | ICD-10-CM | POA: Diagnosis not present

## 2019-01-25 DIAGNOSIS — I442 Atrioventricular block, complete: Secondary | ICD-10-CM | POA: Diagnosis not present

## 2019-01-25 DIAGNOSIS — Z950 Presence of cardiac pacemaker: Secondary | ICD-10-CM | POA: Diagnosis not present

## 2019-01-25 NOTE — Progress Notes (Signed)
Electrophysiology Office Note Date: 01/25/2019  ID:  George [name], DOB 1951/02/16, MRN [id number]  PCP: Mellody Dance, DO Primary Cardiologist: Virl Axe, MD Electrophysiologist: Dr. Caryl Comes  CC: Pacemaker follow-up  George [name] is a 68 y.o. male seen today for cardiac clearance for upcoming R shoulder surgery. He is seen for Dr. Caryl Comes. He presents today for routine electrophysiology followup.  Since last being seen in our clinic, the patient reports doing very well.  he denies exertional chest pain, palpitations, dyspnea, PND, orthopnea, nausea, vomiting, dizziness, syncope, edema, weight gain, or early satiety.  He is pending consideration for R shoulder arthroscopy and inguinal hernia repair.    Device History: Medtronic BiV PPM implanted 10/2015 for CHB and Bilateral BBB with cardiomyopathy  Past Medical History:  Diagnosis Date  . Arthritis THUMBS  . LBBB (left bundle branch block)   . Peyronie's disease    Past Surgical History:  Procedure Laterality Date  . CARDIOVASCULAR STRESS TEST  10-18-2002  DR  CRENSHAW   NORMAL ADENOSINE CARDIOLITE/ EF 60%  . CATARACT EXTRACTION W/ INTRAOCULAR LENS IMPLANT  2006   LEFT EYE  . EP IMPLANTABLE DEVICE N/A 10/18/2015   Procedure: BiV Pacemaker Insertion CRT-P;  Surgeon: [name], MD;  Location: [place] [facility];  Service: Cardiovascular;  Laterality: N/A;  . LAPAROSCOPIC CHOLECYSTECTOMY  10-09-2007  . LEFT HEART CATH AND CORONARY ANGIOGRAPHY N/A 01/30/2018   Procedure: LEFT HEART CATH AND CORONARY ANGIOGRAPHY;  Surgeon: Belva Crome, MD;  Location: [place] [facility];  Service: Cardiovascular;  Laterality: N/A;  . NESBIT PROCEDURE  01/17/2012   Procedure: NESBIT PROCEDURE;  Surgeon: [name], MD;  Location: [facility];  Service: Urology;  Laterality: N/A;  16 dot plication   . [place], LEFT EYE  1993  . REPAIR LEFT INGUINAL HERNIA  2005    Current Outpatient Medications   Medication Sig Dispense Refill  . atorvastatin (LIPITOR) 40 MG tablet Take 1 tablet (40 mg total) by mouth daily at 6 PM. 90 tablet 3  . carvedilol (COREG) 25 MG tablet TAKE 1 TABLET BY MOUTH TWICE A DAY WITH MEALS 180 tablet 1  . Cholecalciferol (VITAMIN D3) 5000 units TABS 5,000 IU OTC vitamin D3 daily. 90 tablet 3  . clotrimazole-betamethasone (LOTRISONE) cream Apply 1 application topically 2 (two) times daily. 45 g 0  . fexofenadine (ALLEGRA) 180 MG tablet TAKE 1 TABLET (180 MG TOTAL) BY MOUTH DAILY. DURING ALLERGY SEASON 90 tablet 1  . fluticasone (FLONASE) 50 MCG/ACT nasal spray USE 1 SPRAY IN EACH NOSTRIL AFTER SINUS RINSES TWICE DAILY 48 mL 1  . hydrocortisone cream 1 % Apply 1 application topically 2 (two) times daily. Apply to eyelids bilaterally 30 g 0  . losartan (COZAAR) 25 MG tablet Take 1 tablet (25 mg total) by mouth at bedtime. 90 tablet 3  . Magnesium 250 MG TABS Take 250 mg by mouth daily.    . sildenafil (REVATIO) 20 MG tablet Take 20-100 mg by mouth daily as needed.    . vitamin B-12 (CYANOCOBALAMIN) 1000 MCG tablet Take 1,000 mcg by mouth daily.    . Zinc 50 MG TABS Take by mouth.     No current facility-administered medications for this visit.     Allergies:   Patient has no known allergies.   Social History: Social History   Socioeconomic History  . Marital status: Married    Spouse name: Not on file  . Number of children:  Not on file  . Years of education: Not on file  . Highest education level: Not on file  Occupational History  . Not on file  Social Needs  . Financial resource strain: Not on file  . Food insecurity    Worry: Not on file    Inability: Not on file  . Transportation needs    Medical: Not on file    Non-medical: Not on file  Tobacco Use  . Smoking status: Never Smoker  . Smokeless tobacco: Never Used  Substance and Sexual Activity  . Alcohol use: No  . Drug use: No  . Sexual activity: Yes  Lifestyle  . Physical activity    Days  per week: Not on file    Minutes per session: Not on file  . Stress: Not on file  Relationships  . Social Herbalist on phone: Not on file    Gets together: Not on file    Attends religious service: Not on file    Active member of club or organization: Not on file    Attends meetings of clubs or organizations: Not on file    Relationship status: Not on file  . Intimate partner violence    Fear of current or ex partner: Not on file    Emotionally abused: Not on file    Physically abused: Not on file    Forced sexual activity: Not on file  Other Topics Concern  . Not on file  Social History Narrative  . Not on file    Family History: Family History  Problem Relation Age of Onset  . Diabetes Mother   . Hypertension Mother   . Hyperlipidemia Mother   . Heart disease Father   . Hyperlipidemia Father   . Hypertension Father   . Arrhythmia Father        paf  . Healthy Daughter   . Healthy Son   . Heart disease Paternal Grandfather   . Heart attack Paternal Grandfather      Review of Systems: All other systems reviewed and are otherwise negative except as noted above.  Physical Exam: Vitals:   01/25/19 1317  BP: 136/78  Pulse: 60  Weight: 167 lb (75.8 kg)  Height: 5\' 8"  (1.727 m)     GEN- The patient is well appearing, alert and oriented x 3 today.   HEENT: normocephalic, atraumatic; sclera clear, conjunctiva pink; hearing intact; oropharynx clear; neck supple  Lungs- Clear to ausculation bilaterally, normal work of breathing.  No wheezes, rales, rhonchi Heart- Regular rate and rhythm, no murmurs, rubs or gallops  GI- soft, non-tender, non-distended, bowel sounds present  Extremities- no clubbing, cyanosis, or edema  MS- no significant deformity or atrophy Skin- warm and dry, no rash or lesion; PPM pocket well healed Psych- euthymic mood, full affect Neuro- strength and sensation are intact  PPM Interrogation- reviewed in detail today,  See PACEART  report  EKG:  EKG is ordered today. The ekg ordered today shows A-V dual paced rhythm at 60 bpm  Recent Labs: 09/24/2018: ALT 21; BUN 12; Creatinine, Ser 0.88; Hemoglobin 13.6; Magnesium 2.1; Platelets 173; Potassium 4.7; Sodium 142; TSH 1.110   Wt Readings from Last 3 Encounters:  01/25/19 167 lb (75.8 kg)  01/14/19 166 lb (75.3 kg)  06/23/18 162 lb (73.5 kg)     Other studies Reviewed: Additional studies/ records that were reviewed today include: Echo 01/2017 shows LVEF 50-55%, Previous EP office notes, Previous remote checks, Most recent labwork.  Assessment and Plan:  1.  Symptomatic bradycardia due to Advanced AV block s/p Medtronic BiV PPM  Normal PPM function See Pace Art report No changes today  2. HTN Continue current regimen  3. Atrial tach Burden <1% on device.   4. Cardiac clearance For R shoulder Arthroscopy and Inguinal Hernia. Echo 01/31/2017 EF 50-55% Cardiac cath 01/2018 with non-obstructive CAD Denies ischemic symptoms Pt is at a low risk of peri-operative cardiac complications from these procedures and is OK to proceed from a cardiac perspective.  Should fax device clearance form to [phone] (device clinic) for specific device instructions for each procedure, as they will be different (one below the waist and one at shoulder height).  Current medicines are reviewed at length with the patient today.   The patient does not have concerns regarding his medicines.  The following changes were made today:  none  Labs/ tests ordered today include:  Orders Placed This Encounter  Procedures  . CUP PACEART [place]  . HEART 12-Lead   Disposition:   Follow up with Dr. Caryl Comes in 6 months.    Signed, Shirley Friar, PA-C  01/25/2019  [address] [place] [address] [phone] (office) [phone] (fax)

## 2019-01-25 NOTE — Patient Instructions (Signed)
Medication Instructions:  Your physician recommends that you continue on your current medications as directed. Please refer to the Current Medication list given to you today.  *If you need a refill on your cardiac medications before your next appointment, please call your pharmacy*  Lab Work: NONE ORDERED  TODAY   If you have labs (blood work) drawn today and your tests are completely normal, you will receive your results only by: . MyChart Message (if you have MyChart) OR . A paper copy in the mail If you have any lab test that is abnormal or we need to change your treatment, we will call you to review the results.  Testing/Procedures: NONE ORDERED  TODAY  Follow-Up: At [facility], you and your health needs are our priority.  As part of our continuing mission to provide you with exceptional heart care, we have created designated Provider Care Teams.  These Care Teams include your primary Cardiologist (physician) and Advanced Practice Providers (APPs -  Physician Assistants and Nurse Practitioners) who all work together to provide you with the care you need, when you need it.  Your next appointment:   6 month(s)  The format for your next appointment:   In Person  Provider:   You may see Dr. Klein  or one of the following Advanced Practice Providers on your designated Care Team:    Amber Seiler, NP  Renee Ursuy, PA-C  Michael "Andy" Tillery, PA-C   Other Instructions    

## 2019-01-26 LAB — CUP PACEART INCLINIC DEVICE CHECK
Date Time Interrogation Session: 20201110082118
Implantable Lead Implant Date: 20170802
Implantable Lead Implant Date: 20170802
Implantable Lead Implant Date: 20170802
Implantable Lead Location: 753858
Implantable Lead Location: 753859
Implantable Lead Location: 753860
Implantable Lead Model: 5076
Implantable Lead Model: 5076
Implantable Pulse Generator Implant Date: 20170802

## 2019-01-27 ENCOUNTER — Ambulatory Visit (INDEPENDENT_AMBULATORY_CARE_PROVIDER_SITE_OTHER): Payer: Medicare HMO | Admitting: *Deleted

## 2019-01-27 DIAGNOSIS — I472 Ventricular tachycardia, unspecified: Secondary | ICD-10-CM

## 2019-01-27 DIAGNOSIS — I442 Atrioventricular block, complete: Secondary | ICD-10-CM

## 2019-01-28 ENCOUNTER — Telehealth: Payer: Self-pay

## 2019-01-28 LAB — CUP PACEART REMOTE DEVICE CHECK
Battery Remaining Longevity: 98 mo
Battery Voltage: 2.98 V
Brady Statistic AP VP Percent: 34.49 %
Brady Statistic AP VS Percent: 0.01 %
Brady Statistic AS VP Percent: 64.99 %
Brady Statistic AS VS Percent: 0.51 %
Brady Statistic RA Percent Paced: 34.81 %
Brady Statistic RV Percent Paced: 99.47 %
Date Time Interrogation Session: 20201112204339
Implantable Lead Implant Date: 20170802
Implantable Lead Implant Date: 20170802
Implantable Lead Implant Date: 20170802
Implantable Lead Location: 753858
Implantable Lead Location: 753859
Implantable Lead Location: 753860
Implantable Lead Model: 5076
Implantable Lead Model: 5076
Implantable Pulse Generator Implant Date: 20170802
Lead Channel Impedance Value: 1045 Ohm
Lead Channel Impedance Value: 1102 Ohm
Lead Channel Impedance Value: 1140 Ohm
Lead Channel Impedance Value: 323 Ohm
Lead Channel Impedance Value: 380 Ohm
Lead Channel Impedance Value: 418 Ohm
Lead Channel Impedance Value: 456 Ohm
Lead Channel Impedance Value: 513 Ohm
Lead Channel Impedance Value: 532 Ohm
Lead Channel Impedance Value: 551 Ohm
Lead Channel Impedance Value: 722 Ohm
Lead Channel Impedance Value: 779 Ohm
Lead Channel Impedance Value: 817 Ohm
Lead Channel Impedance Value: 931 Ohm
Lead Channel Pacing Threshold Amplitude: 0.625 V
Lead Channel Pacing Threshold Amplitude: 0.875 V
Lead Channel Pacing Threshold Amplitude: 1.25 V
Lead Channel Pacing Threshold Pulse Width: 0.4 ms
Lead Channel Pacing Threshold Pulse Width: 0.4 ms
Lead Channel Pacing Threshold Pulse Width: 0.8 ms
Lead Channel Sensing Intrinsic Amplitude: 1.875 mV
Lead Channel Sensing Intrinsic Amplitude: 1.875 mV
Lead Channel Sensing Intrinsic Amplitude: 8.125 mV
Lead Channel Sensing Intrinsic Amplitude: 8.125 mV
Lead Channel Setting Pacing Amplitude: 1.5 V
Lead Channel Setting Pacing Amplitude: 1.75 V
Lead Channel Setting Pacing Amplitude: 2.5 V
Lead Channel Setting Pacing Pulse Width: 0.4 ms
Lead Channel Setting Pacing Pulse Width: 0.8 ms
Lead Channel Setting Sensing Sensitivity: 2.8 mV

## 2019-01-28 NOTE — Telephone Encounter (Signed)
Left message for patient to remind of missed remote transmission. LL 

## 2019-02-03 DIAGNOSIS — M7541 Impingement syndrome of right shoulder: Secondary | ICD-10-CM | POA: Diagnosis not present

## 2019-02-03 DIAGNOSIS — M19011 Primary osteoarthritis, right shoulder: Secondary | ICD-10-CM | POA: Diagnosis not present

## 2019-02-03 DIAGNOSIS — M94211 Chondromalacia, right shoulder: Secondary | ICD-10-CM | POA: Diagnosis not present

## 2019-02-03 DIAGNOSIS — G8918 Other acute postprocedural pain: Secondary | ICD-10-CM | POA: Diagnosis not present

## 2019-02-03 DIAGNOSIS — M24111 Other articular cartilage disorders, right shoulder: Secondary | ICD-10-CM | POA: Diagnosis not present

## 2019-02-03 DIAGNOSIS — M65811 Other synovitis and tenosynovitis, right shoulder: Secondary | ICD-10-CM | POA: Diagnosis not present

## 2019-02-03 DIAGNOSIS — M75111 Incomplete rotator cuff tear or rupture of right shoulder, not specified as traumatic: Secondary | ICD-10-CM | POA: Diagnosis not present

## 2019-02-05 ENCOUNTER — Ambulatory Visit: Payer: Self-pay | Admitting: Surgery

## 2019-02-05 DIAGNOSIS — M25511 Pain in right shoulder: Secondary | ICD-10-CM | POA: Diagnosis not present

## 2019-02-05 DIAGNOSIS — M25611 Stiffness of right shoulder, not elsewhere classified: Secondary | ICD-10-CM | POA: Diagnosis not present

## 2019-02-05 DIAGNOSIS — K4090 Unilateral inguinal hernia, without obstruction or gangrene, not specified as recurrent: Secondary | ICD-10-CM | POA: Diagnosis not present

## 2019-02-05 DIAGNOSIS — Z9889 Other specified postprocedural states: Secondary | ICD-10-CM | POA: Diagnosis not present

## 2019-02-05 DIAGNOSIS — R6889 Other general symptoms and signs: Secondary | ICD-10-CM | POA: Diagnosis not present

## 2019-02-05 DIAGNOSIS — G8929 Other chronic pain: Secondary | ICD-10-CM | POA: Diagnosis not present

## 2019-02-05 NOTE — H&P (Signed)
George [name] Documented: 02/05/2019 9:14 AM Location: [facility] Patient #: [id number] DOB: August 19, 1950 Married / Language: George [name] / Race: White Male  History of Present Illness George [name]; 02/05/2019 10:45 AM) Patient words: Patient presents with chief complaint of right groin bulge since March of this year. He has a history of a left inguinal hernia repair back in the 1990s. He has developed a bulge in his right groin. It is more noticeable when he showers or stands. He has mild to moderate discomfort in the bulge is less when he is recumbent. Denies any testicular pain or radiation of pain. Discomfort is described as mild to moderate and achy in nature especially with lifting or prolonged standing. He recently had a right shoulder arthroscopy.  The patient is a 69 year old male.   Past Surgical History Sharyn Lull R. Brooks, CMA; 02/05/2019 9:15 AM) Cataract Surgery Left. Colon Polyp Removal - Colonoscopy Gallbladder Surgery - Laparoscopic Open Inguinal Hernia Surgery Left. Oral Surgery Shoulder Surgery Right.  Diagnostic Studies History Sharyn Lull R. Brooks, CMA; 02/05/2019 9:15 AM) Colonoscopy 1-5 years ago  Allergies Sharyn Lull R. Brooks, CMA; 02/05/2019 9:15 AM) No Known Drug Allergies [02/05/2019]:  Medication History Sharyn Lull R. Brooks, CMA; 02/05/2019 9:18 AM) Atorvastatin Calcium (40MG  Tablet, Oral) Active. Carvedilol (25MG  Tablet, Oral) Active. Clotrimazole-Betamethasone (1-0.05% Cream, External) Active. Fexofenadine HCl (180MG  Tablet, Oral) Active. Fluticasone Propionate (50MCG/ACT Suspension, Nasal) Active. Losartan Potassium (25MG  Tablet, Oral) Active. Vitamin D3 (Oral) Specific strength unknown - Active. Magnesium (250MG  Tablet, Oral) Active. Sildenafil Citrate (20MG  Tablet, Oral) Active. Vitamin B-12 (1000MCG Tablet, Oral) Active. Zinc (50MG  Tablet, Oral) Active. Medications Reconciled  Social History Sharyn Lull  R. Brooks, CMA; 02/05/2019 9:15 AM) Caffeine use Carbonated beverages, Tea. No alcohol use No drug use Tobacco use Never smoker.  Family History Sharyn Lull R. Rolena Infante, CMA; 02/05/2019 9:15 AM) Arthritis Brother, Mother, Sister. Depression Brother, Mother. Heart disease in male family member before age 89  Other Problems Sharyn Lull R. Brooks, CMA; 02/05/2019 9:15 AM) Arthritis Cholelithiasis Diverticulosis Inguinal Hernia Other disease, cancer, significant illness     Review of Systems [facility] R. Brooks CMA; 02/05/2019 9:15 AM) General Not Present- Appetite Loss, Chills, Fatigue, Fever, Night Sweats, Weight Gain and Weight Loss. Skin Not Present- Change in Wart/Mole, Dryness, Hives, Jaundice, New Lesions, Non-Healing Wounds, Rash and Ulcer. HEENT Present- Wears glasses/contact lenses. Not Present- Earache, Hearing Loss, Hoarseness, Nose Bleed, Oral Ulcers, Ringing in the Ears, Seasonal Allergies, Sinus Pain, Sore Throat, Visual Disturbances and Yellow Eyes. Respiratory Not Present- Bloody sputum, Chronic Cough, Difficulty Breathing, Snoring and Wheezing. Breast Not Present- Breast Mass, Breast Pain, Nipple Discharge and Skin Changes. Cardiovascular Not Present- Chest Pain, Difficulty Breathing Lying Down, Leg Cramps, Palpitations, Rapid Heart Rate, Shortness of Breath and Swelling of Extremities. Gastrointestinal Not Present- Abdominal Pain, Bloating, Bloody Stool, Change in Bowel Habits, Chronic diarrhea, Constipation, Difficulty Swallowing, Excessive gas, Gets full quickly at meals, Hemorrhoids, Indigestion, Nausea, Rectal Pain and Vomiting. Male Genitourinary Not Present- Blood in Urine, Change in Urinary Stream, Frequency, Impotence, Nocturia, Painful Urination, Urgency and Urine Leakage. Musculoskeletal Not Present- Back Pain, Joint Pain, Joint Stiffness, Muscle Pain, Muscle Weakness and Swelling of Extremities. Neurological Not Present- Decreased Memory, Fainting,  Headaches, Numbness, Seizures, Tingling, Tremor, Trouble walking and Weakness. Psychiatric Not Present- Anxiety, Bipolar, Change in Sleep Pattern, Depression, Fearful and Frequent crying. Endocrine Not Present- Cold Intolerance, Excessive Hunger, Hair Changes, Heat Intolerance, Hot flashes and New Diabetes. Hematology Not Present- Blood Thinners, Easy Bruising, Excessive bleeding, Gland problems, HIV and Persistent  Infections.  Vitals [employer] R. Brooks CMA; 02/05/2019 9:15 AM) 02/05/2019 9:15 AM Weight: 165.38 lb Height: 68in Body Surface Area: 1.89 m Body Mass Index: 25.14 kg/m  Pulse: 83 (Regular)  BP: 132/80 (Sitting, Left Arm, Standard)        Physical Exam (George Dacey A. Jazon Jipson [name]; 02/05/2019 10:46 AM)  General Mental Status-Alert. General Appearance-Consistent with stated age. Hydration-Well hydrated. Voice-Normal.  Chest and Lung Exam Note: Pacemaker in place  Cardiovascular Note: nsr  Abdomen Note: Small reducible right inguinal hernia. Left groin scar noted.  Neurologic Neurologic evaluation reveals -alert and oriented x 3 with no impairment of recent or remote memory. Mental Status-Normal.  Musculoskeletal Note: Right arm in sling    Assessment & Plan (Adin Lariccia A. Jennaya Pogue [name]; 02/05/2019 10:46 AM)  RIGHT INGUINAL HERNIA (K40.90) Impression: Discussed surgical repair with mesh of his inguinal hernia. He's opted for repair of his right internal hernia with mesh  The risk of hernia repair include bleeding, infection, organ injury, bowel injury, bladder injury, nerve injury recurrent hernia, blood clots, worsening of underlying condition, chronic pain, mesh use, open surgery, death, and the need for other operattions. Pt agrees to proceed  Current Plans Pt Education - CCS Mesh education: discussed with patient and provided information. Pt Education - Consent for inguinal hernia - [place]: discussed with patient and provided  information. Pt Education - Pamphlet Given - Hernia Surgery: discussed with patient and provided information. You are being scheduled for surgery- Our schedulers will call you.  You should hear from our office's scheduling department within 5 working days about the location, date, and time of surgery. We try to make accommodations for patient's preferences in scheduling surgery, but sometimes the OR schedule or the surgeon's schedule prevents [place] from making those accommodations.  If you have not heard from our office [phone]) in 5 working days, call the office and ask for your surgeon's nurse.  If you have other questions about your diagnosis, plan, or surgery, call the office and ask for your surgeon's nurse.

## 2019-02-05 NOTE — H&P (View-Only) (Signed)
George [name] Documented: 02/05/2019 9:14 AM Location: [facility] Patient #: [id number] DOB: [birth year] Married / Language: George [name] / Race: White Male  History of Present Illness George [name] A. Midge Momon MD; 02/05/2019 10:45 AM) Patient words: Patient presents with chief complaint of right groin bulge since March of this year. He has a history of a left inguinal hernia repair back in the 1990s. He has developed a bulge in his right groin. It is more noticeable when he showers or stands. He has mild to moderate discomfort in the bulge is less when he is recumbent. Denies any testicular pain or radiation of pain. Discomfort is described as mild to moderate and achy in nature especially with lifting or prolonged standing. He recently had a right shoulder arthroscopy.  The patient is a 68 year old male.   Past Surgical History George [name], CMA; 02/05/2019 9:15 AM) Cataract Surgery Left. Colon Polyp Removal - Colonoscopy Gallbladder Surgery - Laparoscopic Open Inguinal Hernia Surgery Left. Oral Surgery Shoulder Surgery Right.  Diagnostic Studies History George [name], CMA; 02/05/2019 9:15 AM) Colonoscopy 1-5 years ago  Allergies George [name], CMA; 02/05/2019 9:15 AM) No Known Drug Allergies [02/05/2019]:  Medication History George [name], CMA; 02/05/2019 9:18 AM) Atorvastatin Calcium (40MG  Tablet, Oral) Active. Carvedilol (25MG  Tablet, Oral) Active. Clotrimazole-Betamethasone (1-0.05% Cream, External) Active. Fexofenadine HCl (180MG  Tablet, Oral) Active. Fluticasone Propionate (50MCG/ACT Suspension, Nasal) Active. Losartan Potassium (25MG  Tablet, Oral) Active. Vitamin D3 (Oral) Specific strength unknown - Active. Magnesium (250MG  Tablet, Oral) Active. Sildenafil Citrate (20MG  Tablet, Oral) Active. Vitamin B-12 (1000MCG Tablet, Oral) Active. Zinc (50MG  Tablet, Oral) Active. Medications Reconciled  Social History George Lull  R. [name], CMA; 02/05/2019 9:15 AM) Caffeine use Carbonated beverages, Tea. No alcohol use No drug use Tobacco use Never smoker.  Family History George Lull George [name], CMA; 02/05/2019 9:15 AM) Arthritis Brother, Mother, Sister. Depression Brother, Mother. Heart disease in male family member before age 13  Other Problems George [name], CMA; 02/05/2019 9:15 AM) Arthritis Cholelithiasis Diverticulosis Inguinal Hernia Other disease, cancer, significant illness     Review of Systems [facility] R. [name] CMA; 02/05/2019 9:15 AM) General Not Present- Appetite Loss, Chills, Fatigue, Fever, Night Sweats, Weight Gain and Weight Loss. Skin Not Present- Change in Wart/Mole, Dryness, Hives, Jaundice, New Lesions, Non-Healing Wounds, Rash and Ulcer. HEENT Present- Wears glasses/contact lenses. Not Present- Earache, Hearing Loss, Hoarseness, Nose Bleed, Oral Ulcers, Ringing in the Ears, Seasonal Allergies, Sinus Pain, Sore Throat, Visual Disturbances and Yellow Eyes. Respiratory Not Present- Bloody sputum, Chronic Cough, Difficulty Breathing, Snoring and Wheezing. Breast Not Present- Breast Mass, Breast Pain, Nipple Discharge and Skin Changes. Cardiovascular Not Present- Chest Pain, Difficulty Breathing Lying Down, Leg Cramps, Palpitations, Rapid Heart Rate, Shortness of Breath and Swelling of Extremities. Gastrointestinal Not Present- Abdominal Pain, Bloating, Bloody Stool, Change in Bowel Habits, Chronic diarrhea, Constipation, Difficulty Swallowing, Excessive gas, Gets full quickly at meals, Hemorrhoids, Indigestion, Nausea, Rectal Pain and Vomiting. Male Genitourinary Not Present- Blood in Urine, Change in Urinary Stream, Frequency, Impotence, Nocturia, Painful Urination, Urgency and Urine Leakage. Musculoskeletal Not Present- Back Pain, Joint Pain, Joint Stiffness, Muscle Pain, Muscle Weakness and Swelling of Extremities. Neurological Not Present- Decreased Memory, Fainting,  Headaches, Numbness, Seizures, Tingling, Tremor, Trouble walking and Weakness. Psychiatric Not Present- Anxiety, Bipolar, Change in Sleep Pattern, Depression, Fearful and Frequent crying. Endocrine Not Present- Cold Intolerance, Excessive Hunger, Hair Changes, Heat Intolerance, Hot flashes and New Diabetes. Hematology Not Present- Blood Thinners, Easy Bruising, Excessive bleeding, Gland problems, HIV and Persistent  Infections.  Vitals [employer] R. [name] CMA; 02/05/2019 9:15 AM) 02/05/2019 9:15 AM Weight: 165.38 lb Height: 68in Body Surface Area: 1.89 m Body Mass Index: 25.14 kg/m  Pulse: 83 (Regular)  BP: 132/80 (Sitting, Left Arm, Standard)        Physical Exam (George Milling A. Atalia Litzinger MD; 02/05/2019 10:46 AM)  General Mental Status-Alert. General Appearance-Consistent with stated age. Hydration-Well hydrated. Voice-Normal.  Chest and Lung Exam Note: Pacemaker in place  Cardiovascular Note: nsr  Abdomen Note: Small reducible right inguinal hernia. Left groin scar noted.  Neurologic Neurologic evaluation reveals -alert and oriented x 3 with no impairment of recent or remote memory. Mental Status-Normal.  Musculoskeletal Note: Right arm in sling    Assessment & Plan (George Lasseigne A. Nedda Gains MD; 02/05/2019 10:46 AM)  RIGHT INGUINAL HERNIA (K40.90) Impression: Discussed surgical repair with mesh of his inguinal hernia. He's opted for repair of his right internal hernia with mesh  The risk of hernia repair include bleeding, infection, organ injury, bowel injury, bladder injury, nerve injury recurrent hernia, blood clots, worsening of underlying condition, chronic pain, mesh use, open surgery, death, and the need for other operattions. Pt agrees to proceed  Current Plans Pt Education - CCS Mesh education: discussed with patient and provided information. Pt Education - Consent for inguinal hernia - Kinsinger: discussed with patient and provided  information. Pt Education - Pamphlet Given - Hernia Surgery: discussed with patient and provided information. You are being scheduled for surgery- Our schedulers will call you.  You should hear from our office's scheduling department within 5 working days about the location, date, and time of surgery. We try to make accommodations for patient's preferences in scheduling surgery, but sometimes the OR schedule or the surgeon's schedule prevents [place] from making those accommodations.  If you have not heard from our office [phone]) in 5 working days, call the office and ask for your surgeon's nurse.  If you have other questions about your diagnosis, plan, or surgery, call the office and ask for your surgeon's nurse.

## 2019-02-09 DIAGNOSIS — Z9889 Other specified postprocedural states: Secondary | ICD-10-CM | POA: Insufficient documentation

## 2019-02-16 ENCOUNTER — Encounter (HOSPITAL_BASED_OUTPATIENT_CLINIC_OR_DEPARTMENT_OTHER): Payer: Self-pay | Admitting: *Deleted

## 2019-02-16 ENCOUNTER — Other Ambulatory Visit: Payer: Self-pay

## 2019-02-16 ENCOUNTER — Other Ambulatory Visit: Payer: Self-pay | Admitting: Internal Medicine

## 2019-02-17 NOTE — Progress Notes (Signed)
Remote pacemaker transmission.   

## 2019-02-18 ENCOUNTER — Other Ambulatory Visit: Payer: Self-pay

## 2019-02-18 ENCOUNTER — Encounter (HOSPITAL_BASED_OUTPATIENT_CLINIC_OR_DEPARTMENT_OTHER)
Admission: RE | Admit: 2019-02-18 | Discharge: 2019-02-18 | Disposition: A | Discharge status: Home / Self Care | Payer: Medicare HMO | Source: Ambulatory Visit | Attending: Surgery | Admitting: Surgery

## 2019-02-18 DIAGNOSIS — Z01812 Encounter for preprocedural laboratory examination: Secondary | ICD-10-CM | POA: Diagnosis present

## 2019-02-18 LAB — CBC WITH DIFFERENTIAL/PLATELET
Abs Immature Granulocytes: 0.03 10*3/uL (ref 0.00–0.07)
Basophils Absolute: 0 10*3/uL (ref 0.0–0.1)
Basophils Relative: 1 %
Eosinophils Absolute: 0.2 10*3/uL (ref 0.0–0.5)
Eosinophils Relative: 2 %
HCT: 39.5 % (ref 39.0–52.0)
Hemoglobin: 13.8 g/dL (ref 13.0–17.0)
Immature Granulocytes: 0 %
Lymphocytes Relative: 37 %
Lymphs Abs: 3.1 10*3/uL (ref 0.7–4.0)
MCH: 31.2 pg (ref 26.0–34.0)
MCHC: 34.9 g/dL (ref 30.0–36.0)
MCV: 89.2 fL (ref 80.0–100.0)
Monocytes Absolute: 0.7 10*3/uL (ref 0.1–1.0)
Monocytes Relative: 9 %
Neutro Abs: 4.3 10*3/uL (ref 1.7–7.7)
Neutrophils Relative %: 51 %
Platelets: 229 10*3/uL (ref 150–400)
RBC: 4.43 MIL/uL (ref 4.22–5.81)
RDW: 11.9 % (ref 11.5–15.5)
WBC: 8.4 10*3/uL (ref 4.0–10.5)
nRBC: 0 % (ref 0.0–0.2)

## 2019-02-18 LAB — COMPREHENSIVE METABOLIC PANEL
ALT: 23 U/L (ref 0–44)
AST: 24 U/L (ref 15–41)
Albumin: 3.9 g/dL (ref 3.5–5.0)
Alkaline Phosphatase: 90 U/L (ref 38–126)
Anion gap: 8 (ref 5–15)
BUN: 10 mg/dL (ref 8–23)
CO2: 26 mmol/L (ref 22–32)
Calcium: 9.6 mg/dL (ref 8.9–10.3)
Chloride: 105 mmol/L (ref 98–111)
Creatinine, Ser: 0.84 mg/dL (ref 0.61–1.24)
GFR calc Af Amer: >60 mL/min (ref >60)
GFR calc non Af Amer: >60 mL/min (ref >60)
Glucose, Bld: 112 mg/dL — ABNORMAL HIGH (ref 70–99)
Potassium: 4.2 mmol/L (ref 3.5–5.1)
Sodium: 139 mmol/L (ref 135–145)
Total Bilirubin: 2.3 mg/dL — ABNORMAL HIGH (ref 0.3–1.2)
Total Protein: 7.3 g/dL (ref 6.5–8.1)

## 2019-02-18 MED ORDER — ENSURE PRE-SURGERY PO LIQD: 296.0000 mL | Freq: Once | ORAL | Status: DC

## 2019-02-18 NOTE — Progress Notes (Signed)

## 2019-02-19 ENCOUNTER — Other Ambulatory Visit (HOSPITAL_COMMUNITY)
Admission: RE | Admit: 2019-02-19 | Discharge: 2019-02-19 | Disposition: A | Discharge status: Home / Self Care | Payer: Medicare HMO | Source: Ambulatory Visit | Attending: Surgery | Admitting: Surgery

## 2019-02-19 ENCOUNTER — Encounter: Payer: Medicare HMO | Admitting: Physician Assistant

## 2019-02-19 DIAGNOSIS — Z9889 Other specified postprocedural states: Secondary | ICD-10-CM | POA: Diagnosis not present

## 2019-02-19 DIAGNOSIS — Z01812 Encounter for preprocedural laboratory examination: Secondary | ICD-10-CM | POA: Diagnosis not present

## 2019-02-19 DIAGNOSIS — Z20828 Contact with and (suspected) exposure to other viral communicable diseases: Secondary | ICD-10-CM | POA: Diagnosis not present

## 2019-02-19 DIAGNOSIS — M25611 Stiffness of right shoulder, not elsewhere classified: Secondary | ICD-10-CM | POA: Diagnosis not present

## 2019-02-19 DIAGNOSIS — R6889 Other general symptoms and signs: Secondary | ICD-10-CM | POA: Diagnosis not present

## 2019-02-20 LAB — NOVEL CORONAVIRUS, NAA (HOSP ORDER, SEND-OUT TO REF LAB; TAT 18-24 HRS): SARS-CoV-2, NAA: NOT DETECTED

## 2019-02-22 MED ORDER — DEXAMETHASONE SODIUM PHOSPHATE 10 MG/ML IJ SOLN
INTRAMUSCULAR | Status: AC
  Filled 2019-02-22: qty 1

## 2019-02-22 MED ORDER — LIDOCAINE 2% (20 MG/ML) 5 ML SYRINGE
INTRAMUSCULAR | Status: AC
  Filled 2019-02-22: qty 5

## 2019-02-22 MED ORDER — SUCCINYLCHOLINE CHLORIDE 200 MG/10ML IV SOSY
PREFILLED_SYRINGE | INTRAVENOUS | Status: AC
  Filled 2019-02-22: qty 10

## 2019-02-22 MED ORDER — ONDANSETRON HCL 4 MG/2ML IJ SOLN
INTRAMUSCULAR | Status: AC
  Filled 2019-02-22: qty 2

## 2019-02-22 MED ORDER — PROPOFOL 10 MG/ML IV BOLUS
INTRAVENOUS | Status: AC
  Filled 2019-02-22: qty 40

## 2019-02-23 ENCOUNTER — Ambulatory Visit (HOSPITAL_BASED_OUTPATIENT_CLINIC_OR_DEPARTMENT_OTHER): Payer: Medicare HMO | Admitting: Anesthesiology

## 2019-02-23 ENCOUNTER — Ambulatory Visit (HOSPITAL_BASED_OUTPATIENT_CLINIC_OR_DEPARTMENT_OTHER)
Admission: RE | Admit: 2019-02-23 | Discharge: 2019-02-23 | Disposition: A | Discharge status: Home / Self Care | Payer: Medicare HMO | Attending: Surgery | Admitting: Surgery

## 2019-02-23 ENCOUNTER — Encounter (HOSPITAL_BASED_OUTPATIENT_CLINIC_OR_DEPARTMENT_OTHER): Payer: Self-pay | Admitting: *Deleted

## 2019-02-23 ENCOUNTER — Encounter (HOSPITAL_BASED_OUTPATIENT_CLINIC_OR_DEPARTMENT_OTHER)
Admission: RE | Disposition: A | Discharge status: Home / Self Care | Payer: Self-pay | Source: Home / Self Care | Attending: Surgery

## 2019-02-23 ENCOUNTER — Other Ambulatory Visit: Payer: Self-pay

## 2019-02-23 DIAGNOSIS — I2510 Atherosclerotic heart disease of native coronary artery without angina pectoris: Secondary | ICD-10-CM | POA: Diagnosis not present

## 2019-02-23 DIAGNOSIS — G8918 Other acute postprocedural pain: Secondary | ICD-10-CM | POA: Diagnosis not present

## 2019-02-23 DIAGNOSIS — I10 Essential (primary) hypertension: Secondary | ICD-10-CM | POA: Diagnosis not present

## 2019-02-23 DIAGNOSIS — Z950 Presence of cardiac pacemaker: Secondary | ICD-10-CM | POA: Diagnosis not present

## 2019-02-23 DIAGNOSIS — M1990 Unspecified osteoarthritis, unspecified site: Secondary | ICD-10-CM | POA: Diagnosis not present

## 2019-02-23 DIAGNOSIS — Z79899 Other long term (current) drug therapy: Secondary | ICD-10-CM | POA: Insufficient documentation

## 2019-02-23 DIAGNOSIS — K4090 Unilateral inguinal hernia, without obstruction or gangrene, not specified as recurrent: Secondary | ICD-10-CM | POA: Insufficient documentation

## 2019-02-23 HISTORY — PX: INGUINAL HERNIA REPAIR: SHX194

## 2019-02-23 SURGERY — REPAIR, HERNIA, INGUINAL, ADULT
Anesthesia: General | Site: Groin | Laterality: Right

## 2019-02-23 MED ORDER — DEXTROSE 5 % IV SOLN: 3.0000 g | INTRAVENOUS | Status: DC

## 2019-02-23 MED ORDER — ONDANSETRON HCL 4 MG/2ML IJ SOLN
INTRAMUSCULAR | Status: DC | PRN
  Administered 2019-02-23: 4 mg via INTRAVENOUS

## 2019-02-23 MED ORDER — CHLORHEXIDINE GLUCONATE CLOTH 2 % EX PADS: 6.0000 | MEDICATED_PAD | Freq: Once | CUTANEOUS | Status: DC

## 2019-02-23 MED ORDER — LIDOCAINE 2% (20 MG/ML) 5 ML SYRINGE
INTRAMUSCULAR | Status: AC
  Filled 2019-02-23: qty 5

## 2019-02-23 MED ORDER — DEXAMETHASONE SODIUM PHOSPHATE 10 MG/ML IJ SOLN
INTRAMUSCULAR | Status: DC | PRN
  Administered 2019-02-23: 5 mg via INTRAVENOUS

## 2019-02-23 MED ORDER — FENTANYL CITRATE (PF) 100 MCG/2ML IJ SOLN
25.0000 ug | INTRAMUSCULAR | Status: DC | PRN
  Administered 2019-02-23: 50 ug via INTRAVENOUS

## 2019-02-23 MED ORDER — OXYCODONE HCL 5 MG PO TABS: 5.0000 mg | ORAL_TABLET | Freq: Four times a day (QID) | ORAL | 0 refills | Status: DC | PRN

## 2019-02-23 MED ORDER — MIDAZOLAM HCL 2 MG/2ML IJ SOLN
INTRAMUSCULAR | Status: AC
  Filled 2019-02-23: qty 2

## 2019-02-23 MED ORDER — MIDAZOLAM HCL 2 MG/2ML IJ SOLN
1.0000 mg | INTRAMUSCULAR | Status: DC | PRN
  Administered 2019-02-23: 1 mg via INTRAVENOUS

## 2019-02-23 MED ORDER — GABAPENTIN 300 MG PO CAPS
300.0000 mg | ORAL_CAPSULE | ORAL | Status: AC
  Administered 2019-02-23: 11:00:00 300 mg via ORAL

## 2019-02-23 MED ORDER — ONDANSETRON HCL 4 MG/2ML IJ SOLN
INTRAMUSCULAR | Status: AC
  Filled 2019-02-23: qty 2

## 2019-02-23 MED ORDER — CEFAZOLIN SODIUM-DEXTROSE 2-4 GM/100ML-% IV SOLN
2.0000 g | Freq: Once | INTRAVENOUS | Status: AC
  Administered 2019-02-23: 2 g via INTRAVENOUS

## 2019-02-23 MED ORDER — FENTANYL CITRATE (PF) 100 MCG/2ML IJ SOLN
INTRAMUSCULAR | Status: DC | PRN
  Administered 2019-02-23: 25 ug via INTRAVENOUS

## 2019-02-23 MED ORDER — PHENYLEPHRINE 40 MCG/ML (10ML) SYRINGE FOR IV PUSH (FOR BLOOD PRESSURE SUPPORT)
PREFILLED_SYRINGE | INTRAVENOUS | Status: DC | PRN
  Administered 2019-02-23: 120 ug via INTRAVENOUS
  Administered 2019-02-23: 40 ug via INTRAVENOUS
  Administered 2019-02-23 (×4): 80 ug via INTRAVENOUS
  Administered 2019-02-23: 120 ug via INTRAVENOUS
  Administered 2019-02-23: 40 ug via INTRAVENOUS

## 2019-02-23 MED ORDER — LACTATED RINGERS IV SOLN
INTRAVENOUS | Status: DC
  Administered 2019-02-23 (×2): via INTRAVENOUS

## 2019-02-23 MED ORDER — ONDANSETRON HCL 4 MG/2ML IJ SOLN: 4.0000 mg | Freq: Once | INTRAMUSCULAR | Status: DC | PRN

## 2019-02-23 MED ORDER — ACETAMINOPHEN 500 MG PO TABS
ORAL_TABLET | ORAL | Status: AC
  Filled 2019-02-23: qty 2

## 2019-02-23 MED ORDER — PHENYLEPHRINE 40 MCG/ML (10ML) SYRINGE FOR IV PUSH (FOR BLOOD PRESSURE SUPPORT)
PREFILLED_SYRINGE | INTRAVENOUS | Status: AC
  Filled 2019-02-23: qty 30

## 2019-02-23 MED ORDER — LIDOCAINE HCL (CARDIAC) PF 100 MG/5ML IV SOSY
PREFILLED_SYRINGE | INTRAVENOUS | Status: DC | PRN
  Administered 2019-02-23: 100 mg via INTRAVENOUS

## 2019-02-23 MED ORDER — EPHEDRINE SULFATE-NACL 50-0.9 MG/10ML-% IV SOSY
PREFILLED_SYRINGE | INTRAVENOUS | Status: DC | PRN
  Administered 2019-02-23 (×3): 10 mg via INTRAVENOUS

## 2019-02-23 MED ORDER — FENTANYL CITRATE (PF) 100 MCG/2ML IJ SOLN
50.0000 ug | INTRAMUSCULAR | Status: DC | PRN
  Administered 2019-02-23: 12:00:00 50 ug via INTRAVENOUS

## 2019-02-23 MED ORDER — FENTANYL CITRATE (PF) 100 MCG/2ML IJ SOLN
INTRAMUSCULAR | Status: AC
  Filled 2019-02-23: qty 2

## 2019-02-23 MED ORDER — PROPOFOL 10 MG/ML IV BOLUS
INTRAVENOUS | Status: AC
  Filled 2019-02-23: qty 20

## 2019-02-23 MED ORDER — OXYCODONE HCL 5 MG PO TABS
ORAL_TABLET | ORAL | Status: AC
  Filled 2019-02-23: qty 1

## 2019-02-23 MED ORDER — BUPIVACAINE HCL (PF) 0.25 % IJ SOLN
INTRAMUSCULAR | Status: DC | PRN
  Administered 2019-02-23: 5 mL

## 2019-02-23 MED ORDER — OXYCODONE HCL 5 MG PO TABS
5.0000 mg | ORAL_TABLET | Freq: Once | ORAL | Status: AC
  Administered 2019-02-23: 5 mg via ORAL

## 2019-02-23 MED ORDER — ACETAMINOPHEN 500 MG PO TABS
1000.0000 mg | ORAL_TABLET | Freq: Once | ORAL | Status: AC
  Administered 2019-02-23: 11:00:00 1000 mg via ORAL

## 2019-02-23 MED ORDER — ROPIVACAINE HCL 5 MG/ML IJ SOLN
INTRAMUSCULAR | Status: DC | PRN
  Administered 2019-02-23: 30 mL

## 2019-02-23 MED ORDER — ACETAMINOPHEN 10 MG/ML IV SOLN: 1000.0000 mg | Freq: Once | INTRAVENOUS | Status: DC | PRN

## 2019-02-23 MED ORDER — PROPOFOL 10 MG/ML IV BOLUS
INTRAVENOUS | Status: DC | PRN
  Administered 2019-02-23: 120 mg via INTRAVENOUS

## 2019-02-23 MED ORDER — GABAPENTIN 300 MG PO CAPS
ORAL_CAPSULE | ORAL | Status: AC
  Filled 2019-02-23: qty 1

## 2019-02-23 MED ORDER — DEXAMETHASONE SODIUM PHOSPHATE 10 MG/ML IJ SOLN
INTRAMUSCULAR | Status: AC
  Filled 2019-02-23: qty 1

## 2019-02-23 MED ORDER — CEFAZOLIN SODIUM-DEXTROSE 2-4 GM/100ML-% IV SOLN
INTRAVENOUS | Status: AC
  Filled 2019-02-23: qty 100

## 2019-02-23 SURGICAL SUPPLY — 49 items
BLADE CLIPPER SURG (BLADE) ×2 IMPLANT
BLADE SURG 15 STRL LF DISP TIS (BLADE) ×1 IMPLANT
BLADE SURG 15 STRL SS (BLADE) ×1
CANISTER SUCT 1200ML W/VALVE (MISCELLANEOUS) IMPLANT
CHLORAPREP W/TINT 26 (MISCELLANEOUS) ×2 IMPLANT
COVER BACK TABLE REUSABLE LG (DRAPES) ×2 IMPLANT
COVER MAYO STAND REUSABLE (DRAPES) ×2 IMPLANT
COVER WAND RF STERILE (DRAPES) IMPLANT
DECANTER SPIKE VIAL GLASS SM (MISCELLANEOUS) IMPLANT
DERMABOND ADVANCED (GAUZE/BANDAGES/DRESSINGS) ×1
DERMABOND ADVANCED .7 DNX12 (GAUZE/BANDAGES/DRESSINGS) ×1 IMPLANT
DRAIN PENROSE 1/2X12 LTX STRL (WOUND CARE) ×2 IMPLANT
DRAPE LAPAROTOMY TRNSV 102X78 (DRAPES) ×2 IMPLANT
DRAPE UTILITY XL STRL (DRAPES) ×2 IMPLANT
ELECT COATED BLADE 2.86 ST (ELECTRODE) ×2 IMPLANT
ELECT REM PT RETURN 9FT ADLT (ELECTROSURGICAL) ×2
ELECTRODE REM PT RTRN 9FT ADLT (ELECTROSURGICAL) ×1 IMPLANT
GAUZE 4X4 16PLY RFD (DISPOSABLE) IMPLANT
GAUZE SPONGE 4X4 12PLY STRL LF (GAUZE/BANDAGES/DRESSINGS) IMPLANT
GLOVE BIO SURGEON STRL SZ 6.5 (GLOVE) ×4 IMPLANT
GLOVE BIOGEL PI IND STRL 7.0 (GLOVE) ×2 IMPLANT
GLOVE BIOGEL PI IND STRL 8 (GLOVE) ×1 IMPLANT
GLOVE BIOGEL PI INDICATOR 7.0 (GLOVE) ×2
GLOVE BIOGEL PI INDICATOR 8 (GLOVE) ×1
GLOVE ECLIPSE 8.0 STRL XLNG CF (GLOVE) ×2 IMPLANT
GOWN STRL REUS W/ TWL LRG LVL3 (GOWN DISPOSABLE) ×3 IMPLANT
GOWN STRL REUS W/TWL LRG LVL3 (GOWN DISPOSABLE) ×3
MESH HERNIA SYS ULTRAPRO LRG (Mesh General) ×2 IMPLANT
NEEDLE HYPO 25X1 1.5 SAFETY (NEEDLE) ×2 IMPLANT
NS IRRIG 1000ML POUR BTL (IV SOLUTION) ×2 IMPLANT
PACK BASIN DAY SURGERY FS (CUSTOM PROCEDURE TRAY) ×2 IMPLANT
PENCIL SMOKE EVACUATOR (MISCELLANEOUS) ×2 IMPLANT
SLEEVE SCD COMPRESS KNEE MED (MISCELLANEOUS) ×2 IMPLANT
SPONGE LAP 4X18 RFD (DISPOSABLE) ×2 IMPLANT
STRIP CLOSURE SKIN 1/2X4 (GAUZE/BANDAGES/DRESSINGS) IMPLANT
SUT MON AB 4-0 PC3 18 (SUTURE) ×2 IMPLANT
SUT NOVA 0 T19/GS 22DT (SUTURE) IMPLANT
SUT NOVA NAB DX-16 0-1 5-0 T12 (SUTURE) ×4 IMPLANT
SUT VIC AB 2-0 SH 27 (SUTURE) ×1
SUT VIC AB 2-0 SH 27XBRD (SUTURE) ×1 IMPLANT
SUT VIC AB 3-0 54X BRD REEL (SUTURE) IMPLANT
SUT VIC AB 3-0 BRD 54 (SUTURE)
SUT VICRYL 3-0 CR8 SH (SUTURE) ×2 IMPLANT
SUT VICRYL AB 2 0 TIE (SUTURE) IMPLANT
SUT VICRYL AB 2 0 TIES (SUTURE)
SYR CONTROL 10ML LL (SYRINGE) ×2 IMPLANT
TOWEL GREEN STERILE FF (TOWEL DISPOSABLE) ×2 IMPLANT
TUBE CONNECTING 20X1/4 (TUBING) IMPLANT
YANKAUER SUCT BULB TIP NO VENT (SUCTIONS) IMPLANT

## 2019-02-23 NOTE — Transfer of Care (Signed)
Immediate Anesthesia Transfer of Care Note  Patient: George [name]  Procedure(s) Performed: RIGHT INGUINAL HERNIA REPAIR WITH MESH (Right Groin)  Patient Location: PACU  Anesthesia Type:GA combined with regional for post-op pain  Level of Consciousness: drowsy and patient cooperative  Airway & Oxygen Therapy: Patient Spontanous Breathing and Patient connected to nasal cannula oxygen  Post-op Assessment: Report given to RN and Post -op Vital signs reviewed and stable  Post vital signs: Reviewed and stable  Last Vitals:  Vitals Value Taken Time  BP 124/62 02/23/19 1402  Temp    Pulse 58 02/23/19 1405  Resp 9 02/23/19 1405  SpO2 100 % 02/23/19 1405  Vitals shown include unvalidated device data.  Last Pain:  Vitals:   02/23/19 1109  TempSrc: Oral  PainSc: 0-No pain      Patients Stated Pain Goal: 5 ([id number])  Complications: No apparent anesthesia complications

## 2019-02-23 NOTE — Discharge Instructions (Signed)
[facility] _____[facility]  UMBILICAL OR INGUINAL HERNIA REPAIR: POST OP INSTRUCTIONS  Always review your discharge instruction sheet given to you by the facility where your surgery was performed. IF YOU HAVE DISABILITY OR FAMILY LEAVE FORMS, YOU MUST BRING THEM TO THE OFFICE FOR PROCESSING.   DO NOT GIVE THEM TO YOUR DOCTOR.  1. A  prescription for pain medication may be given to you upon discharge.  Take your pain medication as prescribed, if needed.  If narcotic pain medicine is not needed, then you may take acetaminophen (Tylenol) or ibuprofen (Advil) as needed. No Tylenol until 5:30pm 2. Take your usually prescribed medications unless otherwise directed. If you need a refill on your pain medication, please contact your pharmacy.  They will contact our office to request authorization. Prescriptions will not be filled after 5 pm or on week-ends. 3. You should follow a light diet the first 24 hours after arrival home, such as soup and crackers, etc.  Be sure to include lots of fluids daily.  Resume your normal diet the day after surgery. 4.Most patients will experience some swelling and bruising around the umbilicus or in the groin and scrotum.  Ice packs and reclining will help.  Swelling and bruising can take several days to resolve.  6. It is common to experience some constipation if taking pain medication after surgery.  Increasing fluid intake and taking a stool softener (such as Colace) will usually help or prevent this problem from occurring.  A mild laxative (Milk of Magnesia or Miralax) should be taken according to package directions if there are no bowel movements after 48 hours. 7. Unless discharge instructions indicate otherwise, you may remove your bandages 24-48 hours after surgery, and you may shower at that time.  You may have steri-strips (small skin tapes) in place directly over the incision.  These strips should be left on the skin for 7-10 days.  If your surgeon used  skin glue on the incision, you may shower in 24 hours.  The glue will flake off over the next 2-3 weeks.  Any sutures or staples will be removed at the office during your follow-up visit. 8. ACTIVITIES:  You may resume regular (light) daily activities beginning the next day--such as daily self-care, walking, climbing stairs--gradually increasing activities as tolerated.  You may have sexual intercourse when it is comfortable.  Refrain from any heavy lifting or straining until approved by your doctor.  a.You may drive when you are no longer taking prescription pain medication, you can comfortably wear a seatbelt, and you can safely maneuver your car and apply brakes. b.RETURN TO WORK:   _____________________________________________  9.You should see your doctor in the office for a follow-up appointment approximately 2-3 weeks after your surgery.  Make sure that you call for this appointment within a day or two after you arrive home to insure a convenient appointment time. 10.OTHER INSTRUCTIONS: _________________________    _____________________________________  WHEN TO CALL YOUR DOCTOR: 1. Fever over 101.0 2. Inability to urinate 3. Nausea and/or vomiting 4. Extreme swelling or bruising 5. Continued bleeding from incision. 6. Increased pain, redness, or drainage from the incision  The clinic staff is available to answer your questions during regular business hours.  Please dont hesitate to call and ask to speak to one of the nurses for clinical concerns.  If you have a medical emergency, go to the nearest emergency room or call 911.  A surgeon from [facility] is always on call at  the hospital   [address], [place], [place], [place]  [address] ?  [address], [place], [place]   [address] [phone] ? [phone] ? FAX [phone] Web site: [email or web address]    Post Anesthesia Home Care Instructions  Activity: Get plenty of rest for the  remainder of the day. A responsible individual must stay with you for 24 hours following the procedure.  For the next 24 hours, DO NOT: -Drive a car -Paediatric nurse -Drink alcoholic beverages -Take any medication unless instructed by your physician -Make any legal decisions or sign important papers.  Meals: Start with liquid foods such as gelatin or soup. Progress to regular foods as tolerated. Avoid greasy, spicy, heavy foods. If nausea and/or vomiting occur, drink only clear liquids until the nausea and/or vomiting subsides. Call your physician if vomiting continues.  Special Instructions/Symptoms: Your throat may feel dry or sore from the anesthesia or the breathing tube placed in your throat during surgery. If this causes discomfort, gargle with warm salt water. The discomfort should disappear within 24 hours.  If you had a scopolamine patch placed behind your ear for the management of post- operative nausea and/or vomiting:  1. The medication in the patch is effective for 72 hours, after which it should be removed.  Wrap patch in a tissue and discard in the trash. Wash hands thoroughly with soap and water. 2. You may remove the patch earlier than 72 hours if you experience unpleasant side effects which may include dry mouth, dizziness or visual disturbances. 3. Avoid touching the patch. Wash your hands with soap and water after contact with the patch.

## 2019-02-23 NOTE — Progress Notes (Signed)
Assisted Dr. Houser with right, ultrasound guided, transabdominal plane block. Side rails up, monitors on throughout procedure. See vital signs in flow sheet. Tolerated Procedure well.  

## 2019-02-23 NOTE — Anesthesia Preprocedure Evaluation (Addendum)
Anesthesia Evaluation  Patient identified by MRN, date of birth, ID band Patient awake    Reviewed: Allergy & Precautions, NPO status , Patient's Chart, lab work & pertinent test results  Airway Mallampati: II  TM Distance: >3 FB Neck ROM: Full    Dental no notable dental hx. (+) Teeth Intact, Dental Advisory Given   Pulmonary neg pulmonary ROS,    Pulmonary exam normal breath sounds clear to auscultation       Cardiovascular Exercise Tolerance: Good hypertension, Pt. on medications + CAD  Normal cardiovascular exam+ dysrhythmias + pacemaker  Rhythm:Regular Rate:Normal  Echo Ef 50-55% / 2018 EKG Av Paced   Neuro/Psych negative neurological ROS     GI/Hepatic Neg liver ROS,   Endo/Other    Renal/GU K+ 4.2     Musculoskeletal  (+) Arthritis ,   Abdominal   Peds  Hematology Hgb 13.8   Anesthesia Other Findings   Reproductive/Obstetrics                            Anesthesia Physical Anesthesia Plan  ASA: III  Anesthesia Plan: General   Post-op Pain Management:  Regional for Post-op pain   Induction: Intravenous  PONV Risk Score and Plan: 3 and Treatment may vary due to age or medical condition, Ondansetron and Midazolam  Airway Management Planned: LMA  Additional Equipment: None  Intra-op Plan:   Post-operative Plan: Extubation in OR  Informed Consent: I have reviewed the patients History and Physical, chart, labs and discussed the procedure including the risks, benefits and alternatives for the proposed anesthesia with the patient or authorized representative who has indicated his/her understanding and acceptance.     Dental advisory given  Plan Discussed with: CRNA  Anesthesia Plan Comments: (Ga w R TAP block)       Anesthesia Quick Evaluation

## 2019-02-23 NOTE — Op Note (Signed)
Right inguinal Hernia repair with mesh , Open, Procedure Note  Indications: The patient presented with a history of a right, reducible  Inguinal hernia.  The risk of hernia repair include bleeding,  Infection,   Recurrence of the hernia,  Mesh use, chronic pain,  Organ injury,  Bowel injury,  Bladder injury,   nerve injury with numbness around the incision,  Testicle injury / loss Death,  and worsening of preexisting  medical problems.  The alternatives to surgery have been discussed as well..  Long term expectations of both operative and non operative treatments have been discussed.   The patient agrees to proceed.   Pre-operative Diagnosis: right reducible inguinal hernia   Post-operative Diagnosis: same  Surgeon: Turner Daniels MD  Assistants: OR staff  Anesthesia: General endotracheal anesthesia and TAP block and  local   ASA Class: 3  Procedure Details  The patient was seen again in the Holding Room. The risks, benefits, complications, treatment options, and expected outcomes were discussed with the patient. The possibilities of reaction to medication, pulmonary aspiration, perforation of viscus, bleeding, recurrent infection, the need for additional procedures, and development of a complication requiring transfusion or further operation were discussed with the patient and/or family. There was concurrence with the proposed plan, and informed consent was obtained. The site of surgery was properly noted/marked. The patient was taken to the Operating Room, identified as ATOM HERRERA, and the procedure verified as hernia repair. A Time Out was held and the above information confirmed.  The patient was placed in the supine position and underwent induction of anesthesia, the lower abdomen and groin was prepped and draped in the standard fashion, and 0.25% Marcaine with epinephrine was used to anesthetize the skin over the mid-portion of the inguinal canal. A transverse incision was made.  Dissection was carried through the soft tissue to expose the inguinal canal and inguinal ligament along its lower edge. The external oblique fascia was split along the course of its fibers, exposing the inguinal canal. The cord and nerve were looped using a Penrose drain and reflected out of the field. The defect was exposed and a piece of prolene hernia system ultrapro mesh was and placed into the indirect defect after reduction of the hernia sac and a large lipoma into  the defect. Interupted 2-0 novafil suture was then used  to repair the defect, with the suture being sewn from the pubic tubercle inferiorly and superiorly along the canal to a level just beyond the internal ring. The mesh was split to allow passage of the cord and nerve into the canal without entrapment. The internal ring was snug but I could allow my fifth digit through it and the cord was not constricted.  The contents were then returned to canal and the external oblique fashion was then closed in a continuous fashion using 3-0 Vicryl suture taking care not to cause entrapment. Scarpa's layer closed with 3 0 vicryl and 4 0 monocryl used to close the skin.  Dermabond used for dressing.  Instrument, sponge, and needle counts were correct prior to closure and at the conclusion of the case.  Findings: Hernia as above  Estimated Blood Loss: Minimal         Drains: None         Total IV Fluids: per record         Specimens: none                Complications: None; patient tolerated the  procedure well.         Disposition: PACU - hemodynamically stable.         Condition: stable

## 2019-02-23 NOTE — Anesthesia Postprocedure Evaluation (Signed)
Anesthesia Post Note  Patient: George [name]  Procedure(s) Performed: RIGHT INGUINAL HERNIA REPAIR WITH MESH (Right Groin)     Patient location during evaluation: PACU Anesthesia Type: General Level of consciousness: awake and alert Pain management: pain level controlled Vital Signs Assessment: post-procedure vital signs reviewed and stable Respiratory status: spontaneous breathing, nonlabored ventilation, respiratory function stable and patient connected to nasal cannula oxygen Cardiovascular status: blood pressure returned to baseline and stable Postop Assessment: no apparent nausea or vomiting Anesthetic complications: no    Last Vitals:  Vitals:   02/23/19 1430 02/23/19 1528  BP: (!) 149/87 (!) 145/89  Pulse: 65 (!) 59  Resp: 15 20  Temp:  36.6 C  SpO2: 99% 95%    Last Pain:  Vitals:   02/23/19 1528  TempSrc: Oral  PainSc: 4                  Barnet Glasgow

## 2019-02-23 NOTE — Anesthesia Procedure Notes (Signed)
Anesthesia Regional Block: TAP block   Pre-Anesthetic Checklist: ,, timeout performed, Correct Patient, Correct Site, Correct Laterality, Correct Procedure, Correct Position, site marked, Risks and benefits discussed, Surgical consent,  At surgeon's request and post-op pain management  Laterality: Right  Prep: Maximum Sterile Barrier Precautions used, chloraprep       Needles:  Injection technique: Single-shot  Needle Type: Echogenic Needle     Needle Length: 9cm  Needle Gauge: 18     Additional Needles:   Procedures: Doppler guided,,,,,,,,  Narrative:  Start time: 02/23/2019 11:33 AM End time: 02/23/2019 11:41 AM  Performed by: Personally  Anesthesiologist: Barnet Glasgow, MD

## 2019-02-23 NOTE — Interval H&P Note (Signed)
History and Physical Interval Note:  02/23/2019 12:07 PM  George [name]  has presented today for surgery, with the diagnosis of RIGHT INGUINAL HERNIA.  The various methods of treatment have been discussed with the patient and family. After consideration of risks, benefits and other options for treatment, the patient has consented to  Procedure(s) with comments: [place] (Right) - TAP BLOCK ANESTHESIA AS WELL as a surgical intervention.  The patient's history has been reviewed, patient examined, no change in status, stable for surgery.  I have reviewed the patient's chart and labs.  Questions were answered to the patient's satisfaction.     [place]

## 2019-02-24 ENCOUNTER — Encounter (HOSPITAL_BASED_OUTPATIENT_CLINIC_OR_DEPARTMENT_OTHER): Payer: Self-pay | Admitting: Surgery

## 2019-03-16 DIAGNOSIS — M25611 Stiffness of right shoulder, not elsewhere classified: Secondary | ICD-10-CM | POA: Diagnosis not present

## 2019-03-16 DIAGNOSIS — R6889 Other general symptoms and signs: Secondary | ICD-10-CM | POA: Diagnosis not present

## 2019-03-16 DIAGNOSIS — Z9889 Other specified postprocedural states: Secondary | ICD-10-CM | POA: Diagnosis not present

## 2019-03-16 DIAGNOSIS — G8929 Other chronic pain: Secondary | ICD-10-CM | POA: Diagnosis not present

## 2019-03-16 DIAGNOSIS — M25511 Pain in right shoulder: Secondary | ICD-10-CM | POA: Diagnosis not present

## 2019-04-28 ENCOUNTER — Ambulatory Visit (INDEPENDENT_AMBULATORY_CARE_PROVIDER_SITE_OTHER): Payer: Medicare HMO | Admitting: *Deleted

## 2019-04-28 DIAGNOSIS — I472 Ventricular tachycardia, unspecified: Secondary | ICD-10-CM

## 2019-04-28 LAB — CUP PACEART REMOTE DEVICE CHECK
Battery Remaining Longevity: 94 mo
Battery Voltage: 2.98 V
Brady Statistic AP VP Percent: 28.38 %
Brady Statistic AP VS Percent: 0.03 %
Brady Statistic AS VP Percent: 71.16 %
Brady Statistic AS VS Percent: 0.43 %
Brady Statistic RA Percent Paced: 28.56 %
Brady Statistic RV Percent Paced: 99.54 %
Date Time Interrogation Session: 20210209200128
Implantable Lead Implant Date: 20170802
Implantable Lead Implant Date: 20170802
Implantable Lead Implant Date: 20170802
Implantable Lead Location: 753858
Implantable Lead Location: 753859
Implantable Lead Location: 753860
Implantable Lead Model: 5076
Implantable Lead Model: 5076
Implantable Pulse Generator Implant Date: 20170802
Lead Channel Impedance Value: 1045 Ohm
Lead Channel Impedance Value: 1102 Ohm
Lead Channel Impedance Value: 1121 Ohm
Lead Channel Impedance Value: 342 Ohm
Lead Channel Impedance Value: 418 Ohm
Lead Channel Impedance Value: 456 Ohm
Lead Channel Impedance Value: 494 Ohm
Lead Channel Impedance Value: 513 Ohm
Lead Channel Impedance Value: 532 Ohm
Lead Channel Impedance Value: 551 Ohm
Lead Channel Impedance Value: 722 Ohm
Lead Channel Impedance Value: 798 Ohm
Lead Channel Impedance Value: 836 Ohm
Lead Channel Impedance Value: 931 Ohm
Lead Channel Pacing Threshold Amplitude: 0.75 V
Lead Channel Pacing Threshold Amplitude: 0.875 V
Lead Channel Pacing Threshold Amplitude: 1.125 V
Lead Channel Pacing Threshold Pulse Width: 0.4 ms
Lead Channel Pacing Threshold Pulse Width: 0.4 ms
Lead Channel Pacing Threshold Pulse Width: 0.8 ms
Lead Channel Sensing Intrinsic Amplitude: 2.125 mV
Lead Channel Sensing Intrinsic Amplitude: 2.125 mV
Lead Channel Sensing Intrinsic Amplitude: 8.125 mV
Lead Channel Sensing Intrinsic Amplitude: 8.125 mV
Lead Channel Setting Pacing Amplitude: 1.75 V
Lead Channel Setting Pacing Amplitude: 1.75 V
Lead Channel Setting Pacing Amplitude: 2.5 V
Lead Channel Setting Pacing Pulse Width: 0.4 ms
Lead Channel Setting Pacing Pulse Width: 0.8 ms
Lead Channel Setting Sensing Sensitivity: 2.8 mV

## 2019-04-28 NOTE — Progress Notes (Signed)
PPM Remote  

## 2019-05-19 ENCOUNTER — Other Ambulatory Visit: Payer: Self-pay | Admitting: Internal Medicine

## 2019-05-25 ENCOUNTER — Other Ambulatory Visit: Payer: Self-pay | Admitting: Family Medicine

## 2019-05-25 DIAGNOSIS — J3089 Other allergic rhinitis: Secondary | ICD-10-CM

## 2019-05-28 ENCOUNTER — Telehealth: Payer: Self-pay

## 2019-05-28 ENCOUNTER — Other Ambulatory Visit: Payer: Self-pay | Admitting: Family Medicine

## 2019-05-28 DIAGNOSIS — J3089 Other allergic rhinitis: Secondary | ICD-10-CM

## 2019-05-28 NOTE — Telephone Encounter (Signed)
Please call pt to schedule appt.  No further refills until pt is seen.  T. Zailey Audia, CMA  

## 2019-06-01 ENCOUNTER — Other Ambulatory Visit: Payer: Self-pay | Admitting: Family Medicine

## 2019-06-01 DIAGNOSIS — L259 Unspecified contact dermatitis, unspecified cause: Secondary | ICD-10-CM

## 2019-06-07 ENCOUNTER — Other Ambulatory Visit: Payer: Self-pay | Admitting: Internal Medicine

## 2019-06-07 ENCOUNTER — Other Ambulatory Visit: Payer: Self-pay | Admitting: Family Medicine

## 2019-06-07 DIAGNOSIS — E785 Hyperlipidemia, unspecified: Secondary | ICD-10-CM

## 2019-06-07 DIAGNOSIS — J3089 Other allergic rhinitis: Secondary | ICD-10-CM

## 2019-06-07 MED ORDER — ATORVASTATIN CALCIUM 40 MG PO TABS: 40.0000 mg | ORAL_TABLET | Freq: Every day | ORAL | 2 refills | Status: DC

## 2019-07-07 ENCOUNTER — Other Ambulatory Visit: Payer: Self-pay

## 2019-07-07 ENCOUNTER — Telehealth (INDEPENDENT_AMBULATORY_CARE_PROVIDER_SITE_OTHER): Payer: Medicare HMO | Admitting: Physician Assistant

## 2019-07-07 ENCOUNTER — Encounter: Payer: Self-pay | Admitting: Physician Assistant

## 2019-07-07 VITALS — BP 133/85 | HR 59 | Temp 97.4°F | Ht 68.0 in | Wt 164.0 lb

## 2019-07-07 DIAGNOSIS — J3089 Other allergic rhinitis: Secondary | ICD-10-CM

## 2019-07-07 DIAGNOSIS — I10 Essential (primary) hypertension: Secondary | ICD-10-CM | POA: Diagnosis not present

## 2019-07-07 DIAGNOSIS — I2510 Atherosclerotic heart disease of native coronary artery without angina pectoris: Secondary | ICD-10-CM

## 2019-07-07 DIAGNOSIS — E785 Hyperlipidemia, unspecified: Secondary | ICD-10-CM

## 2019-07-07 DIAGNOSIS — J302 Other seasonal allergic rhinitis: Secondary | ICD-10-CM

## 2019-07-07 MED ORDER — FEXOFENADINE HCL 180 MG PO TABS: 180.0000 mg | ORAL_TABLET | Freq: Every day | ORAL | 0 refills | Status: DC

## 2019-07-07 MED ORDER — FLUTICASONE PROPIONATE 50 MCG/ACT NA SUSP: NASAL | 0 refills | Status: DC

## 2019-07-07 NOTE — Progress Notes (Signed)
Telehealth office visit note for George Reid, PA-C- at Primary Care at [facility]   I connected with current patient today and verified that I am speaking with the correct person   . Location of the patient: Work . Location of the provider: Office - This visit type was conducted due to national recommendations for restrictions regarding the COVID-19 Pandemic (e.g. social distancing) in an effort to limit this patient's exposure and mitigate transmission in our community.    - No physical exam could be performed with this format, beyond that communicated to [place] by the patient/ family members as noted.   - Additionally my office staff/ schedulers were to discuss with the patient that there may be a monetary charge related to this service, depending on their medical insurance.  My understanding is that patient understood and consented to proceed.     _________________________________________________________________________________   History of Present Illness: Patient calls in requesting refills on allergy medications. He has a runny nose mostly in the mornings. Patient's last chronic appointment with PCP was 06/23/18. States has been doing fine and has no other concerns.   HTN and HDL are managed by his cardiologist- Dr. Caryl Comes, and states he spoke to him a "while ago" and has an upcoming appointment to get his pacemaker checked around 1st of May.  HTN: Pt denies chest pain, palpitations, dizziness, shortness of breath, or headache. Taking medications as directed with no problems. States his cardiologist, Dr. Caryl Comes, adjusted his Cozaar and is really helping with his BP. Checks his BP once/wk or when not feeling well. They range in 130s/80s.   HDL: Pt reports compliance with medication. Denies side effects. States he's been busy. He's outside working all day and gets "exercise that way." But he also does some walking.   No flowsheet data found.  Depression screen [facility] 2/9 01/14/2019 06/23/2018  05/26/2018 06/05/2017 05/13/2017  Decreased Interest 0 0 0 0 0  Down, Depressed, Hopeless 0 0 0 0 0  PHQ - 2 Score 0 0 0 0 0  Altered sleeping 0 0 0 0 0  Tired, decreased energy 1 0 0 0 1  Change in appetite 0 0 0 0 0  Feeling bad or failure about yourself  0 0 0 0 0  Trouble concentrating 0 0 0 0 0  Moving slowly or fidgety/restless 0 0 0 0 0  Suicidal thoughts 0 0 0 0 0  PHQ-9 Score 1 0 0 0 1  Difficult doing work/chores Not difficult at all Not difficult at all - Not difficult at all Not difficult at all      Impression and Recommendations:     1. Essential hypertension   2. Hyperlipidemia LDL goal <100   3. Coronary artery disease involving native coronary artery of native heart without angina pectoris   4. Seasonal allergic rhinitis, unspecified trigger   5. Environmental and seasonal allergies     HTN: - BP today is 133/85, stable so continue medications. - Continue to follow-up with cardiology. - Importance of ambulatory blood pressure monitoring d/c pt. - DASH diet encouraged as well.  - Encouraged to stay as active as possible and to continue to   take 10-15 minute walks daily. Increase exercise gradually as tolearted.  HDL, CAD: - Continue Atorvastatin. - Counseling diet and exercise. - Continue to follow-up with cardiology.  - Recommended to schedule annual wellness and fasting blood work.   Seasonal allergies, allergic rhinitis: - Refilled Allegra and Flonase.   - As part  of my medical decision making, I reviewed the following data within the [place] History obtained from pt /family, CMA notes reviewed and incorporated if applicable, Labs reviewed, Radiograph/ tests reviewed if applicable and OV notes from prior OV's with me, as well as any other specialists she/he has seen since seeing me last, were all reviewed and used in my medical decision making process today.    - Additionally, when appropriate, discussion had with patient regarding our  treatment plan, and their biases/concerns about that plan were used in my medical decision making today.    - The patient agreed with the plan and demonstrated an understanding of the instructions. No barriers to understanding were identified.     - The patient was advised to call back or seek an in-person evaluation if the symptoms worsen or if the condition fails to improve as anticipated.   Return for CPE in 4-6 months with PCP and FBW.    No orders of the defined types were placed in this encounter.   Meds ordered this encounter  Medications  . fexofenadine (ALLEGRA) 180 MG tablet    Sig: Take 1 tablet (180 mg total) by mouth daily. During allergy season    Dispense:  180 tablet    Refill:  0  . fluticasone (FLONASE) 50 MCG/ACT nasal spray    Sig: Use 1 spray in each nostril after sinus rinses twice daily.    Dispense:  16 g    Refill:  0    Medications Discontinued During This Encounter  Medication Reason  . hydrocortisone cream 1 % Completed Course  . oxyCODONE (OXY IR/ROXICODONE) 5 MG immediate release tablet No longer needed (for PRN medications)  . fexofenadine (ALLEGRA) 180 MG tablet Reorder  . fluticasone (FLONASE) 50 MCG/ACT nasal spray Reorder       Time spent on visit including pre-visit chart review and post-visit care was 15 minutes.       The [place] was signed into law in 2016 which includes the topic of electronic health records.  This provides immediate access to information in MyChart.  This includes consultation notes, operative notes, office notes, lab results and pathology reports.  If you have any questions about what you read please let us know at your next visit or call us at the office.  We are right here with you.   __________________________________________________________________________________     Patient Care Team    Relationship Specialty Notifications Start End  Mellody Dance, DO PCP - General Family Medicine   09/14/15   [name], MD PCP - Cardiology Cardiology Admissions 12/26/17   Wonda Horner, MD Consulting Physician Gastroenterology  11/12/16   Allyn Kenner, MD Consulting Physician Dermatology  11/13/16   Pa, [place] Physician Optometry  11/13/16   [name], MD Consulting Physician Cardiology  05/13/17    Comment: PM placement and EP     -Vitals obtained; medications/ allergies reconciled;  personal medical, social, Sx etc.histories were updated by CMA, reviewed by me and are reflected in chart   Patient Active Problem List   Diagnosis Date Noted  . Rotator cuff tear arthropathy of right shoulder 01/14/2019  . Impingement syndrome of right shoulder 01/14/2019  . VT (ventricular tachycardia) ([place]) 11/12/2018  . Environmental and seasonal allergies 06/09/2018  . Senile solar keratosis 05/26/2018  . Contact dermatitis 05/26/2018  . Eczema of eyelid 05/26/2018  . Inguinal hernia of right side without obstruction or gangrene 05/26/2018  .  CAD (coronary artery disease), native coronary artery 01/30/2018  . Coronary artery aneurysm 01/30/2018  . Other male erectile dysfunction- "hx of fractured penis requiring sx" 05/20/2017  . Hyperlipidemia LDL goal <100 05/13/2017  . Dizziness 08/21/2016  . BBBB (bilateral bundle branch block) 10/18/2015  . Complete heart block ([place]) 10/18/2015  . Vitamin B12 deficiency 10/06/2015  . Vitamin D insufficiency 10/06/2015  . Bradycardia 10/06/2015  . Hereditary hyperbilirubinemia 10/06/2015  . H/O noncompliance with medical treatment, presenting hazards to health 09/19/2015  . Peyronie's disease 09/19/2015  . DOE (dyspnea on exertion) 09/19/2015  . Unexplained weight loss 09/14/2015  . Essential hypertension 09/14/2015  . h/o Non-compliance: Has not seen a doc in over 13 years 09/14/2015  . Fatigue 09/14/2015  . History of nonadherence to medical treatment 09/14/2015     Current Meds  Medication Sig  . atorvastatin  (LIPITOR) 40 MG tablet Take 1 tablet (40 mg total) by mouth daily at 6 PM.  . carvedilol (COREG) 25 MG tablet TAKE 1 TABLET BY MOUTH TWICE A DAY WITH MEALS  . Cholecalciferol (VITAMIN D3) 5000 units TABS 5,000 IU OTC vitamin D3 daily.  . clotrimazole-betamethasone (LOTRISONE) cream Apply 1 application topically 2 (two) times daily.  . fexofenadine (ALLEGRA) 180 MG tablet Take 1 tablet (180 mg total) by mouth daily. During allergy season  . fluticasone (FLONASE) 50 MCG/ACT nasal spray Use 1 spray in each nostril after sinus rinses twice daily.  Marland Kitchen losartan (COZAAR) 25 MG tablet TAKE 1 TABLET BY MOUTH EVERY DAY  . Magnesium 250 MG TABS Take 250 mg by mouth daily.  . sildenafil (REVATIO) 20 MG tablet Take 20-100 mg by mouth daily as needed.  . vitamin B-12 (CYANOCOBALAMIN) 1000 MCG tablet Take 1,000 mcg by mouth daily.  . Zinc 50 MG TABS Take by mouth.  . [DISCONTINUED] fexofenadine (ALLEGRA) 180 MG tablet Take 1 tablet (180 mg total) by mouth daily. During allergy season**PATIENT NEEDS APT FOR FURTHER REFILLS**  . [DISCONTINUED] fluticasone (FLONASE) 50 MCG/ACT nasal spray USE 1 SPRAY IN EACH NOSTRIL AFTER SINUS RINSES TWICE DAILY     Allergies:  No Known Allergies   ROS:  See above HPI for pertinent positives and negatives   Objective:   Blood pressure 133/85, pulse (!) 59, temperature (!) 97.4 F (36.3 C), temperature source Oral, height 5\' 8"  (1.727 m), weight 164 lb (74.4 kg).  (if some vitals are omitted, this means that patient was UNABLE to obtain them even though they were asked to get them prior to OV today.  They were asked to call us at their earliest convenience with these once obtained. ) General: A & O * 3; sounds in no acute distress; in usual state of health.  Skin: Pt confirms warm and dry extremities and pink fingertips HEENT: Pt confirms lips non-cyanotic Chest: Patient confirms normal chest excursion and movement Respiratory: speaking in full sentences, no  conversational dyspnea; patient confirms no use of accessory muscles Psych: insight appears good, mood- appears full

## 2019-07-19 DIAGNOSIS — R69 Illness, unspecified: Secondary | ICD-10-CM | POA: Diagnosis not present

## 2019-07-28 ENCOUNTER — Ambulatory Visit (INDEPENDENT_AMBULATORY_CARE_PROVIDER_SITE_OTHER): Payer: Medicare HMO | Admitting: *Deleted

## 2019-07-28 DIAGNOSIS — I442 Atrioventricular block, complete: Secondary | ICD-10-CM | POA: Diagnosis not present

## 2019-07-28 LAB — CUP PACEART REMOTE DEVICE CHECK
Battery Remaining Longevity: 85 mo
Battery Voltage: 2.98 V
Brady Statistic AP VP Percent: 34.75 %
Brady Statistic AP VS Percent: 0.04 %
Brady Statistic AS VP Percent: 65.08 %
Brady Statistic AS VS Percent: 0.14 %
Brady Statistic RA Percent Paced: 34.66 %
Brady Statistic RV Percent Paced: 99.82 %
Date Time Interrogation Session: 20210511211143
Implantable Lead Implant Date: 20170802
Implantable Lead Implant Date: 20170802
Implantable Lead Implant Date: 20170802
Implantable Lead Location: 753858
Implantable Lead Location: 753859
Implantable Lead Location: 753860
Implantable Lead Model: 5076
Implantable Lead Model: 5076
Implantable Pulse Generator Implant Date: 20170802
Lead Channel Impedance Value: 1026 Ohm
Lead Channel Impedance Value: 1102 Ohm
Lead Channel Impedance Value: 1102 Ohm
Lead Channel Impedance Value: 342 Ohm
Lead Channel Impedance Value: 380 Ohm
Lead Channel Impedance Value: 418 Ohm
Lead Channel Impedance Value: 456 Ohm
Lead Channel Impedance Value: 494 Ohm
Lead Channel Impedance Value: 513 Ohm
Lead Channel Impedance Value: 551 Ohm
Lead Channel Impedance Value: 703 Ohm
Lead Channel Impedance Value: 779 Ohm
Lead Channel Impedance Value: 779 Ohm
Lead Channel Impedance Value: 912 Ohm
Lead Channel Pacing Threshold Amplitude: 0.75 V
Lead Channel Pacing Threshold Amplitude: 0.875 V
Lead Channel Pacing Threshold Amplitude: 1.375 V
Lead Channel Pacing Threshold Pulse Width: 0.4 ms
Lead Channel Pacing Threshold Pulse Width: 0.4 ms
Lead Channel Pacing Threshold Pulse Width: 0.8 ms
Lead Channel Sensing Intrinsic Amplitude: 1.5 mV
Lead Channel Sensing Intrinsic Amplitude: 1.5 mV
Lead Channel Sensing Intrinsic Amplitude: 7.5 mV
Lead Channel Sensing Intrinsic Amplitude: 7.5 mV
Lead Channel Setting Pacing Amplitude: 1.75 V
Lead Channel Setting Pacing Amplitude: 2 V
Lead Channel Setting Pacing Amplitude: 2.5 V
Lead Channel Setting Pacing Pulse Width: 0.4 ms
Lead Channel Setting Pacing Pulse Width: 0.8 ms
Lead Channel Setting Sensing Sensitivity: 2.8 mV

## 2019-07-29 NOTE — Progress Notes (Signed)
Remote pacemaker transmission.   

## 2019-09-06 ENCOUNTER — Other Ambulatory Visit: Payer: Self-pay | Admitting: Physician Assistant

## 2019-09-06 DIAGNOSIS — J302 Other seasonal allergic rhinitis: Secondary | ICD-10-CM

## 2019-09-06 DIAGNOSIS — J3089 Other allergic rhinitis: Secondary | ICD-10-CM

## 2019-10-04 ENCOUNTER — Other Ambulatory Visit: Payer: Self-pay | Admitting: Physician Assistant

## 2019-10-04 DIAGNOSIS — J302 Other seasonal allergic rhinitis: Secondary | ICD-10-CM

## 2019-10-04 DIAGNOSIS — J3089 Other allergic rhinitis: Secondary | ICD-10-CM

## 2019-10-22 ENCOUNTER — Ambulatory Visit: Payer: Medicare HMO | Admitting: Student

## 2019-10-22 ENCOUNTER — Encounter: Payer: Self-pay | Admitting: Student

## 2019-10-22 ENCOUNTER — Other Ambulatory Visit: Payer: Self-pay

## 2019-10-22 VITALS — BP 132/78 | HR 68 | Ht 68.0 in | Wt 164.0 lb

## 2019-10-22 DIAGNOSIS — I10 Essential (primary) hypertension: Secondary | ICD-10-CM

## 2019-10-22 DIAGNOSIS — I471 Supraventricular tachycardia: Secondary | ICD-10-CM | POA: Diagnosis not present

## 2019-10-22 DIAGNOSIS — I442 Atrioventricular block, complete: Secondary | ICD-10-CM | POA: Diagnosis not present

## 2019-10-22 LAB — CUP PACEART INCLINIC DEVICE CHECK
Battery Remaining Longevity: 83 mo
Battery Voltage: 2.97 V
Brady Statistic AP VP Percent: 32.45 %
Brady Statistic AP VS Percent: 0.05 %
Brady Statistic AS VP Percent: 67.16 %
Brady Statistic AS VS Percent: 0.34 %
Brady Statistic RA Percent Paced: 32.57 %
Brady Statistic RV Percent Paced: 99.6 %
Date Time Interrogation Session: 20210806102231
Implantable Lead Implant Date: 20170802
Implantable Lead Implant Date: 20170802
Implantable Lead Implant Date: 20170802
Implantable Lead Location: 753858
Implantable Lead Location: 753859
Implantable Lead Location: 753860
Implantable Lead Model: 5076
Implantable Lead Model: 5076
Implantable Pulse Generator Implant Date: 20170802
Lead Channel Impedance Value: 1064 Ohm
Lead Channel Impedance Value: 1083 Ohm
Lead Channel Impedance Value: 342 Ohm
Lead Channel Impedance Value: 380 Ohm
Lead Channel Impedance Value: 418 Ohm
Lead Channel Impedance Value: 475 Ohm
Lead Channel Impedance Value: 513 Ohm
Lead Channel Impedance Value: 513 Ohm
Lead Channel Impedance Value: 551 Ohm
Lead Channel Impedance Value: 703 Ohm
Lead Channel Impedance Value: 760 Ohm
Lead Channel Impedance Value: 798 Ohm
Lead Channel Impedance Value: 893 Ohm
Lead Channel Impedance Value: 988 Ohm
Lead Channel Pacing Threshold Amplitude: 0.625 V
Lead Channel Pacing Threshold Amplitude: 1 V
Lead Channel Pacing Threshold Amplitude: 1.125 V
Lead Channel Pacing Threshold Pulse Width: 0.4 ms
Lead Channel Pacing Threshold Pulse Width: 0.4 ms
Lead Channel Pacing Threshold Pulse Width: 0.8 ms
Lead Channel Sensing Intrinsic Amplitude: 1.5 mV
Lead Channel Sensing Intrinsic Amplitude: 2.25 mV
Lead Channel Sensing Intrinsic Amplitude: 7.5 mV
Lead Channel Sensing Intrinsic Amplitude: 7.5 mV
Lead Channel Setting Pacing Amplitude: 1.5 V
Lead Channel Setting Pacing Amplitude: 1.75 V
Lead Channel Setting Pacing Amplitude: 2.5 V
Lead Channel Setting Pacing Pulse Width: 0.4 ms
Lead Channel Setting Pacing Pulse Width: 0.8 ms
Lead Channel Setting Sensing Sensitivity: 2.8 mV

## 2019-10-22 LAB — BASIC METABOLIC PANEL
BUN/Creatinine Ratio: 10 (ref 10–24)
BUN: 9 mg/dL (ref 8–27)
CO2: 26 mmol/L (ref 20–29)
Calcium: 9.8 mg/dL (ref 8.6–10.2)
Chloride: 102 mmol/L (ref 96–106)
Creatinine, Ser: 0.9 mg/dL (ref 0.76–1.27)
GFR calc Af Amer: 103 mL/min/{1.73_m2} (ref >59)
GFR calc non Af Amer: 89 mL/min/{1.73_m2} (ref >59)
Glucose: 97 mg/dL (ref 65–99)
Potassium: 3.9 mmol/L (ref 3.5–5.2)
Sodium: 139 mmol/L (ref 134–144)

## 2019-10-22 LAB — MAGNESIUM: Magnesium: 2.1 mg/dL (ref 1.6–2.3)

## 2019-10-22 NOTE — Progress Notes (Signed)
Electrophysiology Office Note Date: 10/22/2019  ID:  George [name], DOB [birth year], MRN [id number]  PCP: Lorrene Reid, PA-C Primary Cardiologist: Virl Axe, MD Electrophysiologist: Virl Axe, MD   CC: Pacemaker follow-up  George [name] is a 69 y.o. male seen today for Virl Axe, MD for routine electrophysiology followup.  Since last being seen in our clinic the patient reports doing very well. He has recovered well from R shoulder surgery.  he denies chest pain, palpitations, dyspnea, PND, orthopnea, nausea, vomiting, syncope, edema, weight gain, or early satiety. He has occasional lightheadedness but it is not marked or limiting. Occurs most often in hot weather when he feels like he is poorly hydrated.   Device History: Medtronic BiV PPM implanted 10/2015 for CHB and Bilateral BBB with cardiomyopathy  Past Medical History:  Diagnosis Date  . Arthritis THUMBS  . LBBB (left bundle branch block)    bilat. BBB- PPM  . Peyronie's disease    Past Surgical History:  Procedure Laterality Date  . CARDIOVASCULAR STRESS TEST  10-18-2002  DR  CRENSHAW   NORMAL ADENOSINE CARDIOLITE/ EF 60%  . CATARACT EXTRACTION W/ INTRAOCULAR LENS IMPLANT  2006   LEFT EYE  . EP IMPLANTABLE DEVICE N/A 10/18/2015   Procedure: BiV Pacemaker Insertion CRT-P;  Surgeon: [name], MD;  Location: [facility];  Service: Cardiovascular;  Laterality: N/A;  . INGUINAL HERNIA REPAIR Right 02/23/2019   Procedure: RIGHT INGUINAL HERNIA REPAIR WITH MESH;  Surgeon: Erroll Luna, MD;  Location: [facility];  Service: General;  Laterality: Right;  . LAPAROSCOPIC CHOLECYSTECTOMY  10-09-2007  . LEFT HEART CATH AND CORONARY ANGIOGRAPHY N/A 01/30/2018   Procedure: LEFT HEART CATH AND CORONARY ANGIOGRAPHY;  Surgeon: Belva Crome, MD;  Location: [facility];  Service: Cardiovascular;  Laterality: N/A;  . NESBIT PROCEDURE  01/17/2012   Procedure: NESBIT PROCEDURE;  Surgeon: [name], MD;  Location: [facility];  Service: Urology;  Laterality: N/A;  16 dot plication   . Echo, LEFT EYE  1993  . REPAIR LEFT INGUINAL HERNIA  2005    Current Outpatient Medications  Medication Sig Dispense Refill  . atorvastatin (LIPITOR) 40 MG tablet Take 1 tablet (40 mg total) by mouth daily at 6 PM. 90 tablet 2  . carvedilol (COREG) 25 MG tablet TAKE 1 TABLET BY MOUTH TWICE A DAY WITH MEALS 180 tablet 1  . Cholecalciferol (VITAMIN D3) 5000 units TABS 5,000 IU OTC vitamin D3 daily. 90 tablet 3  . clotrimazole-betamethasone (LOTRISONE) cream Apply 1 application topically 2 (two) times daily. 45 g 0  . fexofenadine (ALLEGRA) 180 MG tablet Take 1 tablet (180 mg total) by mouth daily. During allergy season 180 tablet 0  . fluticasone (FLONASE) 50 MCG/ACT nasal spray USE 1 SPRAY IN EACH NOSTRIL AFTER SINUS RINSES TWICE DAILY. 16 mL 0  . losartan (COZAAR) 25 MG tablet TAKE 1 TABLET BY MOUTH EVERY DAY 90 tablet 2  . Magnesium 250 MG TABS Take 250 mg by mouth daily.    . Multiple Vitamins-Minerals (CENTRUM ADULTS) TABS Take 1 tablet by mouth daily.    . sildenafil (REVATIO) 20 MG tablet Take 20-100 mg by mouth daily as needed.    . vitamin B-12 (CYANOCOBALAMIN) 1000 MCG tablet Take 1,000 mcg by mouth daily.    . Zinc 50 MG TABS Take by mouth.     No current facility-administered medications for this visit.    Allergies:  Patient has no known allergies.   Social History: Social History   Socioeconomic History  . Marital status: Married    Spouse name: Not on file  . Number of children: Not on file  . Years of education: Not on file  . Highest education level: Not on file  Occupational History  . Not on file  Tobacco Use  . Smoking status: Never Smoker  . Smokeless tobacco: Never Used  Vaping Use  . Vaping Use: Never used  Substance and Sexual Activity  . Alcohol use: No  . Drug use: No  . Sexual activity: Yes  Other Topics Concern  . Not  on file  Social History Narrative  . Not on file   Social Determinants of Health   Financial Resource Strain:   . Difficulty of Paying Living Expenses:   Food Insecurity:   . Worried About Charity fundraiser in the Last Year:   . Arboriculturist in the Last Year:   Transportation Needs:   . Film/video editor (Medical):   Marland Kitchen Lack of Transportation (Non-Medical):   Physical Activity:   . Days of Exercise per Week:   . Minutes of Exercise per Session:   Stress:   . Feeling of Stress :   Social Connections:   . Frequency of Communication with Friends and Family:   . Frequency of Social Gatherings with Friends and Family:   . Attends Religious Services:   . Active Member of Clubs or Organizations:   . Attends Archivist Meetings:   Marland Kitchen Marital Status:   Intimate Partner Violence:   . Fear of Current or Ex-Partner:   . Emotionally Abused:   Marland Kitchen Physically Abused:   . Sexually Abused:     Family History: Family History  Problem Relation Age of Onset  . Diabetes Mother   . Hypertension Mother   . Hyperlipidemia Mother   . Heart disease Father   . Hyperlipidemia Father   . Hypertension Father   . Arrhythmia Father        paf  . Healthy Daughter   . Healthy Son   . Heart disease Paternal Grandfather   . Heart attack Paternal Grandfather      Review of Systems: All other systems reviewed and are otherwise negative except as noted above.  Physical Exam: Vitals:   10/22/19 0959  BP: 132/78  Pulse: 68  SpO2: 97%  Weight: 164 lb (74.4 kg)  Height: 5\' 8"  (1.727 m)     GEN- The patient is well appearing, alert and oriented x 3 today.   HEENT: normocephalic, atraumatic; sclera clear, conjunctiva pink; hearing intact; oropharynx clear; neck supple  Lungs- Clear to ausculation bilaterally, normal work of breathing.  No wheezes, rales, rhonchi Heart- Regular rate and rhythm, no murmurs, rubs or gallops  GI- soft, non-tender, non-distended, bowel sounds  present  Extremities- no clubbing, cyanosis, or edema  MS- no significant deformity or atrophy Skin- warm and dry, no rash or lesion; PPM pocket well healed Psych- euthymic mood, full affect Neuro- strength and sensation are intact  PPM Interrogation- reviewed in detail today,  See PACEART report  EKG:  EKG is ordered today. The ekg ordered today shows AS-BiV pacing at 68 bpm with QRS 152 ms but in RBBB pattern.   Recent Labs: 02/18/2019: ALT 23; BUN 10; Creatinine, Ser 0.84; Hemoglobin 13.8; Platelets 229; Potassium 4.2; Sodium 139   Wt Readings from Last 3 Encounters:  10/22/19 164 lb (74.4 kg)  07/07/19 164 lb (74.4 kg)  02/23/19 161 lb 13.1 oz (73.4 kg)     Other studies Reviewed: Additional studies/ records that were reviewed today include: Previous EP office notes, Previous remote checks, Most recent labwork.   Assessment and Plan:  1. Symptomatic bradycardia s/p Medtronic BiV PPM  Normal PPM function See Pace Art report No changes today  2. HTN Continue current regimen.   3. Atrial tach No burden on device.   Current medicines are reviewed at length with the patient today.   The patient does not have concerns regarding his medicines.  The following changes were made today:  none  Labs/ tests ordered today include:  Orders Placed This Encounter  Procedures  . Basic metabolic panel  . Magnesium  . CUP PACEART [place]  . EKG 12-Lead   Disposition:   Follow up with Dr. Caryl Comes in 6 Months  Signed, Annamaria Helling  10/22/2019 10:25 AM  [facility] [address] [place] [address] [phone] (office) [phone] (fax)

## 2019-10-22 NOTE — Patient Instructions (Signed)
Medication Instructions:   Your physician recommends that you continue on your current medications as directed. Please refer to the Current Medication list given to you today.   *If you need a refill on your cardiac medications before your next appointment, please call your pharmacy*   Lab Work: [facility] AND [place]    If you have labs (blood work) drawn today and your tests are completely normal, you will receive your results only by:  [place] (if you have MyChart) OR  A paper copy in the mail If you have any lab test that is abnormal or we need to change your treatment, we will call you to review the results.   Testing/Procedures: NONE ORDERED  TODAY    Follow-Up: At [facility], you and your health needs are our priority.  As part of our continuing mission to provide you with exceptional heart care, we have created designated Provider Care Teams.  These Care Teams include your primary Cardiologist (physician) and Advanced Practice Providers (APPs -  Physician Assistants and Nurse Practitioners) who all work together to provide you with the care you need, when you need it.  We recommend signing up for the patient portal called "MyChart".  Sign up information is provided on this After Visit Summary.  MyChart is used to connect with patients for Virtual Visits (Telemedicine).  Patients are able to view lab/test results, encounter notes, upcoming appointments, etc.  Non-urgent messages can be sent to your provider as well.   To learn more about what you can do with MyChart, go to [email or web address].    Your next appointment:   6 month(s)  The format for your next appointment:   In Person  Provider:    You may see Virl Axe, MD     Other Instructions

## 2019-10-27 ENCOUNTER — Ambulatory Visit (INDEPENDENT_AMBULATORY_CARE_PROVIDER_SITE_OTHER): Payer: Medicare HMO | Admitting: *Deleted

## 2019-10-27 DIAGNOSIS — I472 Ventricular tachycardia, unspecified: Secondary | ICD-10-CM

## 2019-10-27 LAB — CUP PACEART REMOTE DEVICE CHECK
Battery Remaining Longevity: 83 mo
Battery Voltage: 2.97 V
Brady Statistic AP VP Percent: 46.92 %
Brady Statistic AP VS Percent: 0.04 %
Brady Statistic AS VP Percent: 52.77 %
Brady Statistic AS VS Percent: 0.27 %
Brady Statistic RA Percent Paced: 47.01 %
Brady Statistic RV Percent Paced: 99.68 %
Date Time Interrogation Session: 20210810225106
Implantable Lead Implant Date: 20170802
Implantable Lead Implant Date: 20170802
Implantable Lead Implant Date: 20170802
Implantable Lead Location: 753858
Implantable Lead Location: 753859
Implantable Lead Location: 753860
Implantable Lead Model: 5076
Implantable Lead Model: 5076
Implantable Pulse Generator Implant Date: 20170802
Lead Channel Impedance Value: 1045 Ohm
Lead Channel Impedance Value: 1102 Ohm
Lead Channel Impedance Value: 1121 Ohm
Lead Channel Impedance Value: 323 Ohm
Lead Channel Impedance Value: 361 Ohm
Lead Channel Impedance Value: 437 Ohm
Lead Channel Impedance Value: 437 Ohm
Lead Channel Impedance Value: 494 Ohm
Lead Channel Impedance Value: 532 Ohm
Lead Channel Impedance Value: 532 Ohm
Lead Channel Impedance Value: 722 Ohm
Lead Channel Impedance Value: 779 Ohm
Lead Channel Impedance Value: 836 Ohm
Lead Channel Impedance Value: 931 Ohm
Lead Channel Pacing Threshold Amplitude: 0.625 V
Lead Channel Pacing Threshold Amplitude: 1 V
Lead Channel Pacing Threshold Amplitude: 1.125 V
Lead Channel Pacing Threshold Pulse Width: 0.4 ms
Lead Channel Pacing Threshold Pulse Width: 0.4 ms
Lead Channel Pacing Threshold Pulse Width: 0.8 ms
Lead Channel Sensing Intrinsic Amplitude: 1.625 mV
Lead Channel Sensing Intrinsic Amplitude: 1.625 mV
Lead Channel Sensing Intrinsic Amplitude: 7.5 mV
Lead Channel Sensing Intrinsic Amplitude: 7.5 mV
Lead Channel Setting Pacing Amplitude: 1.5 V
Lead Channel Setting Pacing Amplitude: 1.75 V
Lead Channel Setting Pacing Amplitude: 2.5 V
Lead Channel Setting Pacing Pulse Width: 0.4 ms
Lead Channel Setting Pacing Pulse Width: 0.8 ms
Lead Channel Setting Sensing Sensitivity: 2.8 mV

## 2019-10-29 NOTE — Progress Notes (Signed)
Remote pacemaker transmission.   

## 2019-11-10 ENCOUNTER — Other Ambulatory Visit: Payer: Self-pay | Admitting: Physician Assistant

## 2019-11-10 DIAGNOSIS — J302 Other seasonal allergic rhinitis: Secondary | ICD-10-CM

## 2019-11-10 DIAGNOSIS — J3089 Other allergic rhinitis: Secondary | ICD-10-CM

## 2019-11-12 DIAGNOSIS — D225 Melanocytic nevi of trunk: Secondary | ICD-10-CM | POA: Diagnosis not present

## 2019-11-12 DIAGNOSIS — B078 Other viral warts: Secondary | ICD-10-CM | POA: Diagnosis not present

## 2019-11-12 DIAGNOSIS — L258 Unspecified contact dermatitis due to other agents: Secondary | ICD-10-CM | POA: Diagnosis not present

## 2019-11-13 ENCOUNTER — Other Ambulatory Visit: Payer: Self-pay | Admitting: Internal Medicine

## 2019-12-04 ENCOUNTER — Other Ambulatory Visit: Payer: Self-pay | Admitting: Physician Assistant

## 2019-12-04 DIAGNOSIS — J3089 Other allergic rhinitis: Secondary | ICD-10-CM

## 2019-12-04 DIAGNOSIS — J302 Other seasonal allergic rhinitis: Secondary | ICD-10-CM

## 2019-12-21 DIAGNOSIS — R69 Illness, unspecified: Secondary | ICD-10-CM | POA: Diagnosis not present

## 2019-12-30 ENCOUNTER — Ambulatory Visit: Payer: Medicare HMO | Admitting: Physician Assistant

## 2020-01-06 DIAGNOSIS — R69 Illness, unspecified: Secondary | ICD-10-CM | POA: Diagnosis not present

## 2020-01-21 ENCOUNTER — Encounter: Payer: Self-pay | Admitting: Physician Assistant

## 2020-01-21 ENCOUNTER — Ambulatory Visit (INDEPENDENT_AMBULATORY_CARE_PROVIDER_SITE_OTHER): Payer: Medicare HMO | Admitting: Physician Assistant

## 2020-01-21 ENCOUNTER — Other Ambulatory Visit: Payer: Self-pay

## 2020-01-21 VITALS — BP 130/80 | HR 70 | Temp 98.4°F | Ht 68.0 in | Wt 164.6 lb

## 2020-01-21 DIAGNOSIS — R4586 Emotional lability: Secondary | ICD-10-CM | POA: Diagnosis not present

## 2020-01-21 DIAGNOSIS — R413 Other amnesia: Secondary | ICD-10-CM

## 2020-01-21 DIAGNOSIS — R4189 Other symptoms and signs involving cognitive functions and awareness: Secondary | ICD-10-CM | POA: Diagnosis not present

## 2020-01-21 DIAGNOSIS — R69 Illness, unspecified: Secondary | ICD-10-CM | POA: Diagnosis not present

## 2020-01-21 MED ORDER — SERTRALINE HCL 25 MG PO TABS: 25.0000 mg | ORAL_TABLET | Freq: Every day | ORAL | 1 refills | Status: DC

## 2020-01-21 NOTE — Progress Notes (Signed)
Acute Office Visit  Subjective:    Patient ID: George [name], male    DOB: 1950/07/20, 69 y.o.   MRN: [id number]  Chief Complaint  Patient presents with  . Memory Loss    HPI Patient is in today for concerns of memory loss.  Patient is accompanied by his wife.  Reports is having trouble with short-term memory loss.  States has trouble with remembering what he did 2 weeks ago and/or forgetting people's names.  Has also noticed changes in his mood where he is more irritable and frustrated.  Patient's wife also states has noticed a change with problem solving. Both work together in [employer]. States when she is on step 4 patient is on step 1, which used to be the opposite. Patient's wife reports she started noticing these changes after he had an episode where his pulse was running in the 20s. Patient has a pacemaker. Patient denies prior history of anxiety or irritability, or memory issues. Patient is interested on starting medication to help with mood.  Past Medical History:  Diagnosis Date  . Arthritis THUMBS  . LBBB (left bundle branch block)    bilat. BBB- PPM  . Peyronie's disease     Past Surgical History:  Procedure Laterality Date  . CARDIOVASCULAR STRESS TEST  10-18-2002  DR  CRENSHAW   NORMAL ADENOSINE CARDIOLITE/ EF 60%  . CATARACT EXTRACTION W/ INTRAOCULAR LENS IMPLANT  2006   LEFT EYE  . EP IMPLANTABLE DEVICE N/A 10/18/2015   Procedure: BiV Pacemaker Insertion CRT-P;  Surgeon: [name], MD;  Location: [facility];  Service: Cardiovascular;  Laterality: N/A;  . INGUINAL HERNIA REPAIR Right 02/23/2019   Procedure: RIGHT INGUINAL HERNIA REPAIR WITH MESH;  Surgeon: Erroll Luna, MD;  Location: [facility];  Service: General;  Laterality: Right;  . LAPAROSCOPIC CHOLECYSTECTOMY  10-09-2007  . LEFT HEART CATH AND CORONARY ANGIOGRAPHY N/A 01/30/2018   Procedure: LEFT HEART CATH AND CORONARY ANGIOGRAPHY;  Surgeon: Belva Crome, MD;  Location: [facility];  Service: Cardiovascular;  Laterality: N/A;  . NESBIT PROCEDURE  01/17/2012   Procedure: NESBIT PROCEDURE;  Surgeon: [name], MD;  Location: [facility];  Service: Urology;  Laterality: N/A;  16 dot plication   . [place], LEFT EYE  1993  . REPAIR LEFT INGUINAL HERNIA  2005    Family History  Problem Relation Age of Onset  . Diabetes Mother   . Hypertension Mother   . Hyperlipidemia Mother   . Heart disease Father   . Hyperlipidemia Father   . Hypertension Father   . Arrhythmia Father        paf  . Healthy Daughter   . Healthy Son   . Heart disease Paternal Grandfather   . Heart attack Paternal Grandfather     Social History   Socioeconomic History  . Marital status: Married    Spouse name: Not on file  . Number of children: Not on file  . Years of education: Not on file  . Highest education level: Not on file  Occupational History  . Not on file  Tobacco Use  . Smoking status: Never Smoker  . Smokeless tobacco: Never Used  Vaping Use  . Vaping Use: Never used  Substance and Sexual Activity  . Alcohol use: No  . Drug use: No  . Sexual activity: Yes  Other Topics Concern  . Not on file  Social History Narrative  .  Not on file   Social Determinants of Health   Financial Resource Strain:   . Difficulty of Paying Living Expenses: Not on file  Food Insecurity:   . Worried About Charity fundraiser in the Last Year: Not on file  . Ran Out of Food in the Last Year: Not on file  Transportation Needs:   . Lack of Transportation (Medical): Not on file  . Lack of Transportation (Non-Medical): Not on file  Physical Activity:   . Days of Exercise per Week: Not on file  . Minutes of Exercise per Session: Not on file  Stress:   . Feeling of Stress : Not on file  Social Connections:   . Frequency of Communication with Friends and Family: Not on file  . Frequency of Social Gatherings with Friends and Family: Not on  file  . Attends Religious Services: Not on file  . Active Member of Clubs or Organizations: Not on file  . Attends Archivist Meetings: Not on file  . Marital Status: Not on file  Intimate Partner Violence:   . Fear of Current or Ex-Partner: Not on file  . Emotionally Abused: Not on file  . Physically Abused: Not on file  . Sexually Abused: Not on file    Outpatient Medications Prior to Visit  Medication Sig Dispense Refill  . atorvastatin (LIPITOR) 40 MG tablet Take 1 tablet (40 mg total) by mouth daily at 6 PM. 90 tablet 2  . carvedilol (COREG) 25 MG tablet TAKE 1 TABLET BY MOUTH TWICE A DAY WITH MEALS 180 tablet 1  . Cholecalciferol (VITAMIN D3) 5000 units TABS 5,000 IU OTC vitamin D3 daily. 90 tablet 3  . clotrimazole-betamethasone (LOTRISONE) cream Apply 1 application topically 2 (two) times daily. 45 g 0  . fexofenadine (ALLEGRA) 180 MG tablet Take 1 tablet (180 mg total) by mouth daily. During allergy season 180 tablet 0  . fluticasone (FLONASE) 50 MCG/ACT nasal spray USE 1 SPRAY IN EACH NOSTRIL AFTER SINUS RINSES TWICE DAILY. 16 mL 2  . losartan (COZAAR) 25 MG tablet TAKE 1 TABLET BY MOUTH EVERY DAY 90 tablet 2  . Magnesium 250 MG TABS Take 250 mg by mouth daily.    . Multiple Vitamins-Minerals (CENTRUM ADULTS) TABS Take 1 tablet by mouth daily.    . sildenafil (REVATIO) 20 MG tablet Take 20-100 mg by mouth daily as needed.    . vitamin B-12 (CYANOCOBALAMIN) 1000 MCG tablet Take 1,000 mcg by mouth daily.    . Zinc 50 MG TABS Take by mouth.     No facility-administered medications prior to visit.    No Known Allergies  Review of Systems A fourteen system review of systems was performed and found to be positive as per HPI.    Objective:    Physical Exam General:  Well Developed, well nourished, appropriate for stated age.  Neuro:  Alert and oriented,  extra-ocular muscles intact, no focal deficits  HEENT:  Normocephalic, atraumatic, neck supple Skin:  no gross  rash, warm, pink. Cardiac:  RRR, no murmur Respiratory:  ECTA B/L, Not using accessory muscles, speaking in full sentences- unlabored. Vascular:  Ext warm, no cyanosis apprec.; cap RF less 2 sec. Psych:  No HI/SI, judgement and insight good, anxious mood. Full Affect.  BP 130/80   Pulse 70   Temp 98.4 F (36.9 C) (Oral)   Ht 5\' 8"  (1.727 m)   Wt 164 lb 9.6 oz (74.7 kg)   SpO2 98% Comment: on RA  BMI 25.03 kg/m  Wt Readings from Last 3 Encounters:  01/21/20 164 lb 9.6 oz (74.7 kg)  10/22/19 164 lb (74.4 kg)  07/07/19 164 lb (74.4 kg)    Health Maintenance Due  Topic Date Due  . PNA vac Low Risk Adult (1 of 2 - PCV13) Never done  . INFLUENZA VACCINE  Never done  . COVID-19 Vaccine (2 - Pfizer 2-dose series) 01/18/2020    There are no preventive care reminders to display for this patient.   Lab Results  Component Value Date   TSH 1.110 09/24/2018   Lab Results  Component Value Date   WBC 8.4 02/18/2019   HGB 13.8 02/18/2019   HCT 39.5 02/18/2019   MCV 89.2 02/18/2019   PLT 229 02/18/2019   Lab Results  Component Value Date   NA 139 10/22/2019   K 3.9 10/22/2019   CO2 26 10/22/2019   GLUCOSE 97 10/22/2019   BUN 9 10/22/2019   CREATININE 0.90 10/22/2019   BILITOT 2.3 (H) 02/18/2019   ALKPHOS 90 02/18/2019   AST 24 02/18/2019   ALT 23 02/18/2019   PROT 7.3 02/18/2019   ALBUMIN 3.9 02/18/2019   CALCIUM 9.8 10/22/2019   ANIONGAP 8 02/18/2019   Lab Results  Component Value Date   CHOL 126 09/24/2018   Lab Results  Component Value Date   HDL 60 09/24/2018   Lab Results  Component Value Date   LDLCALC 52 09/24/2018   Lab Results  Component Value Date   TRIG 68 09/24/2018   Lab Results  Component Value Date   CHOLHDL 2.1 09/24/2018   Lab Results  Component Value Date   HGBA1C 5.5 09/24/2018       Assessment & Plan:   Problem List Items Addressed This Visit    None    Visit Diagnoses    Alteration in cognition    -  Primary   Relevant  Orders   Ambulatory referral to Neurology   Memory changes       Relevant Orders   Ambulatory referral to Neurology   Mood changes       Relevant Medications   sertraline (ZOLOFT) 25 MG tablet     Memory changes, Alteration in cognition: -Patients Mini-mental status exam score is 29/30, wnl. Discussed with patient and wife referral to Neurology for consultation due to cognition and memory concerns for further evaluation. Both are agreeable. Potentially cardiac condition could have resulted in hypoxic brain injury and patient will likely benefit from imaging studies.   Mood changes: -GAD score of 3, PHQ-9 score of 2   -Discussed with patient management options including medication and side effects. Patient is agreeable to starting sertraline 25 mg. -Recommend to incorporate nonpharmacological therapy such as relaxation techniques and mindfulness therapy. -Follow-up in 8 weeks to reassess symptoms and medication therapy.   Meds ordered this encounter  Medications  . sertraline (ZOLOFT) 25 MG tablet    Sig: Take 1 tablet (25 mg total) by mouth daily.    Dispense:  30 tablet    Refill:  1    Order Specific Question:   Supervising Provider    Answer:   Beatrice Lecher D [[id number]]   Note:  This note was prepared with assistance of Dragon voice recognition software. Occasional wrong-word or sound-a-like substitutions may have occurred due to the inherent limitations of voice recognition software.   Lorrene Reid, PA-C

## 2020-01-24 ENCOUNTER — Encounter: Payer: Self-pay | Admitting: Neurology

## 2020-01-25 ENCOUNTER — Telehealth: Payer: Self-pay | Admitting: Physician Assistant

## 2020-01-25 MED ORDER — FLUOXETINE HCL 10 MG PO CAPS: 10.0000 mg | ORAL_CAPSULE | Freq: Every day | ORAL | 0 refills | Status: DC

## 2020-01-25 NOTE — Telephone Encounter (Signed)
Patient wife called in asking to speak about the medicine they prescribed George [name] (2050-10-26) for anxiety. [phone] Thanks   Patient wife states patient is not comfortable taking the Zoloft 25mg  and she wants [place] to send in the same medication for him that she is taking (Prozac 10mg )  Spoke with Herb Grays who is agreeable to Prozac 10mg  1 PO QD. Sending this medication to pharmacy.   Jackelyn Poling is aware medication has been sent to pharmacy. AS, CMA

## 2020-01-26 ENCOUNTER — Ambulatory Visit (INDEPENDENT_AMBULATORY_CARE_PROVIDER_SITE_OTHER): Payer: Medicare HMO

## 2020-01-26 DIAGNOSIS — I442 Atrioventricular block, complete: Secondary | ICD-10-CM | POA: Diagnosis not present

## 2020-01-26 LAB — CUP PACEART REMOTE DEVICE CHECK
Battery Remaining Longevity: 77 mo
Battery Voltage: 2.97 V
Brady Statistic AP VP Percent: 37.93 %
Brady Statistic AP VS Percent: 0.04 %
Brady Statistic AS VP Percent: 61.64 %
Brady Statistic AS VS Percent: 0.4 %
Brady Statistic RA Percent Paced: 38.07 %
Brady Statistic RV Percent Paced: 99.56 %
Date Time Interrogation Session: 20211109210353
Implantable Lead Implant Date: 20170802
Implantable Lead Implant Date: 20170802
Implantable Lead Implant Date: 20170802
Implantable Lead Location: 753858
Implantable Lead Location: 753859
Implantable Lead Location: 753860
Implantable Lead Model: 5076
Implantable Lead Model: 5076
Implantable Pulse Generator Implant Date: 20170802
Lead Channel Impedance Value: 1007 Ohm
Lead Channel Impedance Value: 1064 Ohm
Lead Channel Impedance Value: 1121 Ohm
Lead Channel Impedance Value: 323 Ohm
Lead Channel Impedance Value: 380 Ohm
Lead Channel Impedance Value: 437 Ohm
Lead Channel Impedance Value: 437 Ohm
Lead Channel Impedance Value: 513 Ohm
Lead Channel Impedance Value: 513 Ohm
Lead Channel Impedance Value: 513 Ohm
Lead Channel Impedance Value: 703 Ohm
Lead Channel Impedance Value: 760 Ohm
Lead Channel Impedance Value: 817 Ohm
Lead Channel Impedance Value: 931 Ohm
Lead Channel Pacing Threshold Amplitude: 0.75 V
Lead Channel Pacing Threshold Amplitude: 1 V
Lead Channel Pacing Threshold Amplitude: 1.25 V
Lead Channel Pacing Threshold Pulse Width: 0.4 ms
Lead Channel Pacing Threshold Pulse Width: 0.4 ms
Lead Channel Pacing Threshold Pulse Width: 0.8 ms
Lead Channel Sensing Intrinsic Amplitude: 1.875 mV
Lead Channel Sensing Intrinsic Amplitude: 1.875 mV
Lead Channel Sensing Intrinsic Amplitude: 7.5 mV
Lead Channel Sensing Intrinsic Amplitude: 7.5 mV
Lead Channel Setting Pacing Amplitude: 1.5 V
Lead Channel Setting Pacing Amplitude: 1.75 V
Lead Channel Setting Pacing Amplitude: 2.5 V
Lead Channel Setting Pacing Pulse Width: 0.4 ms
Lead Channel Setting Pacing Pulse Width: 0.8 ms
Lead Channel Setting Sensing Sensitivity: 2.8 mV

## 2020-01-27 NOTE — Progress Notes (Signed)
Remote pacemaker transmission.   

## 2020-02-08 ENCOUNTER — Ambulatory Visit: Payer: Medicare HMO | Admitting: Neurology

## 2020-02-08 ENCOUNTER — Other Ambulatory Visit (INDEPENDENT_AMBULATORY_CARE_PROVIDER_SITE_OTHER): Payer: Medicare HMO

## 2020-02-08 ENCOUNTER — Other Ambulatory Visit: Payer: Self-pay

## 2020-02-08 ENCOUNTER — Encounter: Payer: Self-pay | Admitting: Neurology

## 2020-02-08 VITALS — BP 143/80 | HR 66 | Ht 68.0 in | Wt 163.8 lb

## 2020-02-08 DIAGNOSIS — N4342 Spermatocele of epididymis, multiple: Secondary | ICD-10-CM | POA: Diagnosis not present

## 2020-02-08 DIAGNOSIS — R3912 Poor urinary stream: Secondary | ICD-10-CM | POA: Diagnosis not present

## 2020-02-08 DIAGNOSIS — Z125 Encounter for screening for malignant neoplasm of prostate: Secondary | ICD-10-CM | POA: Diagnosis not present

## 2020-02-08 DIAGNOSIS — R351 Nocturia: Secondary | ICD-10-CM | POA: Diagnosis not present

## 2020-02-08 DIAGNOSIS — R413 Other amnesia: Secondary | ICD-10-CM

## 2020-02-08 DIAGNOSIS — N401 Enlarged prostate with lower urinary tract symptoms: Secondary | ICD-10-CM | POA: Diagnosis not present

## 2020-02-08 LAB — TSH: TSH: 0.96 u[IU]/mL (ref 0.35–4.50)

## 2020-02-08 NOTE — Progress Notes (Signed)
NEUROLOGY CONSULTATION NOTE  George [name] MRN: [id number] DOB: 06/26/1950  Referring provider: Lorrene Reid, PA-C Primary care provider: Lorrene Reid, PA-C  Reason for consult:  Memory loss   Thank you for your kind referral of George [name] for consultation of the above symptoms. Although his history is well known to you, please allow me to reiterate it for the purpose of our medical record. The patient was accompanied to the clinic by his wife who also provides collateral information. Records and images were personally reviewed where available.   HISTORY OF PRESENT ILLNESS: This is a pleasant 69 year old right-handed man with a history of hypertension, hyperlipidemia, complete heart block s/p PPM, B12 deficiency, presenting for evaluation of memory loss. He and his wife started noticing changes the last couple of years. His wife reports after he had his pacemaker placed 4 years ago, she has noticed progression of memory changes each year. He has difficulties remembering names, even people he knew for a long time. It eventually comes back to him. He has noticed word-finding difficulties, he would be in the middle of a sentence then come back to it and start all over again. He misplaces things frequently. 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 He denies any issues, however his wife has noticed that despite their work being repetitive, he has a slower pace than before. She would be at step 4 and he is still at step 1. He has to figure things out a little longer. He denies getting lost driving. His wife denies any driving concerns. He rarely misses medications and states he tries to be religious with them. His wife manages finances. He is independent with dressing and bathing. Over the past year, he has had personality changes where in the past he used to be easygoing, but lately he snaps at everybody. No paranoia or hallucinations. He was started on Prozac 2  weeks ago for anxiety, his wife does not think he has trouble with depression. They have not noticed any improvement yet. There is a strong family history of dementia in his mother, maternal grandparents, maternal uncle. His 37 year old father is having memory issues as well. No history of significant head injuries or alcohol use.  He denies any headaches, dizziness, diplopia, dysarthria, dysphagia, neck/back pain, focal numbness/tingling/weakness, bowel/bladder dysfunction. No anosmia, tremors. Sleep is good, no REM behavior disorder. Some nights he is restless in bed and does not remember it. He endorses refreshing sleep, sometimes he feels sleepy in the afternoon. He fell twice in one day a month ago, no significant injuries.   Laboratory Data: Lab Results  Component Value Date   TSH 1.110 09/24/2018   Lab Results  Component Value Date   [id number] 093 09/24/2018     PAST MEDICAL HISTORY: Past Medical History:  Diagnosis Date  . Arthritis THUMBS  . LBBB (left bundle branch block)    bilat. BBB- PPM  . Peyronie's disease     PAST SURGICAL HISTORY: Past Surgical History:  Procedure Laterality Date  . CARDIOVASCULAR STRESS TEST  10-18-2002  DR  CRENSHAW   NORMAL ADENOSINE CARDIOLITE/ EF 60%  . CATARACT EXTRACTION W/ INTRAOCULAR LENS IMPLANT  2006   LEFT EYE  . EP IMPLANTABLE DEVICE N/A 10/18/2015   Procedure: BiV Pacemaker Insertion CRT-P;  Surgeon: [name], MD;  Location: [facility];  Service: Cardiovascular;  Laterality: N/A;  . INGUINAL HERNIA REPAIR Right 02/23/2019   Procedure: RIGHT INGUINAL HERNIA  REPAIR WITH MESH;  Surgeon: Erroll Luna, MD;  Location: [facility];  Service: General;  Laterality: Right;  . LAPAROSCOPIC CHOLECYSTECTOMY  10-09-2007  . LEFT HEART CATH AND CORONARY ANGIOGRAPHY N/A 01/30/2018   Procedure: LEFT HEART CATH AND CORONARY ANGIOGRAPHY;  Surgeon: Belva Crome, MD;  Location: [facility];  Service: Cardiovascular;   Laterality: N/A;  . NESBIT PROCEDURE  01/17/2012   Procedure: NESBIT PROCEDURE;  Surgeon: [name], MD;  Location: [facility];  Service: Urology;  Laterality: N/A;  16 dot plication   . [place], LEFT EYE  1993  . REPAIR LEFT INGUINAL HERNIA  2005    MEDICATIONS: Current Outpatient Medications on File Prior to Visit  Medication Sig Dispense Refill  . atorvastatin (LIPITOR) 40 MG tablet Take 1 tablet (40 mg total) by mouth daily at 6 PM. 90 tablet 2  . carvedilol (COREG) 25 MG tablet TAKE 1 TABLET BY MOUTH TWICE A DAY WITH MEALS 180 tablet 1  . Cholecalciferol (VITAMIN D3) 5000 units TABS 5,000 IU OTC vitamin D3 daily. 90 tablet 3  . clotrimazole-betamethasone (LOTRISONE) cream Apply 1 application topically 2 (two) times daily. 45 g 0  . fexofenadine (ALLEGRA) 180 MG tablet Take 1 tablet (180 mg total) by mouth daily. During allergy season 180 tablet 0  . FLUoxetine (PROZAC) 10 MG capsule Take 1 capsule (10 mg total) by mouth daily. 90 capsule 0  . fluticasone (FLONASE) 50 MCG/ACT nasal spray USE 1 SPRAY IN EACH NOSTRIL AFTER SINUS RINSES TWICE DAILY. 16 mL 2  . losartan (COZAAR) 25 MG tablet TAKE 1 TABLET BY MOUTH EVERY DAY 90 tablet 2  . Magnesium 250 MG TABS Take 250 mg by mouth daily.    . Multiple Vitamins-Minerals (CENTRUM ADULTS) TABS Take 1 tablet by mouth daily.    . sildenafil (REVATIO) 20 MG tablet Take 20-100 mg by mouth daily as needed.    . vitamin B-12 (CYANOCOBALAMIN) 1000 MCG tablet Take 1,000 mcg by mouth daily.    . Zinc 50 MG TABS Take by mouth.     No current facility-administered medications on file prior to visit.    ALLERGIES: No Known Allergies  FAMILY HISTORY: Family History  Problem Relation Age of Onset  . Diabetes Mother   . Hypertension Mother   . Hyperlipidemia Mother   . Heart disease Father   . Hyperlipidemia Father   . Hypertension Father   . Arrhythmia Father        paf  . Healthy Daughter   . Healthy Son    . Heart disease Paternal Grandfather   . Heart attack Paternal Grandfather     SOCIAL HISTORY: Social History   Socioeconomic History  . Marital status: Married    Spouse name: Not on file  . Number of children: Not on file  . Years of education: Not on file  . Highest education level: Not on file  Occupational History  . Not on file  Tobacco Use  . Smoking status: Never Smoker  . Smokeless tobacco: Never Used  Vaping Use  . Vaping Use: Never used  Substance and Sexual Activity  . Alcohol use: No  . Drug use: No  . Sexual activity: Yes  Other Topics Concern  . Not on file  Social History Narrative   Right handed    Lives with wife   Social Determinants of Health   Financial Resource Strain:   . Difficulty of Paying Living Expenses: Not  on file  Food Insecurity:   . Worried About Charity fundraiser in the Last Year: Not on file  . Ran Out of Food in the Last Year: Not on file  Transportation Needs:   . Lack of Transportation (Medical): Not on file  . Lack of Transportation (Non-Medical): Not on file  Physical Activity:   . Days of Exercise per Week: Not on file  . Minutes of Exercise per Session: Not on file  Stress:   . Feeling of Stress : Not on file  Social Connections:   . Frequency of Communication with Friends and Family: Not on file  . Frequency of Social Gatherings with Friends and Family: Not on file  . Attends Religious Services: Not on file  . Active Member of Clubs or Organizations: Not on file  . Attends Archivist Meetings: Not on file  . Marital Status: Not on file  Intimate Partner Violence:   . Fear of Current or Ex-Partner: Not on file  . Emotionally Abused: Not on file  . Physically Abused: Not on file  . Sexually Abused: Not on file     PHYSICAL EXAM: Vitals:   02/08/20 1005  BP: (!) 143/80  Pulse: 66  SpO2: 99%   General: No acute distress Head:  Normocephalic/atraumatic Skin/Extremities: No rash, no  edema Neurological Exam: Mental status: alert and oriented to person, place, and time, no dysarthria or aphasia, Fund of knowledge is appropriate.  Recent and remote memory are intact.  Attention and concentration are normal.    Able to name objects and repeat phrases. [place] score 25/30 Montreal Cognitive Assessment  02/08/2020  Visuospatial/ Executive (0/5) 4  Naming (0/3) 3  Attention: Read list of digits (0/2) 2  Attention: Read list of letters (0/1) 1  Attention: Serial 7 subtraction starting at 100 (0/3) 3  Language: Repeat phrase (0/2) 1  Language : Fluency (0/1) 1  Abstraction (0/2) 0  Delayed Recall (0/5) 3  Orientation (0/6) 6  Total 24  Adjusted Score (based on education) 25    Cranial nerves: CN I: not tested CN II: pupils equal, round and reactive to light, visual fields intact CN III, IV, VI:  full range of motion, no nystagmus, no ptosis CN V: facial sensation intact CN VII: upper and lower face symmetric CN VIII: hearing intact to conversation CN IX, X: gag intact, uvula midline CN XI: sternocleidomastoid and trapezius muscles intact CN XII: tongue midline Bulk & Tone: normal, no fasciculations. Motor: 5/5 throughout with no pronator drift. Sensation: intact to light touch, cold, pin, vibration sense. Romberg test negative Deep Tendon Reflexes: +1 throughout Cerebellar: no incoordination on finger to nose testing Gait: narrow-based and steady, able to tandem walk adequately. Tremor: none   IMPRESSION: This is a pleasant 69 year old right-handed man with a history of hypertension, hyperlipidemia, complete heart block s/p PPM, B12 deficiency, presenting for evaluation of memory loss. His neurological exam is non-focal, MOCA score today 25/30. He and his wife deny any difficulties with complex tasks. We discussed different causes of memory loss, check TSH and B12. He is unable to obtain an MRI due to pacemaker, head CT without contrast will be ordered to assess for  underlying structural abnormality. He will be scheduled for Neurocognitive testing to further evaluate cognitive concerns. No clear indication to start dementia medications at this time. We discussed how mood can also affect memory, continue with Prozac as prescribed. We discussed the importance of control of vascular risk factors,  physical exercise, and brain stimulation exercises for brain health. Follow-up in 6 months, they know to call for any changes.   Thank you for allowing me to participate in the care of this patient. Please do not hesitate to call for any questions or concerns.   Ellouise Newer, M.D.  CC: Lorrene Reid, PA-C

## 2020-02-08 NOTE — Patient Instructions (Signed)
Good to meet you!  1. Bloodwork for TSH, B12  2. Schedule head CT without contrast  3. Schedule Neurocognitive testing  4. Continue Prozac as prescribed  5. Follow-up in 6 months, call for any changes   RECOMMENDATIONS FOR ALL PATIENTS WITH MEMORY PROBLEMS: 1. Continue to exercise (Recommend 30 minutes of walking everyday, or 3 hours every week) 2. Increase social interactions - continue going to [place] and enjoy social gatherings with friends and family 3. Eat healthy, avoid fried foods and eat more fruits and vegetables 4. Maintain adequate blood pressure, blood sugar, and blood cholesterol level. Reducing the risk of stroke and cardiovascular disease also helps promoting better memory. 5. Avoid stressful situations. Live a simple life and avoid aggravations. Organize your time and prepare for the next day in anticipation. 6. Sleep well, avoid any interruptions of sleep and avoid any distractions in the bedroom that may interfere with adequate sleep quality 7. Avoid sugar, avoid sweets as there is a strong link between excessive sugar intake, diabetes, and cognitive impairment We discussed the Mediterranean diet, which has been shown to help patients reduce the risk of progressive memory disorders and reduces cardiovascular risk. This includes eating fish, eat fruits and green leafy vegetables, nuts like almonds and hazelnuts, walnuts, and also use olive oil. Avoid fast foods and fried foods as much as possible. Avoid sweets and sugar as sugar use has been linked to worsening of memory function.

## 2020-02-09 ENCOUNTER — Telehealth: Payer: Self-pay

## 2020-02-09 LAB — VITAMIN B12: Vitamin B-12: 466 pg/mL (ref 211–911)

## 2020-02-09 NOTE — Telephone Encounter (Signed)
-----   Message from Cameron Sprang, MD sent at 02/09/2020 12:25 PM EST ----- Pls let him know thyroid and B12 levels are normal. Thanks

## 2020-02-09 NOTE — Telephone Encounter (Signed)
Pt called and informed that thyroid and B12 levels are normal

## 2020-03-03 ENCOUNTER — Ambulatory Visit
Admission: RE | Admit: 2020-03-03 | Discharge: 2020-03-03 | Disposition: A | Discharge status: Home / Self Care | Payer: Medicare HMO | Source: Ambulatory Visit | Attending: Neurology | Admitting: Neurology

## 2020-03-03 DIAGNOSIS — R413 Other amnesia: Secondary | ICD-10-CM | POA: Diagnosis not present

## 2020-03-03 DIAGNOSIS — I639 Cerebral infarction, unspecified: Secondary | ICD-10-CM | POA: Diagnosis not present

## 2020-03-04 ENCOUNTER — Other Ambulatory Visit: Payer: Self-pay | Admitting: Physician Assistant

## 2020-03-04 DIAGNOSIS — J3089 Other allergic rhinitis: Secondary | ICD-10-CM

## 2020-03-04 DIAGNOSIS — J302 Other seasonal allergic rhinitis: Secondary | ICD-10-CM

## 2020-03-06 ENCOUNTER — Telehealth: Payer: Self-pay

## 2020-03-06 NOTE — Telephone Encounter (Signed)
-----   Message from Cameron Sprang, MD sent at 03/06/2020  9:48 AM EST ----- Pls let him know head CT did not show any evidence of tumor, bleed, or new stroke. It showed age-related changes. Proceed with Neurocognitive testing as scheduled. thanks

## 2020-03-06 NOTE — Telephone Encounter (Signed)
Spoke with pt informed that head CT did not show any evidence of tumor, bleed, or new stroke. It showed age-related changes. Proceed with Neurocognitive testing as scheduled

## 2020-03-08 ENCOUNTER — Other Ambulatory Visit: Payer: Self-pay

## 2020-03-08 ENCOUNTER — Ambulatory Visit (INDEPENDENT_AMBULATORY_CARE_PROVIDER_SITE_OTHER): Payer: Medicare HMO | Admitting: Physician Assistant

## 2020-03-08 ENCOUNTER — Encounter: Payer: Self-pay | Admitting: Physician Assistant

## 2020-03-08 VITALS — BP 116/57 | HR 72 | Temp 97.4°F | Ht 68.5 in | Wt 165.0 lb

## 2020-03-08 DIAGNOSIS — I428 Other cardiomyopathies: Secondary | ICD-10-CM

## 2020-03-08 DIAGNOSIS — R413 Other amnesia: Secondary | ICD-10-CM

## 2020-03-08 DIAGNOSIS — R4586 Emotional lability: Secondary | ICD-10-CM | POA: Diagnosis not present

## 2020-03-08 DIAGNOSIS — R69 Illness, unspecified: Secondary | ICD-10-CM | POA: Diagnosis not present

## 2020-03-08 NOTE — Progress Notes (Signed)
Telehealth office visit note for George Reid, PA-C- at Primary Care at [facility]   I connected with current patient today by telephone and verified that I am speaking with the correct person   . Location of the patient: Home . Location of the provider: Office - This visit type was conducted due to national recommendations for restrictions regarding the COVID-19 Pandemic (e.g. social distancing) in an effort to limit this patient's exposure and mitigate transmission in our community.    - No physical exam could be performed with this format, beyond that communicated to [place] by the patient/ family members as noted.   - Additionally my office staff/ schedulers were to discuss with the patient that there may be a monetary charge related to this service, depending on their medical insurance.  My understanding is that patient understood and consented to proceed.     _________________________________________________________________________________   History of Present Illness: Patient calls in to follow-up on memory concerns and mood management.  Patient decided he wanted to try Prozac instead of Zoloft due to the long list of potential side effects including depression. Is taking Prozac 10 mg without issues. Feels like has noticed an improvement with irritability and anxiety.  Memory: Has seen neurology and recently had MRI imaging which revealed changes related to age.  Is planning to have further testing.   GAD 7 : Generalized Anxiety Score 01/21/2020  Nervous, Anxious, on Edge 1  Control/stop worrying 0  Worry too much - different things 1  Trouble relaxing 0  Restless 0  Easily annoyed or irritable 1  Afraid - awful might happen 0  Total GAD 7 Score 3  Anxiety Difficulty Somewhat difficult    Depression screen [facility] 2/9 03/08/2020 01/21/2020 01/14/2019 06/23/2018 05/26/2018  Decreased Interest 0 0 0 0 0  Down, Depressed, Hopeless 0 0 0 0 0  PHQ - 2 Score 0 0 0 0 0  Altered  sleeping 0 0 0 0 0  Tired, decreased energy 0 0 1 0 0  Change in appetite 0 0 0 0 0  Feeling bad or failure about yourself  0 0 0 0 0  Trouble concentrating 0 2 0 0 0  Moving slowly or fidgety/restless 0 0 0 0 0  Suicidal thoughts 0 0 0 0 0  PHQ-9 Score 0 2 1 0 0  Difficult doing work/chores - Not difficult at all Not difficult at all Not difficult at all -  Some recent data might be hidden      Impression and Recommendations:     1. Mood changes   2. Memory changes     Mood changes: -PHQ-9 score of 0 -Continue Prozac 10 mg. -Will continue to monitor, follow-up in 3 months.  Memory changes: -Continue to follow-up with neurology. -CT head without contrast 03/03/2020: IMPRESSION: No acute abnormality. Age-appropriate atrophy. Small chronic infarct left frontal white matter.  NICM: -Followed by Cardiology.   - As part of my medical decision making, I reviewed the following data within the [place] History obtained from pt /family, CMA notes reviewed and incorporated if applicable, Labs reviewed, Radiograph/ tests reviewed if applicable and OV notes from prior OV's with me, as well as any other specialists she/he has seen since seeing me last, were all reviewed and used in my medical decision making process today.    - Additionally, when appropriate, discussion had with patient regarding our treatment plan, and their biases/concerns about that plan were  used in my medical decision making today.    - The patient agreed with the plan and demonstrated an understanding of the instructions.   No barriers to understanding were identified.     - The patient was advised to call back or seek an in-person evaluation if the symptoms worsen or if the condition fails to improve as anticipated.   Return in about 3 months (around 06/06/2020) for Mood.    No orders of the defined types were placed in this encounter.   No orders of the defined types were placed in this  encounter.   There are no discontinued medications.     Time spent on visit including pre-visit chart review and post-visit care was  8 minutes.      The [place] was signed into law in 2016 which includes the topic of electronic health records.  This provides immediate access to information in MyChart.  This includes consultation notes, operative notes, office notes, lab results and pathology reports.  If you have any questions about what you read please let us know at your next visit or call us at the office.  We are right here with you.  Note:  This note was prepared with assistance of Dragon voice recognition software. Occasional wrong-word or sound-a-like substitutions may have occurred due to the inherent limitations of voice recognition software.   __________________________________________________________________________________     Patient Care Team    Relationship Specialty Notifications Start End  George [name], [place] PCP - General   07/18/19   [name], MD PCP - Cardiology Cardiology Admissions 12/26/17   [name], MD PCP - Electrophysiology Cardiology  10/22/19   Wonda Horner, MD Consulting Physician Gastroenterology  11/12/16   Allyn Kenner, MD Consulting Physician Dermatology  11/13/16   Pa, [place] Physician Optometry  11/13/16   [name], MD Consulting Physician Cardiology  05/13/17    Comment: PM placement and EP  Cameron Sprang, MD Consulting Physician Neurology  02/08/20      -Vitals obtained; medications/ allergies reconciled;  personal medical, social, Sx etc.histories were updated by CMA, reviewed by me and are reflected in chart   Patient Active Problem List   Diagnosis Date Noted  . S/P arthroscopy of right shoulder 02/09/2019  . Rotator cuff tear arthropathy of right shoulder 01/14/2019  . Impingement syndrome of right shoulder 01/14/2019  . VT (ventricular tachycardia) ([place]) 11/12/2018  .  Environmental and seasonal allergies 06/09/2018  . Senile solar keratosis 05/26/2018  . Contact dermatitis 05/26/2018  . Eczema of eyelid 05/26/2018  . Inguinal hernia of right side without obstruction or gangrene 05/26/2018  . CAD (coronary artery disease), native coronary artery 01/30/2018  . Coronary artery aneurysm 01/30/2018  . Other male erectile dysfunction- "hx of fractured penis requiring sx" 05/20/2017  . Hyperlipidemia LDL goal <100 05/13/2017  . Dizziness 08/21/2016  . BBBB (bilateral bundle branch block) 10/18/2015  . Complete heart block ([place]) 10/18/2015  . Vitamin B12 deficiency 10/06/2015  . Vitamin D insufficiency 10/06/2015  . Bradycardia 10/06/2015  . Hereditary hyperbilirubinemia 10/06/2015  . H/O noncompliance with medical treatment, presenting hazards to health 09/19/2015  . Peyronie's disease 09/19/2015  . DOE (dyspnea on exertion) 09/19/2015  . Unexplained weight loss 09/14/2015  . Essential hypertension 09/14/2015  . h/o Non-compliance: Has not seen a doc in over 13 years 09/14/2015  . Fatigue 09/14/2015  . History of nonadherence to medical treatment 09/14/2015  Current Meds  Medication Sig  . atorvastatin (LIPITOR) 40 MG tablet Take 1 tablet (40 mg total) by mouth daily at 6 PM.  . carvedilol (COREG) 25 MG tablet TAKE 1 TABLET BY MOUTH TWICE A DAY WITH MEALS  . Cholecalciferol (VITAMIN D3) 5000 units TABS 5,000 IU OTC vitamin D3 daily.  . clotrimazole-betamethasone (LOTRISONE) cream Apply 1 application topically 2 (two) times daily.  . fexofenadine (ALLEGRA) 180 MG tablet Take 1 tablet (180 mg total) by mouth daily. During allergy season  . FLUoxetine (PROZAC) 10 MG capsule Take 1 capsule (10 mg total) by mouth daily.  . fluticasone (FLONASE) 50 MCG/ACT nasal spray USE 1 SPRAY IN EACH NOSTRIL AFTER SINUS RINSES TWICE DAILY.  Marland Kitchen losartan (COZAAR) 25 MG tablet TAKE 1 TABLET BY MOUTH EVERY DAY  . Magnesium 250 MG TABS Take 250 mg by mouth daily.  .  Multiple Vitamins-Minerals (CENTRUM ADULTS) TABS Take 1 tablet by mouth daily.  . sildenafil (REVATIO) 20 MG tablet Take 20-100 mg by mouth daily as needed.  . tamsulosin (FLOMAX) 0.4 MG CAPS capsule Take 0.4 mg by mouth daily.  . vitamin B-12 (CYANOCOBALAMIN) 1000 MCG tablet Take 1,000 mcg by mouth daily.     Allergies:  No Known Allergies   ROS:  See above HPI for pertinent positives and negatives   Objective:   Blood pressure (!) 116/57, pulse 72, temperature (!) 97.4 F (36.3 C), temperature source Oral, height 5' 8.5" (1.74 m), weight 165 lb (74.8 kg).  (if some vitals are omitted, this means that patient was UNABLE to obtain them. ) General: A & O * 3; sounds in no acute distress Respiratory: speaking in full sentences, no conversational dyspnea Psych: insight appears good, mood- appears full

## 2020-03-14 DIAGNOSIS — R3912 Poor urinary stream: Secondary | ICD-10-CM | POA: Diagnosis not present

## 2020-03-14 DIAGNOSIS — R351 Nocturia: Secondary | ICD-10-CM | POA: Diagnosis not present

## 2020-03-14 DIAGNOSIS — N401 Enlarged prostate with lower urinary tract symptoms: Secondary | ICD-10-CM | POA: Diagnosis not present

## 2020-03-27 ENCOUNTER — Ambulatory Visit (INDEPENDENT_AMBULATORY_CARE_PROVIDER_SITE_OTHER): Payer: Medicare HMO | Admitting: Counselor

## 2020-03-27 ENCOUNTER — Encounter: Payer: Self-pay | Admitting: Counselor

## 2020-03-27 ENCOUNTER — Other Ambulatory Visit: Payer: Self-pay

## 2020-03-27 ENCOUNTER — Ambulatory Visit: Payer: Medicare HMO

## 2020-03-27 DIAGNOSIS — F09 Unspecified mental disorder due to known physiological condition: Secondary | ICD-10-CM

## 2020-03-27 DIAGNOSIS — R454 Irritability and anger: Secondary | ICD-10-CM | POA: Diagnosis not present

## 2020-03-27 DIAGNOSIS — R69 Illness, unspecified: Secondary | ICD-10-CM | POA: Diagnosis not present

## 2020-03-27 NOTE — Progress Notes (Signed)
   Psychometrist Note   Cognitive testing was administered to George [name] by Lamar Benes, B.S. (Technician) under the supervision of Alphonzo Severance, Psy.D., ABN. George [name] was able to tolerate all test procedures. Dr. Nicole Kindred met with the patient as needed to manage any emotional reactions to the testing procedures. Rest breaks were offered.    The battery of tests administered was selected by Dr. Nicole Kindred with consideration to the patient's current level of functioning, the nature of his symptoms, emotional and behavioral responses during the interview, level of literacy, observed level of motivation/effort, and the nature of the referral question. This battery was communicated to the psychometrist. Communication between Dr. Nicole Kindred and the psychometrist was ongoing throughout the evaluation and Dr. Nicole Kindred was immediately accessible at all times. Dr. Nicole Kindred provided supervision to the technician on the date of this service, to the extent necessary to assure the quality of all services provided.    George [name] will return in approximately one week for an interactive feedback session with Dr. Nicole Kindred, at which time test performance, clinical impressions, and treatment recommendations will be reviewed in detail. The patient understands he can contact our office should he require our assistance before this time.   A total of 90 minutes of billable time were spent with George [name] by the technician, including test administration and scoring time. Billing for these services is reflected in Dr. Les Pou note.   This note reflects time spent with the psychometrician and does not include test scores, clinical history, or any interpretations made by Dr. Nicole Kindred. The full report will follow in a separate note.

## 2020-03-27 NOTE — Progress Notes (Signed)
[facility]  Patient Name: George [name] MRN: [id number] Date of Birth: 07/05/1950 Age: 70 y.o. Education: 12 years  Measurement properties of test scores: IQ, Index, and Standard Scores (SS): Mean = 100; Standard Deviation = 15 Scaled Scores (Ss): Mean = 10; Standard Deviation = 3 Z scores (Z): Mean = 0; Standard Deviation = 1 T scores (T); Mean = 50; Standard Deviation = 10  TEST SCORES:    Note: This summary of test scores accompanies the interpretive report and should not be interpreted by unqualified individuals or in isolation without reference to the report. Test scores are relative to age, gender, and educational history as available and appropriate.   Performance Validity        "A" Random Letter Test Raw  Descriptor      Errors 1 Within Expectation  The Dot Counting Test: 11 Within Expectation  NAB Effort Index 3 Below Expectation      Expected Functioning        Wide Range Achievement Test: Standard/Scaled Score Percentile      Word Reading 88 21      Reynolds Intellectual Screening Test Standard/T-score Percentile      Guess What 47 38      Odd Item Out 46 34  RIST Index 95 37      Attention/Processing Speed        Neuropsychological Assessment Battery (Attention Module, Form 1): T-score Percentile      Digits Forward 35 7      Digits Backwards 46 34      Repeatable Battery for the Assessment of Neuropsychological Status (Form A): Scaled Score Percentile      Coding 5 5      Language        Neuropsychological Assessment Battery (Language Module, Form 1): T-score Percentile      Naming   (31) 57 75      Verbal Fluency: T-score Percentile      Controlled Oral Word Association (F-A-S) 41 18      Semantic Fluency (Animals) 54 66      Memory:        Neuropsychological Assessment Battery (Memory Module, Form 1): T-score Percentile      List Learning           List A Immediate Recall   (5, 5, 7) 45 31          List B Immediate Recall   (3) 46 34         List A Short Delayed Recall   (7) 54 66         List A Long Delayed Recall   (4) 42 21         List A Percent Retention   (57 %) --- 8         List A Long Delayed Yes/No Recognition Hits   (10) --- 31         List A Long Delayed Yes/No Recognition False Alarms   (6) --- 21         List A Recognition Discriminability Index --- 16      Story Learning           Immediate Recall   (9, 11) 25 1         Delayed Recall   (15) 36 8         Percent Retention   (136 %) --- 99      Daily Living  Memory            Immediate Recall   (20, 22) 55 69          Delayed Recall   (7, 5) 46 34          Percent Retention (86 %) --- 46          Recognition Hits    (9) --- 58      Repeatable Battery for the Assessment of Neuropsychological Status (Form A): Scaled Score Percentile         Figure Recall   (10) 8 25      Visuospatial/Constructional Functioning        Repeatable Battery for the Assessment of Neuropsychological Status (Form A): Standard/Scaled Score Percentile     Visuospatial/Constructional Index 87 19         Figure Copy   (16) 6 9         Judgment of Line Orientation   (17) --- 51-75      Executive Functioning        [employer] Test (MWCST): Standard/T-Score Percentile      Number of Categories Correct 40 16      Number of Perseverative Errors 41 18      Number of Total Errors 41 18      Percent Perseverative Errors 44 27  Executive Function Composite 84 14      Trail Making Test: T-Score Percentile      Part A 40 16      Part B 44 27      [place] Diagnostic Aphasia Exam: Raw Score Scaled Score      Complex Ideational Material 11 9      Rating Scales        Clinical Dementia Rating Raw Score Descriptor      Sum of Boxes 1.0 MCI      Global Score 0.5 MCI       Raw Score Descriptor  Geriatric Depression Scale - Short Form 2 Negative   Ashling Roane V. Nicole Kindred PsyD, [place] Clinical Neuropsychologist

## 2020-03-27 NOTE — Progress Notes (Signed)
[facility]  Patient Name: George [name]: [id number] Date of Birth: 1951/03/05 Age: 70 y.o. Education: 12 years  Referral Circumstances and Background Information  George [name] is a 70 y.o., right-hand dominant, married man with a history of bilateral bundle branch block s/p pacemaker placement, B12 deficiency and memory loss over the last couple of years. His wife noticed his changes after his pacemaker placement, 4 years ago, and feels as though his difficulties have been worsening over time. In addition to taking longer to process things and forgetting names, he has some minor changes in personality in terms of "snapping" at everybody. He was seen recently by Dr. Delice Lesch who referred him for neuropsychological evaluation and documented a MoCA of 25/30 at his last visit (02/08/2020). Additionally there is a strong maternal history of dementia with his mother, maternal grandparents, and maternal uncle all developing the condition.   On interview, the patient stated that he is not particularly worried about his memory and thinking. He presented to the appointment alone although I was eventually able to speak to his wife on the phone. He stated that his mother hand Alzheimer's disease, and so his wife worries he will develop the condition. In terms of symptoms, he will sometimes misplace tools or forget what he went out to the truck for, he has a harder time with names. He also notices that his "attitude has changed." He used to be very easy going and now, any little thing ticks him off. He notices feeling irritated and has good empathy, stating that he knows it "isn't right" and he would like to stop. He was placed on Prozac, and he thinks that is helping. On specific review of symptoms, he denied repeating himself, he denied problems with orientation, he has problems with word finding "once in a while" but not a lot, he does have problems with  maintaining his thought process. On interview his wife stated that he has recently been forgetting to take his medicine, he thinks he has told her things and he hasn't, and he has a hard time recalling the steps for getting her into her CPM machine (recently had a total knee replacement). With respect to mood, the patient stated that "most of the time, I'm content." His wife stated there has been a big improvement with Prozac. His energy is up and down, not what it was. He stated that he is sleeping adequately, typically 7 hours in bed and a few extra hours in his chair before he gets in bed. There is no dream enactment behavior.   With respect to functioning, the patient and his wife stated that there is nothing that his memory problems prevent him from doing now. 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 His wife does think it takes him "longer to process things" than in the past, and by that she means he has to plan more and isn't as quick with his decisions and planning. His wife manages money and always has. With respect to medications, the patient organizes and takes them himself, with some errors, but he is fairly reliable. Hisd wife stated that he is doing a good job taking care of her, with the exceptions noted above. He is doing fine with driving and his wife isn't worried about his driving. He has no complex hobbies, he used to play guitar a lot  but hasn't done that in years.   Past Medical History and Review of Relevant Studies   Patient Active Problem List   Diagnosis Date Noted  . S/P arthroscopy of right shoulder 02/09/2019  . Rotator cuff tear arthropathy of right shoulder 01/14/2019  . Impingement syndrome of right shoulder 01/14/2019  . VT (ventricular tachycardia) ([place]) 11/12/2018  . Environmental and seasonal allergies 06/09/2018  . Senile solar keratosis 05/26/2018  .  Contact dermatitis 05/26/2018  . Eczema of eyelid 05/26/2018  . Inguinal hernia of right side without obstruction or gangrene 05/26/2018  . CAD (coronary artery disease), native coronary artery 01/30/2018  . Coronary artery aneurysm 01/30/2018  . Other male erectile dysfunction- "hx of fractured penis requiring sx" 05/20/2017  . Hyperlipidemia LDL goal <100 05/13/2017  . Dizziness 08/21/2016  . BBBB (bilateral bundle branch block) 10/18/2015  . Complete heart block ([place]) 10/18/2015  . Vitamin B12 deficiency 10/06/2015  . Vitamin D insufficiency 10/06/2015  . Bradycardia 10/06/2015  . Hereditary hyperbilirubinemia 10/06/2015  . H/O noncompliance with medical treatment, presenting hazards to health 09/19/2015  . Peyronie's disease 09/19/2015  . DOE (dyspnea on exertion) 09/19/2015  . Unexplained weight loss 09/14/2015  . Essential hypertension 09/14/2015  . h/o Non-compliance: Has not seen a doc in over 13 years 09/14/2015  . Fatigue 09/14/2015  . History of nonadherence to medical treatment 09/14/2015   Review of Neuroimaging and Relevant Medical History: The patient has a CT head from 03/03/2020 (cannot get MRI due to PPM), which shows some mild volume loss in the L > R cerebral hemispheres, borderline abnormal appearing for his age. There is a subtle impression of somewhat more volume loss in the frontal and parietal lobes as compared to the temporal lobes. Punctate foci of hypodensity in the right pons and left frontal lobe likely represent remote, tiny lacunar type infarctions. No significant areas of leukoaraiosis. Overall, the imaging is nonspecific.   Montreal Cognitive Assessment  02/08/2020  Visuospatial/ Executive (0/5) 4  Naming (0/3) 3  Attention: Read list of digits (0/2) 2  Attention: Read list of letters (0/1) 1  Attention: Serial 7 subtraction starting at 100 (0/3) 3  Language: Repeat phrase (0/2) 1  Language : Fluency (0/1) 1  Abstraction (0/2) 0  Delayed Recall  (0/5) 3  Orientation (0/6) 6  Total 24  Adjusted Score (based on education) 25   Current Outpatient Medications  Medication Sig Dispense Refill  . atorvastatin (LIPITOR) 40 MG tablet Take 1 tablet (40 mg total) by mouth daily at 6 PM. 90 tablet 2  . carvedilol (COREG) 25 MG tablet TAKE 1 TABLET BY MOUTH TWICE A DAY WITH MEALS 180 tablet 1  . Cholecalciferol (VITAMIN D3) 5000 units TABS 5,000 IU OTC vitamin D3 daily. 90 tablet 3  . clotrimazole-betamethasone (LOTRISONE) cream Apply 1 application topically 2 (two) times daily. 45 g 0  . fexofenadine (ALLEGRA) 180 MG tablet Take 1 tablet (180 mg total) by mouth daily. During allergy season 180 tablet 0  . FLUoxetine (PROZAC) 10 MG capsule Take 1 capsule (10 mg total) by mouth daily. 90 capsule 0  . fluticasone (FLONASE) 50 MCG/ACT nasal spray USE 1 SPRAY IN EACH NOSTRIL AFTER SINUS RINSES TWICE DAILY. 48 mL 1  . losartan (COZAAR) 25 MG tablet TAKE 1 TABLET BY MOUTH EVERY DAY 90 tablet 2  . Magnesium 250 MG TABS Take 250 mg by mouth daily.    . Multiple Vitamins-Minerals (CENTRUM ADULTS) TABS Take 1 tablet by mouth daily.    Marland Kitchen  sildenafil (REVATIO) 20 MG tablet Take 20-100 mg by mouth daily as needed.    . tamsulosin (FLOMAX) 0.4 MG CAPS capsule Take 0.4 mg by mouth daily.    . vitamin B-12 (CYANOCOBALAMIN) 1000 MCG tablet Take 1,000 mcg by mouth daily.    . Zinc 50 MG TABS Take by mouth. (Patient not taking: Reported on 03/08/2020)     No current facility-administered medications for this visit.   Family History  Problem Relation Age of Onset  . Diabetes Mother   . Hypertension Mother   . Hyperlipidemia Mother   . Heart disease Father   . Hyperlipidemia Father   . Hypertension Father   . Arrhythmia Father        paf  . Healthy Daughter   . Healthy Son   . Heart disease Paternal Grandfather   . Heart attack Paternal Grandfather    There is a family history of dementia. His mother had dementia that was diagnosed as Alzheimer's  disease, when she was in her mid 70s. His maternal grandfather also developed dementia, it was later in life. His father allegedly has dementia, which "came out" when his wife died, he just turned 48. It sounds mild.There is a family history of psychiatric illness. His mother was depressed, his brother was depressed, and his sister has "some kind of depression thing."   Psychosocial History  Developmental, Educational and Employment History: The patient reported a normal childhood development with on time attainment of milestones and denied any abuse or neglect. He stated that in school, he did "fairly well" and denied any learning difficulties or ever being held back. 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 He has no other employees. He works about 30 hours a week on average.   Psychiatric History: The patient has no previous psychiatric history, he is on Prozac recently related to his irritability.   Substance Use History: He doesn't consume alcohol, he doesn't smoke, and he doesn't use any illicit drugs.   Relationship History and Living Cimcumstances: The patient and his wife have been married for 48 years. They have a son and a daughter, they live locally, they have noticed the changes (particularly his son, they do a lot of work together).   Mental Status and Behavioral Observations  Sensorium/Arousal: The patient's level of arousal was awake and alert. Hearing and vision were adequate with correction (wore glasses) for testing purposes. Orientation: The patient was alert and fully oriented to person, place, time, and situation.  Appearance: Dressed in appropriate casual clothing with reasonable grooming and hygiene. Behavior: Pleasant, appropriate Speech/language: Normal in rate, rhythm, volume, and prosody. No  word finding pauses, paraphasias, or speech praxis problems.  Gait/Posture: Appeared normal on casual observation Movement: No tremors, bradykinesia, or other suggestive motor signs Social Comportment: Appropriate Mood: "Most of the time, I'm content" Affect: Mainly neutral Thought process/content: Thought process was logical and goal oriented, he had no difficulties providing a fairly details and reasonable accurate history, when cross referenced with his wife's report. Thought content was appropriate and pertinent.  Safety: Denied any thoughts of harming self or others when asked directly.  Insight: Fair  Mental status testing deferred (just had MoCA recently)  Test Procedures  Wide Range Achievement Test - 4  Word Reading Doy Mince' Intellectual Screening Test Neuropsychological Assessment Battery  List Learning  Story Learning  Daily Living Memory  Naming  Digit Span Repeatable Battery for the Assessment of Neuropsychological Status (Form A)  Figure Copy  Judgment of Line Orientation  Coding  Figure Recall The Dot Counting Test A Random Letter Test Sentence Repetition (UDS-3) Aural Comprehension ([place]) Controlled Oral Word Association (F-A-S) Conservation officer, historic buildings (Animals) Trail Making Test A & B Complex Ideational Material Modified [place] Card Sorting Test Geriatric Depression Scale - Short Form  Plan  George [name] was seen for a psychiatric diagnostic evaluation and neuropsychological testing. He is a pleasant, 70 year old, right-hand dominant married man with a history of cognitive problems over the past 4 years that sound very subtle. He admits that his wife is concerned because of his family history, his issues are quite subtle at this point, and he is still able to work nearly full time. They do feel like he forgets things from time to time and is not as quick to "process" things as he once was. Full and complete note with impressions, recommendations,  and interpretation of test data to follow.   George Simas Nicole Kindred, PsyD, [place] Clinical Neuropsychologist  Informed Consent and Coding/Compliance  Risks and benefits of the evaluation were discussed with the patient prior to all testing procedures. I conducted a clinical interview and neuropsychological testing (at least two tests) with Soledad Gerlach and Lamar Benes, B.S. (Technician) administered additional test procedures. The patient was able to tolerate the testing procedures and the patient (and/or family if applicable) is likely to benefit from further follow up to receive the diagnosis and treatment recommendations, which will be rendered at the next encounter. Billing below reflects technician time, my direct face-to-face time with the patient, time spent in test administration, and time spent in professional activities including but not limited to: neuropsychological test interpretation, integration of neuropsychological test data with clinical history, report preparation, treatment planning, care coordination, and review of diagnostically pertinent medical history or studies.   Services associated with this encounter: Clinical Interview [phone]) plus 60 minutes (66063; Neuropsychological Evaluation by Professional)  110 minutes (01601; Neuropsychological Evaluation by Professional, Adl.) 16 minutes (09323; Test Administration by Professional) 30 minutes (55732; Neuropsychological Testing by Technician) 60 minutes (20254; Neuropsychological Testing by Technician, Adl.)

## 2020-04-03 ENCOUNTER — Ambulatory Visit (INDEPENDENT_AMBULATORY_CARE_PROVIDER_SITE_OTHER): Payer: Medicare HMO | Admitting: Counselor

## 2020-04-03 ENCOUNTER — Encounter: Payer: Self-pay | Admitting: Counselor

## 2020-04-03 ENCOUNTER — Other Ambulatory Visit: Payer: Self-pay

## 2020-04-03 DIAGNOSIS — R69 Illness, unspecified: Secondary | ICD-10-CM | POA: Diagnosis not present

## 2020-04-03 DIAGNOSIS — F4320 Adjustment disorder, unspecified: Secondary | ICD-10-CM | POA: Insufficient documentation

## 2020-04-03 DIAGNOSIS — F4329 Adjustment disorder with other symptoms: Secondary | ICD-10-CM | POA: Diagnosis not present

## 2020-04-03 DIAGNOSIS — G3184 Mild cognitive impairment, so stated: Secondary | ICD-10-CM

## 2020-04-03 NOTE — Progress Notes (Signed)
NEUROPSYCHOLOGY TELEMEDICINE NOTE [facility]  Telemedicine statement:  I discussed the limitations of neuropsychological care via telemedicine and the availability of in person appointments. The patient expressed understanding and agreed to proceed. The patient was verified with two identifiers.  The visit modality was: telephonic The patient location was: home The provider location was: home  The following individuals participated: Jaymien Landin  Feedback Note: I met with Soledad Gerlach to review the findings resulting from his neuropsychological evaluation. Since the last appointment, he has been about the same. Time was spent reviewing the impressions and recommendations that are detailed in the evaluation report. We discussed impression of very mild cognitive difificulties, although in his case I am not entirely sure that this represents any condition at risk for decline. Nevertheless, he should follow up in 1 to 2 years to make sure he is not worsening. We reviewed dietary and lifestyle changes for cognitive impairment. I took time to explain the findings and answer all the patient's questions. I encouraged Mr. Wolfert to contact me should he have any further questions or if further follow up is desired.   Current Medications and Medical History   Current Outpatient Medications  Medication Sig Dispense Refill  . atorvastatin (LIPITOR) 40 MG tablet Take 1 tablet (40 mg total) by mouth daily at 6 PM. 90 tablet 2  . carvedilol (COREG) 25 MG tablet TAKE 1 TABLET BY MOUTH TWICE A DAY WITH MEALS 180 tablet 1  . Cholecalciferol (VITAMIN D3) 5000 units TABS 5,000 IU OTC vitamin D3 daily. 90 tablet 3  . clotrimazole-betamethasone (LOTRISONE) cream Apply 1 application topically 2 (two) times daily. 45 g 0  . fexofenadine (ALLEGRA) 180 MG tablet Take 1 tablet (180 mg total) by mouth daily. During allergy season 180 tablet 0  . FLUoxetine (PROZAC) 10 MG capsule Take 1  capsule (10 mg total) by mouth daily. 90 capsule 0  . fluticasone (FLONASE) 50 MCG/ACT nasal spray USE 1 SPRAY IN EACH NOSTRIL AFTER SINUS RINSES TWICE DAILY. 48 mL 1  . losartan (COZAAR) 25 MG tablet TAKE 1 TABLET BY MOUTH EVERY DAY 90 tablet 2  . Magnesium 250 MG TABS Take 250 mg by mouth daily.    . Multiple Vitamins-Minerals (CENTRUM ADULTS) TABS Take 1 tablet by mouth daily.    . sildenafil (REVATIO) 20 MG tablet Take 20-100 mg by mouth daily as needed.    . tamsulosin (FLOMAX) 0.4 MG CAPS capsule Take 0.4 mg by mouth daily.    . vitamin B-12 (CYANOCOBALAMIN) 1000 MCG tablet Take 1,000 mcg by mouth daily.    . Zinc 50 MG TABS Take by mouth. (Patient not taking: Reported on 03/08/2020)     No current facility-administered medications for this visit.    Patient Active Problem List   Diagnosis Date Noted  . S/P arthroscopy of right shoulder 02/09/2019  . Rotator cuff tear arthropathy of right shoulder 01/14/2019  . Impingement syndrome of right shoulder 01/14/2019  . VT (ventricular tachycardia) ([place]) 11/12/2018  . Environmental and seasonal allergies 06/09/2018  . Senile solar keratosis 05/26/2018  . Contact dermatitis 05/26/2018  . Eczema of eyelid 05/26/2018  . Inguinal hernia of right side without obstruction or gangrene 05/26/2018  . CAD (coronary artery disease), native coronary artery 01/30/2018  . Coronary artery aneurysm 01/30/2018  . Other male erectile dysfunction- "hx of fractured penis requiring sx" 05/20/2017  . Hyperlipidemia LDL goal <100 05/13/2017  . Dizziness 08/21/2016  . BBBB (bilateral bundle branch block)  10/18/2015  . Complete heart block ([place]) 10/18/2015  . Vitamin B12 deficiency 10/06/2015  . Vitamin D insufficiency 10/06/2015  . Bradycardia 10/06/2015  . Hereditary hyperbilirubinemia 10/06/2015  . H/O noncompliance with medical treatment, presenting hazards to health 09/19/2015  . Peyronie's disease 09/19/2015  . DOE (dyspnea on exertion) 09/19/2015   . Unexplained weight loss 09/14/2015  . Essential hypertension 09/14/2015  . h/o Non-compliance: Has not seen a doc in over 13 years 09/14/2015  . Fatigue 09/14/2015  . History of nonadherence to medical treatment 09/14/2015    Mental Status and Behavioral Observations  YANIEL LIMBAUGH was available at the prespecified time for this telephonic appointment and was alert and generally oriented (orientation not formally assessed). Speech was normal in rate, rhythm, volume, and prosody. Self-reported mood was "good" and affect as assessed by vocal quality was euthymic. Thought process was logical and goal oriented and thought content was appropriate. There were no safety concerns identified at today's encounter, such as thoughts of harming self or others.   Plan  Feedback provided regarding the patient's neuropsychological evaluation. He was relieved that his test data are not particularly convincing for dementia or a condition clearly at risk for decline at the present time, although he should follow up in 1 to 2 years to make sure. DAULTON HARBAUGH was encouraged to contact me if any questions arise or if further follow up is desired.   Viviano Simas Nicole Kindred, PsyD, ABN Clinical Neuropsychologist  Service(s) Provided at This Encounter: 31 minutes [phone]; Conjoint therapy with patient present)

## 2020-04-03 NOTE — Progress Notes (Signed)
[facility]  Patient Name: George [name] MRN: [id number] Date of Birth: 1951-02-19 Age: 70 y.o. Education: 12 years  Clinical Impressions  DANA DORNER is a 70 y.o., right-hand dominant, married man with a history of complete heart block s/p PPM, B12 deficiency, and memory loss that his wife noticed after his pacemaker placement about 4 years ago. They think his issues have been slowly worsening over time. In addition to cognitive problems, they also appreciate some minor irritability, although he is doing better since starting Prozac recently. He has a CT of the head that shows L > R volume loss in the parenchyma, with a subtle impression of more in the frontal and parietal lobes than elsewhere in the brain. He also has some tiny lacunar infarcts.    Neuropsychological testing shows fairly benign scores. While Mr. Marti did technically perform marginally below expectations for an individual of his measured overall ability on measures of memory (I.e., the number of low scores he produced would be expected in < 10% of normal individuals), the profile is more concerning for inconsistency and encodng problems than it is a developing storage problem. He generally retained information very well across time and had only mild difficulties with encoding on select indicators. In addition to memory there were a few scattered low scores elsewhere including on figure copy (due to inattention to detail), sentence repetition, digit repetition forward, and on timed number-symbol coding. He did well on tests viewed as sensitive to Alzheimer's type problems including animal naming and visual object confrontation naming.   The presentation is consistent with some cognitive issues that may represent a decline for Mr. Fjelstad but they are quite subtle. I also wonder if premorbid strengths and weaknesses may be contributing. Nevertheless, he should follow up for further evaluation  in 1 to 2 years to make sure that nothing is being missed. It is possible that he has some impairment from his pacemaker placement, although reviewing the notes it seems that his saturations remained adequate during the procedure and he underwent only conscious sedation, so the mechanism for that would not be entirely clear. He may also have some disturbance from his mood symptoms. The possibility of an underlying condition cannot be entirely ruled out but it is not clear if that is a significant factor at this point in time.   Diagnostic Impressions: Mild cognitive impairment Adjustment disorder, unspecified  Recommendations to be discussed with patient  Your performance and presentation on neuropsychological assessment are overall fairly benign. That is to say, you did not have any findings that were concerning for the types of impairment that I tend to expect in Alzheimer's disease or other common causes of cognitive impairment in individuals your age. You did have some scattered low scores on memory measures and in other areas that may be picking up on the day-to-day complaints that you notice. I cannot entirely rule out that the changes you notice are due to an underlying condition, but I think it is equally possible that they are due to minor mood disturbance, normal age related changes, and the like. Nevertheless, because your test performance is less than expected and you and your wife notice changes, a diagnosis of mild cognitive impairment is appropriate.   The major difference between mild cognitive impairment (MCI) and dementia is in severity and potential prognosis. Once someone reaches a level of severity adequate to be diagnosed with a dementia, there is usually progression over time, though this may be years.  On the other hand, mild cognitive impairment, while a significant risk for dementia in future, does not always progress to dementia, and in some instances stays the same or can even  revert to normal. It is important to realize that if MCI is due to underlying Alzheimer's disease, it will most likely progress to dementia eventually. The rate of conversion to Alzheimer's dementia from amnestic MCI is about 15% per year versus the general population risk of conversion of 2% per year.   In your case I am not sure at all that your difficulties are due to Alzheimer's disease and in fact think that they may not be. Nevertheless, it would be prudent to come back for repeat evaluation in 1 to 2 years to make sure you are not worsening.   There is no need to start antidementia medication at this time, because you do not have dementia.   I would recommend that you use behavioral strategies to manage your memory symptoms.   The following compensatory strategies may be helpful for managing day-to-day memory symptoms: . Minimize distractions and interruptions to the extent possible. Be an active observer, present-minded, and focus attention. Focus on only one task for a period of time.  . Get organized. Establish routines and stick to them. Make and use checklists.  . Use external memory aids as needed, such as a planner and notebook. Repetition, written reminders, and keeping a calendar of appointments may be helpful. Charlene Brooke a place to keep your keys, wallet, cell phone, and other personal belongings.  . Break down tasks into smaller steps to help get started and to keep from feeling overwhelmed.  . Increase your success learning information by breaking it into manageable chunks, connecting it to previously learned information, or forming associations with what you are trying to remember.   There are also lifestyle changes you can make that may reduce your chance of developing dementia in the future, if your difficulties are due to an underlying condition.   There is now good quality evidence from at least one large scale study that a modified mediterranean diet may help slow  cognitive decline. This is known as the "MIND" diet. The Mind diet is not so much a specific diet as it is a set of recommendations for things that you should and should not eat.   Foods that are ENCOURAGED on the MIND Diet:  Green, leafy vegetables: Aim for six or more servings per week. This includes kale, spinach, cooked greens and salads.  All other vegetables: Try to eat another vegetable in addition to the green leafy vegetables at least once a day. It is best to choose non-starchy vegetables because they have a lot of nutrients with a low number of calories.  Berries: Eat berries at least twice a week. There is a plethora of research on strawberries, and other berries such as blueberries, raspberries and blackberries have also been found to have antioxidant and brain health benefits.  Nuts: Try to get five servings of nuts or more each week. The creators of the MIND diet don't specify what kind of nuts to consume, but it is probably best to vary the type of nuts you eat to obtain a variety of nutrients. Peanuts are a legume and do not fall into this category.  Olive oil: Use olive oil as your main cooking oil. There may be other heart-healthy alternatives such as algae oil, though there is not yet sufficient research upon which to base a formal recommendation.  Whole grains: Aim for at least three servings daily. Choose minimally processed grains like oatmeal, quinoa, brown rice, whole-wheat pasta and 100% whole-wheat bread.  Fish: Eat fish at least once a week. It is best to choose fatty fish like salmon, sardines, trout, tuna and mackerel for their high amounts of omega-3 fatty acids.  Beans: Include beans in at least four meals every week. This includes all beans, lentils and soybeans.  Poultry: Try to eat chicken or [place] at least twice a week. Note that fried chicken is not encouraged on the MIND diet.  Wine: Aim for no more than one glass of alcohol daily. Both red and white wine may  benefit the brain. However, much research has focused on the red wine compound resveratrol, which may help protect against Alzheimer's disease.  Foods that are DISCOURAGED on the MIND Diet: Butter and margarine: Try to eat less than 1 tablespoon (about 14 grams) daily. Instead, try using olive oil as your primary cooking fat, and dipping your bread in olive oil with herbs.  Cheese: The MIND diet recommends limiting your cheese consumption to less than once per week.  Red meat: Aim for no more than three servings each week. This includes all beef, pork, lamb and products made from these meats.  [place] food: The MIND diet highly discourages fried food, especially the kind from fast-food restaurants. Limit your consumption to less than once per week.  Pastries and sweets: This includes most of the processed junk food and desserts you can think of. Ice cream, cookies, brownies, snack cakes, donuts, candy and more. Try to limit these to no more than four times a week.  Exercise is one of the best medicines for promoting health and maintaining cognitive fitness at all stages in life. Exercise probably has the largest documented effect on brain health and performance of any lifestyle intervention. Studies have shown that even previously sedentary individuals who start exercising as late as age 53 show a significant survival benefit as compared to their non-exercising peers. In the [place], the current guidelines are for 30 minutes of moderate exercise per day, but increasing your activity level less than that may also be helpful. You do not have to get your 30 minutes of exercise in one shot and exercising for short periods of time spread throughout the day can be helpful. Go for several walks, learn to dance, or do something else you enjoy that gets your body moving. Of course, if you have an underlying medical condition or there is any question about whether it is safe for you to exercise, you should  consult a medical treatment provider prior to beginning exercise.   I would like you to know that having a few family members with late onset dementia does not appreciably increase your risk of developing the condition. Over 30% of individuals 80+ will have dementia, and thus if many of your family members lived into old age, some of them will likely have dementia. There is no pattern concerning for an autosomal dominant condition or other heritable concerning cause for Alzheimer's.   Test Findings  Test scores are summarized in additional documentation associated with this encounter. Test scores are relative to age, gender, and educational history as available and appropriate. There were no concerns about performance validity as all findings fell within normal expectations.   General Intellectual Functioning/Achievement:  Performance on single word reading was low average whereas his performance on the RIST index was average, average presents as a reasonable  standard of comparison for his cognitive test scores.   Attention and Processing Efficiency: Performance was weak on measures of attention with unusually low digit repetition forward. Interestingly, he then did better, in the average range on digit repetition backward.   Timed measures of processing speed were also weak, with an unusually low score on timed number-symbol coding. Simple numeric sequencing was low average.  Language: Performance on language measures was within normal limits. Visual object confrontation naming was errorless. Generation of words in response to the letters F-A-S was low average, whereas generation of animals in one minute was average.   Visuospatial Function: Performance was reasonable at an index level on measures of visuoconstructional and perceptual abilities, with a low average score. He did demonstrate unusually low figure copy, but that was likely due to inattention to detail as opposed to any actual  visuospatial impairment. Judgment of angular line orientations was average to high average.   Learning and Memory: Performance on memory measures was less than expected, although the pattern shows difficulties with encoding on story learning with good retention of information across time and good performance elsewhere. This is not suggestive of memory storage type problems.   In the verbal realm, his immediate recall for a word list was 5, 5, and 7 words of a 12-item list across three learning trials followed by 7 words at short delayed recall and 4 words at long delayed recall, which is average to low average performance. Delayed recognition discriminability was low average. He had difficulty on short story learning with an extremely low score for immediate recall but retained the information well with hypernesia and then demonstrated an unusually low (almost low average) score on delayed recall. Memory for brief daily-living type information was average for immediate and delayed recall with comparable average recognition.   In the visual realm, delayed recall of a modestly complex figure was average.   Executive Functions: Performance on dedicated executive measures was generally adequate although his low processing speed, difficulties with attention, poor attention to detail on figure copy, and low digit repetition forward scores are suggestive of attention and executive control problems. Modified [employer] was low average on the [employer] Composite Index. Generation of words in response to given letters was low average. Performance was average on the challenging Trail Making B test. He demonstrated an average score when reasoning with verbal information on the Complex [employer].   Rating Scale(s): Mr. Swander reported minimal levels of symptoms associated with depression. I rated a CDR for him and would place him at an MCI level, simply because he complained of  memory problems, making his global score 0.5. His Sum of Boxes is only 1.0, which is questionable impairment at best.   Viviano Simas. Nicole Kindred PsyD, [place] Clinical Neuropsychologist

## 2020-04-03 NOTE — Patient Instructions (Signed)
Your performance and presentation on neuropsychological assessment are overall fairly benign. That is to say, you did not have any findings that were concerning for the types of impairment that I tend to expect in Alzheimer's disease or other common causes of cognitive impairment in individuals your age. You did have some scattered low scores on memory measures and in other areas that may be picking up on the day-to-day complaints that you notice. I cannot entirely rule out that the changes you notice are due to an underlying condition, but I think it is equally possible that they are due to minor mood disturbance, normal age related changes, and the like. Nevertheless, because your test performance is less than expected and you and your wife notice changes, a diagnosis of mild cognitive impairment is appropriate.   The major difference between mild cognitive impairment (MCI) and dementia is in severity and potential prognosis. Once someone reaches a level of severity adequate to be diagnosed with a dementia, there is usually progression over time, though this may be years. On the other hand, mild cognitive impairment, while a significant risk for dementia in future, does not always progress to dementia, and in some instances stays the same or can even revert to normal.It is important to realize that if MCI is due to underlying Alzheimer's disease, it will most likely progress to dementia eventually. The rate of conversion to Alzheimer's dementiafrom amnestic MCI is about 15% per year versus the general population risk of conversion of 2% per year.  In your case I am not sure at all that your difficulties are due to Alzheimer's disease and in fact think that they may not be. Nevertheless, it would be prudent to come back for repeat evaluation in 1 to 2 years to make sure you are not worsening.   There is no need to start antidementia medication at this time, because you do not have dementia.   I would  recommend that you use behavioral strategies to manage your memory symptoms.   The following compensatory strategies may be helpful for managing day-to-day memory symptoms:  Minimize distractions and interruptions to the extent possible. Be an active observer, present-minded, and focus attention. Focus on only one task for a period of time.   Get organized. Establish routines and stick to them. Make and use checklists.   Use external memory aids as needed, such as a planner and notebook. Repetition, written reminders, and keeping a calendar of appointments may be helpful.  Designate a place to keep your keys, wallet, cell phone, and other personal belongings.   Break down tasks into smaller steps to help get started and to keep from feeling overwhelmed.   Increase your success learning information by breaking it into manageable chunks, connecting it to previously learned information, or forming associations with what you are trying to remember.   There are also lifestyle changes you can make that may reduce your chance of developing dementia in the future, if your difficulties are due to an underlying condition.   There is now good quality evidence from at least one large scale study that a modified mediterranean diet may help slow cognitive decline. This is known as the "MIND" diet. The Mind diet is not so much a specific diet as it is a set of recommendations for things that you should and should not eat.   Foods that are ENCOURAGED on the MIND Diet:  Green, leafy vegetables: Aim for six or more servings per week. This includes kale,  spinach, cooked greens and salads.  All other vegetables: Try to eat another vegetable in addition to the green leafy vegetables at least once a day. It is best to choose non-starchy vegetables because they have a lot of nutrients with a low number of calories.  Berries: Eat berries at least twice a week. There is a plethora of research on strawberries, and  other berries such as blueberries, raspberries and blackberries have also been found to have antioxidant and brain health benefits.  Nuts: Try to get five servings of nuts or more each week. The creators of the [place] don't specify what kind of nuts to consume, but it is probably best to vary the type of nuts you eat to obtain a variety of nutrients. Peanuts are a legume and do not fall into this category.  Olive oil: Use olive oil as your main cooking oil. There may be other heart-healthy alternatives such as algae oil, though there is not yet sufficient research upon which to base a formal recommendation.  Whole grains: Aim for at least three servings daily. Choose minimally processed grains like oatmeal, quinoa, brown rice, whole-wheat pasta and 100% whole-wheat bread.  Fish: Eat fish at least once a week. It is best to choose fatty fish like salmon, sardines, trout, tuna and mackerel for their high amounts of omega-3 fatty acids.  Beans: Include beans in at least four meals every week. This includes all beans, lentils and soybeans.  Poultry: Try to eat chicken or [place] at least twice a week. Note that fried chicken is not encouraged on the MIND diet.  Wine: Aim for no more than one glass of alcohol daily. Both red and white wine may benefit the brain. However, much research has focused on the red wine compound resveratrol, which may help protect against Alzheimer's disease.  Foods that are DISCOURAGED on the MIND Diet: Butter and margarine: Try to eat less than 1 tablespoon (about 14 grams) daily. Instead, try using olive oil as your primary cooking fat, and dipping your bread in olive oil with herbs.  Cheese: The MIND diet recommends limiting your cheese consumption to less than once per week.  Red meat: Aim for no more than three servings each week. This includes all beef, pork, lamb and products made from these meats.  [place] food: The MIND diet highly discourages fried food, especially the  kind from fast-food restaurants. Limit your consumption to less than once per week.  Pastries and sweets: This includes most of the processed junk food and desserts you can think of. Ice cream, cookies, brownies, snack cakes, donuts, candy and more. Try to limit these to no more than four times a week.  Exercise is one of the best medicines for promoting health and maintaining cognitive fitness at all stages in life. Exercise probably has the largest documented effect on brain health and performance of any lifestyle intervention. Studies have shown that even previously sedentary individuals who start exercising as late as age 22 show a significant survival benYour performance and presentation on neuropsychological assessment are overall fairly benign. That is to say, you did not have any findings that were concerning for the types of impairment that I tend to expect in Alzheimer's disease or other common causes of cognitive impairment in individuals your age. You did have some scattered low scores on memory measures and in other areas that may be picking up on the day-to-day complaints that you notice. I cannot entirely rule out that the changes you  notice are due to an underlying condition, but I think it is equally possible that they are due to minor mood disturbance, normal age related changes, and the like. Nevertheless, because your test performance is less than expected and you and your wife notice changes, a diagnosis of mild cognitive impairment is appropriate.   The major difference between mild cognitive impairment (MCI) and dementia is in severity and potential prognosis. Once someone reaches a level of severity adequate to be diagnosed with a dementia, there is usually progression over time, though this may be years. On the other hand, mild cognitive impairment, while a significant risk for dementia in future, does not always progress to dementia, and in some instances stays the same or can even  revert to normal.It is important to realize that if MCI is due to underlying Alzheimer's disease, it will most likely progress to dementia eventually. The rate of conversion to Alzheimer's dementiafrom amnestic MCI is about 15% per year versus the general population risk of conversion of 2% per year.  In your case I am not sure at all that your difficulties are due to Alzheimer's disease and in fact think that they may not be. Nevertheless, it would be prudent to come back for repeat evaluation in 1 to 2 years to make sure you are not worsening.   There is no need to start antidementia medication at this time, because you do not have dementia.   I would recommend that you use behavioral strategies to manage your memory symptoms.   The following compensatory strategies may be helpful for managing day-to-day memory symptoms:  Minimize distractions and interruptions to the extent possible. Be an active observer, present-minded, and focus attention. Focus on only one task for a period of time.   Get organized. Establish routines and stick to them. Make and use checklists.   Use external memory aids as needed, such as a planner and notebook. Repetition, written reminders, and keeping a calendar of appointments may be helpful.  Designate a place to keep your keys, wallet, cell phone, and other personal belongings.   Break down tasks into smaller steps to help get started and to keep from feeling overwhelmed.   Increase your success learning information by breaking it into manageable chunks, connecting it to previously learned information, or forming associations with what you are trying to remember.   There are also lifestyle changes you can make that may reduce your chance of developing dementia in the future, if your difficulties are due to an underlying condition.   There is now good quality evidence from at least one large scale study that a modified mediterranean diet may help slow  cognitive decline. This is known as the "MIND" diet. The Mind diet is not so much a specific diet as it is a set of recommendations for things that you should and should not eat.   Foods that are ENCOURAGED on the MIND Diet:  Green, leafy vegetables: Aim for six or more servings per week. This includes kale, spinach, cooked greens and salads.  All other vegetables: Try to eat another vegetable in addition to the green leafy vegetables at least once a day. It is best to choose non-starchy vegetables because they have a lot of nutrients with a low number of calories.  Berries: Eat berries at least twice a week. There is a plethora of research on strawberries, and other berries such as blueberries, raspberries and blackberries have also been found to have antioxidant and brain health  benefits.  Nuts: Try to get five servings of nuts or more each week. The creators of the [place] don't specify what kind of nuts to consume, but it is probably best to vary the type of nuts you eat to obtain a variety of nutrients. Peanuts are a legume and do not fall into this category.  Olive oil: Use olive oil as your main cooking oil. There may be other heart-healthy alternatives such as algae oil, though there is not yet sufficient research upon which to base a formal recommendation.  Whole grains: Aim for at least three servings daily. Choose minimally processed grains like oatmeal, quinoa, brown rice, whole-wheat pasta and 100% whole-wheat bread.  Fish: Eat fish at least once a week. It is best to choose fatty fish like salmon, sardines, trout, tuna and mackerel for their high amounts of omega-3 fatty acids.  Beans: Include beans in at least four meals every week. This includes all beans, lentils and soybeans.  Poultry: Try to eat chicken or [place] at least twice a week. Note that fried chicken is not encouraged on the MIND diet.  Wine: Aim for no more than one glass of alcohol daily. Both red and white wine may  benefit the brain. However, much research has focused on the red wine compound resveratrol, which may help protect against Alzheimer's disease.  Foods that are DISCOURAGED on the MIND Diet: Butter and margarine: Try to eat less than 1 tablespoon (about 14 grams) daily. Instead, try using olive oil as your primary cooking fat, and dipping your bread in olive oil with herbs.  Cheese: The MIND diet recommends limiting your cheese consumption to less than once per week.  Red meat: Aim for no more than three servings each week. This includes all beef, pork, lamb and products made from these meats.  [place] food: The MIND diet highly discourages fried food, especially the kind from fast-food restaurants. Limit your consumption to less than once per week.  Pastries and sweets: This includes most of the processed junk food and desserts you can think of. Ice cream, cookies, brownies, snack cakes, donuts, candy and more. Try to limit these to no more than four times a week.  Exercise is one of the best medicines for promoting health and maintaining cognitive fitness at all stages in life. Exercise probably has the largest documented effect on brain health and performance of any lifestyle intervention. Studies have shown that even previously sedentary individuals who start exercising as late as age 33 show a significant survival benefit as compared to their non-exercising peers. In the [place], the current guidelines are for 30 minutes of moderate exercise per day, but increasing your activity level less than that may also be helpful. You do not have to get your 30 minutes of exercise in one shot and exercising for short periods of time spread throughout the day can be helpful. Go for several walks, learn to dance, or do something else you enjoy that gets your body moving. Of course, if you have an underlying medical condition or there is any question about whether it is safe for you to exercise, you should  consult a medical treatment provider prior to beginning exercise.   I would like you to know that having a few family members with late onset dementia does not appreciably increase your risk of developing the condition. Over 30% of individuals 80+ will have dementia, and thus if many of your family members lived into old age, some of  them will likely have dementia. There is no pattern concerning for an autosomal dominant condition or other heritable concerning cause for Alzheimer's. efit as compared to their non-exercising peers. In the [place], the current guidelines are for 30 minutes of moderate exercise per day, but increasing your activity level less than that may also be helpful. You do not have to get your 30 minutes of exercise in one shot and exercising for short periods of time spread throughout the day can be helpful. Go for several walks, learn to dance, or do something else you enjoy that gets your body moving. Of course, if you have an underlying medical condition or there is any question about whether it is safe for you to exercise, you should consult a medical treatment provider prior to beginning exercise.   I would like you to know that having a few family members with late onset dementia does not appreciably increase your risk of developing the condition. Over 30% of individuals 80+ will have dementia, and thus if many of your family members lived into old age, some of them will likely have dementia. There is no pattern concerning for an autosomal dominant condition or other heritable concerning cause for Alzheimer's.

## 2020-04-14 ENCOUNTER — Other Ambulatory Visit: Payer: Self-pay | Admitting: Internal Medicine

## 2020-04-14 DIAGNOSIS — E785 Hyperlipidemia, unspecified: Secondary | ICD-10-CM

## 2020-04-16 DIAGNOSIS — I471 Supraventricular tachycardia: Secondary | ICD-10-CM | POA: Insufficient documentation

## 2020-04-16 DIAGNOSIS — Z950 Presence of cardiac pacemaker: Secondary | ICD-10-CM | POA: Insufficient documentation

## 2020-04-19 ENCOUNTER — Telehealth: Payer: Self-pay | Admitting: Internal Medicine

## 2020-04-19 NOTE — Telephone Encounter (Signed)
Spoke with the patient who states that he was supposed to see Dr. Caryl Comes on Friday but had to reschedule to 2/23 because he is going to be out of town. He did want to let Dr.  Caryl Comes know that he has been having some dizzy spells. He states that he does not feel like he is going to pass out. He usually sits down and drinks some water and symptoms go away. He also has noticed that he has been more dehydrated lately and believes this could be a cause of the dizzy spells. I advised him to ensure he is drinking fluids regularly throughout the day and make sure he is changing positions slowly. Patient also states that he was started on Flomax and Prozac a couple of months ago and thinks that dizzy spells have increased since started these medications. I advised him to contact the providers that prescribed these for him as well.

## 2020-04-19 NOTE — Telephone Encounter (Signed)
STAT if patient feels like he/she is going to faint   1) Are you dizzy now? No.  2) Do you feel faint or have you passed out? No.  3) Do you have any other symptoms? No.  4) Have you checked your HR and BP (record if available)? No.  Patient wife states that he's been experiencing dizziness. Please advise.

## 2020-04-20 NOTE — Telephone Encounter (Signed)
C  agree   Whatever alpha blocker they put him on is likely going to make orthostatic dizziness worse \ Thanks SK

## 2020-04-21 ENCOUNTER — Encounter: Payer: Medicare HMO | Admitting: Internal Medicine

## 2020-04-21 DIAGNOSIS — I442 Atrioventricular block, complete: Secondary | ICD-10-CM

## 2020-04-21 DIAGNOSIS — Z950 Presence of cardiac pacemaker: Secondary | ICD-10-CM

## 2020-04-21 DIAGNOSIS — I471 Supraventricular tachycardia: Secondary | ICD-10-CM

## 2020-04-21 DIAGNOSIS — R001 Bradycardia, unspecified: Secondary | ICD-10-CM

## 2020-04-25 ENCOUNTER — Other Ambulatory Visit: Payer: Self-pay | Admitting: Physician Assistant

## 2020-04-26 ENCOUNTER — Ambulatory Visit (INDEPENDENT_AMBULATORY_CARE_PROVIDER_SITE_OTHER): Payer: Medicare HMO

## 2020-04-26 DIAGNOSIS — I472 Ventricular tachycardia, unspecified: Secondary | ICD-10-CM

## 2020-04-27 LAB — CUP PACEART REMOTE DEVICE CHECK
Battery Remaining Longevity: 72 mo
Battery Voltage: 2.96 V
Brady Statistic AP VP Percent: 34.98 %
Brady Statistic AP VS Percent: 0.07 %
Brady Statistic AS VP Percent: 64.04 %
Brady Statistic AS VS Percent: 0.91 %
Brady Statistic RA Percent Paced: 35.59 %
Brady Statistic RV Percent Paced: 99.02 %
Date Time Interrogation Session: 20220208231052
Implantable Lead Implant Date: 20170802
Implantable Lead Implant Date: 20170802
Implantable Lead Implant Date: 20170802
Implantable Lead Location: 753858
Implantable Lead Location: 753859
Implantable Lead Location: 753860
Implantable Lead Model: 5076
Implantable Lead Model: 5076
Implantable Pulse Generator Implant Date: 20170802
Lead Channel Impedance Value: 1045 Ohm
Lead Channel Impedance Value: 1083 Ohm
Lead Channel Impedance Value: 1140 Ohm
Lead Channel Impedance Value: 323 Ohm
Lead Channel Impedance Value: 380 Ohm
Lead Channel Impedance Value: 437 Ohm
Lead Channel Impedance Value: 475 Ohm
Lead Channel Impedance Value: 513 Ohm
Lead Channel Impedance Value: 532 Ohm
Lead Channel Impedance Value: 570 Ohm
Lead Channel Impedance Value: 722 Ohm
Lead Channel Impedance Value: 798 Ohm
Lead Channel Impedance Value: 855 Ohm
Lead Channel Impedance Value: 950 Ohm
Lead Channel Pacing Threshold Amplitude: 0.75 V
Lead Channel Pacing Threshold Amplitude: 1 V
Lead Channel Pacing Threshold Amplitude: 1.125 V
Lead Channel Pacing Threshold Pulse Width: 0.4 ms
Lead Channel Pacing Threshold Pulse Width: 0.4 ms
Lead Channel Pacing Threshold Pulse Width: 0.8 ms
Lead Channel Sensing Intrinsic Amplitude: 1.625 mV
Lead Channel Sensing Intrinsic Amplitude: 1.625 mV
Lead Channel Sensing Intrinsic Amplitude: 7.5 mV
Lead Channel Sensing Intrinsic Amplitude: 7.5 mV
Lead Channel Setting Pacing Amplitude: 1.5 V
Lead Channel Setting Pacing Amplitude: 1.75 V
Lead Channel Setting Pacing Amplitude: 2.5 V
Lead Channel Setting Pacing Pulse Width: 0.4 ms
Lead Channel Setting Pacing Pulse Width: 0.8 ms
Lead Channel Setting Sensing Sensitivity: 2.8 mV

## 2020-05-01 NOTE — Progress Notes (Signed)
Remote pacemaker transmission.   

## 2020-05-07 NOTE — Progress Notes (Unsigned)
Cardiology Office Note Date:  05/07/2020  Patient ID:  George [name], George [name] 03-21-1950, MRN [id number] PCP:  Lorrene Reid, PA-C  Cardiologist:  Dr. Radford Pax Electrophysiologist: Dr. Caryl Comes    Chief Complaint: annual visit  History of Present Illness: George [name] is a 70 y.o. male with history of HTN, , 2nd degree AVblock,II, s/p CRT-P, ATach, NSVT.  I saw him Nov 2019, pre-op prior to dental extraction He feels very good.  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  No CP, palpitations.  No rest SOB, no DOE, no symptoms of PND.  No dizziness, near syncope or syncope.  He will infrequently feel lightheaded upon standing especially when he has been stooped/crouched down doing something.  He has seen a different oral surgeon, and thinks they have decided to do extractions, rather then grafting and other things.  This will be done without sedation, final decision not yet made. Device check noted several NSVT episodes and one 13second VT episode (asymptomatic) months prior. AFter d/w his cardiologist, planned for LHC (noted below)  He last saw Dr. Caryl Comes Dec 2019, HR excursion was poor, rate response turned on.  CM worsening (LVEF 45% at cath) and losartan added to his meds  He saw A. Emmet, [place] Aug 2021, occ lightheadedness when in the heat or poorly hydrated,, no changes were made.  Recent calls with dizziness, recently had been started on Flomax and prozac and his dizziness, was increased with them.  Dr. Caryl Comes suspected the alpha blocker.  TODAY He tells me since he has started to be better about drinking water the dizziness is much better. Particularly was upon standing, never had any near syncope or syncope. No CP, palpitations or cardiac awareness. Denies SOB, DOE   Device information:  MDT CRT-P, implanted 10/18/15, Dr. Caryl Comes   Past Medical History:  Diagnosis Date  . Arthritis THUMBS  . LBBB (left bundle branch block)    bilat. BBB-  PPM  . Peyronie's disease     Past Surgical History:  Procedure Laterality Date  . CARDIOVASCULAR STRESS TEST  10-18-2002  DR  CRENSHAW   NORMAL ADENOSINE CARDIOLITE/ EF 60%  . CATARACT EXTRACTION W/ INTRAOCULAR LENS IMPLANT  2006   LEFT EYE  . EP IMPLANTABLE DEVICE N/A 10/18/2015   Procedure: BiV Pacemaker Insertion CRT-P;  Surgeon: [name], MD;  Location: [place];  Service: Cardiovascular;  Laterality: N/A;  . INGUINAL HERNIA REPAIR Right 02/23/2019   Procedure: RIGHT INGUINAL HERNIA REPAIR WITH MESH;  Surgeon: Erroll Luna, MD;  Location: [facility];  Service: General;  Laterality: Right;  . LAPAROSCOPIC CHOLECYSTECTOMY  10-09-2007  . LEFT HEART CATH AND CORONARY ANGIOGRAPHY N/A 01/30/2018   Procedure: LEFT HEART CATH AND CORONARY ANGIOGRAPHY;  Surgeon: Belva Crome, MD;  Location: [facility];  Service: Cardiovascular;  Laterality: N/A;  . NESBIT PROCEDURE  01/17/2012   Procedure: NESBIT PROCEDURE;  Surgeon: [name], MD;  Location[facility];  Service: Urology;  Laterality: N/A;  16 dot plication   . [place], LEFT EYE  1993  . REPAIR LEFT INGUINAL HERNIA  2005    Current Outpatient Medications  Medication Sig Dispense Refill  . atorvastatin (LIPITOR) 40 MG tablet TAKE 1 TABLET BY MOUTH EVERY DAY AT 6PM 90 tablet 2  . carvedilol (COREG) 25 MG tablet TAKE 1 TABLET BY MOUTH TWICE A DAY WITH MEALS 180 tablet 2  . Cholecalciferol (  VITAMIN D3) 5000 units TABS 5,000 IU OTC vitamin D3 daily. 90 tablet 3  . clotrimazole-betamethasone (LOTRISONE) cream Apply 1 application topically 2 (two) times daily. 45 g 0  . fexofenadine (ALLEGRA) 180 MG tablet Take 1 tablet (180 mg total) by mouth daily. During allergy season 180 tablet 0  . FLUoxetine (PROZAC) 10 MG capsule Take 1 capsule by mouth once daily 90 capsule 0  . fluticasone (FLONASE) 50 MCG/ACT nasal spray USE 1 SPRAY IN EACH NOSTRIL AFTER SINUS RINSES TWICE  DAILY. 48 mL 1  . losartan (COZAAR) 25 MG tablet TAKE 1 TABLET BY MOUTH EVERY DAY 90 tablet 2  . Magnesium 250 MG TABS Take 250 mg by mouth daily.    . Multiple Vitamins-Minerals (CENTRUM ADULTS) TABS Take 1 tablet by mouth daily.    . sildenafil (REVATIO) 20 MG tablet Take 20-100 mg by mouth daily as needed.    . tamsulosin (FLOMAX) 0.4 MG CAPS capsule Take 0.4 mg by mouth daily.    . vitamin B-12 (CYANOCOBALAMIN) 1000 MCG tablet Take 1,000 mcg by mouth daily.    . Zinc 50 MG TABS Take by mouth. (Patient not taking: Reported on 03/08/2020)     No current facility-administered medications for this visit.    Allergies:   Patient has no known allergies.   Social History:  The patient  reports that he has never smoked. He has never used smokeless tobacco. He reports that he does not drink alcohol and does not use drugs.   Family History:  The patient's family history includes Arrhythmia in his father; Diabetes in his mother; Healthy in his daughter and son; Heart attack in his paternal grandfather; Heart disease in his father and paternal grandfather; Hyperlipidemia in his father and mother; Hypertension in his father and mother.  ROS:  Please see the history of present illness.  All other systems are reviewed and otherwise negative.   PHYSICAL EXAM:  VS:  There were no vitals taken for this visit. BMI: There is no height or weight on file to calculate BMI. Well nourished, well developed, in no acute distress  HEENT: normocephalic, atraumatic  Neck: no JVD, carotid bruits or masses Cardiac: RRR; no significant murmurs, no rubs, or gallops Lungs:  CTA b/l, no wheezing, rhonchi or rales  Abd: soft, nontender MS: no deformity or arophy Ext:  no edema  Skin: warm and dry, no rash Neuro:  No gross deficits appreciated Psych: euthymic mood, full affect  PPM site is stable, no tethering or discomfort   EKG:  Not done today  device interrogation done today and reviewed by myself:    Battery and lead measurements are good One NSVT VP at 35bpm 99.1% effective BP    01/30/2018: LHC  Nonobstructive coronary disease involving the RCA, proximal ramus intermedius, and LAD.  Saccular aneurysm involving the proximal to mid segment of the ramus intermedius.  Aneurysm is approximately 5 mm in diameter.  Mildly depressed LV function with global hypokinesis, dyssynergy, and EF estimated at 40%.  Normal LVEDP.  RECOMMENDATIONS:   Aggressive risk factor modification for nonobstructive CAD: LDL less than 70, blood pressure less than 130/80 mmHg, moderate aerobic activity, and monitoring for evidence of hypoglycemia.  Saccular aneurysm and ramus intermedius requires management similar to that for coronary artery disease and in addition would recommend antiplatelet therapy to prevent distal embolization (favor monotherapy with Plavix over aspirin).    01/31/17: TTE Study Conclusions - Left ventricle: The cavity size was normal. Systolic function was  normal. The estimated ejection fraction was in the range of 50%   to 55%. Wall motion was normal; there were no regional wall   motion abnormalities. Doppler parameters are consistent with   abnormal left ventricular relaxation (grade 1 diastolic   dysfunction). - Aortic valve: There was mild regurgitation. - Aorta: Aortic root dimension: 39 mm (ED). - Ascending aorta: The ascending aorta was mildly dilated. - Mitral valve: There was mild regurgitation. - Right ventricle: The cavity size was moderately dilated. Wall   thickness was normal. Pacer wire or catheter noted in right   ventricle. - Right atrium: The atrium was mildly dilated. Impressions: - EF mildly improved from prior study.  10/16/15: TTE Study Conclusions - Left ventricle: The cavity size was normal. Wall thickness was   normal. Systolic function was mildly reduced. The estimated   ejection fraction was in the range of 45% to 50%. Mild diffuse    hypokinesis with no identifiable regional variations. - Ventricular septum: Septal motion showed paradox. - Aortic valve: There was mild regurgitation. - Mitral valve: Systolic bowing without prolapse. There was mild   regurgitation. - Tricuspid valve: Mild prolapse. - Pulmonary arteries: PA peak pressure: 31 mm Hg (S). Impressions: - Second degree AV block and Incessant PVCs are present throughout   the study and limit evaluation of systolic and diastolic   function.    Recent Labs: 10/22/2019: BUN 9; Creatinine, Ser 0.90; Magnesium 2.1; Potassium 3.9; Sodium 139 02/08/2020: TSH 0.96  No results found for requested labs within last 8760 hours.   CrCl cannot be calculated (Patient's most recent lab result is older than the maximum 21 days allowed.).   Wt Readings from Last 3 Encounters:  03/08/20 165 lb (74.8 kg)  02/08/20 163 lb 12.8 oz (74.3 kg)  01/21/20 164 lb 9.6 oz (74.7 kg)     Other studies reviewed: Additional studies/records reviewed today include: summarized above  ASSESSMENT AND PLAN:  1. Heart block s/p PPM     Intact function, no programming changes made  2. Mild CM     99.1% effectiveBP     OptiVol looks good     No symptoms or exam findings to suggest volume OL     On BB, ARB     Will update his echo this year     BMET today  3. Known hx of PVC's, NSVT     Only one NSVT noted on device today                       Disposition: remotes as usual, back in clinic in 1 year, sooner if needed    Current medicines are reviewed at length with the patient today.  The patient did not have any concerns regarding medicines.  Haywood Lasso, PA-C 05/07/2020 3:45 PM     [address] [place] [address] [phone] (office)  [phone] (fax)

## 2020-05-10 ENCOUNTER — Ambulatory Visit: Payer: Medicare HMO | Admitting: Physician Assistant

## 2020-05-10 ENCOUNTER — Encounter: Payer: Self-pay | Admitting: Physician Assistant

## 2020-05-10 ENCOUNTER — Other Ambulatory Visit: Payer: Self-pay

## 2020-05-10 VITALS — BP 134/86 | HR 75 | Ht 68.5 in | Wt 161.6 lb

## 2020-05-10 DIAGNOSIS — Z950 Presence of cardiac pacemaker: Secondary | ICD-10-CM | POA: Diagnosis not present

## 2020-05-10 DIAGNOSIS — I472 Ventricular tachycardia: Secondary | ICD-10-CM

## 2020-05-10 DIAGNOSIS — I429 Cardiomyopathy, unspecified: Secondary | ICD-10-CM

## 2020-05-10 DIAGNOSIS — Z79899 Other long term (current) drug therapy: Secondary | ICD-10-CM

## 2020-05-10 DIAGNOSIS — I4729 Other ventricular tachycardia: Secondary | ICD-10-CM

## 2020-05-10 DIAGNOSIS — I471 Supraventricular tachycardia: Secondary | ICD-10-CM | POA: Diagnosis not present

## 2020-05-10 LAB — CUP PACEART INCLINIC DEVICE CHECK
Battery Remaining Longevity: 68 mo
Battery Voltage: 2.96 V
Brady Statistic AP VP Percent: 36.82 %
Brady Statistic AP VS Percent: 0.05 %
Brady Statistic AS VP Percent: 62.47 %
Brady Statistic AS VS Percent: 0.66 %
Brady Statistic RA Percent Paced: 37.19 %
Brady Statistic RV Percent Paced: 99.28 %
Date Time Interrogation Session: 20220223162008
Implantable Lead Implant Date: 20170802
Implantable Lead Implant Date: 20170802
Implantable Lead Implant Date: 20170802
Implantable Lead Location: 753858
Implantable Lead Location: 753859
Implantable Lead Location: 753860
Implantable Lead Model: 5076
Implantable Lead Model: 5076
Implantable Pulse Generator Implant Date: 20170802
Lead Channel Impedance Value: 1026 Ohm
Lead Channel Impedance Value: 1083 Ohm
Lead Channel Impedance Value: 1140 Ohm
Lead Channel Impedance Value: 342 Ohm
Lead Channel Impedance Value: 380 Ohm
Lead Channel Impedance Value: 437 Ohm
Lead Channel Impedance Value: 456 Ohm
Lead Channel Impedance Value: 513 Ohm
Lead Channel Impedance Value: 532 Ohm
Lead Channel Impedance Value: 570 Ohm
Lead Channel Impedance Value: 703 Ohm
Lead Channel Impedance Value: 779 Ohm
Lead Channel Impedance Value: 836 Ohm
Lead Channel Impedance Value: 969 Ohm
Lead Channel Pacing Threshold Amplitude: 0.75 V
Lead Channel Pacing Threshold Amplitude: 1 V
Lead Channel Pacing Threshold Amplitude: 1.375 V
Lead Channel Pacing Threshold Pulse Width: 0.4 ms
Lead Channel Pacing Threshold Pulse Width: 0.4 ms
Lead Channel Pacing Threshold Pulse Width: 0.8 ms
Lead Channel Sensing Intrinsic Amplitude: 1.875 mV
Lead Channel Sensing Intrinsic Amplitude: 2 mV
Lead Channel Sensing Intrinsic Amplitude: 7.5 mV
Lead Channel Sensing Intrinsic Amplitude: 7.5 mV
Lead Channel Setting Pacing Amplitude: 1.5 V
Lead Channel Setting Pacing Amplitude: 2 V
Lead Channel Setting Pacing Amplitude: 2.5 V
Lead Channel Setting Pacing Pulse Width: 0.4 ms
Lead Channel Setting Pacing Pulse Width: 0.8 ms
Lead Channel Setting Sensing Sensitivity: 2.8 mV

## 2020-05-10 NOTE — Patient Instructions (Signed)
Medication Instructions:   Your physician recommends that you continue on your current medications as directed. Please refer to the Current Medication list given to you today.  *If you need a refill on your cardiac medications before your next appointment, please call your pharmacy*   Lab Work: BMET TODAY   If you have labs (blood work) drawn today and your tests are completely normal, you will receive your results only by: Marland Kitchen MyChart Message (if you have MyChart) OR . A paper copy in the mail If you have any lab test that is abnormal or we need to change your treatment, we will call you to review the results.   Testing/Procedures: Your physician has requested that you have an echocardiogram. Echocardiography is a painless test that uses sound waves to create images of your heart. It provides your doctor with information about the size and shape of your heart and how well your heart's chambers and valves are working. This procedure takes approximately one hour. There are no restrictions for this procedure.   Follow-Up: At [facility], you and your health needs are our priority.  As part of our continuing mission to provide you with exceptional heart care, we have created designated Provider Care Teams.  These Care Teams include your primary Cardiologist (physician) and Advanced Practice Providers (APPs -  Physician Assistants and Nurse Practitioners) who all work together to provide you with the care you need, when you need it.  We recommend signing up for the patient portal called "MyChart".  Sign up information is provided on this After Visit Summary.  MyChart is used to connect with patients for Virtual Visits (Telemedicine).  Patients are able to view lab/test results, encounter notes, upcoming appointments, etc.  Non-urgent messages can be sent to your provider as well.   To learn more about what you can do with MyChart, go to [email or web address].    Your next appointment:   1  year(s)  The format for your next appointment:   In Person  Provider:   You may see Virl Axe, MD or one of the following Advanced Practice Providers on your designated Care Team:    Chanetta Marshall, NP  Tommye Standard, [place]  Legrand Como "Oda Kilts, [place]    Other Instructions

## 2020-05-11 LAB — BASIC METABOLIC PANEL
BUN/Creatinine Ratio: 9 — ABNORMAL LOW (ref 10–24)
BUN: 8 mg/dL (ref 8–27)
CO2: 24 mmol/L (ref 20–29)
Calcium: 9.8 mg/dL (ref 8.6–10.2)
Chloride: 100 mmol/L (ref 96–106)
Creatinine, Ser: 0.87 mg/dL (ref 0.76–1.27)
GFR calc Af Amer: 103 mL/min/{1.73_m2} (ref >59)
GFR calc non Af Amer: 89 mL/min/{1.73_m2} (ref >59)
Glucose: 96 mg/dL (ref 65–99)
Potassium: 4.4 mmol/L (ref 3.5–5.2)
Sodium: 137 mmol/L (ref 134–144)

## 2020-06-05 ENCOUNTER — Ambulatory Visit (HOSPITAL_COMMUNITY): Payer: Medicare HMO | Attending: Cardiovascular Disease

## 2020-06-05 ENCOUNTER — Other Ambulatory Visit: Payer: Self-pay

## 2020-06-05 DIAGNOSIS — I2510 Atherosclerotic heart disease of native coronary artery without angina pectoris: Secondary | ICD-10-CM | POA: Insufficient documentation

## 2020-06-05 DIAGNOSIS — R0609 Other forms of dyspnea: Secondary | ICD-10-CM | POA: Diagnosis not present

## 2020-06-05 DIAGNOSIS — Z950 Presence of cardiac pacemaker: Secondary | ICD-10-CM | POA: Insufficient documentation

## 2020-06-05 DIAGNOSIS — R001 Bradycardia, unspecified: Secondary | ICD-10-CM | POA: Insufficient documentation

## 2020-06-05 DIAGNOSIS — I340 Nonrheumatic mitral (valve) insufficiency: Secondary | ICD-10-CM | POA: Diagnosis not present

## 2020-06-05 DIAGNOSIS — I517 Cardiomegaly: Secondary | ICD-10-CM | POA: Diagnosis not present

## 2020-06-05 DIAGNOSIS — I351 Nonrheumatic aortic (valve) insufficiency: Secondary | ICD-10-CM | POA: Insufficient documentation

## 2020-06-05 DIAGNOSIS — I429 Cardiomyopathy, unspecified: Secondary | ICD-10-CM | POA: Diagnosis not present

## 2020-06-05 DIAGNOSIS — I442 Atrioventricular block, complete: Secondary | ICD-10-CM | POA: Insufficient documentation

## 2020-06-05 DIAGNOSIS — E785 Hyperlipidemia, unspecified: Secondary | ICD-10-CM | POA: Diagnosis not present

## 2020-06-05 DIAGNOSIS — I10 Essential (primary) hypertension: Secondary | ICD-10-CM | POA: Insufficient documentation

## 2020-06-05 LAB — ECHOCARDIOGRAM COMPLETE
Area-P 1/2: 3.34 cm2
MV M vel: 5.18 m/s
MV Peak grad: 107.3 mmHg
P 1/2 time: 802 msec
S' Lateral: 3.1 cm

## 2020-07-20 ENCOUNTER — Telehealth: Payer: Self-pay

## 2020-07-20 NOTE — Telephone Encounter (Signed)
Called and spoke with patient, he is aware of appt 07/21/20 at 10:30 am with Adline Peals, PA-C.

## 2020-07-20 NOTE — Telephone Encounter (Signed)
New persistent AF, good ventricular rate control, AF burden is 0.9% of the time.  ? George [name],   Spoke with pt.  He reports he felt very fatigued and overall off yesterday.  Today he feel better.    Patient sent manual transmission, presenting rhythm shows he is still in AF today.  V rates overall controlled.    Patient agreeable to AF clinic consult.  Requests if possible to be seen tomorrow 07/21/20.

## 2020-07-21 ENCOUNTER — Encounter (HOSPITAL_COMMUNITY): Payer: Self-pay | Admitting: Physician Assistant

## 2020-07-21 ENCOUNTER — Ambulatory Visit (HOSPITAL_COMMUNITY)
Admission: RE | Admit: 2020-07-21 | Discharge: 2020-07-21 | Disposition: A | Discharge status: Home / Self Care | Payer: Medicare HMO | Source: Ambulatory Visit | Attending: Physician Assistant | Admitting: Physician Assistant

## 2020-07-21 ENCOUNTER — Other Ambulatory Visit: Payer: Self-pay

## 2020-07-21 VITALS — BP 132/88 | HR 74 | Ht 68.5 in | Wt 161.2 lb

## 2020-07-21 DIAGNOSIS — Z8249 Family history of ischemic heart disease and other diseases of the circulatory system: Secondary | ICD-10-CM | POA: Insufficient documentation

## 2020-07-21 DIAGNOSIS — D6869 Other thrombophilia: Secondary | ICD-10-CM | POA: Insufficient documentation

## 2020-07-21 DIAGNOSIS — I472 Ventricular tachycardia: Secondary | ICD-10-CM | POA: Insufficient documentation

## 2020-07-21 DIAGNOSIS — I441 Atrioventricular block, second degree: Secondary | ICD-10-CM | POA: Insufficient documentation

## 2020-07-21 DIAGNOSIS — I2510 Atherosclerotic heart disease of native coronary artery without angina pectoris: Secondary | ICD-10-CM | POA: Diagnosis not present

## 2020-07-21 DIAGNOSIS — I10 Essential (primary) hypertension: Secondary | ICD-10-CM | POA: Diagnosis not present

## 2020-07-21 DIAGNOSIS — I4819 Other persistent atrial fibrillation: Secondary | ICD-10-CM | POA: Diagnosis not present

## 2020-07-21 DIAGNOSIS — Z950 Presence of cardiac pacemaker: Secondary | ICD-10-CM | POA: Diagnosis not present

## 2020-07-21 DIAGNOSIS — Z79899 Other long term (current) drug therapy: Secondary | ICD-10-CM | POA: Diagnosis not present

## 2020-07-21 DIAGNOSIS — Z7901 Long term (current) use of anticoagulants: Secondary | ICD-10-CM | POA: Diagnosis not present

## 2020-07-21 DIAGNOSIS — I4891 Unspecified atrial fibrillation: Secondary | ICD-10-CM | POA: Diagnosis not present

## 2020-07-21 MED ORDER — ELIQUIS 5 MG PO TABS: 5.0000 mg | ORAL_TABLET | Freq: Two times a day (BID) | ORAL | 3 refills | Status: DC

## 2020-07-21 NOTE — Patient Instructions (Signed)
Cardioversion scheduled for Friday, June 3rd  - Arrive at the [facility] and go to admitting at   [facility] not eat or drink anything after midnight the night prior to your procedure.  - Take all your morning medication (except diabetic medications) with a sip of water prior to arrival.  - You will not be able to drive home after your procedure.  - Do NOT miss any doses of your blood thinner - if you should miss a dose please notify our office immediately.  - If you feel as if you go back into normal rhythm prior to scheduled cardioversion, please notify our office immediately. If your procedure is canceled in the cardioversion suite you will be charged a cancellation fee.    START Eliquis 5mg  twice a day

## 2020-07-21 NOTE — Progress Notes (Signed)
Primary Care Physician: Lorrene Reid, PA-C Primary Cardiologist: Dr Radford Pax Primary Electrophysiologist: Dr Caryl Comes Referring Physician: Device clinic/Dr Lillia Dallas George [name] is a 70 y.o. male with a history of HTN, CAD, 2nd degree AV block II s/p CRT-P, ATach, NSVT, atrial fibrillation who presents for consultation in the [facility]. The patient was initially diagnosed with atrial fibrillation 07/20/20 after the device clinic received an alert for an ongoing episode. Patient has a CHADS2VASC score of 3. He denies palpitations but has noted more fatigue over the last few days. There were no specific triggers that he could identify.   Today, he denies symptoms of palpitations, chest pain, shortness of breath, orthopnea, PND, lower extremity edema, dizziness, presyncope, syncope, snoring, daytime somnolence, bleeding, or neurologic sequela. The patient is tolerating medications without difficulties and is otherwise without complaint today.    Atrial Fibrillation Risk Factors:  he does not have symptoms or diagnosis of sleep apnea. he does not have a history of rheumatic fever. he does not have a history of alcohol use. The patient does have a history of early familial atrial fibrillation or other arrhythmias. Father has afib.  he has a BMI of Body mass index is 24.15 kg/m.Marland Kitchen Filed Weights   07/21/20 1000  Weight: 73.1 kg    Family History  Problem Relation Age of Onset  . Diabetes Mother   . Hypertension Mother   . Hyperlipidemia Mother   . Heart disease Father   . Hyperlipidemia Father   . Hypertension Father   . Arrhythmia Father        paf  . Healthy Daughter   . Healthy Son   . Heart disease Paternal Grandfather   . Heart attack Paternal Grandfather      Atrial Fibrillation Management history:  Previous antiarrhythmic drugs: none Previous cardioversions: none Previous ablations: none CHADS2VASC score: 3 Anticoagulation history:  none   Past Medical History:  Diagnosis Date  . Arthritis THUMBS  . LBBB (left bundle branch block)    bilat. BBB- PPM  . Peyronie's disease    Past Surgical History:  Procedure Laterality Date  . CARDIOVASCULAR STRESS TEST  10-18-2002  DR  CRENSHAW   NORMAL ADENOSINE CARDIOLITE/ EF 60%  . CATARACT EXTRACTION W/ INTRAOCULAR LENS IMPLANT  2006   LEFT EYE  . EP IMPLANTABLE DEVICE N/A 10/18/2015   Procedure: BiV Pacemaker Insertion CRT-P;  Surgeon: [name], MD;  Location: [place] [facility];  Service: Cardiovascular;  Laterality: N/A;  . INGUINAL HERNIA REPAIR Right 02/23/2019   Procedure: RIGHT INGUINAL HERNIA REPAIR WITH MESH;  Surgeon: Erroll Luna, MD;  Location: [place];  Service: General;  Laterality: Right;  . LAPAROSCOPIC CHOLECYSTECTOMY  10-09-2007  . LEFT HEART CATH AND CORONARY ANGIOGRAPHY N/A 01/30/2018   Procedure: LEFT HEART CATH AND CORONARY ANGIOGRAPHY;  Surgeon: Belva Crome, MD;  Location: [place];  Service: Cardiovascular;  Laterality: N/A;  . NESBIT PROCEDURE  01/17/2012   Procedure: NESBIT PROCEDURE;  Surgeon: [name], MD;  Location: [facility];  Service: Urology;  Laterality: N/A;  16 dot plication   . [place], LEFT EYE  1993  . REPAIR LEFT INGUINAL HERNIA  2005    Current Outpatient Medications  Medication Sig Dispense Refill  . apixaban (ELIQUIS) 5 MG TABS tablet Take 1 tablet (5 mg total) by mouth 2 (two) times daily. 60 tablet 3  . atorvastatin (LIPITOR) 40 MG tablet TAKE  1 TABLET BY MOUTH EVERY DAY AT 6PM 90 tablet 2  . carvedilol (COREG) 25 MG tablet TAKE 1 TABLET BY MOUTH TWICE A DAY WITH MEALS 180 tablet 2  . Cholecalciferol (VITAMIN D3) 5000 units TABS 5,000 IU OTC vitamin D3 daily. 90 tablet 3  . clotrimazole-betamethasone (LOTRISONE) cream Apply 1 application topically 2 (two) times daily. 45 g 0  . fexofenadine (ALLEGRA) 180 MG tablet Take 1 tablet (180 mg total) by mouth  daily. During allergy season 180 tablet 0  . FLUoxetine (PROZAC) 10 MG capsule Take 1 capsule by mouth once daily 90 capsule 0  . fluticasone (FLONASE) 50 MCG/ACT nasal spray USE 1 SPRAY IN EACH NOSTRIL AFTER SINUS RINSES TWICE DAILY. 48 mL 1  . losartan (COZAAR) 25 MG tablet TAKE 1 TABLET BY MOUTH EVERY DAY 90 tablet 2  . Magnesium 250 MG TABS Take 250 mg by mouth daily.    . Multiple Vitamins-Minerals (CENTRUM ADULTS) TABS Take 1 tablet by mouth daily.    . sildenafil (REVATIO) 20 MG tablet Take 20-100 mg by mouth daily as needed.    . tamsulosin (FLOMAX) 0.4 MG CAPS capsule Take 0.4 mg by mouth daily.    . vitamin B-12 (CYANOCOBALAMIN) 1000 MCG tablet Take 1,000 mcg by mouth daily.    . Zinc 50 MG TABS Take by mouth.     No current facility-administered medications for this encounter.    No Known Allergies  Social History   Socioeconomic History  . Marital status: Married    Spouse name: Not on file  . Number of children: Not on file  . Years of education: Not on file  . Highest education level: Not on file  Occupational History  . Not on file  Tobacco Use  . Smoking status: Never Smoker  . Smokeless tobacco: Never Used  Vaping Use  . Vaping Use: Never used  Substance and Sexual Activity  . Alcohol use: No  . Drug use: No  . Sexual activity: Yes  Other Topics Concern  . Not on file  Social History Narrative   Right handed    Lives with wife   Social Determinants of Health   Financial Resource Strain: Not on file  Food Insecurity: Not on file  Transportation Needs: Not on file  Physical Activity: Not on file  Stress: Not on file  Social Connections: Not on file  Intimate Partner Violence: Not on file     ROS- All systems are reviewed and negative except as per the HPI above.  Physical Exam: Vitals:   07/21/20 1000  BP: 132/88  Pulse: 74  Weight: 73.1 kg  Height: 5' 8.5" (1.74 m)    GEN- The patient is a well appearing male, alert and oriented x 3  today.  Head- normocephalic, atraumatic Eyes-  Sclera clear, conjunctiva pink Ears- hearing intact Oropharynx- clear Neck- supple  Lungs- Clear to ausculation bilaterally, normal work of breathing Heart- Regular rate and rhythm, no murmurs, rubs or gallops  GI- soft, NT, ND, + BS Extremities- no clubbing, cyanosis, or edema MS- no significant deformity or atrophy Skin- no rash or lesion Psych- euthymic mood, full affect Neuro- strength and sensation are intact  Wt Readings from Last 3 Encounters:  07/21/20 73.1 kg  05/10/20 73.3 kg  03/08/20 74.8 kg    EKG today demonstrates  V paced rhythm with underlying afib  Vent. rate 74 BPM PR interval * ms QRS duration 142 ms QT/QTcB 426/472 ms  Echo 06/05/20 demonstrated  1. Inferior wall hypokinesis . Left ventricular ejection fraction, by  estimation, is 50 to 55%. The left ventricle has low normal function. The  left ventricle has no regional wall motion abnormalities. The left  ventricular internal cavity size was  mildly dilated. There is mild asymmetric left ventricular hypertrophy of  the basal and septal segments. Left ventricular diastolic parameters are  indeterminate.  2. Pacing wire in RA/RV . Right ventricular systolic function is normal.  The right ventricular size is normal.  3. Left atrial size was mildly dilated.  4. Right atrial size was mildly dilated.  5. The mitral valve is abnormal. Mild mitral valve regurgitation. No  evidence of mitral stenosis.  6. The aortic valve is abnormal. Aortic valve regurgitation is mild. No  aortic stenosis is present.  7. The inferior vena cava is normal in size with greater than 50%  respiratory variability, suggesting right atrial pressure of 3 mmHg.   Epic records are reviewed at length today  CHA2DS2-VASc Score = 3  The patient's score is based upon: CHF History: No HTN History: Yes Diabetes History: No Stroke History: No Vascular Disease History: Yes Age  Score: 1 Gender Score: 0      ASSESSMENT AND PLAN: 1. New onset atrial fibrillation  The patient's CHA2DS2-VASc score is 3, indicating a 3.2% annual risk of stroke.   General education about afib provided and questions answered. We also discussed his stroke risk and the risks and benefits of anticoagulation. Will start Eliquis 5 mg BID. Will plan on DCCV after at least 3 weeks of uninterrupted anticoagulation.  Continue Coreg 25 mg BID  2. Secondary Hypercoagulable State (ICD10:  D68.69) The patient is at significant risk for stroke/thromboembolism based upon his CHA2DS2-VASc Score of 3.  Start Apixaban (Eliquis).   3. 2nd degree AV block type II S/p PPM, followed by Dr Caryl Comes and the device clinic.  4. CAD Nonobstructive on LHC 2019 No anginal symptoms.   Follow up in the AF clinic in 3 weeks.    [facility] [address] [place], [place] [address] [phone] 07/21/2020 10:59 AM

## 2020-07-26 ENCOUNTER — Ambulatory Visit (INDEPENDENT_AMBULATORY_CARE_PROVIDER_SITE_OTHER): Payer: Medicare HMO

## 2020-07-26 DIAGNOSIS — I429 Cardiomyopathy, unspecified: Secondary | ICD-10-CM

## 2020-07-26 LAB — CUP PACEART REMOTE DEVICE CHECK
Battery Remaining Longevity: 59 mo
Battery Voltage: 2.95 V
Brady Statistic RA Percent Paced: 3.14 %
Brady Statistic RV Percent Paced: 99.14 %
Date Time Interrogation Session: 20220511004259
Implantable Lead Implant Date: 20170802
Implantable Lead Implant Date: 20170802
Implantable Lead Implant Date: 20170802
Implantable Lead Location: 753858
Implantable Lead Location: 753859
Implantable Lead Location: 753860
Implantable Lead Model: 5076
Implantable Lead Model: 5076
Implantable Pulse Generator Implant Date: 20170802
Lead Channel Impedance Value: 1026 Ohm
Lead Channel Impedance Value: 1064 Ohm
Lead Channel Impedance Value: 304 Ohm
Lead Channel Impedance Value: 342 Ohm
Lead Channel Impedance Value: 418 Ohm
Lead Channel Impedance Value: 418 Ohm
Lead Channel Impedance Value: 494 Ohm
Lead Channel Impedance Value: 494 Ohm
Lead Channel Impedance Value: 494 Ohm
Lead Channel Impedance Value: 665 Ohm
Lead Channel Impedance Value: 760 Ohm
Lead Channel Impedance Value: 779 Ohm
Lead Channel Impedance Value: 874 Ohm
Lead Channel Impedance Value: 988 Ohm
Lead Channel Pacing Threshold Amplitude: 0.75 V
Lead Channel Pacing Threshold Amplitude: 1.125 V
Lead Channel Pacing Threshold Amplitude: 1.25 V
Lead Channel Pacing Threshold Pulse Width: 0.4 ms
Lead Channel Pacing Threshold Pulse Width: 0.4 ms
Lead Channel Pacing Threshold Pulse Width: 0.8 ms
Lead Channel Sensing Intrinsic Amplitude: 0.375 mV
Lead Channel Sensing Intrinsic Amplitude: 0.375 mV
Lead Channel Sensing Intrinsic Amplitude: 7.5 mV
Lead Channel Sensing Intrinsic Amplitude: 7.5 mV
Lead Channel Setting Pacing Amplitude: 1.5 V
Lead Channel Setting Pacing Amplitude: 1.75 V
Lead Channel Setting Pacing Amplitude: 2.5 V
Lead Channel Setting Pacing Pulse Width: 0.4 ms
Lead Channel Setting Pacing Pulse Width: 0.8 ms
Lead Channel Setting Sensing Sensitivity: 2.8 mV

## 2020-08-01 ENCOUNTER — Ambulatory Visit (INDEPENDENT_AMBULATORY_CARE_PROVIDER_SITE_OTHER): Payer: Medicare HMO | Admitting: Nurse Practitioner

## 2020-08-01 ENCOUNTER — Encounter: Payer: Self-pay | Admitting: Nurse Practitioner

## 2020-08-01 ENCOUNTER — Other Ambulatory Visit: Payer: Self-pay

## 2020-08-01 VITALS — BP 98/59 | HR 67 | Temp 97.9°F | Ht 69.0 in | Wt 159.6 lb

## 2020-08-01 DIAGNOSIS — H6123 Impacted cerumen, bilateral: Secondary | ICD-10-CM | POA: Insufficient documentation

## 2020-08-01 DIAGNOSIS — J302 Other seasonal allergic rhinitis: Secondary | ICD-10-CM

## 2020-08-01 MED ORDER — DEBROX 6.5 % OT SOLN: 5.0000 [drp] | Freq: Two times a day (BID) | OTIC | 1 refills | Status: DC

## 2020-08-01 NOTE — Patient Instructions (Addendum)

## 2020-08-01 NOTE — Progress Notes (Signed)
Established Patient Office Visit  Subjective:  Patient ID: George [name], male    DOB: 1951/02/09  Age: 70 y.o. MRN: [id number]  CC:  Chief Complaint  Patient presents with  . right ear    HPI George [name] presents for evaluation of right ear feeling "stopped up." states that he is having trouble hearing in the right ear. He denies ear pain. States that he does have some nasal congestion, but otherwise, he feels ok. Denies headaches, sore throat, cough, or wheezing. States that he feels like he has ear wax built up in his right ear. Has had this happen in the past. Has had to have ears flushed. Usually does good with procedure.  He denies chest pain, chest pressure, or shortness of breath. He denies headaches or visual disturbances. He denies abdominal pain, nausea, vomiting, or changes in bowel or bladder habits.    Past Medical History:  Diagnosis Date  . Arthritis THUMBS  . LBBB (left bundle branch block)    bilat. BBB- PPM  . Peyronie's disease     Past Surgical History:  Procedure Laterality Date  . CARDIOVASCULAR STRESS TEST  10-18-2002  DR  CRENSHAW   NORMAL ADENOSINE CARDIOLITE/ EF 60%  . CATARACT EXTRACTION W/ INTRAOCULAR LENS IMPLANT  2006   LEFT EYE  . EP IMPLANTABLE DEVICE N/A 10/18/2015   Procedure: BiV Pacemaker Insertion CRT-P;  Surgeon: [name], MD;  Location: [facility];  Service: Cardiovascular;  Laterality: N/A;  . INGUINAL HERNIA REPAIR Right 02/23/2019   Procedure: RIGHT INGUINAL HERNIA REPAIR WITH MESH;  Surgeon: Erroll Luna, MD;  Location: [facility];  Service: General;  Laterality: Right;  . LAPAROSCOPIC CHOLECYSTECTOMY  10-09-2007  . LEFT HEART CATH AND CORONARY ANGIOGRAPHY N/A 01/30/2018   Procedure: LEFT HEART CATH AND CORONARY ANGIOGRAPHY;  Surgeon: Belva Crome, MD;  Location: [facility];  Service: Cardiovascular;  Laterality: N/A;  . NESBIT PROCEDURE  01/17/2012   Procedure: NESBIT PROCEDURE;  Surgeon:  [name], MD;  Location: [facility];  Service: Urology;  Laterality: N/A;  16 dot plication   . [place], LEFT EYE  1993  . REPAIR LEFT INGUINAL HERNIA  2005    Family History  Problem Relation Age of Onset  . Diabetes Mother   . Hypertension Mother   . Hyperlipidemia Mother   . Heart disease Father   . Hyperlipidemia Father   . Hypertension Father   . Arrhythmia Father        paf  . Healthy Daughter   . Healthy Son   . Heart disease Paternal Grandfather   . Heart attack Paternal Grandfather     Social History   Socioeconomic History  . Marital status: Married    Spouse name: Not on file  . Number of children: Not on file  . Years of education: Not on file  . Highest education level: Not on file  Occupational History  . Not on file  Tobacco Use  . Smoking status: Never Smoker  . Smokeless tobacco: Never Used  Vaping Use  . Vaping Use: Never used  Substance and Sexual Activity  . Alcohol use: No  . Drug use: No  . Sexual activity: Yes  Other Topics Concern  . Not on file  Social History Narrative   Right handed    Lives with wife   Social Determinants of Health   Financial Resource Strain: Not on file  Food  Insecurity: Not on file  Transportation Needs: Not on file  Physical Activity: Not on file  Stress: Not on file  Social Connections: Not on file  Intimate Partner Violence: Not on file    Outpatient Medications Prior to Visit  Medication Sig Dispense Refill  . apixaban (ELIQUIS) 5 MG TABS tablet Take 1 tablet (5 mg total) by mouth 2 (two) times daily. 60 tablet 3  . atorvastatin (LIPITOR) 40 MG tablet TAKE 1 TABLET BY MOUTH EVERY DAY AT 6PM (Patient taking differently: Take 40 mg by mouth daily.) 90 tablet 2  . carvedilol (COREG) 25 MG tablet TAKE 1 TABLET BY MOUTH TWICE A DAY WITH MEALS (Patient taking differently: Take 25 mg by mouth 2 (two) times daily with a meal.) 180 tablet 2  . Cholecalciferol (VITAMIN D3) 5000  units TABS 5,000 IU OTC vitamin D3 daily. (Patient taking differently: Take 5,000 Units by mouth daily.) 90 tablet 3  . clotrimazole-betamethasone (LOTRISONE) cream Apply 1 application topically 2 (two) times daily. (Patient taking differently: Apply 1 application topically 2 (two) times daily as needed (rash).) 45 g 0  . fexofenadine (ALLEGRA) 180 MG tablet Take 1 tablet (180 mg total) by mouth daily. During allergy season (Patient not taking: No sig reported) 180 tablet 0  . fluticasone (FLONASE) 50 MCG/ACT nasal spray USE 1 SPRAY IN EACH NOSTRIL AFTER SINUS RINSES TWICE DAILY. (Patient taking differently: Place 2 sprays into both nostrils 2 (two) times daily as needed for allergies.) 48 mL 1  . losartan (COZAAR) 25 MG tablet TAKE 1 TABLET BY MOUTH EVERY DAY (Patient taking differently: Take 25 mg by mouth daily.) 90 tablet 2  . Magnesium 250 MG TABS Take 250 mg by mouth daily.    . Multiple Vitamins-Minerals (CENTRUM ADULTS) TABS Take 1 tablet by mouth daily.    . sildenafil (REVATIO) 20 MG tablet Take 20-100 mg by mouth daily as needed.    . tamsulosin (FLOMAX) 0.4 MG CAPS capsule Take 0.4 mg by mouth daily.    . vitamin B-12 (CYANOCOBALAMIN) 1000 MCG tablet Take 1,000 mcg by mouth daily.    . Zinc 50 MG TABS Take 50 mg by mouth every other day.    Marland Kitchen FLUoxetine (PROZAC) 10 MG capsule Take 1 capsule by mouth once daily 90 capsule 0   No facility-administered medications prior to visit.    No Known Allergies  ROS Review of Systems  Constitutional: Negative for activity change, chills and fever.  HENT: Positive for hearing loss. Negative for congestion, postnasal drip, rhinorrhea, sinus pressure, sinus pain and sneezing.        Right ear feels "stopped up."  Eyes: Negative.   Respiratory: Negative for cough, chest tightness, shortness of breath and wheezing.   Cardiovascular: Negative for chest pain and palpitations.  Gastrointestinal: Negative for constipation.  Endocrine: Negative.    Musculoskeletal: Negative for back pain and myalgias.  Skin: Negative for rash.  Allergic/Immunologic: Positive for environmental allergies.  Neurological: Negative for dizziness, weakness and headaches.  Hematological: Negative.   Psychiatric/Behavioral: Negative for dysphoric mood. The patient is not nervous/anxious.   All other systems reviewed and are negative.     Objective:    Physical Exam Vitals and nursing note reviewed.  Constitutional:      Appearance: Normal appearance. He is well-developed.  HENT:     Head: Normocephalic and atraumatic.     Comments: Bilateral ear lavage performed t today's visit. Some ear wax was removed, but complete clearance was not achieved. No  redness or irritation noted after procedure. Patient tolerated ear lavage well.     Right Ear: There is impacted cerumen. Tympanic membrane is not erythematous or bulging.     Left Ear: There is impacted cerumen. Tympanic membrane is not erythematous or bulging.     Nose: Nose normal.     Right Turbinates: Not swollen.     Left Turbinates: Not swollen.     Right Sinus: No maxillary sinus tenderness or frontal sinus tenderness.     Left Sinus: No maxillary sinus tenderness or frontal sinus tenderness.  Eyes:     Pupils: Pupils are equal, round, and reactive to light.  Cardiovascular:     Rate and Rhythm: Normal rate. Rhythm irregular.     Pulses: Normal pulses.     Heart sounds: Normal heart sounds.  Pulmonary:     Effort: Pulmonary effort is normal.     Breath sounds: Normal breath sounds.  Abdominal:     Palpations: Abdomen is soft.  Musculoskeletal:        General: Normal range of motion.     Cervical back: Normal range of motion and neck supple.  Skin:    General: Skin is warm and dry.     Capillary Refill: Capillary refill takes less than 2 seconds.  Neurological:     Mental Status: He is alert and oriented to person, place, and time. Mental status is at baseline.  Psychiatric:        Mood  and Affect: Mood normal.        Behavior: Behavior normal.        Thought Content: Thought content normal.        Judgment: Judgment normal.     Today's Vitals   08/01/20 0928  BP: (!) 98/59  Pulse: 67  Temp: 97.9 F (36.6 C)  SpO2: 98%  Weight: 159 lb 9.6 oz (72.4 kg)  Height: 5\' 9"  (1.753 m)   Body mass index is 23.57 kg/m.   Wt Readings from Last 3 Encounters:  08/09/20 159 lb 6.4 oz (72.3 kg)  08/01/20 159 lb 9.6 oz (72.4 kg)  07/21/20 161 lb 3.2 oz (73.1 kg)     Health Maintenance Due  Topic Date Due  . Zoster Vaccines- Shingrix (1 of 2) Never done  . PNA vac Low Risk Adult (1 of 2 - PCV13) Never done  . COVID-19 Vaccine (4 - Booster for Pfizer series) 03/29/2020    There are no preventive care reminders to display for this patient.  Lab Results  Component Value Date   TSH 0.96 02/08/2020   Lab Results  Component Value Date   WBC 5.5 08/09/2020   HGB 13.6 08/09/2020   HCT 41.5 08/09/2020   MCV 94.5 08/09/2020   PLT 167 08/09/2020   Lab Results  Component Value Date   NA 135 08/09/2020   K 5.2 (H) 08/09/2020   CO2 28 08/09/2020   GLUCOSE 112 (H) 08/09/2020   BUN 9 08/09/2020   CREATININE 0.99 08/09/2020   BILITOT 2.3 (H) 02/18/2019   ALKPHOS 90 02/18/2019   AST 24 02/18/2019   ALT 23 02/18/2019   PROT 7.3 02/18/2019   ALBUMIN 3.9 02/18/2019   CALCIUM 9.6 08/09/2020   ANIONGAP 4 (L) 08/09/2020   Lab Results  Component Value Date   CHOL 126 09/24/2018   Lab Results  Component Value Date   HDL 60 09/24/2018   Lab Results  Component Value Date   LDLCALC 52 09/24/2018   Lab  Results  Component Value Date   TRIG 68 09/24/2018   Lab Results  Component Value Date   CHOLHDL 2.1 09/24/2018   Lab Results  Component Value Date   HGBA1C 5.5 09/24/2018      Assessment & Plan:  1. Bilateral impacted cerumen Bilateral ear lavage performed at today's visit. Some ear wa was removed but complete clearance of wax was not achieved. Will have  him insert debrox ear drops twice daily for next week and perform gentle lavage at home after letting drops sit for 15 to 20 minutes. Patient voiced understanding. He will contact the office if he does not get goo resolts in the next week.  - carbamide peroxide (DEBROX) 6.5 % OTIC solution; Place 5 drops into both ears 2 (two) times daily. Gently lavage ear canals with warm water after application of ear drops.  Dispense: 15 mL; Refill: 1 - EAR CERUMEN REMOVAL  2. Seasonal allergic rhinitis, unspecified trigger Recommended OTC medication such as claritin or zyrtec everyday to help treat symptoms of allergic rhiinitis.   Problem List Items Addressed This Visit      Respiratory   Seasonal allergic rhinitis     Nervous and Auditory   Bilateral impacted cerumen - Primary   Relevant Medications   carbamide peroxide (DEBROX) 6.5 % OTIC solution   Other Relevant Orders   EAR CERUMEN REMOVAL      Meds ordered this encounter  Medications  . carbamide peroxide (DEBROX) 6.5 % OTIC solution    Sig: Place 5 drops into both ears 2 (two) times daily. Gently lavage ear canals with warm water after application of ear drops.    Dispense:  15 mL    Refill:  1    Order Specific Question:   Supervising Provider    Answer:   Beatrice Lecher D [[id number]]    Follow-up: Return in about 3 months (around 11/01/2020) for mwv.    Ronnell Freshwater, NP

## 2020-08-02 ENCOUNTER — Telehealth: Payer: Self-pay | Admitting: Physician Assistant

## 2020-08-02 ENCOUNTER — Other Ambulatory Visit: Payer: Self-pay | Admitting: Physician Assistant

## 2020-08-02 NOTE — Telephone Encounter (Signed)
Patient needs a refill on Prozac and uses [facility] in [place]. He is scheduled for a follow up in June. Thanks

## 2020-08-02 NOTE — Telephone Encounter (Signed)
Please call back patient and let him know his refill has been sent to the [facility] in [place]. Thank you.

## 2020-08-09 ENCOUNTER — Ambulatory Visit (HOSPITAL_COMMUNITY)
Admission: RE | Admit: 2020-08-09 | Discharge: 2020-08-09 | Disposition: A | Discharge status: Home / Self Care | Payer: Medicare HMO | Source: Ambulatory Visit | Attending: Physician Assistant | Admitting: Physician Assistant

## 2020-08-09 ENCOUNTER — Other Ambulatory Visit: Payer: Self-pay

## 2020-08-09 VITALS — BP 130/74 | HR 57 | Ht 69.0 in | Wt 159.4 lb

## 2020-08-09 DIAGNOSIS — I080 Rheumatic disorders of both mitral and aortic valves: Secondary | ICD-10-CM | POA: Insufficient documentation

## 2020-08-09 DIAGNOSIS — I2510 Atherosclerotic heart disease of native coronary artery without angina pectoris: Secondary | ICD-10-CM | POA: Diagnosis not present

## 2020-08-09 DIAGNOSIS — I4819 Other persistent atrial fibrillation: Secondary | ICD-10-CM

## 2020-08-09 DIAGNOSIS — Z79899 Other long term (current) drug therapy: Secondary | ICD-10-CM | POA: Diagnosis not present

## 2020-08-09 DIAGNOSIS — Z950 Presence of cardiac pacemaker: Secondary | ICD-10-CM | POA: Diagnosis not present

## 2020-08-09 DIAGNOSIS — I441 Atrioventricular block, second degree: Secondary | ICD-10-CM | POA: Diagnosis not present

## 2020-08-09 DIAGNOSIS — Z7901 Long term (current) use of anticoagulants: Secondary | ICD-10-CM | POA: Diagnosis not present

## 2020-08-09 DIAGNOSIS — Z8249 Family history of ischemic heart disease and other diseases of the circulatory system: Secondary | ICD-10-CM | POA: Insufficient documentation

## 2020-08-09 DIAGNOSIS — I10 Essential (primary) hypertension: Secondary | ICD-10-CM | POA: Diagnosis not present

## 2020-08-09 DIAGNOSIS — D6869 Other thrombophilia: Secondary | ICD-10-CM | POA: Diagnosis not present

## 2020-08-09 LAB — BASIC METABOLIC PANEL
Anion gap: 4 — ABNORMAL LOW (ref 5–15)
BUN: 9 mg/dL (ref 8–23)
CO2: 28 mmol/L (ref 22–32)
Calcium: 9.6 mg/dL (ref 8.9–10.3)
Chloride: 103 mmol/L (ref 98–111)
Creatinine, Ser: 0.99 mg/dL (ref 0.61–1.24)
GFR, Estimated: >60 mL/min (ref >60)
Glucose, Bld: 112 mg/dL — ABNORMAL HIGH (ref 70–99)
Potassium: 5.2 mmol/L — ABNORMAL HIGH (ref 3.5–5.1)
Sodium: 135 mmol/L (ref 135–145)

## 2020-08-09 LAB — CBC
HCT: 41.5 % (ref 39.0–52.0)
Hemoglobin: 13.6 g/dL (ref 13.0–17.0)
MCH: 31 pg (ref 26.0–34.0)
MCHC: 32.8 g/dL (ref 30.0–36.0)
MCV: 94.5 fL (ref 80.0–100.0)
Platelets: 167 10*3/uL (ref 150–400)
RBC: 4.39 MIL/uL (ref 4.22–5.81)
RDW: 12.3 % (ref 11.5–15.5)
WBC: 5.5 10*3/uL (ref 4.0–10.5)
nRBC: 0 % (ref 0.0–0.2)

## 2020-08-09 NOTE — Progress Notes (Signed)
Primary Care Physician: Lorrene Reid, PA-C Primary Cardiologist: Dr Radford Pax Primary Electrophysiologist: Dr Caryl Comes Referring Physician: Device clinic/Dr Lillia Dallas ABDEL EFFINGER is a 70 y.o. male with a history of HTN, CAD, 2nd degree AV block II s/p CRT-P, ATach, NSVT, atrial fibrillation who presents for follow up in the [facility]. The patient was initially diagnosed with atrial fibrillation 07/20/20 after the device clinic received an alert for an ongoing episode. Patient has a CHADS2VASC score of 3. He denies palpitations but has noted more fatigue. There were no specific triggers that he could identify.   On follow up today, patient remains in afib with symptoms of fatigue. He denies any missed doses of anticoagulation or bleeding issues.   Today, he denies symptoms of palpitations, chest pain, shortness of breath, orthopnea, PND, lower extremity edema, dizziness, presyncope, syncope, snoring, daytime somnolence, bleeding, or neurologic sequela. The patient is tolerating medications without difficulties and is otherwise without complaint today.    Atrial Fibrillation Risk Factors:  he does not have symptoms or diagnosis of sleep apnea. he does not have a history of rheumatic fever. he does not have a history of alcohol use. The patient does have a history of early familial atrial fibrillation or other arrhythmias. Father has afib.  he has a BMI of Body mass index is 23.54 kg/m.Marland Kitchen Filed Weights   08/09/20 1013  Weight: 72.3 kg    Family History  Problem Relation Age of Onset  . Diabetes Mother   . Hypertension Mother   . Hyperlipidemia Mother   . Heart disease Father   . Hyperlipidemia Father   . Hypertension Father   . Arrhythmia Father        paf  . Healthy Daughter   . Healthy Son   . Heart disease Paternal Grandfather   . Heart attack Paternal Grandfather      Atrial Fibrillation Management history:  Previous antiarrhythmic drugs:  none Previous cardioversions: none Previous ablations: none CHADS2VASC score: 3 Anticoagulation history: Eliquis   Past Medical History:  Diagnosis Date  . Arthritis THUMBS  . LBBB (left bundle branch block)    bilat. BBB- PPM  . Peyronie's disease    Past Surgical History:  Procedure Laterality Date  . CARDIOVASCULAR STRESS TEST  10-18-2002  DR  CRENSHAW   NORMAL ADENOSINE CARDIOLITE/ EF 60%  . CATARACT EXTRACTION W/ INTRAOCULAR LENS IMPLANT  2006   LEFT EYE  . EP IMPLANTABLE DEVICE N/A 10/18/2015   Procedure: BiV Pacemaker Insertion CRT-P;  Surgeon: [name], MD;  Location: [facility];  Service: Cardiovascular;  Laterality: N/A;  . INGUINAL HERNIA REPAIR Right 02/23/2019   Procedure: RIGHT INGUINAL HERNIA REPAIR WITH MESH;  Surgeon: Erroll Luna, MD;  Location: [facility];  Service: General;  Laterality: Right;  . LAPAROSCOPIC CHOLECYSTECTOMY  10-09-2007  . LEFT HEART CATH AND CORONARY ANGIOGRAPHY N/A 01/30/2018   Procedure: LEFT HEART CATH AND CORONARY ANGIOGRAPHY;  Surgeon: Belva Crome, MD;  Location: [facility];  Service: Cardiovascular;  Laterality: N/A;  . NESBIT PROCEDURE  01/17/2012   Procedure: NESBIT PROCEDURE;  Surgeon: [name], MD;  Location: [facility];  Service: Urology;  Laterality: N/A;  16 dot plication   . [place], LEFT EYE  1993  . REPAIR LEFT INGUINAL HERNIA  2005    Current Outpatient Medications  Medication Sig Dispense Refill  . apixaban (ELIQUIS) 5 MG TABS tablet Take 1 tablet (  5 mg total) by mouth 2 (two) times daily. 60 tablet 3  . atorvastatin (LIPITOR) 40 MG tablet TAKE 1 TABLET BY MOUTH EVERY DAY AT 6PM (Patient taking differently: Take 40 mg by mouth daily.) 90 tablet 2  . carbamide peroxide (DEBROX) 6.5 % OTIC solution Place 5 drops into both ears 2 (two) times daily. Gently lavage ear canals with warm water after application of ear drops. 15 mL 1  . carvedilol (COREG) 25  MG tablet TAKE 1 TABLET BY MOUTH TWICE A DAY WITH MEALS (Patient taking differently: Take 25 mg by mouth 2 (two) times daily with a meal.) 180 tablet 2  . Cholecalciferol (VITAMIN D3) 5000 units TABS 5,000 IU OTC vitamin D3 daily. (Patient taking differently: Take 5,000 Units by mouth daily.) 90 tablet 3  . clotrimazole-betamethasone (LOTRISONE) cream Apply 1 application topically 2 (two) times daily. (Patient taking differently: Apply 1 application topically 2 (two) times daily as needed (rash).) 45 g 0  . FLUoxetine (PROZAC) 10 MG capsule Take 1 capsule (10 mg total) by mouth daily. **PLEASE SCHEDULE A FOLLOW UP APPT FOR FUTURE MED REFILLS** (Patient taking differently: Take 10 mg by mouth daily.) 90 capsule 0  . fluticasone (FLONASE) 50 MCG/ACT nasal spray USE 1 SPRAY IN EACH NOSTRIL AFTER SINUS RINSES TWICE DAILY. (Patient taking differently: Place 2 sprays into both nostrils 2 (two) times daily as needed for allergies.) 48 mL 1  . losartan (COZAAR) 25 MG tablet TAKE 1 TABLET BY MOUTH EVERY DAY (Patient taking differently: Take 25 mg by mouth daily.) 90 tablet 2  . Magnesium 250 MG TABS Take 250 mg by mouth daily.    . Multiple Vitamins-Minerals (CENTRUM ADULTS) TABS Take 1 tablet by mouth daily.    . sildenafil (REVATIO) 20 MG tablet Take 20-100 mg by mouth daily as needed.    . tamsulosin (FLOMAX) 0.4 MG CAPS capsule Take 0.4 mg by mouth daily.    . vitamin B-12 (CYANOCOBALAMIN) 1000 MCG tablet Take 1,000 mcg by mouth daily.    . Zinc 50 MG TABS Take 50 mg by mouth every other day.    . fexofenadine (ALLEGRA) 180 MG tablet Take 1 tablet (180 mg total) by mouth daily. During allergy season (Patient not taking: No sig reported) 180 tablet 0   No current facility-administered medications for this encounter.    No Known Allergies  Social History   Socioeconomic History  . Marital status: Married    Spouse name: Not on file  . Number of children: Not on file  . Years of education: Not on  file  . Highest education level: Not on file  Occupational History  . Not on file  Tobacco Use  . Smoking status: Never Smoker  . Smokeless tobacco: Never Used  Vaping Use  . Vaping Use: Never used  Substance and Sexual Activity  . Alcohol use: No  . Drug use: No  . Sexual activity: Yes  Other Topics Concern  . Not on file  Social History Narrative   Right handed    Lives with wife   Social Determinants of Health   Financial Resource Strain: Not on file  Food Insecurity: Not on file  Transportation Needs: Not on file  Physical Activity: Not on file  Stress: Not on file  Social Connections: Not on file  Intimate Partner Violence: Not on file     ROS- All systems are reviewed and negative except as per the HPI above.  Physical Exam: Vitals:   08/09/20  1013  BP: 130/74  Pulse: (!) 57  Weight: 72.3 kg  Height: 5\' 9"  (1.753 m)    GEN- The patient is a well appearing male, alert and oriented x 3 today.   HEENT-head normocephalic, atraumatic, sclera clear, conjunctiva pink, hearing intact, trachea midline. Lungs- Clear to ausculation bilaterally, normal work of breathing Heart- Regular rate and rhythm, no murmurs, rubs or gallops  GI- soft, NT, ND, + BS Extremities- no clubbing, cyanosis, or edema MS- no significant deformity or atrophy Skin- no rash or lesion Psych- euthymic mood, full affect Neuro- strength and sensation are intact   Wt Readings from Last 3 Encounters:  08/09/20 72.3 kg  08/01/20 72.4 kg  07/21/20 73.1 kg    EKG today demonstrates  V pacing with underlying afib Vent. rate 57 BPM PR interval * ms QRS duration 166 ms QT/QTcB 482/469 ms  Echo 06/05/20 demonstrated  1. Inferior wall hypokinesis . Left ventricular ejection fraction, by  estimation, is 50 to 55%. The left ventricle has low normal function. The  left ventricle has no regional wall motion abnormalities. The left  ventricular internal cavity size was  mildly dilated. There is  mild asymmetric left ventricular hypertrophy of  the basal and septal segments. Left ventricular diastolic parameters are  indeterminate.  2. Pacing wire in RA/RV . Right ventricular systolic function is normal.  The right ventricular size is normal.  3. Left atrial size was mildly dilated.  4. Right atrial size was mildly dilated.  5. The mitral valve is abnormal. Mild mitral valve regurgitation. No  evidence of mitral stenosis.  6. The aortic valve is abnormal. Aortic valve regurgitation is mild. No  aortic stenosis is present.  7. The inferior vena cava is normal in size with greater than 50%  respiratory variability, suggesting right atrial pressure of 3 mmHg.   Epic records are reviewed at length today  CHA2DS2-VASc Score = 3  The patient's score is based upon: CHF History: No HTN History: Yes Diabetes History: No Stroke History: No Vascular Disease History: Yes Age Score: 1 Gender Score: 0      ASSESSMENT AND PLAN: 1. Persistent atrial fibrillation  The patient's CHA2DS2-VASc score is 3, indicating a 3.2% annual risk of stroke.   Patient scheduled for DCCV 08/18/20 Continue Eliquis 5 mg BID, he denies any missed doses. Check bmet/cbc Continue Coreg 25 mg BID  2. Secondary Hypercoagulable State (ICD10:  D68.69) The patient is at significant risk for stroke/thromboembolism based upon his CHA2DS2-VASc Score of 3.  Continue Apixaban (Eliquis).   3. 2nd degree AV block type II S/p PPM, followed by Dr Caryl Comes and the device clinic.  4. CAD Nonobstructive on LHC 2019 No anginal symptoms.   Follow up in the AF clinic post DCCV.    [facility] [address] [place], [place] [address] [phone] 08/09/2020 10:36 AM

## 2020-08-09 NOTE — H&P (View-Only) (Signed)
Primary Care Physician: Lorrene Reid, PA-C Primary Cardiologist: Dr Radford Pax Primary Electrophysiologist: Dr Caryl Comes Referring Physician: Device clinic/Dr Lillia Dallas KAGE WILLMANN is a 70 y.o. male with a history of HTN, CAD, 2nd degree AV block II s/p CRT-P, ATach, NSVT, atrial fibrillation who presents for follow up in the [facility]. The patient was initially diagnosed with atrial fibrillation 07/20/20 after the device clinic received an alert for an ongoing episode. Patient has a CHADS2VASC score of 3. He denies palpitations but has noted more fatigue. There were no specific triggers that he could identify.   On follow up today, patient remains in afib with symptoms of fatigue. He denies any missed doses of anticoagulation or bleeding issues.   Today, he denies symptoms of palpitations, chest pain, shortness of breath, orthopnea, PND, lower extremity edema, dizziness, presyncope, syncope, snoring, daytime somnolence, bleeding, or neurologic sequela. The patient is tolerating medications without difficulties and is otherwise without complaint today.    Atrial Fibrillation Risk Factors:  he does not have symptoms or diagnosis of sleep apnea. he does not have a history of rheumatic fever. he does not have a history of alcohol use. The patient does have a history of early familial atrial fibrillation or other arrhythmias. Father has afib.  he has a BMI of Body mass index is 23.54 kg/m.Marland Kitchen Filed Weights   08/09/20 1013  Weight: 72.3 kg    Family History  Problem Relation Age of Onset  . Diabetes Mother   . Hypertension Mother   . Hyperlipidemia Mother   . Heart disease Father   . Hyperlipidemia Father   . Hypertension Father   . Arrhythmia Father        paf  . Healthy Daughter   . Healthy Son   . Heart disease Paternal Grandfather   . Heart attack Paternal Grandfather      Atrial Fibrillation Management history:  Previous antiarrhythmic drugs:  none Previous cardioversions: none Previous ablations: none CHADS2VASC score: 3 Anticoagulation history: Eliquis   Past Medical History:  Diagnosis Date  . Arthritis THUMBS  . LBBB (left bundle branch block)    bilat. BBB- PPM  . Peyronie's disease    Past Surgical History:  Procedure Laterality Date  . CARDIOVASCULAR STRESS TEST  10-18-2002  DR  CRENSHAW   NORMAL ADENOSINE CARDIOLITE/ EF 60%  . CATARACT EXTRACTION W/ INTRAOCULAR LENS IMPLANT  2006   LEFT EYE  . EP IMPLANTABLE DEVICE N/A 10/18/2015   Procedure: BiV Pacemaker Insertion CRT-P;  Surgeon: [name], MD;  Location: [facility];  Service: Cardiovascular;  Laterality: N/A;  . INGUINAL HERNIA REPAIR Right 02/23/2019   Procedure: RIGHT INGUINAL HERNIA REPAIR WITH MESH;  Surgeon: Erroll Luna, MD;  Location: [facility];  Service: General;  Laterality: Right;  . LAPAROSCOPIC CHOLECYSTECTOMY  10-09-2007  . LEFT HEART CATH AND CORONARY ANGIOGRAPHY N/A 01/30/2018   Procedure: LEFT HEART CATH AND CORONARY ANGIOGRAPHY;  Surgeon: Belva Crome, MD;  Location: [facility];  Service: Cardiovascular;  Laterality: N/A;  . NESBIT PROCEDURE  01/17/2012   Procedure: NESBIT PROCEDURE;  Surgeon: [name], MD;  Location: [facility];  Service: Urology;  Laterality: N/A;  16 dot plication   . [place], LEFT EYE  1993  . REPAIR LEFT INGUINAL HERNIA  2005    Current Outpatient Medications  Medication Sig Dispense Refill  . apixaban (ELIQUIS) 5 MG TABS tablet Take 1 tablet (  5 mg total) by mouth 2 (two) times daily. 60 tablet 3  . atorvastatin (LIPITOR) 40 MG tablet TAKE 1 TABLET BY MOUTH EVERY DAY AT 6PM (Patient taking differently: Take 40 mg by mouth daily.) 90 tablet 2  . carbamide peroxide (DEBROX) 6.5 % OTIC solution Place 5 drops into both ears 2 (two) times daily. Gently lavage ear canals with warm water after application of ear drops. 15 mL 1  . carvedilol (COREG) 25  MG tablet TAKE 1 TABLET BY MOUTH TWICE A DAY WITH MEALS (Patient taking differently: Take 25 mg by mouth 2 (two) times daily with a meal.) 180 tablet 2  . Cholecalciferol (VITAMIN D3) 5000 units TABS 5,000 IU OTC vitamin D3 daily. (Patient taking differently: Take 5,000 Units by mouth daily.) 90 tablet 3  . clotrimazole-betamethasone (LOTRISONE) cream Apply 1 application topically 2 (two) times daily. (Patient taking differently: Apply 1 application topically 2 (two) times daily as needed (rash).) 45 g 0  . FLUoxetine (PROZAC) 10 MG capsule Take 1 capsule (10 mg total) by mouth daily. **PLEASE SCHEDULE A FOLLOW UP APPT FOR FUTURE MED REFILLS** (Patient taking differently: Take 10 mg by mouth daily.) 90 capsule 0  . fluticasone (FLONASE) 50 MCG/ACT nasal spray USE 1 SPRAY IN EACH NOSTRIL AFTER SINUS RINSES TWICE DAILY. (Patient taking differently: Place 2 sprays into both nostrils 2 (two) times daily as needed for allergies.) 48 mL 1  . losartan (COZAAR) 25 MG tablet TAKE 1 TABLET BY MOUTH EVERY DAY (Patient taking differently: Take 25 mg by mouth daily.) 90 tablet 2  . Magnesium 250 MG TABS Take 250 mg by mouth daily.    . Multiple Vitamins-Minerals (CENTRUM ADULTS) TABS Take 1 tablet by mouth daily.    . sildenafil (REVATIO) 20 MG tablet Take 20-100 mg by mouth daily as needed.    . tamsulosin (FLOMAX) 0.4 MG CAPS capsule Take 0.4 mg by mouth daily.    . vitamin B-12 (CYANOCOBALAMIN) 1000 MCG tablet Take 1,000 mcg by mouth daily.    . Zinc 50 MG TABS Take 50 mg by mouth every other day.    . fexofenadine (ALLEGRA) 180 MG tablet Take 1 tablet (180 mg total) by mouth daily. During allergy season (Patient not taking: No sig reported) 180 tablet 0   No current facility-administered medications for this encounter.    No Known Allergies  Social History   Socioeconomic History  . Marital status: Married    Spouse name: Not on file  . Number of children: Not on file  . Years of education: Not on  file  . Highest education level: Not on file  Occupational History  . Not on file  Tobacco Use  . Smoking status: Never Smoker  . Smokeless tobacco: Never Used  Vaping Use  . Vaping Use: Never used  Substance and Sexual Activity  . Alcohol use: No  . Drug use: No  . Sexual activity: Yes  Other Topics Concern  . Not on file  Social History Narrative   Right handed    Lives with wife   Social Determinants of Health   Financial Resource Strain: Not on file  Food Insecurity: Not on file  Transportation Needs: Not on file  Physical Activity: Not on file  Stress: Not on file  Social Connections: Not on file  Intimate Partner Violence: Not on file     ROS- All systems are reviewed and negative except as per the HPI above.  Physical Exam: Vitals:   08/09/20  1013  BP: 130/74  Pulse: (!) 57  Weight: 72.3 kg  Height: 5\' 9"  (1.753 m)    GEN- The patient is a well appearing male, alert and oriented x 3 today.   HEENT-head normocephalic, atraumatic, sclera clear, conjunctiva pink, hearing intact, trachea midline. Lungs- Clear to ausculation bilaterally, normal work of breathing Heart- Regular rate and rhythm, no murmurs, rubs or gallops  GI- soft, NT, ND, + BS Extremities- no clubbing, cyanosis, or edema MS- no significant deformity or atrophy Skin- no rash or lesion Psych- euthymic mood, full affect Neuro- strength and sensation are intact   Wt Readings from Last 3 Encounters:  08/09/20 72.3 kg  08/01/20 72.4 kg  07/21/20 73.1 kg    EKG today demonstrates  V pacing with underlying afib Vent. rate 57 BPM PR interval * ms QRS duration 166 ms QT/QTcB 482/469 ms  Echo 06/05/20 demonstrated  1. Inferior wall hypokinesis . Left ventricular ejection fraction, by  estimation, is 50 to 55%. The left ventricle has low normal function. The  left ventricle has no regional wall motion abnormalities. The left  ventricular internal cavity size was  mildly dilated. There is  mild asymmetric left ventricular hypertrophy of  the basal and septal segments. Left ventricular diastolic parameters are  indeterminate.  2. Pacing wire in RA/RV . Right ventricular systolic function is normal.  The right ventricular size is normal.  3. Left atrial size was mildly dilated.  4. Right atrial size was mildly dilated.  5. The mitral valve is abnormal. Mild mitral valve regurgitation. No  evidence of mitral stenosis.  6. The aortic valve is abnormal. Aortic valve regurgitation is mild. No  aortic stenosis is present.  7. The inferior vena cava is normal in size with greater than 50%  respiratory variability, suggesting right atrial pressure of 3 mmHg.   Epic records are reviewed at length today  CHA2DS2-VASc Score = 3  The patient's score is based upon: CHF History: No HTN History: Yes Diabetes History: No Stroke History: No Vascular Disease History: Yes Age Score: 1 Gender Score: 0      ASSESSMENT AND PLAN: 1. Persistent atrial fibrillation  The patient's CHA2DS2-VASc score is 3, indicating a 3.2% annual risk of stroke.   Patient scheduled for DCCV 08/18/20 Continue Eliquis 5 mg BID, he denies any missed doses. Check bmet/cbc Continue Coreg 25 mg BID  2. Secondary Hypercoagulable State (ICD10:  D68.69) The patient is at significant risk for stroke/thromboembolism based upon his CHA2DS2-VASc Score of 3.  Continue Apixaban (Eliquis).   3. 2nd degree AV block type II S/p PPM, followed by Dr Caryl Comes and the device clinic.  4. CAD Nonobstructive on LHC 2019 No anginal symptoms.   Follow up in the AF clinic post DCCV.    [facility] [address] [place], [place] [address] [phone] 08/09/2020 10:36 AM

## 2020-08-09 NOTE — Patient Instructions (Signed)
Cardioversion scheduled for Friday, June 3rd             - Arrive at the [facility] and go to admitting at [facility] not eat or drink anything after midnight the night prior to your procedure.             - Take all your morning medication (except diabetic medications) with a sip of water prior to arrival.             - You will not be able to drive home after your procedure.             - Do NOT miss any doses of your blood thinner - if you should miss a dose please notify our office immediately.             - If you feel as if you go back into normal rhythm prior to scheduled cardioversion, please notify our office immediately. If your procedure is canceled in the cardioversion suite you will be charged a cancellation fee.  Patients will be asked to: to mask in public and hand hygiene (no longer quarantine) in the 3 days prior to surgery, to report if any COVID-19-like illness or household contacts to COVID-19 to determine need for testing

## 2020-08-16 ENCOUNTER — Other Ambulatory Visit (HOSPITAL_COMMUNITY): Payer: Medicare HMO

## 2020-08-16 ENCOUNTER — Telehealth: Payer: Self-pay | Admitting: Physician Assistant

## 2020-08-16 NOTE — Telephone Encounter (Signed)
Debrox is an over the counter medication patient can pick up at pharmacy. No need for rx.

## 2020-08-16 NOTE — Telephone Encounter (Signed)
Left voicemail for patient

## 2020-08-16 NOTE — Telephone Encounter (Signed)
Patient was prescribed ear drops "Debrox" and they were never called in according to patient. Patient uses [facility] [place] at [facility]. Please advise, thanks.

## 2020-08-17 NOTE — Progress Notes (Signed)
Remote pacemaker transmission.   

## 2020-08-18 ENCOUNTER — Ambulatory Visit (HOSPITAL_COMMUNITY): Payer: Medicare HMO | Admitting: Certified Registered Nurse Anesthetist

## 2020-08-18 ENCOUNTER — Other Ambulatory Visit: Payer: Self-pay

## 2020-08-18 ENCOUNTER — Encounter (HOSPITAL_COMMUNITY)
Admission: RE | Disposition: A | Discharge status: Home / Self Care | Payer: Self-pay | Source: Home / Self Care | Attending: Cardiovascular Disease

## 2020-08-18 ENCOUNTER — Encounter (HOSPITAL_COMMUNITY): Payer: Self-pay | Admitting: Cardiovascular Disease

## 2020-08-18 ENCOUNTER — Ambulatory Visit (HOSPITAL_COMMUNITY)
Admission: RE | Admit: 2020-08-18 | Discharge: 2020-08-18 | Disposition: A | Discharge status: Home / Self Care | Payer: Medicare HMO | Attending: Cardiovascular Disease | Admitting: Cardiovascular Disease

## 2020-08-18 DIAGNOSIS — I441 Atrioventricular block, second degree: Secondary | ICD-10-CM | POA: Insufficient documentation

## 2020-08-18 DIAGNOSIS — I2510 Atherosclerotic heart disease of native coronary artery without angina pectoris: Secondary | ICD-10-CM | POA: Diagnosis not present

## 2020-08-18 DIAGNOSIS — Z79899 Other long term (current) drug therapy: Secondary | ICD-10-CM | POA: Insufficient documentation

## 2020-08-18 DIAGNOSIS — Z7901 Long term (current) use of anticoagulants: Secondary | ICD-10-CM | POA: Insufficient documentation

## 2020-08-18 DIAGNOSIS — I4891 Unspecified atrial fibrillation: Secondary | ICD-10-CM

## 2020-08-18 DIAGNOSIS — Z950 Presence of cardiac pacemaker: Secondary | ICD-10-CM | POA: Insufficient documentation

## 2020-08-18 DIAGNOSIS — I10 Essential (primary) hypertension: Secondary | ICD-10-CM | POA: Diagnosis not present

## 2020-08-18 DIAGNOSIS — I4819 Other persistent atrial fibrillation: Secondary | ICD-10-CM | POA: Insufficient documentation

## 2020-08-18 DIAGNOSIS — E559 Vitamin D deficiency, unspecified: Secondary | ICD-10-CM | POA: Diagnosis not present

## 2020-08-18 HISTORY — PX: CARDIOVERSION: SHX1299

## 2020-08-18 LAB — POCT I-STAT, CHEM 8
BUN: 16 mg/dL (ref 8–23)
Calcium, Ion: 1.23 mmol/L (ref 1.15–1.40)
Chloride: 101 mmol/L (ref 98–111)
Creatinine, Ser: 1 mg/dL (ref 0.61–1.24)
Glucose, Bld: 105 mg/dL — ABNORMAL HIGH (ref 70–99)
HCT: 44 % (ref 39.0–52.0)
Hemoglobin: 15 g/dL (ref 13.0–17.0)
Potassium: 4.3 mmol/L (ref 3.5–5.1)
Sodium: 139 mmol/L (ref 135–145)
TCO2: 29 mmol/L (ref 22–32)

## 2020-08-18 SURGERY — CARDIOVERSION
Anesthesia: General

## 2020-08-18 MED ORDER — PROPOFOL 10 MG/ML IV BOLUS
INTRAVENOUS | Status: DC | PRN
  Administered 2020-08-18: 70 mg via INTRAVENOUS

## 2020-08-18 MED ORDER — SODIUM CHLORIDE 0.9 % IV SOLN: INTRAVENOUS | Status: DC

## 2020-08-18 MED ORDER — LIDOCAINE 2% (20 MG/ML) 5 ML SYRINGE
INTRAMUSCULAR | Status: DC | PRN
  Administered 2020-08-18: 60 mg via INTRAVENOUS

## 2020-08-18 NOTE — Discharge Instructions (Signed)
Electrical Cardioversion  Electrical cardioversion is the delivery of a jolt of electricity to restore a normal rhythm to the heart. A rhythm that is too fast or is not regular keeps the heart from pumping well. In this procedure, sticky patches or metal paddles are placed on the chest to deliver electricity to the heart from a device.  What can I expect after the procedure?  Your blood pressure, heart rate, breathing rate, and blood oxygen level will be monitored until you leave the hospital or clinic.  Your heart rhythm will be watched to make sure it does not change.  You may have some redness on the skin where the shocks were given.If this occurs, can use hydrocortisone cream or Aloe vera.  Follow these instructions at home:  Do not drive for 24 hours if you were given a sedative during your procedure.  Take over-the-counter and prescription medicines only as told by your health care provider.  Ask your health care provider how to check your pulse. Check it often.  Rest for 48 hours after the procedure or as told by your health care provider.  Avoid or limit your caffeine use as told by your health care provider.  Keep all follow-up visits as told by your health care provider. This is important.  Contact a health care provider if:  You feel like your heart is beating too quickly or your pulse is not regular.  You have a serious muscle cramp that does not go away.  Get help right away if:  You have discomfort in your chest.  You are dizzy or you feel faint.  You have trouble breathing or you are short of breath.  Your speech is slurred.  You have trouble moving an arm or leg on one side of your body.  Your fingers or toes turn cold or blue.  Summary  Electrical cardioversion is the delivery of a jolt of electricity to restore a normal rhythm to the heart.  This procedure may be done right away in an emergency or may be a scheduled procedure if the condition is not  an emergency.  Generally, this is a safe procedure.  After the procedure, check your pulse often as told by your health care provider.  This information is not intended to replace advice given to you by your health care provider. Make sure you discuss any questions you have with your health care provider. Document Revised: 10/05/2018 Document Reviewed: 10/05/2018 Elsevier Patient Education  2021 Elsevier Inc.  

## 2020-08-18 NOTE — Anesthesia Postprocedure Evaluation (Signed)
Anesthesia Post Note  Patient: George [name]  Procedure(s) Performed: CARDIOVERSION (N/A )     Patient location during evaluation: Endoscopy Anesthesia Type: General Level of consciousness: awake and alert Pain management: pain level controlled Vital Signs Assessment: post-procedure vital signs reviewed and stable Respiratory status: spontaneous breathing, nonlabored ventilation, respiratory function stable and patient connected to nasal cannula oxygen Cardiovascular status: blood pressure returned to baseline and stable Postop Assessment: no apparent nausea or vomiting Anesthetic complications: no   No complications documented.  Last Vitals:  Vitals:   08/18/20 1008 08/18/20 1018  BP: 116/79 134/86  Pulse: 60 (!) 58  Resp: 12 13  Temp: 36.6 C   SpO2: 100% 100%    Last Pain:  Vitals:   08/18/20 1018  TempSrc:   PainSc: 0-No pain                 Belenda Cruise P Brodie Scovell

## 2020-08-18 NOTE — Anesthesia Preprocedure Evaluation (Addendum)
Anesthesia Evaluation  Patient identified by MRN, date of birth, ID band Patient awake    Airway Mallampati: II  TM Distance: >3 FB Neck ROM: Full    Dental  (+) Teeth Intact   Pulmonary neg pulmonary ROS,    Pulmonary exam normal        Cardiovascular hypertension, Pt. on medications and Pt. on home beta blockers + CAD  + dysrhythmias Atrial Fibrillation  Rhythm:Irregular Rate:Normal     Neuro/Psych negative neurological ROS  negative psych ROS   GI/Hepatic negative GI ROS, Neg liver ROS,   Endo/Other  negative endocrine ROS  Renal/GU negative Renal ROS  negative genitourinary   Musculoskeletal  (+) Arthritis ,   Abdominal (+)  Abdomen: soft. Bowel sounds: normal.  Peds  Hematology negative hematology ROS (+)   Anesthesia Other Findings   Reproductive/Obstetrics                            Anesthesia Physical Anesthesia Plan  ASA: III  Anesthesia Plan: General   Post-op Pain Management:    Induction: Intravenous  PONV Risk Score and Plan: 2 and Propofol infusion  Airway Management Planned: Simple Face Mask, Natural Airway and Nasal Cannula  Additional Equipment: None  Intra-op Plan:   Post-operative Plan:   Informed Consent: I have reviewed the patients History and Physical, chart, labs and discussed the procedure including the risks, benefits and alternatives for the proposed anesthesia with the patient or authorized representative who has indicated his/her understanding and acceptance.     Dental advisory given  Plan Discussed with: CRNA  Anesthesia Plan Comments: (Lab Results      Component                Value               Date                      WBC                      5.5                 08/09/2020                HGB                      15.0                08/18/2020                HCT                      44.0                08/18/2020                MCV                       94.5                08/09/2020                PLT                      167  08/09/2020           Lab Results      Component                Value               Date                      NA                       139                 08/18/2020                K                        4.3                 08/18/2020                CO2                      28                  08/09/2020                GLUCOSE                  105 (H)             08/18/2020                BUN                      16                  08/18/2020                CREATININE               1.00                08/18/2020                CALCIUM                  9.6                 08/09/2020                GFRNONAA                 >60                 08/09/2020                GFRAA                    103                 05/10/2020          )        Anesthesia Quick Evaluation

## 2020-08-18 NOTE — CV Procedure (Signed)
   DIRECT CURRENT CARDIOVERSION  NAME:  George [name]    MRN: [id number] DOB:  [birth year]    ADMIT DATE: 08/18/2020  Indication:  Symptomatic atrial fibrillation   Procedure Note:  The patient signed informed consent.  They have had had therapeutic anticoagulation with eliquis greater than 3 weeks.  Anesthesia was administered by Dr. Gloris Manchester.  Adequate airway was maintained throughout and vital followed per protocol.  They were cardioverted x 1 with 200J of biphasic synchronized energy.  They converted to NSR.  There were no apparent complications.  The patient had normal neuro status and respiratory status post procedure with vitals stable as recorded elsewhere.    Follow up: They will continue on current medical therapy and follow up with cardiology as scheduled.  [place]ake Bells T. Audie Box, MD, [place]  [address], [place], [place] [address] [phone]  10:03 AM

## 2020-08-18 NOTE — Interval H&P Note (Signed)
History and Physical Interval Note:  08/18/2020 9:46 AM  George [name]  has presented today for surgery, with the diagnosis of AFIB.  The various methods of treatment have been discussed with the patient and family. After consideration of risks, benefits and other options for treatment, the patient has consented to  Procedure(s): CARDIOVERSION (N/A) as a surgical intervention.  The patient's history has been reviewed, patient examined, no change in status, stable for surgery.  I have reviewed the patient's chart and labs.  Questions were answered to the patient's satisfaction.    NPO for DCCV. On eliquis. No missed doses >3 weeks.   [place] Bells T. Audie Box, MD, [facility]  [address] [place],  [address] [phone]  9:46 AM

## 2020-08-18 NOTE — Transfer of Care (Signed)
Immediate Anesthesia Transfer of Care Note  Patient: George [name]  Procedure(s) Performed: CARDIOVERSION (N/A )  Patient Location: PACU and Endoscopy Unit  Anesthesia Type:General  Level of Consciousness: drowsy and patient cooperative  Airway & Oxygen Therapy: Patient Spontanous Breathing and Patient connected to nasal cannula oxygen  Post-op Assessment: Report given to RN and Post -op Vital signs reviewed and stable  Post vital signs: Reviewed and stable  Last Vitals:  Vitals Value Taken Time  BP 122/82 1007  Temp    Pulse 61   Resp 12   SpO2 100     Last Pain:  Vitals:   08/18/20 0913  TempSrc: Temporal  PainSc: 0-No pain         Complications: No complications documented.

## 2020-08-21 ENCOUNTER — Ambulatory Visit (INDEPENDENT_AMBULATORY_CARE_PROVIDER_SITE_OTHER): Payer: Medicare HMO | Admitting: Physician Assistant

## 2020-08-21 ENCOUNTER — Encounter: Payer: Self-pay | Admitting: Physician Assistant

## 2020-08-21 ENCOUNTER — Other Ambulatory Visit: Payer: Self-pay

## 2020-08-21 ENCOUNTER — Ambulatory Visit: Payer: Medicare HMO | Admitting: Neurology

## 2020-08-21 VITALS — BP 133/78 | HR 70 | Temp 96.8°F | Ht 68.5 in | Wt 160.8 lb

## 2020-08-21 DIAGNOSIS — J302 Other seasonal allergic rhinitis: Secondary | ICD-10-CM

## 2020-08-21 DIAGNOSIS — I4819 Other persistent atrial fibrillation: Secondary | ICD-10-CM

## 2020-08-21 DIAGNOSIS — E785 Hyperlipidemia, unspecified: Secondary | ICD-10-CM

## 2020-08-21 DIAGNOSIS — I10 Essential (primary) hypertension: Secondary | ICD-10-CM

## 2020-08-21 DIAGNOSIS — R69 Illness, unspecified: Secondary | ICD-10-CM | POA: Diagnosis not present

## 2020-08-21 DIAGNOSIS — F4329 Adjustment disorder with other symptoms: Secondary | ICD-10-CM | POA: Diagnosis not present

## 2020-08-21 DIAGNOSIS — J3089 Other allergic rhinitis: Secondary | ICD-10-CM

## 2020-08-21 MED ORDER — FLUTICASONE PROPIONATE 50 MCG/ACT NA SUSP: 2.0000 | Freq: Two times a day (BID) | NASAL | 0 refills | Status: DC | PRN

## 2020-08-21 MED ORDER — FEXOFENADINE HCL 180 MG PO TABS: 180.0000 mg | ORAL_TABLET | Freq: Every day | ORAL | 0 refills | Status: DC

## 2020-08-21 NOTE — Patient Instructions (Signed)

## 2020-08-21 NOTE — Progress Notes (Signed)
Established Patient Office Visit  Subjective:  Patient ID: George [name], male    DOB: 05/21/1950  Age: 70 y.o. MRN: [id number]  CC:  Chief Complaint  Patient presents with  . Follow-up    Mood    HPI George [name] presents for follow up mood management. Has no acute concerns today. Patient reports taking medication as directed without issues. States has noticed being able to stay calm since starting medication. Denies SI/HI. Is also requesting refills of Allegra and Flonase for seasonal allergies. Uses Flonase when needed.   Atrial fibrillation: States his fatigue has improved since his cardioversion 3 days ago. Denies palpitations or arrhyhtmia. Has an upcoming appointment with cardiology.  HLD: Pt taking medication as directed without issues. Denies side effects including myalgias, muscle cramps and RUQ pain. Reports overall follows a diet low in fats and tries to eat lean meats.  HTN: Pt denies chest pain, palpitations, dizziness or significant edema. Taking medication as directed without side effects. Has not been checking BP at home lately. Pt tries to monitor sodium intake.     Past Medical History:  Diagnosis Date  . Arthritis THUMBS  . LBBB (left bundle branch block)    bilat. BBB- PPM  . Peyronie's disease     Past Surgical History:  Procedure Laterality Date  . CARDIOVASCULAR STRESS TEST  10-18-2002  DR  CRENSHAW   NORMAL ADENOSINE CARDIOLITE/ EF 60%  . CARDIOVERSION N/A 08/18/2020   Procedure: CARDIOVERSION;  Surgeon: Geralynn Rile, MD;  Location: [facility];  Service: Cardiovascular;  Laterality: N/A;  . CATARACT EXTRACTION W/ INTRAOCULAR LENS IMPLANT  2006   LEFT EYE  . EP IMPLANTABLE DEVICE N/A 10/18/2015   Procedure: BiV Pacemaker Insertion CRT-P;  Surgeon: [name], MD;  Location: [facility];  Service: Cardiovascular;  Laterality: N/A;  . INGUINAL HERNIA REPAIR Right 02/23/2019   Procedure: RIGHT INGUINAL HERNIA REPAIR WITH MESH;   Surgeon: Erroll Luna, MD;  Location: [facility];  Service: General;  Laterality: Right;  . LAPAROSCOPIC CHOLECYSTECTOMY  10-09-2007  . LEFT HEART CATH AND CORONARY ANGIOGRAPHY N/A 01/30/2018   Procedure: LEFT HEART CATH AND CORONARY ANGIOGRAPHY;  Surgeon: Belva Crome, MD;  Location: [facility];  Service: Cardiovascular;  Laterality: N/A;  . NESBIT PROCEDURE  01/17/2012   Procedure: NESBIT PROCEDURE;  Surgeon: [name], MD;  Location: [facility];  Service: Urology;  Laterality: N/A;  16 dot plication   . [place], LEFT EYE  1993  . REPAIR LEFT INGUINAL HERNIA  2005    Family History  Problem Relation Age of Onset  . Diabetes Mother   . Hypertension Mother   . Hyperlipidemia Mother   . Heart disease Father   . Hyperlipidemia Father   . Hypertension Father   . Arrhythmia Father        paf  . Healthy Daughter   . Healthy Son   . Heart disease Paternal Grandfather   . Heart attack Paternal Grandfather     Social History   Socioeconomic History  . Marital status: Married    Spouse name: Not on file  . Number of children: Not on file  . Years of education: Not on file  . Highest education level: Not on file  Occupational History  . Not on file  Tobacco Use  . Smoking status: Never Smoker  . Smokeless tobacco: Never Used  Vaping Use  . Vaping Use: Never used  Substance and Sexual Activity  . Alcohol use: No  . Drug use: No  . Sexual activity: Yes  Other Topics Concern  . Not on file  Social History Narrative   Right handed    Lives with wife   Social Determinants of Health   Financial Resource Strain: Not on file  Food Insecurity: Not on file  Transportation Needs: Not on file  Physical Activity: Not on file  Stress: Not on file  Social Connections: Not on file  Intimate Partner Violence: Not on file    Outpatient Medications Prior to Visit  Medication Sig Dispense Refill  . apixaban (ELIQUIS) 5  MG TABS tablet Take 1 tablet (5 mg total) by mouth 2 (two) times daily. 60 tablet 3  . atorvastatin (LIPITOR) 40 MG tablet TAKE 1 TABLET BY MOUTH EVERY DAY AT 6PM (Patient taking differently: Take 40 mg by mouth daily.) 90 tablet 2  . carvedilol (COREG) 25 MG tablet TAKE 1 TABLET BY MOUTH TWICE A DAY WITH MEALS (Patient taking differently: Take 25 mg by mouth 2 (two) times daily with a meal.) 180 tablet 2  . Cholecalciferol (VITAMIN D3) 5000 units TABS 5,000 IU OTC vitamin D3 daily. (Patient taking differently: Take 5,000 Units by mouth daily.) 90 tablet 3  . clotrimazole-betamethasone (LOTRISONE) cream Apply 1 application topically 2 (two) times daily. (Patient taking differently: Apply 1 application topically 2 (two) times daily as needed (rash).) 45 g 0  . FLUoxetine (PROZAC) 10 MG capsule Take 1 capsule (10 mg total) by mouth daily. **PLEASE SCHEDULE A FOLLOW UP APPT FOR FUTURE MED REFILLS** (Patient taking differently: Take 10 mg by mouth daily.) 90 capsule 0  . losartan (COZAAR) 25 MG tablet TAKE 1 TABLET BY MOUTH EVERY DAY (Patient taking differently: Take 25 mg by mouth daily.) 90 tablet 2  . Magnesium 250 MG TABS Take 250 mg by mouth daily.    . Multiple Vitamins-Minerals (CENTRUM ADULTS) TABS Take 1 tablet by mouth daily.    . sildenafil (REVATIO) 20 MG tablet Take 20-100 mg by mouth daily as needed.    . tamsulosin (FLOMAX) 0.4 MG CAPS capsule Take 0.4 mg by mouth daily.    . vitamin B-12 (CYANOCOBALAMIN) 1000 MCG tablet Take 1,000 mcg by mouth daily.    . Zinc 50 MG TABS Take 50 mg by mouth every other day.    . fexofenadine (ALLEGRA) 180 MG tablet Take 1 tablet (180 mg total) by mouth daily. During allergy season 180 tablet 0  . fluticasone (FLONASE) 50 MCG/ACT nasal spray USE 1 SPRAY IN EACH NOSTRIL AFTER SINUS RINSES TWICE DAILY. (Patient taking differently: Place 2 sprays into both nostrils 2 (two) times daily as needed for allergies.) 48 mL 1  . carbamide peroxide (DEBROX) 6.5 % OTIC  solution Place 5 drops into both ears 2 (two) times daily. Gently lavage ear canals with warm water after application of ear drops. (Patient not taking: Reported on 08/21/2020) 15 mL 1   No facility-administered medications prior to visit.    No Known Allergies  ROS Review of Systems Review of Systems:  A fourteen system review of systems was performed and found to be positive as per HPI.   Objective:    Physical Exam General:  Well Developed, well nourished, appropriate for stated age, in no acute distress  Neuro:  Alert and oriented,  extra-ocular muscles intact  HEENT:  Normocephalic, atraumatic, neck supple Skin:  no gross rash, warm, pink. Cardiac:  RRR Respiratory:  ECTA B/L  and A/P, Not using accessory muscles, speaking in full sentences- unlabored. Vascular:  Ext warm, no cyanosis apprec.; cap RF less 2 sec. No pitting edema  Psych:  No HI/SI, judgement and insight good, Euthymic mood. Full Affect.   BP 133/78   Pulse 70   Temp (!) 96.8 F (36 C)   Ht 5' 8.5" (1.74 m)   Wt 160 lb 12.8 oz (72.9 kg)   SpO2 100%   BMI 24.09 kg/m  Wt Readings from Last 3 Encounters:  08/21/20 160 lb 12.8 oz (72.9 kg)  08/18/20 163 lb (73.9 kg)  08/09/20 159 lb 6.4 oz (72.3 kg)     Health Maintenance Due  Topic Date Due  . Pneumococcal Vaccine 24-63 Years old (1 of 4 - PCV13) Never done  . Zoster Vaccines- Shingrix (1 of 2) Never done  . PNA vac Low Risk Adult (1 of 2 - PCV13) Never done  . COVID-19 Vaccine (4 - Booster for Pfizer series) 03/29/2020    There are no preventive care reminders to display for this patient.  Lab Results  Component Value Date   TSH 0.96 02/08/2020   Lab Results  Component Value Date   WBC 5.5 08/09/2020   HGB 15.0 08/18/2020   HCT 44.0 08/18/2020   MCV 94.5 08/09/2020   PLT 167 08/09/2020   Lab Results  Component Value Date   NA 139 08/18/2020   K 4.3 08/18/2020   CO2 28 08/09/2020   GLUCOSE 105 (H) 08/18/2020   BUN 16 08/18/2020    CREATININE 1.00 08/18/2020   BILITOT 2.3 (H) 02/18/2019   ALKPHOS 90 02/18/2019   AST 24 02/18/2019   ALT 23 02/18/2019   PROT 7.3 02/18/2019   ALBUMIN 3.9 02/18/2019   CALCIUM 9.6 08/09/2020   ANIONGAP 4 (L) 08/09/2020   Lab Results  Component Value Date   CHOL 126 09/24/2018   Lab Results  Component Value Date   HDL 60 09/24/2018   Lab Results  Component Value Date   LDLCALC 52 09/24/2018   Lab Results  Component Value Date   TRIG 68 09/24/2018   Lab Results  Component Value Date   CHOLHDL 2.1 09/24/2018   Lab Results  Component Value Date   HGBA1C 5.5 09/24/2018      Assessment & Plan:   Problem List Items Addressed This Visit      Cardiovascular and Mediastinum   Essential hypertension - Primary (Chronic)   Persistent atrial fibrillation (HCC)     Respiratory   Seasonal allergic rhinitis   Relevant Medications   fexofenadine (ALLEGRA) 180 MG tablet   fluticasone (FLONASE) 50 MCG/ACT nasal spray     Other   Hyperlipidemia LDL goal <100   Environmental and seasonal allergies   Relevant Medications   fexofenadine (ALLEGRA) 180 MG tablet   fluticasone (FLONASE) 50 MCG/ACT nasal spray   Adjustment disorder     Adjustment disorder: -PHQ-9 score of 2, stable. -Will continue current medication regimen and closely monitor therapy, especially with new dx of atrial fibrillation.  Persistent atrial fibrillation: -Followed by Cardiology/atrial fibrillation clinic. -S/p DCCV 08/18/20. -On apixaban and carvedilol.   Essential hypertension: -Fairly controlled. -Continue current medication regimen. Will collect CMP for medication monitoring with fasting lab visit. -Will continue to monitor.  Hyperlipidemia: -Advised to schedule lab visit for fasting blood work this week/early next week, due to repeat lipid panel and hepatic function. -Continue current medication regimen. -Will continue to monitor.  Environmental and seasonal allergies, Allergic  rhinitis: -  Stable. Continue current medication regimen. Provided refills.  Meds ordered this encounter  Medications  . fexofenadine (ALLEGRA) 180 MG tablet    Sig: Take 1 tablet (180 mg total) by mouth daily. During allergy season    Dispense:  180 tablet    Refill:  0    Order Specific Question:   Supervising Provider    Answer:   Beatrice Lecher D [[id number]]  . fluticasone (FLONASE) 50 MCG/ACT nasal spray    Sig: Place 2 sprays into both nostrils 2 (two) times daily as needed for allergies.    Dispense:  16 g    Refill:  0    Order Specific Question:   Supervising Provider    Answer:   Beatrice Lecher D [[id number]]    Follow-up: Return in about 4 months (around 12/21/2020) for HTN, HLD, Mood; lab visit in 1 week for FBW.   Note:  This note was prepared with assistance of Dragon voice recognition software. Occasional wrong-word or sound-a-like substitutions may have occurred due to the inherent limitations of voice recognition software.   Lorrene Reid, PA-C

## 2020-08-23 NOTE — Progress Notes (Signed)
Primary Care Physician: Lorrene Reid, PA-C Primary Electrophysiologist: Dr Caryl Comes Referring Physician: Device clinic/Dr Lillia Dallas George [name] is a 70 y.o. male with a history of HTN, CAD, 2nd degree AV block II s/p CRT-P, ATach, NSVT, atrial fibrillation who presents for follow up in the [facility]. The patient was initially diagnosed with atrial fibrillation 07/20/20 after the device clinic received an alert for an ongoing episode. Patient has a CHADS2VASC score of 3. He denies palpitations but has noted more fatigue. There were no specific triggers that he could identify.   On follow up today, patient is s/p DCCV on 08/18/20. Patient reports that "everything feels better." He has much more energy since the DCCV. He denies any bleeding issues on anticoagulation.   Today, he denies symptoms of palpitations, chest pain, shortness of breath, orthopnea, PND, lower extremity edema, dizziness, presyncope, syncope, snoring, daytime somnolence, bleeding, or neurologic sequela. The patient is tolerating medications without difficulties and is otherwise without complaint today.    Atrial Fibrillation Risk Factors:  he does not have symptoms or diagnosis of sleep apnea. he does not have a history of rheumatic fever. he does not have a history of alcohol use. The patient does have a history of early familial atrial fibrillation or other arrhythmias. Father has afib.  he has a BMI of Body mass index is 23.82 kg/m.Marland Kitchen Filed Weights   08/24/20 1050  Weight: 72.1 kg    Family History  Problem Relation Age of Onset   Diabetes Mother    Hypertension Mother    Hyperlipidemia Mother    Heart disease Father    Hyperlipidemia Father    Hypertension Father    Arrhythmia Father        paf   Healthy Daughter    Healthy Son    Heart disease Paternal Grandfather    Heart attack Paternal Grandfather      Atrial Fibrillation Management history:  Previous antiarrhythmic  drugs: none Previous cardioversions: 08/18/20 Previous ablations: none CHADS2VASC score: 3 Anticoagulation history: Eliquis   Past Medical History:  Diagnosis Date   Arthritis THUMBS   LBBB (left bundle branch block)    bilat. BBB- PPM   Peyronie's disease    Past Surgical History:  Procedure Laterality Date   CARDIOVASCULAR STRESS TEST  10-18-2002  DR  CRENSHAW   NORMAL ADENOSINE CARDIOLITE/ EF 60%   CARDIOVERSION N/A 08/18/2020   Procedure: CARDIOVERSION;  Surgeon: Geralynn Rile, MD;  Location: [facility];  Service: Cardiovascular;  Laterality: N/A;   CATARACT EXTRACTION W/ INTRAOCULAR LENS IMPLANT  2006   LEFT EYE   EP IMPLANTABLE DEVICE N/A 10/18/2015   Procedure: BiV Pacemaker Insertion CRT-P;  Surgeon: [name], MD;  Location: [facility];  Service: Cardiovascular;  Laterality: N/A;   INGUINAL HERNIA REPAIR Right 02/23/2019   Procedure: RIGHT INGUINAL HERNIA REPAIR WITH MESH;  Surgeon: Erroll Luna, MD;  Location: [facility];  Service: General;  Laterality: Right;   LAPAROSCOPIC CHOLECYSTECTOMY  10-09-2007   LEFT HEART CATH AND CORONARY ANGIOGRAPHY N/A 01/30/2018   Procedure: LEFT HEART CATH AND CORONARY ANGIOGRAPHY;  Surgeon: Belva Crome, MD;  Location: [facility];  Service: Cardiovascular;  Laterality: N/A;   NESBIT PROCEDURE  01/17/2012   Procedure: NESBIT PROCEDURE;  Surgeon: [name], MD;  Location: [facility];  Service: Urology;  Laterality: N/A;  16 dot plication    REPAIR DETACHED RETINA, LEFT EYE  1993  REPAIR LEFT INGUINAL HERNIA  2005    Current Outpatient Medications  Medication Sig Dispense Refill   apixaban (ELIQUIS) 5 MG TABS tablet Take 1 tablet (5 mg total) by mouth 2 (two) times daily. 60 tablet 3   atorvastatin (LIPITOR) 40 MG tablet TAKE 1 TABLET BY MOUTH EVERY DAY AT 6PM 90 tablet 2   carvedilol (COREG) 25 MG tablet TAKE 1 TABLET BY MOUTH TWICE A DAY WITH MEALS 180 tablet 2    Cholecalciferol (VITAMIN D3) 5000 units TABS 5,000 IU OTC vitamin D3 daily. 90 tablet 3   clotrimazole-betamethasone (LOTRISONE) cream Apply 1 application topically 2 (two) times daily. 45 g 0   fexofenadine (ALLEGRA) 180 MG tablet Take 1 tablet (180 mg total) by mouth daily. During allergy season 180 tablet 0   FLUoxetine (PROZAC) 10 MG capsule Take 1 capsule (10 mg total) by mouth daily. **PLEASE SCHEDULE A FOLLOW UP APPT FOR FUTURE MED REFILLS** 90 capsule 0   fluticasone (FLONASE) 50 MCG/ACT nasal spray Place 2 sprays into both nostrils 2 (two) times daily as needed for allergies. 16 g 0   losartan (COZAAR) 25 MG tablet TAKE 1 TABLET BY MOUTH EVERY DAY 90 tablet 2   Magnesium 250 MG TABS Take 250 mg by mouth daily.     Multiple Vitamins-Minerals (CENTRUM ADULTS) TABS Take 1 tablet by mouth daily.     sildenafil (REVATIO) 20 MG tablet Take 20-100 mg by mouth daily as needed.     tamsulosin (FLOMAX) 0.4 MG CAPS capsule Take 0.4 mg by mouth daily.     vitamin B-12 (CYANOCOBALAMIN) 1000 MCG tablet Take 1,000 mcg by mouth daily.     Zinc 50 MG TABS Take 50 mg by mouth every other day.     No current facility-administered medications for this encounter.    No Known Allergies  Social History   Socioeconomic History   Marital status: Married    Spouse name: Not on file   Number of children: Not on file   Years of education: Not on file   Highest education level: Not on file  Occupational History   Not on file  Tobacco Use   Smoking status: Never   Smokeless tobacco: Never  Vaping Use   Vaping Use: Never used  Substance and Sexual Activity   Alcohol use: No   Drug use: No   Sexual activity: Yes  Other Topics Concern   Not on file  Social History Narrative   Right handed    Lives with wife   Social Determinants of Health   Financial Resource Strain: Not on file  Food Insecurity: Not on file  Transportation Needs: Not on file  Physical Activity: Not on file  Stress: Not on  file  Social Connections: Not on file  Intimate Partner Violence: Not on file     ROS- All systems are reviewed and negative except as per the HPI above.  Physical Exam: Vitals:   08/24/20 1050  BP: 120/72  Pulse: (!) 58  Weight: 72.1 kg  Height: 5' 8.5" (1.74 m)    GEN- The patient is a well appearing male, alert and oriented x 3 today.   HEENT-head normocephalic, atraumatic, sclera clear, conjunctiva pink, hearing intact, trachea midline. Lungs- Clear to ausculation bilaterally, normal work of breathing Heart- Regular rate and rhythm, no murmurs, rubs or gallops  GI- soft, NT, ND, + BS Extremities- no clubbing, cyanosis, or edema MS- no significant deformity or atrophy Skin- no rash or lesion Psych- euthymic  mood, full affect Neuro- strength and sensation are intact   Wt Readings from Last 3 Encounters:  08/24/20 72.1 kg  08/21/20 72.9 kg  08/18/20 73.9 kg    EKG today demonstrates  A sense V paced rhythm Vent. rate 58 BPM PR interval 102 ms QRS duration 144 ms QT/QTcB 462/453 ms  Echo 06/05/20 demonstrated  1. Inferior wall hypokinesis . Left ventricular ejection fraction, by  estimation, is 50 to 55%. The left ventricle has low normal function. The  left ventricle has no regional wall motion abnormalities. The left  ventricular internal cavity size was  mildly dilated. There is mild asymmetric left ventricular hypertrophy of  the basal and septal segments. Left ventricular diastolic parameters are  indeterminate.   2. Pacing wire in RA/RV . Right ventricular systolic function is normal.  The right ventricular size is normal.   3. Left atrial size was mildly dilated.   4. Right atrial size was mildly dilated.   5. The mitral valve is abnormal. Mild mitral valve regurgitation. No  evidence of mitral stenosis.   6. The aortic valve is abnormal. Aortic valve regurgitation is mild. No  aortic stenosis is present.   7. The inferior vena cava is normal in size  with greater than 50%  respiratory variability, suggesting right atrial pressure of 3 mmHg.   Epic records are reviewed at length today  CHA2DS2-VASc Score = 3  The patient's score is based upon: CHF History: No HTN History: Yes Diabetes History: No Stroke History: No Vascular Disease History: Yes Age Score: 1 Gender Score: 0      ASSESSMENT AND PLAN: 1. Persistent atrial fibrillation  The patient's CHA2DS2-VASc score is 3, indicating a 3.2% annual risk of stroke.   S/p DCCV 08/18/20 Patient appears to be maintaining SR. Continue Eliquis 5 mg BID Continue Coreg 25 mg BID  2. Secondary Hypercoagulable State (ICD10:  D68.69) The patient is at significant risk for stroke/thromboembolism based upon his CHA2DS2-VASc Score of 3.  Continue Apixaban (Eliquis).   3. 2nd degree AV block type II S/p PPM, followed by Dr Caryl Comes and the device clinic.  4. CAD Nonobstructive on LHC 2019 No anginal symptoms.   Follow up in the AF clinic in 6 months.    [facility] [address] [place], [place] [address] [phone] 08/24/2020 11:04 AM

## 2020-08-24 ENCOUNTER — Ambulatory Visit (HOSPITAL_COMMUNITY)
Admission: RE | Admit: 2020-08-24 | Discharge: 2020-08-24 | Disposition: A | Discharge status: Home / Self Care | Payer: Medicare HMO | Source: Ambulatory Visit | Attending: Physician Assistant | Admitting: Physician Assistant

## 2020-08-24 ENCOUNTER — Encounter (HOSPITAL_COMMUNITY): Payer: Self-pay | Admitting: Physician Assistant

## 2020-08-24 ENCOUNTER — Other Ambulatory Visit: Payer: Self-pay

## 2020-08-24 VITALS — BP 120/72 | HR 58 | Ht 68.5 in | Wt 159.0 lb

## 2020-08-24 DIAGNOSIS — I441 Atrioventricular block, second degree: Secondary | ICD-10-CM | POA: Diagnosis not present

## 2020-08-24 DIAGNOSIS — I447 Left bundle-branch block, unspecified: Secondary | ICD-10-CM | POA: Diagnosis not present

## 2020-08-24 DIAGNOSIS — I2510 Atherosclerotic heart disease of native coronary artery without angina pectoris: Secondary | ICD-10-CM | POA: Diagnosis not present

## 2020-08-24 DIAGNOSIS — Z8249 Family history of ischemic heart disease and other diseases of the circulatory system: Secondary | ICD-10-CM | POA: Insufficient documentation

## 2020-08-24 DIAGNOSIS — Z79899 Other long term (current) drug therapy: Secondary | ICD-10-CM | POA: Diagnosis not present

## 2020-08-24 DIAGNOSIS — Z7901 Long term (current) use of anticoagulants: Secondary | ICD-10-CM | POA: Insufficient documentation

## 2020-08-24 DIAGNOSIS — I4819 Other persistent atrial fibrillation: Secondary | ICD-10-CM | POA: Insufficient documentation

## 2020-08-24 DIAGNOSIS — D6869 Other thrombophilia: Secondary | ICD-10-CM | POA: Insufficient documentation

## 2020-10-25 ENCOUNTER — Ambulatory Visit (INDEPENDENT_AMBULATORY_CARE_PROVIDER_SITE_OTHER): Payer: Medicare HMO

## 2020-10-25 DIAGNOSIS — I4819 Other persistent atrial fibrillation: Secondary | ICD-10-CM | POA: Diagnosis not present

## 2020-10-25 LAB — CUP PACEART REMOTE DEVICE CHECK
Battery Remaining Longevity: 44 mo
Battery Voltage: 2.94 V
Brady Statistic RA Percent Paced: 4.02 %
Brady Statistic RV Percent Paced: 99.47 %
Date Time Interrogation Session: 20220809212340
Implantable Lead Implant Date: 20170802
Implantable Lead Implant Date: 20170802
Implantable Lead Implant Date: 20170802
Implantable Lead Location: 753858
Implantable Lead Location: 753859
Implantable Lead Location: 753860
Implantable Lead Model: 5076
Implantable Lead Model: 5076
Implantable Pulse Generator Implant Date: 20170802
Lead Channel Impedance Value: 1007 Ohm
Lead Channel Impedance Value: 1026 Ohm
Lead Channel Impedance Value: 285 Ohm
Lead Channel Impedance Value: 361 Ohm
Lead Channel Impedance Value: 418 Ohm
Lead Channel Impedance Value: 437 Ohm
Lead Channel Impedance Value: 475 Ohm
Lead Channel Impedance Value: 494 Ohm
Lead Channel Impedance Value: 494 Ohm
Lead Channel Impedance Value: 627 Ohm
Lead Channel Impedance Value: 817 Ohm
Lead Channel Impedance Value: 855 Ohm
Lead Channel Impedance Value: 874 Ohm
Lead Channel Impedance Value: 950 Ohm
Lead Channel Pacing Threshold Amplitude: 0.75 V
Lead Channel Pacing Threshold Amplitude: 1.5 V
Lead Channel Pacing Threshold Amplitude: 1.75 V
Lead Channel Pacing Threshold Pulse Width: 0.4 ms
Lead Channel Pacing Threshold Pulse Width: 0.4 ms
Lead Channel Pacing Threshold Pulse Width: 0.8 ms
Lead Channel Sensing Intrinsic Amplitude: 0.5 mV
Lead Channel Sensing Intrinsic Amplitude: 0.5 mV
Lead Channel Sensing Intrinsic Amplitude: 7.5 mV
Lead Channel Sensing Intrinsic Amplitude: 7.5 mV
Lead Channel Setting Pacing Amplitude: 1.5 V
Lead Channel Setting Pacing Amplitude: 2.25 V
Lead Channel Setting Pacing Amplitude: 3 V
Lead Channel Setting Pacing Pulse Width: 0.4 ms
Lead Channel Setting Pacing Pulse Width: 0.8 ms
Lead Channel Setting Sensing Sensitivity: 2.8 mV

## 2020-11-02 NOTE — H&P (View-Only) (Signed)
Electrophysiology Office Note Date: 11/03/2020  ID:  George [name], DOB Feb 25, 1951, MRN [id number]  PCP: Lorrene Reid, PA-C Primary Cardiologist: Virl Axe, MD Electrophysiologist: Virl Axe, MD   CC: Pacemaker follow-up  George [name] is a 70 y.o. male seen today for Virl Axe, MD for routine electrophysiology followup.  Since last being seen in our clinic the patient reports 2 weeks of fatigue and decreased exercise tolerance. Mild lightheadedness with rapid or prolonged standing. No chest pain. No palpitations or PND. Has noticed mild edema.   Device History: MDT CRT-P, implanted 10/18/15, Dr. Caryl Comes  Past Medical History:  Diagnosis Date   Arthritis THUMBS   LBBB (left bundle branch block)    bilat. BBB- PPM   Peyronie's disease    Past Surgical History:  Procedure Laterality Date   CARDIOVASCULAR STRESS TEST  10-18-2002  DR  CRENSHAW   NORMAL ADENOSINE CARDIOLITE/ EF 60%   CARDIOVERSION N/A 08/18/2020   Procedure: CARDIOVERSION;  Surgeon: Geralynn Rile, MD;  Location: [place];  Service: Cardiovascular;  Laterality: N/A;   CATARACT EXTRACTION W/ INTRAOCULAR LENS IMPLANT  2006   LEFT EYE   EP IMPLANTABLE DEVICE N/A 10/18/2015   Procedure: BiV Pacemaker Insertion CRT-P;  Surgeon: [name], MD;  Location: [place] [facility];  Service: Cardiovascular;  Laterality: N/A;   INGUINAL HERNIA REPAIR Right 02/23/2019   Procedure: RIGHT INGUINAL HERNIA REPAIR WITH MESH;  Surgeon: Erroll Luna, MD;  Location: [place];  Service: General;  Laterality: Right;   LAPAROSCOPIC CHOLECYSTECTOMY  10-09-2007   LEFT HEART CATH AND CORONARY ANGIOGRAPHY N/A 01/30/2018   Procedure: LEFT HEART CATH AND CORONARY ANGIOGRAPHY;  Surgeon: Belva Crome, MD;  Location: [place] [facility];  Service: Cardiovascular;  Laterality: N/A;   NESBIT PROCEDURE  01/17/2012   Procedure: NESBIT PROCEDURE;  Surgeon: [name], MD;  Location: [facility];  Service: Urology;  Laterality: N/A;  16 dot plication    REPAIR DETACHED RETINA, LEFT EYE  1993   REPAIR LEFT INGUINAL HERNIA  2005    Current Outpatient Medications  Medication Sig Dispense Refill   apixaban (ELIQUIS) 5 MG TABS tablet Take 1 tablet (5 mg total) by mouth 2 (two) times daily. 60 tablet 3   atorvastatin (LIPITOR) 40 MG tablet TAKE 1 TABLET BY MOUTH EVERY DAY AT 6PM 90 tablet 2   carvedilol (COREG) 25 MG tablet TAKE 1 TABLET BY MOUTH TWICE A DAY WITH MEALS 180 tablet 2   Cholecalciferol (VITAMIN D3) 5000 units TABS 5,000 IU OTC vitamin D3 daily. 90 tablet 3   clotrimazole-betamethasone (LOTRISONE) cream Apply 1 application topically 2 (two) times daily. 45 g 0   fexofenadine (ALLEGRA) 180 MG tablet Take 1 tablet (180 mg total) by mouth daily. During allergy season 180 tablet 0   FLUoxetine (PROZAC) 10 MG capsule Take 1 capsule (10 mg total) by mouth daily. **PLEASE SCHEDULE A FOLLOW UP APPT FOR FUTURE MED REFILLS** 90 capsule 0   fluticasone (FLONASE) 50 MCG/ACT nasal spray Place 2 sprays into both nostrils 2 (two) times daily as needed for allergies. 16 g 0   furosemide (LASIX) 20 MG tablet Take 1 tablet (20 mg total) by mouth daily. 90 tablet 3   Magnesium 250 MG TABS Take 250 mg by mouth daily.     Multiple Vitamins-Minerals (CENTRUM ADULTS) TABS Take 1 tablet by mouth daily.     sildenafil (REVATIO) 20 MG tablet Take 20-100 mg by mouth daily  as needed.     tamsulosin (FLOMAX) 0.4 MG CAPS capsule Take 0.4 mg by mouth daily.     vitamin B-12 (CYANOCOBALAMIN) 1000 MCG tablet Take 1,000 mcg by mouth daily.     Zinc 50 MG TABS Take 50 mg by mouth every other day.     losartan (COZAAR) 25 MG tablet Take 0.5 tablets (12.5 mg total) by mouth daily. 45 tablet 3   No current facility-administered medications for this visit.    Allergies:   Patient has no known allergies.   Social History: Social History   Socioeconomic History   Marital status: Married    Spouse  name: Not on file   Number of children: Not on file   Years of education: Not on file   Highest education level: Not on file  Occupational History   Not on file  Tobacco Use   Smoking status: Never   Smokeless tobacco: Never  Vaping Use   Vaping Use: Never used  Substance and Sexual Activity   Alcohol use: No   Drug use: No   Sexual activity: Yes  Other Topics Concern   Not on file  Social History Narrative   Right handed    Lives with wife   Social Determinants of Health   Financial Resource Strain: Not on file  Food Insecurity: Not on file  Transportation Needs: Not on file  Physical Activity: Not on file  Stress: Not on file  Social Connections: Not on file  Intimate Partner Violence: Not on file    Family History: Family History  Problem Relation Age of Onset   Diabetes Mother    Hypertension Mother    Hyperlipidemia Mother    Heart disease Father    Hyperlipidemia Father    Hypertension Father    Arrhythmia Father        paf   Healthy Daughter    Healthy Son    Heart disease Paternal Grandfather    Heart attack Paternal Grandfather      Review of Systems: All other systems reviewed and are otherwise negative except as noted above.  Physical Exam: Vitals:   11/03/20 0756  BP: 130/80  Pulse: 80  SpO2: 98%  Weight: 161 lb 6.4 oz (73.2 kg)  Height: '5\' 8"'$  (1.727 m)     GEN- The patient is well appearing, alert and oriented x 3 today.   HEENT: normocephalic, atraumatic; sclera clear, conjunctiva pink; hearing intact; oropharynx clear; neck supple  Lungs- Clear to ausculation bilaterally, normal work of breathing.  No wheezes, rales, rhonchi Heart- Regular rate and rhythm, no murmurs, rubs or gallops  GI- soft, non-tender, non-distended, bowel sounds present  Extremities- no clubbing or cyanosis. No edema MS- no significant deformity or atrophy Skin- warm and dry, no rash or lesion; PPM pocket well healed Psych- euthymic mood, full affect Neuro-  strength and sensation are intact  PPM Interrogation- reviewed in detail today,  See PACEART report  EKG:  EKG is not ordered today. Personal review of ekg ordered  08/24/20  shows AS-VP at 58 bpm, QRS 144 in RBBB, negative lead I.    Recent Labs: 02/08/2020: TSH 0.96 08/09/2020: Platelets 167 08/18/2020: BUN 16; Creatinine, Ser 1.00; Hemoglobin 15.0; Potassium 4.3; Sodium 139   Wt Readings from Last 3 Encounters:  11/03/20 161 lb 6.4 oz (73.2 kg)  08/24/20 159 lb (72.1 kg)  08/21/20 160 lb 12.8 oz (72.9 kg)     Other studies Reviewed: Additional studies/ records that were reviewed today  include: Previous EP office notes, Previous remote checks, Most recent labwork.   Assessment and Plan:  1.  Heart Block  s/p Medtronic PPM  Normal PPM function See Pace Art report No changes today  2. Mild CM Echo 05/2020 with back to LVEF 50-55% with GDMT >98% effective BP Volume status only mildly elevated Continue BB and ARB  3. Persistent atrial fibrillation Pt has recurrent symptomatic atrial fibrillation Continue eliquis for CHA2DS2-VASc of at least 3. Continue coreg 25 mg BID. Discussed management of AF including tikosyn, amiodarone, and ablation including risks, benefits, and toxicities. He would like to be considered for tikosyn therapy.  With symptomatic AF, we will plan [facility] in the interim, and plan AF clinic follow up after to discuss and plan. Decrease losartan with reports of intermittent hyPOtension at home and addition of prn lasix. Add lasix 20 mg to take as needed for orthopnea, edema, or DOE. Encouraged daily weights and fluid restriction to <2000 mL daily.   4. CAD NOD by [facility] 2019 Denies s/s ischemia  Current medicines are reviewed at length with the patient today.   The patient does not have concerns regarding his medicines.  The following changes were made today:  Lasix prn added, Losartan decreased.  Labs/ tests ordered today include:  Orders Placed This Encounter   Procedures   Basic metabolic panel   CBC   Disposition:   Follow up with  AF clinic for tikosyn planning.     Jacalyn Lefevre, PA-C  11/03/2020 8:47 AM  [facility] [address] [place] [address] [phone] (office) [phone] (fax)

## 2020-11-02 NOTE — Progress Notes (Signed)
Electrophysiology Office Note Date: 11/03/2020  ID:  George [name], DOB 09-21-1950, MRN [id number]  PCP: Lorrene Reid, PA-C Primary Cardiologist: Virl Axe, MD Electrophysiologist: Virl Axe, MD   CC: Pacemaker follow-up  George [name] is a 70 y.o. male seen today for Virl Axe, MD for routine electrophysiology followup.  Since last being seen in our clinic the patient reports 2 weeks of fatigue and decreased exercise tolerance. Mild lightheadedness with rapid or prolonged standing. No chest pain. No palpitations or PND. Has noticed mild edema.   Device History: MDT CRT-P, implanted 10/18/15, Dr. Caryl Comes  Past Medical History:  Diagnosis Date   Arthritis THUMBS   LBBB (left bundle branch block)    bilat. BBB- PPM   Peyronie's disease    Past Surgical History:  Procedure Laterality Date   CARDIOVASCULAR STRESS TEST  10-18-2002  DR  CRENSHAW   NORMAL ADENOSINE CARDIOLITE/ EF 60%   CARDIOVERSION N/A 08/18/2020   Procedure: CARDIOVERSION;  Surgeon: Geralynn Rile, MD;  Location: [place];  Service: Cardiovascular;  Laterality: N/A;   CATARACT EXTRACTION W/ INTRAOCULAR LENS IMPLANT  2006   LEFT EYE   EP IMPLANTABLE DEVICE N/A 10/18/2015   Procedure: BiV Pacemaker Insertion CRT-P;  Surgeon: [name], MD;  Location: [place] [facility];  Service: Cardiovascular;  Laterality: N/A;   INGUINAL HERNIA REPAIR Right 02/23/2019   Procedure: RIGHT INGUINAL HERNIA REPAIR WITH MESH;  Surgeon: Erroll Luna, MD;  Location: [place];  Service: General;  Laterality: Right;   LAPAROSCOPIC CHOLECYSTECTOMY  10-09-2007   LEFT HEART CATH AND CORONARY ANGIOGRAPHY N/A 01/30/2018   Procedure: LEFT HEART CATH AND CORONARY ANGIOGRAPHY;  Surgeon: Belva Crome, MD;  Location: [place] [facility];  Service: Cardiovascular;  Laterality: N/A;   NESBIT PROCEDURE  01/17/2012   Procedure: NESBIT PROCEDURE;  Surgeon: [name], MD;  Location: [facility];  Service: Urology;  Laterality: N/A;  16 dot plication    REPAIR DETACHED RETINA, LEFT EYE  1993   REPAIR LEFT INGUINAL HERNIA  2005    Current Outpatient Medications  Medication Sig Dispense Refill   apixaban (ELIQUIS) 5 MG TABS tablet Take 1 tablet (5 mg total) by mouth 2 (two) times daily. 60 tablet 3   atorvastatin (LIPITOR) 40 MG tablet TAKE 1 TABLET BY MOUTH EVERY DAY AT 6PM 90 tablet 2   carvedilol (COREG) 25 MG tablet TAKE 1 TABLET BY MOUTH TWICE A DAY WITH MEALS 180 tablet 2   Cholecalciferol (VITAMIN D3) 5000 units TABS 5,000 IU OTC vitamin D3 daily. 90 tablet 3   clotrimazole-betamethasone (LOTRISONE) cream Apply 1 application topically 2 (two) times daily. 45 g 0   fexofenadine (ALLEGRA) 180 MG tablet Take 1 tablet (180 mg total) by mouth daily. During allergy season 180 tablet 0   FLUoxetine (PROZAC) 10 MG capsule Take 1 capsule (10 mg total) by mouth daily. **PLEASE SCHEDULE A FOLLOW UP APPT FOR FUTURE MED REFILLS** 90 capsule 0   fluticasone (FLONASE) 50 MCG/ACT nasal spray Place 2 sprays into both nostrils 2 (two) times daily as needed for allergies. 16 g 0   furosemide (LASIX) 20 MG tablet Take 1 tablet (20 mg total) by mouth daily. 90 tablet 3   Magnesium 250 MG TABS Take 250 mg by mouth daily.     Multiple Vitamins-Minerals (CENTRUM ADULTS) TABS Take 1 tablet by mouth daily.     sildenafil (REVATIO) 20 MG tablet Take 20-100 mg by mouth daily  as needed.     tamsulosin (FLOMAX) 0.4 MG CAPS capsule Take 0.4 mg by mouth daily.     vitamin B-12 (CYANOCOBALAMIN) 1000 MCG tablet Take 1,000 mcg by mouth daily.     Zinc 50 MG TABS Take 50 mg by mouth every other day.     losartan (COZAAR) 25 MG tablet Take 0.5 tablets (12.5 mg total) by mouth daily. 45 tablet 3   No current facility-administered medications for this visit.    Allergies:   Patient has no known allergies.   Social History: Social History   Socioeconomic History   Marital status: Married    Spouse  name: Not on file   Number of children: Not on file   Years of education: Not on file   Highest education level: Not on file  Occupational History   Not on file  Tobacco Use   Smoking status: Never   Smokeless tobacco: Never  Vaping Use   Vaping Use: Never used  Substance and Sexual Activity   Alcohol use: No   Drug use: No   Sexual activity: Yes  Other Topics Concern   Not on file  Social History Narrative   Right handed    Lives with wife   Social Determinants of Health   Financial Resource Strain: Not on file  Food Insecurity: Not on file  Transportation Needs: Not on file  Physical Activity: Not on file  Stress: Not on file  Social Connections: Not on file  Intimate Partner Violence: Not on file    Family History: Family History  Problem Relation Age of Onset   Diabetes Mother    Hypertension Mother    Hyperlipidemia Mother    Heart disease Father    Hyperlipidemia Father    Hypertension Father    Arrhythmia Father        paf   Healthy Daughter    Healthy Son    Heart disease Paternal Grandfather    Heart attack Paternal Grandfather      Review of Systems: All other systems reviewed and are otherwise negative except as noted above.  Physical Exam: Vitals:   11/03/20 0756  BP: 130/80  Pulse: 80  SpO2: 98%  Weight: 161 lb 6.4 oz (73.2 kg)  Height: '5\' 8"'$  (1.727 m)     GEN- The patient is well appearing, alert and oriented x 3 today.   HEENT: normocephalic, atraumatic; sclera clear, conjunctiva pink; hearing intact; oropharynx clear; neck supple  Lungs- Clear to ausculation bilaterally, normal work of breathing.  No wheezes, rales, rhonchi Heart- Regular rate and rhythm, no murmurs, rubs or gallops  GI- soft, non-tender, non-distended, bowel sounds present  Extremities- no clubbing or cyanosis. No edema MS- no significant deformity or atrophy Skin- warm and dry, no rash or lesion; PPM pocket well healed Psych- euthymic mood, full affect Neuro-  strength and sensation are intact  PPM Interrogation- reviewed in detail today,  See PACEART report  EKG:  EKG is not ordered today. Personal review of ekg ordered  08/24/20  shows AS-VP at 58 bpm, QRS 144 in RBBB, negative lead I.    Recent Labs: 02/08/2020: TSH 0.96 08/09/2020: Platelets 167 08/18/2020: BUN 16; Creatinine, Ser 1.00; Hemoglobin 15.0; Potassium 4.3; Sodium 139   Wt Readings from Last 3 Encounters:  11/03/20 161 lb 6.4 oz (73.2 kg)  08/24/20 159 lb (72.1 kg)  08/21/20 160 lb 12.8 oz (72.9 kg)     Other studies Reviewed: Additional studies/ records that were reviewed today  include: Previous EP office notes, Previous remote checks, Most recent labwork.   Assessment and Plan:  1.  Heart Block  s/p Medtronic PPM  Normal PPM function See Pace Art report No changes today  2. Mild CM Echo 05/2020 with back to LVEF 50-55% with GDMT >98% effective BP Volume status only mildly elevated Continue BB and ARB  3. Persistent atrial fibrillation Pt has recurrent symptomatic atrial fibrillation Continue eliquis for CHA2DS2-VASc of at least 3. Continue coreg 25 mg BID. Discussed management of AF including tikosyn, amiodarone, and ablation including risks, benefits, and toxicities. He would like to be considered for tikosyn therapy.  With symptomatic AF, we will plan [facility] in the interim, and plan AF clinic follow up after to discuss and plan. Decrease losartan with reports of intermittent hyPOtension at home and addition of prn lasix. Add lasix 20 mg to take as needed for orthopnea, edema, or DOE. Encouraged daily weights and fluid restriction to <2000 mL daily.   4. CAD NOD by [facility] 2019 Denies s/s ischemia  Current medicines are reviewed at length with the patient today.   The patient does not have concerns regarding his medicines.  The following changes were made today:  Lasix prn added, Losartan decreased.  Labs/ tests ordered today include:  Orders Placed This Encounter   Procedures   Basic metabolic panel   CBC   Disposition:   Follow up with  AF clinic for tikosyn planning.     Jacalyn Lefevre, PA-C  11/03/2020 8:47 AM  [facility] [address] [place] [address] [phone] (office) [phone] (fax)

## 2020-11-03 ENCOUNTER — Other Ambulatory Visit (HOSPITAL_COMMUNITY): Payer: Self-pay | Admitting: Physician Assistant

## 2020-11-03 ENCOUNTER — Encounter: Payer: Self-pay | Admitting: Student

## 2020-11-03 ENCOUNTER — Ambulatory Visit: Payer: Medicare HMO | Admitting: Student

## 2020-11-03 ENCOUNTER — Other Ambulatory Visit: Payer: Self-pay | Admitting: Physician Assistant

## 2020-11-03 ENCOUNTER — Other Ambulatory Visit: Payer: Self-pay

## 2020-11-03 VITALS — BP 130/80 | HR 80 | Ht 68.0 in | Wt 161.4 lb

## 2020-11-03 DIAGNOSIS — Z950 Presence of cardiac pacemaker: Secondary | ICD-10-CM

## 2020-11-03 DIAGNOSIS — I428 Other cardiomyopathies: Secondary | ICD-10-CM

## 2020-11-03 DIAGNOSIS — I442 Atrioventricular block, complete: Secondary | ICD-10-CM | POA: Diagnosis not present

## 2020-11-03 DIAGNOSIS — J302 Other seasonal allergic rhinitis: Secondary | ICD-10-CM

## 2020-11-03 DIAGNOSIS — J3089 Other allergic rhinitis: Secondary | ICD-10-CM

## 2020-11-03 LAB — CUP PACEART INCLINIC DEVICE CHECK
Battery Remaining Longevity: 44 mo
Battery Voltage: 2.94 V
Brady Statistic AP VP Percent: 39.51 %
Brady Statistic AP VS Percent: 0.05 %
Brady Statistic AS VP Percent: 60.02 %
Brady Statistic AS VS Percent: 0.27 %
Brady Statistic RA Percent Paced: 21.46 %
Brady Statistic RV Percent Paced: 99.59 %
Date Time Interrogation Session: 20220819084038
Implantable Lead Implant Date: 20170802
Implantable Lead Implant Date: 20170802
Implantable Lead Implant Date: 20170802
Implantable Lead Location: 753858
Implantable Lead Location: 753859
Implantable Lead Location: 753860
Implantable Lead Model: 5076
Implantable Lead Model: 5076
Implantable Pulse Generator Implant Date: 20170802
Lead Channel Impedance Value: 1007 Ohm
Lead Channel Impedance Value: 1007 Ohm
Lead Channel Impedance Value: 304 Ohm
Lead Channel Impedance Value: 380 Ohm
Lead Channel Impedance Value: 437 Ohm
Lead Channel Impedance Value: 456 Ohm
Lead Channel Impedance Value: 475 Ohm
Lead Channel Impedance Value: 494 Ohm
Lead Channel Impedance Value: 532 Ohm
Lead Channel Impedance Value: 627 Ohm
Lead Channel Impedance Value: 798 Ohm
Lead Channel Impedance Value: 817 Ohm
Lead Channel Impedance Value: 836 Ohm
Lead Channel Impedance Value: 950 Ohm
Lead Channel Pacing Threshold Amplitude: 0.75 V
Lead Channel Pacing Threshold Amplitude: 1.375 V
Lead Channel Pacing Threshold Amplitude: 1.75 V
Lead Channel Pacing Threshold Pulse Width: 0.4 ms
Lead Channel Pacing Threshold Pulse Width: 0.4 ms
Lead Channel Pacing Threshold Pulse Width: 0.8 ms
Lead Channel Sensing Intrinsic Amplitude: 0.625 mV
Lead Channel Sensing Intrinsic Amplitude: 0.625 mV
Lead Channel Sensing Intrinsic Amplitude: 7.5 mV
Lead Channel Sensing Intrinsic Amplitude: 7.5 mV
Lead Channel Setting Pacing Amplitude: 1.5 V
Lead Channel Setting Pacing Amplitude: 2.5 V
Lead Channel Setting Pacing Amplitude: 3 V
Lead Channel Setting Pacing Pulse Width: 0.4 ms
Lead Channel Setting Pacing Pulse Width: 0.8 ms
Lead Channel Setting Sensing Sensitivity: 2.8 mV

## 2020-11-03 MED ORDER — FUROSEMIDE 20 MG PO TABS: 20.0000 mg | ORAL_TABLET | Freq: Every day | ORAL | 3 refills | Status: DC

## 2020-11-03 MED ORDER — LOSARTAN POTASSIUM 25 MG PO TABS: 12.5000 mg | ORAL_TABLET | Freq: Every day | ORAL | 3 refills | Status: DC

## 2020-11-03 NOTE — Patient Instructions (Addendum)
Medication Instructions:  Your physician has recommended you make the following change in your medication:   START: Furosemide (Lasix) '20mg'$  as needed for swelling DECREASE: Losartan to 12.'5mg'$  daily  *If you need a refill on your cardiac medications before your next appointment, please call your pharmacy*   Follow-Up: At [facility], you and your health needs are our priority.  As part of our continuing mission to provide you with exceptional heart care, we have created designated Provider Care Teams.  These Care Teams include your primary Cardiologist (physician) and Advanced Practice Providers (APPs -  Physician Assistants and Nurse Practitioners) who all work together to provide you with the care you need, when you need it.  Your next appointment:   1 week(s) after [place] on 8/31  The format for your next appointment:   In Person  Provider:   You will follow up in the [facility] located at [facility] Your provider will be: Roderic Palau, NP ------------------------------------------------------------------------------------------------------------------ AFIB CLINIC INFORMATION: Your appointment is scheduled on: 11/22/2020 at 8:20am. Please arrive 15 minutes early for check-in. The AFib Clinic is located in the Heart and Vascular Specialty Clinics at [facility]. Parking instructions/directions: [address] (off [address]). When you pull in to Entrance C, there is an underground parking garage to your right. The code to enter the garage is 4455. Take the elevators to the first floor. Follow the signs to the Heart and Vascular Specialty Clinics. You will see registration at the end of the hallway.  Phone number: [phone]  Other Instructions  You are scheduled for a Cardioversion on 11/15/2020 with Dr. Radford Pax.  Please arrive at the [facility] (Main Entrance A) at [facility]: [address] [place], [place] [address] at 8:00am.    DIET: Nothing to eat or drink after midnight except a sip of water with medications   FYI: For your safety, and to allow [place] to monitor your vital signs accurately during the surgery/procedure we request that   if you have artificial nails, gel coating, SNS etc. Please have those removed prior to your surgery/procedure. Not having the nail coverings /polish removed may result in cancellation or delay of your surgery/procedure.   Medication Instructions: Continue your anticoagulant: Eliquis You will need to continue your anticoagulant after your procedure until you  are told by your  Provider that it is safe to stop   Labs: BMET, CBC will be done at the hospital the morning of your procedure.  You must have a responsible person to drive you home and stay in the waiting area during your procedure. Failure to do so could result in cancellation.  Bring your insurance cards.  *Special Note: Every effort is made to have your procedure done on time. Occasionally there are emergencies that occur at the hospital that may cause delays. Please be patient if a delay does occur.

## 2020-11-06 ENCOUNTER — Telehealth: Payer: Self-pay

## 2020-11-06 NOTE — Telephone Encounter (Signed)
Called patient and he was seen by Jonni Sanger on 11/03/20.

## 2020-11-06 NOTE — Telephone Encounter (Signed)
-----   Message from [name], MD sent at 11/04/2020  3:53 PM EDT ----- Remote reviewed. This remote is abnormal for persistent afib, has seen AT PA to discuss Anti-arrhythmic drugs option

## 2020-11-07 ENCOUNTER — Other Ambulatory Visit: Payer: Self-pay | Admitting: Physician Assistant

## 2020-11-09 ENCOUNTER — Telehealth: Payer: Self-pay | Admitting: Physician Assistant

## 2020-11-09 NOTE — Telephone Encounter (Signed)
Patient goes in to have his heart shocked next Wednesday and patient needs a referral for a sleep study after that. Please advise, patient may not have cell service today.

## 2020-11-14 ENCOUNTER — Encounter: Payer: Self-pay | Admitting: Physician Assistant

## 2020-11-14 ENCOUNTER — Other Ambulatory Visit: Payer: Self-pay

## 2020-11-14 ENCOUNTER — Ambulatory Visit (INDEPENDENT_AMBULATORY_CARE_PROVIDER_SITE_OTHER): Payer: Medicare HMO | Admitting: Physician Assistant

## 2020-11-14 VITALS — BP 109/71 | HR 60 | Temp 98.3°F | Ht 68.0 in | Wt 156.2 lb

## 2020-11-14 DIAGNOSIS — I4819 Other persistent atrial fibrillation: Secondary | ICD-10-CM

## 2020-11-14 DIAGNOSIS — R0683 Snoring: Secondary | ICD-10-CM | POA: Diagnosis not present

## 2020-11-14 DIAGNOSIS — R0681 Apnea, not elsewhere classified: Secondary | ICD-10-CM | POA: Diagnosis not present

## 2020-11-14 NOTE — Progress Notes (Signed)
Established Patient Office Visit  Subjective:  Patient ID: George [name], male    DOB: November 02, 1950  Age: 70 y.o. MRN: [id number]  CC:  Chief Complaint  Patient presents with   Sleep Apnea    HPI George [name] presents to discuss sleep study. Patent reports his wife has noticed worsening snoring and  episodes where he stops breathing and wakes up gasping for air. Patient has a family history of sleep apnea (mother). Patient does state feeling tired but also has chronic a-fib for which he is scheduled for DCCV tomorrow .  Past Medical History:  Diagnosis Date   Arthritis THUMBS   LBBB (left bundle branch block)    bilat. BBB- PPM   Peyronie's disease     Past Surgical History:  Procedure Laterality Date   CARDIOVASCULAR STRESS TEST  10-18-2002  DR  CRENSHAW   NORMAL ADENOSINE CARDIOLITE/ EF 60%   CARDIOVERSION N/A 08/18/2020   Procedure: CARDIOVERSION;  Surgeon: Geralynn Rile, MD;  Location: [place];  Service: Cardiovascular;  Laterality: N/A;   CATARACT EXTRACTION W/ INTRAOCULAR LENS IMPLANT  2006   LEFT EYE   EP IMPLANTABLE DEVICE N/A 10/18/2015   Procedure: BiV Pacemaker Insertion CRT-P;  Surgeon: [name], MD;  Location: [place] [facility];  Service: Cardiovascular;  Laterality: N/A;   INGUINAL HERNIA REPAIR Right 02/23/2019   Procedure: RIGHT INGUINAL HERNIA REPAIR WITH MESH;  Surgeon: Erroll Luna, MD;  Location: [facility];  Service: General;  Laterality: Right;   LAPAROSCOPIC CHOLECYSTECTOMY  10-09-2007   LEFT HEART CATH AND CORONARY ANGIOGRAPHY N/A 01/30/2018   Procedure: LEFT HEART CATH AND CORONARY ANGIOGRAPHY;  Surgeon: Belva Crome, MD;  Location: [facility] [facility];  Service: Cardiovascular;  Laterality: N/A;   NESBIT PROCEDURE  01/17/2012   Procedure: NESBIT PROCEDURE;  Surgeon: [name], MD;  Location: [facility];  Service: Urology;  Laterality: N/A;  16 dot plication    REPAIR DETACHED RETINA, LEFT EYE   1993   REPAIR LEFT INGUINAL HERNIA  2005    Family History  Problem Relation Age of Onset   Diabetes Mother    Hypertension Mother    Hyperlipidemia Mother    Heart disease Father    Hyperlipidemia Father    Hypertension Father    Arrhythmia Father        paf   Healthy Daughter    Healthy Son    Heart disease Paternal Grandfather    Heart attack Paternal Grandfather     Social History   Socioeconomic History   Marital status: Married    Spouse name: Not on file   Number of children: Not on file   Years of education: Not on file   Highest education level: Not on file  Occupational History   Not on file  Tobacco Use   Smoking status: Never   Smokeless tobacco: Never  Vaping Use   Vaping Use: Never used  Substance and Sexual Activity   Alcohol use: No   Drug use: No   Sexual activity: Yes  Other Topics Concern   Not on file  Social History Narrative   Right handed    Lives with wife   Social Determinants of Health   Financial Resource Strain: Not on file  Food Insecurity: Not on file  Transportation Needs: Not on file  Physical Activity: Not on file  Stress: Not on file  Social Connections: Not on file  Intimate Partner Violence: Not  on file    Outpatient Medications Prior to Visit  Medication Sig Dispense Refill   atorvastatin (LIPITOR) 40 MG tablet TAKE 1 TABLET BY MOUTH EVERY DAY AT 6PM (Patient taking differently: Take 40 mg by mouth daily.) 90 tablet 2   carvedilol (COREG) 25 MG tablet TAKE 1 TABLET BY MOUTH TWICE A DAY WITH MEALS (Patient taking differently: Take 25 mg by mouth 2 (two) times daily with a meal.) 180 tablet 2   Cholecalciferol (VITAMIN D3) 5000 units TABS 5,000 IU OTC vitamin D3 daily. 90 tablet 3   clotrimazole-betamethasone (LOTRISONE) cream Apply 1 application topically 2 (two) times daily. (Patient taking differently: Apply 1 application topically 2 (two) times daily as needed (irritation).) 45 g 0   ELIQUIS 5 MG TABS tablet TAKE 1  TABLET BY MOUTH TWICE A DAY (Patient taking differently: Take 5 mg by mouth 2 (two) times daily.) 60 tablet 6   fexofenadine (ALLEGRA) 180 MG tablet Take 1 tablet (180 mg total) by mouth daily. During allergy season (Patient taking differently: Take 180 mg by mouth daily as needed for allergies. During allergy season) 180 tablet 0   FLUoxetine (PROZAC) 10 MG capsule Take 1 capsule (10 mg total) by mouth daily. 90 capsule 0   fluticasone (FLONASE) 50 MCG/ACT nasal spray USE 1 SPRAY IN EACH NOSTRIL AFTER SINUS RINSES TWICE DAILY. (Patient taking differently: Place 2 sprays into both nostrils 2 (two) times daily as needed for allergies.) 48 mL 1   furosemide (LASIX) 20 MG tablet Take 1 tablet (20 mg total) by mouth daily. 90 tablet 3   losartan (COZAAR) 25 MG tablet Take 0.5 tablets (12.5 mg total) by mouth daily. 45 tablet 3   Multiple Vitamins-Minerals (CENTRUM ADULTS) TABS Take 1 tablet by mouth daily.     sildenafil (REVATIO) 20 MG tablet Take 20-100 mg by mouth daily as needed.     tamsulosin (FLOMAX) 0.4 MG CAPS capsule Take 0.4 mg by mouth daily.     No facility-administered medications prior to visit.    No Known Allergies  ROS Review of Systems Review of Systems:  A fourteen system review of systems was performed and found to be positive as per HPI.   Objective:    Physical Exam General:  Well Developed, well nourished, appropriate for stated age.  Neuro:  Alert and oriented,  extra-ocular muscles intact  HEENT:  Normocephalic, atraumatic, neck supple Skin:  no gross rash, warm, pink. Cardiac:  Irregular rhythm Respiratory:  CTA B/L, Not using accessory muscles, speaking in full sentences- unlabored. Vascular:  Ext warm, no cyanosis apprec.; cap RF less 2 sec. Psych:  No HI/SI, judgement and insight good, Euthymic mood. Full Affect.  BP 109/71   Pulse 60   Temp 98.3 F (36.8 C)   Ht '5\' 8"'$  (1.727 m)   Wt 156 lb 3.2 oz (70.9 kg)   SpO2 98%   BMI 23.75 kg/m  Wt Readings  from Last 3 Encounters:  11/14/20 156 lb 3.2 oz (70.9 kg)  11/03/20 161 lb 6.4 oz (73.2 kg)  08/24/20 159 lb (72.1 kg)     Health Maintenance Due  Topic Date Due   Zoster Vaccines- Shingrix (1 of 2) Never done   PNA vac Low Risk Adult (1 of 2 - PCV13) Never done   COVID-19 Vaccine (4 - Booster for Pfizer series) 03/29/2020   INFLUENZA VACCINE  10/16/2020    There are no preventive care reminders to display for this patient.  Lab Results  Component  Value Date   TSH 0.96 02/08/2020   Lab Results  Component Value Date   WBC 5.5 08/09/2020   HGB 15.0 08/18/2020   HCT 44.0 08/18/2020   MCV 94.5 08/09/2020   PLT 167 08/09/2020   Lab Results  Component Value Date   NA 139 08/18/2020   K 4.3 08/18/2020   CO2 28 08/09/2020   GLUCOSE 105 (H) 08/18/2020   BUN 16 08/18/2020   CREATININE 1.00 08/18/2020   BILITOT 2.3 (H) 02/18/2019   ALKPHOS 90 02/18/2019   AST 24 02/18/2019   ALT 23 02/18/2019   PROT 7.3 02/18/2019   ALBUMIN 3.9 02/18/2019   CALCIUM 9.6 08/09/2020   ANIONGAP 4 (L) 08/09/2020   Lab Results  Component Value Date   CHOL 126 09/24/2018   Lab Results  Component Value Date   HDL 60 09/24/2018   Lab Results  Component Value Date   LDLCALC 52 09/24/2018   Lab Results  Component Value Date   TRIG 68 09/24/2018   Lab Results  Component Value Date   CHOLHDL 2.1 09/24/2018   Lab Results  Component Value Date   HGBA1C 5.5 09/24/2018      Assessment & Plan:   Problem List Items Addressed This Visit       Cardiovascular and Mediastinum   Persistent atrial fibrillation ([place])    -Followed by Cardiology.      Other Visit Diagnoses     Snoring    -  Primary   Episode of apnea          Snoring, Episode of apnea: -Patient completed STOP BANG questionnaire which indicates high risk for OSA and Epworth Sleepiness Scale with abnormal score of 13 so will place order for sleep study for further evaluation.   No orders of the defined types were  placed in this encounter.   Follow-up: Return for as scheduled .    Lorrene Reid, PA-C

## 2020-11-14 NOTE — Patient Instructions (Signed)

## 2020-11-14 NOTE — Assessment & Plan Note (Signed)
Followed by Cardiology 

## 2020-11-15 ENCOUNTER — Ambulatory Visit (HOSPITAL_COMMUNITY): Payer: Medicare HMO | Admitting: Certified Registered Nurse Anesthetist

## 2020-11-15 ENCOUNTER — Other Ambulatory Visit: Payer: Medicare HMO | Admitting: *Deleted

## 2020-11-15 ENCOUNTER — Encounter (HOSPITAL_COMMUNITY): Payer: Self-pay | Admitting: Cardiology

## 2020-11-15 ENCOUNTER — Ambulatory Visit (HOSPITAL_COMMUNITY)
Admission: RE | Admit: 2020-11-15 | Discharge: 2020-11-15 | Disposition: A | Discharge status: Home / Self Care | Payer: Medicare HMO | Attending: Cardiology | Admitting: Cardiology

## 2020-11-15 ENCOUNTER — Other Ambulatory Visit: Payer: Self-pay

## 2020-11-15 ENCOUNTER — Encounter (HOSPITAL_COMMUNITY)
Admission: RE | Disposition: A | Discharge status: Home / Self Care | Payer: Self-pay | Source: Home / Self Care | Attending: Cardiology

## 2020-11-15 DIAGNOSIS — Z7901 Long term (current) use of anticoagulants: Secondary | ICD-10-CM | POA: Insufficient documentation

## 2020-11-15 DIAGNOSIS — Z9049 Acquired absence of other specified parts of digestive tract: Secondary | ICD-10-CM | POA: Diagnosis not present

## 2020-11-15 DIAGNOSIS — I2510 Atherosclerotic heart disease of native coronary artery without angina pectoris: Secondary | ICD-10-CM | POA: Diagnosis not present

## 2020-11-15 DIAGNOSIS — Z79899 Other long term (current) drug therapy: Secondary | ICD-10-CM | POA: Diagnosis not present

## 2020-11-15 DIAGNOSIS — I4819 Other persistent atrial fibrillation: Secondary | ICD-10-CM | POA: Diagnosis not present

## 2020-11-15 DIAGNOSIS — E559 Vitamin D deficiency, unspecified: Secondary | ICD-10-CM | POA: Diagnosis not present

## 2020-11-15 DIAGNOSIS — Z950 Presence of cardiac pacemaker: Secondary | ICD-10-CM | POA: Diagnosis not present

## 2020-11-15 DIAGNOSIS — I459 Conduction disorder, unspecified: Secondary | ICD-10-CM | POA: Insufficient documentation

## 2020-11-15 DIAGNOSIS — I442 Atrioventricular block, complete: Secondary | ICD-10-CM

## 2020-11-15 DIAGNOSIS — Z8249 Family history of ischemic heart disease and other diseases of the circulatory system: Secondary | ICD-10-CM | POA: Insufficient documentation

## 2020-11-15 DIAGNOSIS — I10 Essential (primary) hypertension: Secondary | ICD-10-CM | POA: Diagnosis not present

## 2020-11-15 DIAGNOSIS — I428 Other cardiomyopathies: Secondary | ICD-10-CM | POA: Diagnosis not present

## 2020-11-15 HISTORY — PX: CARDIOVERSION: SHX1299

## 2020-11-15 HISTORY — DX: Presence of automatic (implantable) cardiac defibrillator: Z95.810

## 2020-11-15 HISTORY — DX: Presence of cardiac pacemaker: Z95.0

## 2020-11-15 LAB — POCT I-STAT, CHEM 8
BUN: 13 mg/dL (ref 8–23)
Calcium, Ion: 1.26 mmol/L (ref 1.15–1.40)
Chloride: 104 mmol/L (ref 98–111)
Creatinine, Ser: 0.8 mg/dL (ref 0.61–1.24)
Glucose, Bld: 105 mg/dL — ABNORMAL HIGH (ref 70–99)
HCT: 43 % (ref 39.0–52.0)
Hemoglobin: 14.6 g/dL (ref 13.0–17.0)
Potassium: 3.9 mmol/L (ref 3.5–5.1)
Sodium: 139 mmol/L (ref 135–145)
TCO2: 27 mmol/L (ref 22–32)

## 2020-11-15 LAB — BASIC METABOLIC PANEL
BUN/Creatinine Ratio: 13 (ref 10–24)
BUN: 12 mg/dL (ref 8–27)
CO2: 27 mmol/L (ref 20–29)
Calcium: 9.6 mg/dL (ref 8.6–10.2)
Chloride: 101 mmol/L (ref 96–106)
Creatinine, Ser: 0.94 mg/dL (ref 0.76–1.27)
Glucose: 98 mg/dL (ref 65–99)
Potassium: 4.4 mmol/L (ref 3.5–5.2)
Sodium: 141 mmol/L (ref 134–144)
eGFR: 89 mL/min/{1.73_m2} (ref >59)

## 2020-11-15 LAB — CBC
Hematocrit: 41.5 % (ref 37.5–51.0)
Hemoglobin: 14.3 g/dL (ref 13.0–17.7)
MCH: 30.8 pg (ref 26.6–33.0)
MCHC: 34.5 g/dL (ref 31.5–35.7)
MCV: 89 fL (ref 79–97)
Platelets: 186 10*3/uL (ref 150–450)
RBC: 4.64 x10E6/uL (ref 4.14–5.80)
RDW: 12.2 % (ref 11.6–15.4)
WBC: 7.4 10*3/uL (ref 3.4–10.8)

## 2020-11-15 SURGERY — CARDIOVERSION
Anesthesia: General

## 2020-11-15 MED ORDER — APIXABAN 5 MG PO TABS
5.0000 mg | ORAL_TABLET | Freq: Once | ORAL | Status: AC
  Administered 2020-11-15: 5 mg via ORAL
  Filled 2020-11-15: qty 1

## 2020-11-15 MED ORDER — SODIUM CHLORIDE 0.9 % IV SOLN: INTRAVENOUS | Status: DC | PRN

## 2020-11-15 MED ORDER — PROPOFOL 10 MG/ML IV BOLUS
INTRAVENOUS | Status: DC | PRN
  Administered 2020-11-15: 60 mg via INTRAVENOUS

## 2020-11-15 MED ORDER — LIDOCAINE 2% (20 MG/ML) 5 ML SYRINGE
INTRAMUSCULAR | Status: DC | PRN
  Administered 2020-11-15: 100 mg via INTRAVENOUS

## 2020-11-15 MED ORDER — SODIUM CHLORIDE 0.9 % IV SOLN
INTRAVENOUS | Status: AC | PRN
  Administered 2020-11-15: 500 mL via INTRAVENOUS

## 2020-11-15 NOTE — Anesthesia Procedure Notes (Signed)
Procedure Name: MAC Date/Time: 11/15/2020 10:07 AM Performed by: Dorthea Cove, CRNA Pre-anesthesia Checklist: Patient identified, Emergency Drugs available, Suction available, Timeout performed and Patient being monitored Patient Re-evaluated:Patient Re-evaluated prior to induction Oxygen Delivery Method: Ambu bag Preoxygenation: Pre-oxygenation with 100% oxygen Induction Type: IV induction Ventilation: Mask ventilation without difficulty Placement Confirmation: positive ETCO2 Dental Injury: Teeth and Oropharynx as per pre-operative assessment

## 2020-11-15 NOTE — CV Procedure (Addendum)
   Electrical Cardioversion Procedure Note George [name] [id number] 1950/07/09  Procedure: Electrical Cardioversion Indications:  Atrial Fibrillation  Time Out: Verified patient identification, verified procedure,medications/allergies/relevent history reviewed, required imaging and test results available.  Performed  Procedure Details  The patient was NPO after midnight. Anesthesia was administered at the beside  by Dr.Stolzfus with '60mg'$  of propofol and '100mg'$  Lidocaine.  Cardioversion was done with synchronized biphasic defibrillation with AP pads with 150watts.  The patient converted to AV paced rhythm.  The patient tolerated the procedure well   IMPRESSION:  Successful cardioversion of atrial fibrillation    George [name] 11/15/2020, 8:32 AM

## 2020-11-15 NOTE — Anesthesia Preprocedure Evaluation (Addendum)
Anesthesia Evaluation  Patient identified by MRN, date of birth, ID band Patient awake    Reviewed: Allergy & Precautions, NPO status , Patient's Chart, lab work & pertinent test results  Airway Mallampati: II  TM Distance: >3 FB Neck ROM: Full    Dental  (+) Poor Dentition, Missing   Pulmonary    Pulmonary exam normal        Cardiovascular hypertension, Pt. on medications and Pt. on home beta blockers + CAD  + dysrhythmias Atrial Fibrillation + Cardiac Defibrillator  Rhythm:Irregular Rate:Normal     Neuro/Psych negative neurological ROS  negative psych ROS   GI/Hepatic negative GI ROS, Neg liver ROS,   Endo/Other  negative endocrine ROS  Renal/GU negative Renal ROS  negative genitourinary   Musculoskeletal  (+) Arthritis , Osteoarthritis,    Abdominal (+)  Abdomen: soft. Bowel sounds: normal.  Peds  Hematology negative hematology ROS (+)   Anesthesia Other Findings   Reproductive/Obstetrics                            Anesthesia Physical Anesthesia Plan  ASA: 3  Anesthesia Plan: General   Post-op Pain Management:    Induction: Intravenous  PONV Risk Score and Plan: 2 and Propofol infusion and Treatment may vary due to age or medical condition  Airway Management Planned: Mask  Additional Equipment: None  Intra-op Plan:   Post-operative Plan:   Informed Consent: I have reviewed the patients History and Physical, chart, labs and discussed the procedure including the risks, benefits and alternatives for the proposed anesthesia with the patient or authorized representative who has indicated his/her understanding and acceptance.     Dental advisory given  Plan Discussed with: CRNA  Anesthesia Plan Comments: (Lab Results      Component                Value               Date                      WBC                      5.5                 08/09/2020                HGB                       14.6                11/15/2020                HCT                      43.0                11/15/2020                MCV                      94.5                08/09/2020                PLT  167                 08/09/2020           Lab Results      Component                Value               Date                      NA                       139                 11/15/2020                K                        3.9                 11/15/2020                CO2                      28                  08/09/2020                GLUCOSE                  105 (H)             11/15/2020                BUN                      13                  11/15/2020                CREATININE               0.80                11/15/2020                CALCIUM                  9.6                 08/09/2020                GFRNONAA                 >60                 08/09/2020                GFRAA                    103                 05/10/2020           ECHO 03/22: 1. Inferior wall hypokinesis . Left ventricular ejection fraction, by  estimation, is 50 to 55%. The left ventricle has low normal function. The  left ventricle has no regional wall motion abnormalities. The left  ventricular internal cavity size was  mildly dilated. There is mild asymmetric left ventricular hypertrophy of  the basal and septal segments. Left ventricular diastolic parameters are  indeterminate.  2. Pacing wire in RA/RV . Right ventricular systolic function is normal.  The right ventricular size is normal.  3. Left atrial size was mildly dilated.  4. Right atrial size was mildly dilated.  5. The mitral valve is abnormal. Mild mitral valve regurgitation. No  evidence of mitral stenosis.  6. The aortic valve is abnormal. Aortic valve regurgitation is mild. No  aortic stenosis is present.  7. The inferior vena cava is normal in size with greater than 50%  respiratory  variability, suggesting right atrial pressure of 3 mmHg. )        Anesthesia Quick Evaluation

## 2020-11-15 NOTE — Interval H&P Note (Signed)
History and Physical Interval Note:  11/15/2020 8:31 AM  George [name]  has presented today for surgery, with the diagnosis of AFIB.  The various methods of treatment have been discussed with the patient and family. After consideration of risks, benefits and other options for treatment, the patient has consented to  Procedure(s): CARDIOVERSION (N/A) as a surgical intervention.  The patient's history has been reviewed, patient examined, no change in status, stable for surgery.  I have reviewed the patient's chart and labs.  Questions were answered to the patient's satisfaction.     Fransico Him

## 2020-11-15 NOTE — Anesthesia Postprocedure Evaluation (Signed)
Anesthesia Post Note  Patient: George [name]  Procedure(s) Performed: CARDIOVERSION     Patient location during evaluation: Endoscopy Anesthesia Type: General Level of consciousness: awake and alert Pain management: pain level controlled Vital Signs Assessment: post-procedure vital signs reviewed and stable Respiratory status: spontaneous breathing, nonlabored ventilation, respiratory function stable and patient connected to nasal cannula oxygen Cardiovascular status: blood pressure returned to baseline and stable Postop Assessment: no apparent nausea or vomiting Anesthetic complications: no   No notable events documented.  Last Vitals:  Vitals:   11/15/20 1020 11/15/20 1030  BP: 132/84 115/60  Pulse: 62 61  Resp: 14 15  Temp:    SpO2: 98% 98%    Last Pain:  Vitals:   11/15/20 1030  TempSrc:   PainSc: 0-No pain                 Belenda Cruise P Marquarius Lofton

## 2020-11-15 NOTE — Transfer of Care (Signed)
Immediate Anesthesia Transfer of Care Note  Patient: George [name]  Procedure(s) Performed: CARDIOVERSION  Patient Location: Endoscopy Unit  Anesthesia Type:General  Level of Consciousness: drowsy and patient cooperative  Airway & Oxygen Therapy: Patient Spontanous Breathing  Post-op Assessment: Report given to RN and Post -op Vital signs reviewed and stable  Post vital signs: Reviewed and stable  Last Vitals:  Vitals Value Taken Time  BP 134/87   Temp    Pulse 57   Resp    SpO2 96     Last Pain:  Vitals:   11/15/20 0820  TempSrc: Temporal  PainSc: 0-No pain         Complications: No notable events documented.

## 2020-11-16 ENCOUNTER — Encounter (HOSPITAL_COMMUNITY): Payer: Self-pay | Admitting: Cardiology

## 2020-11-16 NOTE — Progress Notes (Signed)
Remote pacemaker transmission.   

## 2020-11-22 ENCOUNTER — Ambulatory Visit (HOSPITAL_COMMUNITY)
Admission: RE | Admit: 2020-11-22 | Discharge: 2020-11-22 | Disposition: A | Discharge status: Home / Self Care | Payer: Medicare HMO | Source: Ambulatory Visit | Attending: Physician Assistant | Admitting: Physician Assistant

## 2020-11-22 ENCOUNTER — Encounter (HOSPITAL_COMMUNITY): Payer: Self-pay | Admitting: Physician Assistant

## 2020-11-22 ENCOUNTER — Other Ambulatory Visit: Payer: Self-pay

## 2020-11-22 ENCOUNTER — Other Ambulatory Visit: Payer: Self-pay | Admitting: Physician Assistant

## 2020-11-22 VITALS — BP 136/88 | HR 62 | Ht 68.0 in | Wt 161.6 lb

## 2020-11-22 DIAGNOSIS — I4819 Other persistent atrial fibrillation: Secondary | ICD-10-CM

## 2020-11-22 DIAGNOSIS — I2510 Atherosclerotic heart disease of native coronary artery without angina pectoris: Secondary | ICD-10-CM | POA: Insufficient documentation

## 2020-11-22 DIAGNOSIS — Z7901 Long term (current) use of anticoagulants: Secondary | ICD-10-CM | POA: Insufficient documentation

## 2020-11-22 DIAGNOSIS — R0681 Apnea, not elsewhere classified: Secondary | ICD-10-CM

## 2020-11-22 DIAGNOSIS — R0683 Snoring: Secondary | ICD-10-CM

## 2020-11-22 DIAGNOSIS — I441 Atrioventricular block, second degree: Secondary | ICD-10-CM | POA: Diagnosis not present

## 2020-11-22 DIAGNOSIS — I10 Essential (primary) hypertension: Secondary | ICD-10-CM | POA: Diagnosis not present

## 2020-11-22 DIAGNOSIS — Z8249 Family history of ischemic heart disease and other diseases of the circulatory system: Secondary | ICD-10-CM | POA: Insufficient documentation

## 2020-11-22 DIAGNOSIS — D6869 Other thrombophilia: Secondary | ICD-10-CM | POA: Insufficient documentation

## 2020-11-22 DIAGNOSIS — Z79899 Other long term (current) drug therapy: Secondary | ICD-10-CM | POA: Insufficient documentation

## 2020-11-22 LAB — MAGNESIUM: Magnesium: 2.2 mg/dL (ref 1.7–2.4)

## 2020-11-22 NOTE — Progress Notes (Signed)
Primary Care Physician: Lorrene Reid, PA-C Primary Electrophysiologist: Dr Caryl Comes Referring Physician: Device clinic/Dr Lillia Dallas George [name] is a 70 y.o. male with a history of HTN, CAD, 2nd degree AV block II s/p CRT-P, ATach, NSVT, atrial fibrillation who presents for follow up in the [facility]. The patient was initially diagnosed with atrial fibrillation 07/20/20 after the device clinic received an alert for an ongoing episode. Patient has a CHADS2VASC score of 3. He denies palpitations but has noted more fatigue. There were no specific triggers that he could identify.   On follow up today, patient was seen in follow up on 11/03/20 and was back in afib. He underwent repeat DCCV on 11/15/20. He is in SR today. He has a sleep study pending. He denies any bleeding issues on anticoagulation.   Today, he denies symptoms of palpitations, chest pain, shortness of breath, orthopnea, PND, lower extremity edema, dizziness, presyncope, syncope, bleeding, or neurologic sequela. The patient is tolerating medications without difficulties and is otherwise without complaint today.    Atrial Fibrillation Risk Factors:  he does not have symptoms or diagnosis of sleep apnea. he does not have a history of rheumatic fever. he does not have a history of alcohol use. The patient does have a history of early familial atrial fibrillation or other arrhythmias. Father has afib.  he has a BMI of Body mass index is 24.57 kg/m.Marland Kitchen Filed Weights   11/22/20 0835  Weight: 73.3 kg     Family History  Problem Relation Age of Onset   Diabetes Mother    Hypertension Mother    Hyperlipidemia Mother    Heart disease Father    Hyperlipidemia Father    Hypertension Father    Arrhythmia Father        paf   Healthy Daughter    Healthy Son    Heart disease Paternal Grandfather    Heart attack Paternal Grandfather      Atrial Fibrillation Management history:  Previous  antiarrhythmic drugs: none Previous cardioversions: 08/18/20, 11/15/20 Previous ablations: none CHADS2VASC score: 3 Anticoagulation history: Eliquis   Past Medical History:  Diagnosis Date   AICD (automatic cardioverter/defibrillator) present    Arthritis THUMBS   LBBB (left bundle branch block)    bilat. BBB- PPM   Peyronie's disease    Presence of permanent cardiac pacemaker    Past Surgical History:  Procedure Laterality Date   CARDIOVASCULAR STRESS TEST  10-18-2002  DR  CRENSHAW   NORMAL ADENOSINE CARDIOLITE/ EF 60%   CARDIOVERSION N/A 08/18/2020   Procedure: CARDIOVERSION;  Surgeon: Geralynn Rile, MD;  Location: [place] [facility];  Service: Cardiovascular;  Laterality: N/A;   CARDIOVERSION N/A 11/15/2020   Procedure: CARDIOVERSION;  Surgeon: Sueanne Margarita, MD;  Location: [facility];  Service: Cardiovascular;  Laterality: N/A;   CATARACT EXTRACTION W/ INTRAOCULAR LENS IMPLANT  2006   LEFT EYE   EP IMPLANTABLE DEVICE N/A 10/18/2015   Procedure: BiV Pacemaker Insertion CRT-P;  Surgeon: [name], MD;  Location: [facility];  Service: Cardiovascular;  Laterality: N/A;   INGUINAL HERNIA REPAIR Right 02/23/2019   Procedure: RIGHT INGUINAL HERNIA REPAIR WITH MESH;  Surgeon: Erroll Luna, MD;  Location: [facility];  Service: General;  Laterality: Right;   LAPAROSCOPIC CHOLECYSTECTOMY  10-09-2007   LEFT HEART CATH AND CORONARY ANGIOGRAPHY N/A 01/30/2018   Procedure: LEFT HEART CATH AND CORONARY ANGIOGRAPHY;  Surgeon: Belva Crome, MD;  Location: [place] [place] [facility];  Service: Cardiovascular;  Laterality: N/A;   NESBIT PROCEDURE  01/17/2012   Procedure: NESBIT PROCEDURE;  Surgeon: [name], MD;  Location[facility];  Service: Urology;  Laterality: N/A;  16 dot plication    REPAIR DETACHED RETINA, LEFT EYE  1993   REPAIR LEFT INGUINAL HERNIA  2005    Current Outpatient Medications  Medication Sig Dispense Refill   atorvastatin  (LIPITOR) 40 MG tablet TAKE 1 TABLET BY MOUTH EVERY DAY AT 6PM 90 tablet 2   carvedilol (COREG) 25 MG tablet TAKE 1 TABLET BY MOUTH TWICE A DAY WITH MEALS 180 tablet 2   clotrimazole-betamethasone (LOTRISONE) cream Apply 1 application topically 2 (two) times daily. 45 g 0   ELIQUIS 5 MG TABS tablet TAKE 1 TABLET BY MOUTH TWICE A DAY 60 tablet 6   fexofenadine (ALLEGRA) 180 MG tablet Take 1 tablet (180 mg total) by mouth daily. During allergy season 180 tablet 0   FLUoxetine (PROZAC) 10 MG capsule Take 1 capsule (10 mg total) by mouth daily. 90 capsule 0   fluticasone (FLONASE) 50 MCG/ACT nasal spray USE 1 SPRAY IN EACH NOSTRIL AFTER SINUS RINSES TWICE DAILY. 48 mL 1   furosemide (LASIX) 20 MG tablet Take 1 tablet (20 mg total) by mouth daily. 90 tablet 3   losartan (COZAAR) 25 MG tablet Take 0.5 tablets (12.5 mg total) by mouth daily. 45 tablet 3   Multiple Vitamins-Minerals (CENTRUM ADULTS) TABS Take 1 tablet by mouth daily.     sildenafil (REVATIO) 20 MG tablet Take 20-100 mg by mouth daily as needed.     tamsulosin (FLOMAX) 0.4 MG CAPS capsule Take 0.4 mg by mouth daily.     Cholecalciferol (VITAMIN D3) 5000 units TABS 5,000 IU OTC vitamin D3 daily. (Patient not taking: Reported on 11/22/2020) 90 tablet 3   No current facility-administered medications for this encounter.    No Known Allergies  Social History   Socioeconomic History   Marital status: Married    Spouse name: Not on file   Number of children: Not on file   Years of education: Not on file   Highest education level: Not on file  Occupational History   Not on file  Tobacco Use   Smoking status: Never   Smokeless tobacco: Never  Vaping Use   Vaping Use: Never used  Substance and Sexual Activity   Alcohol use: No   Drug use: No   Sexual activity: Yes  Other Topics Concern   Not on file  Social History Narrative   Right handed    Lives with wife   Social Determinants of Health   Financial Resource Strain: Not on  file  Food Insecurity: Not on file  Transportation Needs: Not on file  Physical Activity: Not on file  Stress: Not on file  Social Connections: Not on file  Intimate Partner Violence: Not on file     ROS- All systems are reviewed and negative except as per the HPI above.  Physical Exam: Vitals:   11/22/20 0835  BP: 136/88  Pulse: 62  Weight: 73.3 kg  Height: '5\' 8"'$  (1.727 m)    GEN- The patient is a well appearing male, alert and oriented x 3 today.   HEENT-head normocephalic, atraumatic, sclera clear, conjunctiva pink, hearing intact, trachea midline. Lungs- Clear to ausculation bilaterally, normal work of breathing Heart- Regular rate and rhythm, no murmurs, rubs or gallops  GI- soft, NT, ND, + BS Extremities- no clubbing, cyanosis, or edema MS-  no significant deformity or atrophy Skin- no rash or lesion Psych- euthymic mood, full affect Neuro- strength and sensation are intact   Wt Readings from Last 3 Encounters:  11/22/20 73.3 kg  11/14/20 70.9 kg  11/03/20 73.2 kg    EKG today demonstrates  A sense V paced rhythm Vent. rate 62 BPM PR interval 136 ms QRS duration 140 ms QT/QTcB 480/487 ms  Echo 06/05/20 demonstrated  1. Inferior wall hypokinesis . Left ventricular ejection fraction, by  estimation, is 50 to 55%. The left ventricle has low normal function. The  left ventricle has no regional wall motion abnormalities. The left  ventricular internal cavity size was  mildly dilated. There is mild asymmetric left ventricular hypertrophy of  the basal and septal segments. Left ventricular diastolic parameters are  indeterminate.   2. Pacing wire in RA/RV . Right ventricular systolic function is normal.  The right ventricular size is normal.   3. Left atrial size was mildly dilated.   4. Right atrial size was mildly dilated.   5. The mitral valve is abnormal. Mild mitral valve regurgitation. No  evidence of mitral stenosis.   6. The aortic valve is abnormal.  Aortic valve regurgitation is mild. No  aortic stenosis is present.   7. The inferior vena cava is normal in size with greater than 50%  respiratory variability, suggesting right atrial pressure of 3 mmHg.   Epic records are reviewed at length today  CHA2DS2-VASc Score = 3  The patient's score is based upon: CHF History: No HTN History: Yes Diabetes History: No Stroke History: No Vascular Disease History: Yes Age Score: 1 Gender Score: 0      ASSESSMENT AND PLAN: 1. Persistent atrial fibrillation  The patient's CHA2DS2-VASc score is 3, indicating a 3.2% annual risk of stroke.   S/p DCCV 11/15/20 Patient wants to pursue dofetilide. Patient to check on price of dofetilide. Continue Eliquis 5 mg BID PharmD has screened medications QTc in SR 457 ms with RBBB Check magnesium  Continue Coreg 25 mg BID  2. Secondary Hypercoagulable State (ICD10:  D68.69) The patient is at significant risk for stroke/thromboembolism based upon his CHA2DS2-VASc Score of 3.  Continue Apixaban (Eliquis).   3. 2nd degree AV block type II S/p PPM, followed by Dr Caryl Comes and the device clinic.  4. CAD Nonobstructive on LHC 2019 No anginal symptoms.  5. Snoring/witnessed apnea Sleep study pending.   Follow up for dofetilide admission later this month.    [facility] [address] [place], [place] [address] [phone] 11/22/2020 2:00 PM

## 2020-11-22 NOTE — Patient Instructions (Signed)
On Friday, September 23rd - go for covid testing between 8am-230pm. -- [address] up - look for the blue tent behind building

## 2020-11-23 ENCOUNTER — Telehealth: Payer: Self-pay | Admitting: Pharmacist

## 2020-11-23 NOTE — Telephone Encounter (Signed)
Medication list reviewed in anticipation of upcoming Tikosyn initiation. Patient is not taking any contraindicated medications. He [place] taking 1 QTc prolonging medications. Fluoxetine may enhance the QTc prolonging effect of Tikosyn. Could consider changing to an antidepressant that does not interact, such as venlafaxine. If patient is stable on fluoxetine and does not wish to change, then close monitoring of QTc should be preformed.  Patient is anticoagulated on Eliquis on the appropriate dose. Please ensure that patient has not missed any anticoagulation doses in the 3 weeks prior to Tikosyn initiation.   Patient will need to be counseled to avoid use of Benadryl while on Tikosyn and in the 2-3 days prior to Tikosyn initiation.  Patient is on furosemide. Please ensure K is at least 4 and Mag is at least 2 prior to starting Tikosyn. Electrolytes should be monitored periodically.

## 2020-11-24 ENCOUNTER — Encounter (HOSPITAL_COMMUNITY): Payer: Self-pay

## 2020-11-25 ENCOUNTER — Telehealth: Payer: Self-pay

## 2020-11-25 NOTE — Telephone Encounter (Signed)
Lvm for pt to call back to schedule a AWV either in-person or with me on Sat. 12/02/20. Will try again later today or next Saturday.   Blima Singer RMA

## 2020-11-28 ENCOUNTER — Telehealth: Payer: Self-pay | Admitting: Physician Assistant

## 2020-11-28 NOTE — Telephone Encounter (Signed)
Cardiology is requesting the Fluoxetine to a different medication due to the potential side effects when taking with Tikosyn.   Neoma Laming [phone]

## 2020-11-29 NOTE — Telephone Encounter (Signed)
Patients spouse Neoma Laming has requested for patient to discontinue Fluoxetine and states that patient wants to hold off on starting another medication. AS, CMA

## 2020-12-01 NOTE — Telephone Encounter (Signed)
Pt discussed with PCP will stop fluoxetine and has decided not to replace at this time.

## 2020-12-05 ENCOUNTER — Telehealth: Payer: Self-pay | Admitting: Internal Medicine

## 2020-12-05 ENCOUNTER — Telehealth: Payer: Self-pay

## 2020-12-05 NOTE — Telephone Encounter (Signed)
Encounter not needed

## 2020-12-05 NOTE — Telephone Encounter (Signed)
Pt wife reports that patient feels like he has been in AF since 9/15.  His symptoms include fatigue (feeling "draggy").    Pt sent transmission last night for review.    Manual transmission reviewed.  Normal device function.  Presenting rate/ rhythm AS/ BVP 60s.  Device has not detected prolonged AF since 8/25.  Pt underwent DCCV on 8/31 and had f/u in AF clinic on 9/7.

## 2020-12-08 ENCOUNTER — Other Ambulatory Visit: Payer: Self-pay | Admitting: Internal Medicine

## 2020-12-08 ENCOUNTER — Other Ambulatory Visit: Payer: Self-pay | Admitting: Physician Assistant

## 2020-12-08 DIAGNOSIS — J3089 Other allergic rhinitis: Secondary | ICD-10-CM

## 2020-12-08 DIAGNOSIS — J302 Other seasonal allergic rhinitis: Secondary | ICD-10-CM

## 2020-12-09 ENCOUNTER — Ambulatory Visit: Payer: Medicare HMO

## 2020-12-09 LAB — SARS CORONAVIRUS 2 (TAT 6-24 HRS): SARS Coronavirus 2: NEGATIVE

## 2020-12-11 ENCOUNTER — Other Ambulatory Visit: Payer: Self-pay

## 2020-12-11 ENCOUNTER — Ambulatory Visit (HOSPITAL_COMMUNITY)
Admission: RE | Admit: 2020-12-11 | Discharge: 2020-12-11 | Disposition: A | Discharge status: Home / Self Care | Payer: Medicare HMO | Source: Ambulatory Visit | Attending: Physician Assistant | Admitting: Physician Assistant

## 2020-12-11 ENCOUNTER — Encounter (HOSPITAL_COMMUNITY): Payer: Self-pay | Admitting: Physician Assistant

## 2020-12-11 ENCOUNTER — Inpatient Hospital Stay: Admission: RE | Admit: 2020-12-11 | Payer: Medicare HMO | Source: Ambulatory Visit | Admitting: Internal Medicine

## 2020-12-11 VITALS — BP 130/70 | HR 55 | Ht 68.0 in | Wt 154.8 lb

## 2020-12-11 DIAGNOSIS — R0681 Apnea, not elsewhere classified: Secondary | ICD-10-CM | POA: Diagnosis not present

## 2020-12-11 DIAGNOSIS — D6869 Other thrombophilia: Secondary | ICD-10-CM | POA: Diagnosis not present

## 2020-12-11 DIAGNOSIS — I472 Ventricular tachycardia: Secondary | ICD-10-CM | POA: Diagnosis not present

## 2020-12-11 DIAGNOSIS — I10 Essential (primary) hypertension: Secondary | ICD-10-CM | POA: Insufficient documentation

## 2020-12-11 DIAGNOSIS — R0683 Snoring: Secondary | ICD-10-CM | POA: Insufficient documentation

## 2020-12-11 DIAGNOSIS — I4819 Other persistent atrial fibrillation: Secondary | ICD-10-CM

## 2020-12-11 DIAGNOSIS — Z950 Presence of cardiac pacemaker: Secondary | ICD-10-CM | POA: Insufficient documentation

## 2020-12-11 DIAGNOSIS — Z79899 Other long term (current) drug therapy: Secondary | ICD-10-CM | POA: Diagnosis not present

## 2020-12-11 DIAGNOSIS — I441 Atrioventricular block, second degree: Secondary | ICD-10-CM | POA: Insufficient documentation

## 2020-12-11 DIAGNOSIS — I2510 Atherosclerotic heart disease of native coronary artery without angina pectoris: Secondary | ICD-10-CM | POA: Insufficient documentation

## 2020-12-11 DIAGNOSIS — Z8249 Family history of ischemic heart disease and other diseases of the circulatory system: Secondary | ICD-10-CM | POA: Insufficient documentation

## 2020-12-11 DIAGNOSIS — Z7901 Long term (current) use of anticoagulants: Secondary | ICD-10-CM | POA: Insufficient documentation

## 2020-12-11 DIAGNOSIS — Z95810 Presence of automatic (implantable) cardiac defibrillator: Secondary | ICD-10-CM | POA: Diagnosis not present

## 2020-12-11 DIAGNOSIS — I428 Other cardiomyopathies: Secondary | ICD-10-CM | POA: Diagnosis not present

## 2020-12-11 LAB — BASIC METABOLIC PANEL
Anion gap: 8 (ref 5–15)
BUN: 7 mg/dL — ABNORMAL LOW (ref 8–23)
CO2: 29 mmol/L (ref 22–32)
Calcium: 8.9 mg/dL (ref 8.9–10.3)
Chloride: 102 mmol/L (ref 98–111)
Creatinine, Ser: 0.69 mg/dL (ref 0.61–1.24)
GFR, Estimated: >60 mL/min (ref >60)
Glucose, Bld: 108 mg/dL — ABNORMAL HIGH (ref 70–99)
Potassium: 3.5 mmol/L (ref 3.5–5.1)
Sodium: 139 mmol/L (ref 135–145)

## 2020-12-11 LAB — MAGNESIUM: Magnesium: 1.9 mg/dL (ref 1.7–2.4)

## 2020-12-11 MED ORDER — POTASSIUM CHLORIDE CRYS ER 20 MEQ PO TBCR: 20.0000 meq | EXTENDED_RELEASE_TABLET | Freq: Every day | ORAL | 0 refills | Status: DC

## 2020-12-11 MED ORDER — POTASSIUM CHLORIDE CRYS ER 20 MEQ PO TBCR: 40.0000 meq | EXTENDED_RELEASE_TABLET | Freq: Once | ORAL | 0 refills | Status: DC

## 2020-12-11 NOTE — Addendum Note (Signed)
Encounter addended by: Juluis Mire, RN on: 12/11/2020 1:36 PM  Actions taken: Pharmacy for encounter modified, Order list changed

## 2020-12-11 NOTE — Progress Notes (Signed)
Primary Care Physician: Lorrene Reid, PA-C Primary Electrophysiologist: Dr Caryl Comes Referring Physician: Device clinic/Dr Lillia Dallas George [name] is a 70 y.o. male with a history of HTN, CAD, 2nd degree AV block II s/p CRT-P, ATach, NSVT, atrial fibrillation who presents for follow up in the [facility]. The patient was initially diagnosed with atrial fibrillation 07/20/20 after the device clinic received an alert for an ongoing episode. Patient has a CHADS2VASC score of 3. He denies palpitations but noted more fatigue. There were no specific triggers that he could identify. Patient was seen in follow up on 11/03/20 and was back in afib. He underwent repeat DCCV on 11/15/20.   On follow up today, patient presents for dofetilide admission. He is in SR today. He denies any missed doses of anticoagulation. He is now off fluoxetine.    Today, he denies symptoms of palpitations, chest pain, shortness of breath, orthopnea, PND, lower extremity edema, dizziness, presyncope, syncope, bleeding, or neurologic sequela. The patient is tolerating medications without difficulties and is otherwise without complaint today.    Atrial Fibrillation Risk Factors:  he does not have symptoms or diagnosis of sleep apnea. he does not have a history of rheumatic fever. he does not have a history of alcohol use. The patient does have a history of early familial atrial fibrillation or other arrhythmias. Father has afib.  he has a BMI of Body mass index is 23.54 kg/m.Marland Kitchen Filed Weights   12/11/20 0853  Weight: 70.2 kg      Family History  Problem Relation Age of Onset   Diabetes Mother    Hypertension Mother    Hyperlipidemia Mother    Heart disease Father    Hyperlipidemia Father    Hypertension Father    Arrhythmia Father        paf   Healthy Daughter    Healthy Son    Heart disease Paternal Grandfather    Heart attack Paternal Grandfather      Atrial Fibrillation  Management history:  Previous antiarrhythmic drugs: none Previous cardioversions: 08/18/20, 11/15/20 Previous ablations: none CHADS2VASC score: 3 Anticoagulation history: Eliquis   Past Medical History:  Diagnosis Date   AICD (automatic cardioverter/defibrillator) present    Arthritis THUMBS   LBBB (left bundle branch block)    bilat. BBB- PPM   Peyronie's disease    Presence of permanent cardiac pacemaker    Past Surgical History:  Procedure Laterality Date   CARDIOVASCULAR STRESS TEST  10-18-2002  DR  CRENSHAW   NORMAL ADENOSINE CARDIOLITE/ EF 60%   CARDIOVERSION N/A 08/18/2020   Procedure: CARDIOVERSION;  Surgeon: Geralynn Rile, MD;  Location: [facility];  Service: Cardiovascular;  Laterality: N/A;   CARDIOVERSION N/A 11/15/2020   Procedure: CARDIOVERSION;  Surgeon: Sueanne Margarita, MD;  Location: [facility];  Service: Cardiovascular;  Laterality: N/A;   CATARACT EXTRACTION W/ INTRAOCULAR LENS IMPLANT  2006   LEFT EYE   EP IMPLANTABLE DEVICE N/A 10/18/2015   Procedure: BiV Pacemaker Insertion CRT-P;  Surgeon: [name], MD;  Location: [facility] [facility];  Service: Cardiovascular;  Laterality: N/A;   INGUINAL HERNIA REPAIR Right 02/23/2019   Procedure: RIGHT INGUINAL HERNIA REPAIR WITH MESH;  Surgeon: Erroll Luna, MD;  Location: [facility];  Service: General;  Laterality: Right;   LAPAROSCOPIC CHOLECYSTECTOMY  10-09-2007   LEFT HEART CATH AND CORONARY ANGIOGRAPHY N/A 01/30/2018   Procedure: LEFT HEART CATH AND CORONARY ANGIOGRAPHY;  Surgeon: Belva Crome, MD;  Location: [facility];  Service: Cardiovascular;  Laterality: N/A;   NESBIT PROCEDURE  01/17/2012   Procedure: NESBIT PROCEDURE;  Surgeon: [name], MD;  Location: [facility];  Service: Urology;  Laterality: N/A;  16 dot plication    REPAIR DETACHED RETINA, LEFT EYE  1993   REPAIR LEFT INGUINAL HERNIA  2005    Current Outpatient Medications  Medication Sig  Dispense Refill   atorvastatin (LIPITOR) 40 MG tablet TAKE 1 TABLET BY MOUTH EVERY DAY AT 6PM 90 tablet 2   carvedilol (COREG) 25 MG tablet TAKE 1 TABLET BY MOUTH TWICE A DAY WITH MEALS 180 tablet 2   Cholecalciferol (VITAMIN D3) 5000 units TABS 5,000 IU OTC vitamin D3 daily. 90 tablet 3   clotrimazole-betamethasone (LOTRISONE) cream Apply 1 application topically 2 (two) times daily. 45 g 0   ELIQUIS 5 MG TABS tablet TAKE 1 TABLET BY MOUTH TWICE A DAY 60 tablet 6   fexofenadine (ALLEGRA) 180 MG tablet TAKE 1 TABLET (180 MG TOTAL) BY MOUTH DAILY. DURING ALLERGY SEASON 90 tablet 1   fluticasone (FLONASE) 50 MCG/ACT nasal spray USE 1 SPRAY IN EACH NOSTRIL AFTER SINUS RINSES TWICE DAILY. 48 mL 1   furosemide (LASIX) 20 MG tablet Take 1 tablet (20 mg total) by mouth daily. 90 tablet 3   losartan (COZAAR) 25 MG tablet Take 0.5 tablets (12.5 mg total) by mouth daily. 45 tablet 3   Multiple Vitamins-Minerals (CENTRUM ADULTS) TABS Take 1 tablet by mouth daily.     sildenafil (REVATIO) 20 MG tablet Take 20-100 mg by mouth daily as needed.     tamsulosin (FLOMAX) 0.4 MG CAPS capsule Take 0.4 mg by mouth daily.     potassium chloride SA (KLOR-CON) 20 MEQ tablet Take 2 tablets (40 mEq total) by mouth once for 1 dose. 2 tablet 0   No current facility-administered medications for this encounter.    No Known Allergies  Social History   Socioeconomic History   Marital status: Married    Spouse name: Not on file   Number of children: Not on file   Years of education: Not on file   Highest education level: Not on file  Occupational History   Not on file  Tobacco Use   Smoking status: Never   Smokeless tobacco: Never  Vaping Use   Vaping Use: Never used  Substance and Sexual Activity   Alcohol use: No   Drug use: No   Sexual activity: Yes  Other Topics Concern   Not on file  Social History Narrative   Right handed    Lives with wife   Social Determinants of Health   Financial Resource  Strain: Not on file  Food Insecurity: Not on file  Transportation Needs: Not on file  Physical Activity: Not on file  Stress: Not on file  Social Connections: Not on file  Intimate Partner Violence: Not on file     ROS- All systems are reviewed and negative except as per the HPI above.  Physical Exam: Vitals:   12/11/20 0853  BP: 130/70  Pulse: (!) 55  Weight: 70.2 kg  Height: 5\' 8"  (1.727 m)    GEN- The patient is a well appearing male, alert and oriented x 3 today.   HEENT-head normocephalic, atraumatic, sclera clear, conjunctiva pink, hearing intact, trachea midline. Lungs- Clear to ausculation bilaterally, normal work of breathing Heart- Regular rate and rhythm, no murmurs, rubs or gallops  GI- soft, NT, ND, + BS Extremities-  no clubbing, cyanosis, or edema MS- no significant deformity or atrophy Skin- no rash or lesion Psych- euthymic mood, full affect Neuro- strength and sensation are intact   Wt Readings from Last 3 Encounters:  12/11/20 70.2 kg  11/22/20 73.3 kg  11/14/20 70.9 kg    EKG today demonstrates  A sense V paced rhythm Vent. rate 55 BPM PR interval 126 ms QRS duration 144 ms QT/QTcB 484/463 ms  Echo 06/05/20 demonstrated  1. Inferior wall hypokinesis . Left ventricular ejection fraction, by  estimation, is 50 to 55%. The left ventricle has low normal function. The left ventricle has no regional wall motion abnormalities. The left  ventricular internal cavity size was mildly dilated. There is mild asymmetric left ventricular hypertrophy of the basal and septal segments. Left ventricular diastolic parameters are indeterminate.   2. Pacing wire in RA/RV . Right ventricular systolic function is normal.  The right ventricular size is normal.   3. Left atrial size was mildly dilated.   4. Right atrial size was mildly dilated.   5. The mitral valve is abnormal. Mild mitral valve regurgitation. No  evidence of mitral stenosis.   6. The aortic valve is  abnormal. Aortic valve regurgitation is mild. No  aortic stenosis is present.   7. The inferior vena cava is normal in size with greater than 50%  respiratory variability, suggesting right atrial pressure of 3 mmHg.   Epic records are reviewed at length today  CHA2DS2-VASc Score = 3  The patient's score is based upon: CHF History: 0 HTN History: 1 Diabetes History: 0 Stroke History: 0 Vascular Disease History: 1 Age Score: 1 Gender Score: 0      ASSESSMENT AND PLAN: 1. Persistent atrial fibrillation  The patient's CHA2DS2-VASc score is 3, indicating a 3.2% annual risk of stroke.   S/p DCCV 11/15/20 Patient presents for dofetilide admission.  Patient aware of price of dofetilide. Continue Eliquis 5 mg BID, states no missed doses in the last 3 weeks. No recent benadryl use PharmD has screened medications, fluoxetine discontinued.  Labs today show creatinine at 0.69, K+ 3.5 and mag 1.9, CrCl calculated at 103 mL/min Continue Coreg 25 mg BID D/w Dr Caryl Comes, concern that K+ will be too low for dofetilide initiation. Given a dose of KCL 40 meq now, will take another 40 meq at bedtime. Will postpone dofetilide admission to tomorrow.   2. Secondary Hypercoagulable State (ICD10:  D68.69) The patient is at significant risk for stroke/thromboembolism based upon his CHA2DS2-VASc Score of 3.  Continue Apixaban (Eliquis).   3. 2nd degree AV block type II S/p PPM, followed by Dr Caryl Comes and the device clinic.  4. CAD Nonobstructive on LHC 2019 No anginal symptoms.  5. Snoring/witnessed apnea Sleep study pending.   Admission to be rescheduled for tomorrow 2/2 low K+.    [facility] [address] [place], [place] [address] [phone] 12/11/2020 12:14 PM

## 2020-12-12 ENCOUNTER — Encounter (HOSPITAL_COMMUNITY): Payer: Self-pay | Admitting: Cardiology

## 2020-12-12 ENCOUNTER — Ambulatory Visit (HOSPITAL_COMMUNITY)
Admission: RE | Admit: 2020-12-12 | Discharge: 2020-12-12 | Disposition: A | Discharge status: Home / Self Care | Payer: Medicare HMO | Source: Ambulatory Visit | Attending: Physician Assistant | Admitting: Physician Assistant

## 2020-12-12 ENCOUNTER — Other Ambulatory Visit: Payer: Self-pay

## 2020-12-12 ENCOUNTER — Inpatient Hospital Stay (HOSPITAL_COMMUNITY)
Admission: AD | Admit: 2020-12-12 | Discharge: 2020-12-15 | DRG: 309 | Disposition: A | Discharge status: Home / Self Care | Payer: Medicare HMO | Attending: Internal Medicine | Admitting: Internal Medicine

## 2020-12-12 DIAGNOSIS — I428 Other cardiomyopathies: Secondary | ICD-10-CM | POA: Diagnosis present

## 2020-12-12 DIAGNOSIS — R0681 Apnea, not elsewhere classified: Secondary | ICD-10-CM | POA: Diagnosis not present

## 2020-12-12 DIAGNOSIS — I472 Ventricular tachycardia: Secondary | ICD-10-CM | POA: Diagnosis not present

## 2020-12-12 DIAGNOSIS — I441 Atrioventricular block, second degree: Secondary | ICD-10-CM | POA: Diagnosis not present

## 2020-12-12 DIAGNOSIS — I10 Essential (primary) hypertension: Secondary | ICD-10-CM | POA: Diagnosis not present

## 2020-12-12 DIAGNOSIS — R0683 Snoring: Secondary | ICD-10-CM | POA: Diagnosis not present

## 2020-12-12 DIAGNOSIS — D6869 Other thrombophilia: Secondary | ICD-10-CM | POA: Diagnosis present

## 2020-12-12 DIAGNOSIS — Z8249 Family history of ischemic heart disease and other diseases of the circulatory system: Secondary | ICD-10-CM | POA: Diagnosis not present

## 2020-12-12 DIAGNOSIS — Z833 Family history of diabetes mellitus: Secondary | ICD-10-CM

## 2020-12-12 DIAGNOSIS — Z0000 Encounter for general adult medical examination without abnormal findings: Secondary | ICD-10-CM | POA: Diagnosis not present

## 2020-12-12 DIAGNOSIS — I2510 Atherosclerotic heart disease of native coronary artery without angina pectoris: Secondary | ICD-10-CM | POA: Diagnosis not present

## 2020-12-12 DIAGNOSIS — E785 Hyperlipidemia, unspecified: Secondary | ICD-10-CM | POA: Diagnosis present

## 2020-12-12 DIAGNOSIS — I4819 Other persistent atrial fibrillation: Principal | ICD-10-CM | POA: Diagnosis present

## 2020-12-12 DIAGNOSIS — Z95810 Presence of automatic (implantable) cardiac defibrillator: Secondary | ICD-10-CM | POA: Diagnosis not present

## 2020-12-12 DIAGNOSIS — Z7901 Long term (current) use of anticoagulants: Secondary | ICD-10-CM

## 2020-12-12 DIAGNOSIS — Z83438 Family history of other disorder of lipoprotein metabolism and other lipidemia: Secondary | ICD-10-CM | POA: Diagnosis not present

## 2020-12-12 DIAGNOSIS — Z79899 Other long term (current) drug therapy: Secondary | ICD-10-CM | POA: Diagnosis not present

## 2020-12-12 LAB — BASIC METABOLIC PANEL
Anion gap: 6 (ref 5–15)
BUN: 9 mg/dL (ref 8–23)
CO2: 29 mmol/L (ref 22–32)
Calcium: 9.3 mg/dL (ref 8.9–10.3)
Chloride: 103 mmol/L (ref 98–111)
Creatinine, Ser: 0.79 mg/dL (ref 0.61–1.24)
GFR, Estimated: >60 mL/min (ref >60)
Glucose, Bld: 112 mg/dL — ABNORMAL HIGH (ref 70–99)
Potassium: 4.1 mmol/L (ref 3.5–5.1)
Sodium: 138 mmol/L (ref 135–145)

## 2020-12-12 LAB — MAGNESIUM: Magnesium: 1.8 mg/dL (ref 1.7–2.4)

## 2020-12-12 LAB — HIV ANTIBODY (ROUTINE TESTING W REFLEX): HIV Screen 4th Generation wRfx: NONREACTIVE

## 2020-12-12 MED ORDER — LOSARTAN POTASSIUM 25 MG PO TABS
12.5000 mg | ORAL_TABLET | Freq: Every day | ORAL | Status: DC
  Administered 2020-12-13 – 2020-12-15 (×3): 12.5 mg via ORAL
  Filled 2020-12-12 (×3): qty 1

## 2020-12-12 MED ORDER — ATORVASTATIN CALCIUM 40 MG PO TABS
40.0000 mg | ORAL_TABLET | Freq: Every day | ORAL | Status: DC
  Administered 2020-12-12 – 2020-12-14 (×3): 40 mg via ORAL
  Filled 2020-12-12 (×3): qty 1

## 2020-12-12 MED ORDER — FUROSEMIDE 20 MG PO TABS
20.0000 mg | ORAL_TABLET | Freq: Every day | ORAL | Status: DC
  Administered 2020-12-13: 20 mg via ORAL
  Filled 2020-12-12: qty 1

## 2020-12-12 MED ORDER — SODIUM CHLORIDE 0.9 % IV SOLN: 250.0000 mL | INTRAVENOUS | Status: DC | PRN

## 2020-12-12 MED ORDER — LORATADINE 10 MG PO TABS: 10.0000 mg | ORAL_TABLET | Freq: Every day | ORAL | Status: DC

## 2020-12-12 MED ORDER — SODIUM CHLORIDE 0.9% FLUSH
3.0000 mL | Freq: Two times a day (BID) | INTRAVENOUS | Status: DC
  Administered 2020-12-12 – 2020-12-15 (×4): 3 mL via INTRAVENOUS

## 2020-12-12 MED ORDER — MAGNESIUM SULFATE 2 GM/50ML IV SOLN
2.0000 g | Freq: Once | INTRAVENOUS | Status: AC
  Administered 2020-12-12: 2 g via INTRAVENOUS
  Filled 2020-12-12: qty 50

## 2020-12-12 MED ORDER — APIXABAN 5 MG PO TABS
5.0000 mg | ORAL_TABLET | Freq: Two times a day (BID) | ORAL | Status: DC
  Administered 2020-12-12 – 2020-12-15 (×6): 5 mg via ORAL
  Filled 2020-12-12 (×6): qty 1

## 2020-12-12 MED ORDER — SODIUM CHLORIDE 0.9% FLUSH: 3.0000 mL | INTRAVENOUS | Status: DC | PRN

## 2020-12-12 MED ORDER — CARVEDILOL 25 MG PO TABS
25.0000 mg | ORAL_TABLET | Freq: Two times a day (BID) | ORAL | Status: DC
  Administered 2020-12-12 – 2020-12-15 (×6): 25 mg via ORAL
  Filled 2020-12-12 (×6): qty 1

## 2020-12-12 MED ORDER — FLUTICASONE PROPIONATE 50 MCG/ACT NA SUSP
1.0000 | Freq: Two times a day (BID) | NASAL | Status: DC | PRN
  Filled 2020-12-12: qty 16

## 2020-12-12 MED ORDER — TAMSULOSIN HCL 0.4 MG PO CAPS
0.4000 mg | ORAL_CAPSULE | Freq: Every day | ORAL | Status: DC
  Administered 2020-12-13 – 2020-12-15 (×3): 0.4 mg via ORAL
  Filled 2020-12-12 (×3): qty 1

## 2020-12-12 MED ORDER — DOFETILIDE 500 MCG PO CAPS
500.0000 ug | ORAL_CAPSULE | Freq: Two times a day (BID) | ORAL | Status: DC
  Administered 2020-12-12 – 2020-12-15 (×6): 500 ug via ORAL
  Filled 2020-12-12 (×6): qty 1

## 2020-12-12 NOTE — Care Management (Signed)
1440 12-12-20 Patient presented for Tikosyn Load. Benefits check submitted for medication cost. Case Manager will follow for cost and pharmacy of choice.

## 2020-12-12 NOTE — Progress Notes (Signed)
Pharmacy: Dofetilide (Tikosyn) - Initial Consult Assessment and Electrolyte Replacement  Pharmacy consulted to assist in monitoring and replacing electrolytes in this 70 y.o. male admitted on 12/12/2020 undergoing dofetilide initiation. First dofetilide dose: 500 mcg planned to start 9/27 @ 8 pm.  Assessment:  Patient Exclusion Criteria: If any screening criteria checked as "Yes", then  patient  should NOT receive dofetilide until criteria item is corrected.  If "Yes" please indicate correction plan.  YES  NO Patient  Exclusion Criteria Correction Plan   [x]   []   Baseline QTc interval is greater than or equal to 440 msec. IF above YES box checked dofetilide contraindicated unless patient has ICD; then may proceed if QTc 500-550 msec or with known ventricular conduction abnormalities may proceed with QTc 550-600 msec. QTc =  463 per clinic note - MD aware    []   [x]   Patient is known or suspected to have a digoxin level greater than 2 ng/ml: No results found for: DIGOXIN     []   [x]   Creatinine clearance less than 20 ml/min (calculated using Cockcroft-Gault, actual body weight and serum creatinine): Estimated Creatinine Clearance: 86.7 mL/min (by C-G formula based on SCr of 0.79 mg/dL).     []   [x]  Patient has received drugs known to prolong the QT intervals within the last 48 hours (phenothiazines, tricyclics or tetracyclic antidepressants, erythromycin, H-1 antihistamines, cisapride, fluoroquinolones, azithromycin). Drugs not listed above may have an, as yet, undetected potential to prolong the QT interval, updated information on QT prolonging agents is available at this website:QT prolonging agents or www.crediblemeds.org    []   [x]   Patient received a dose of hydrochlorothiazide (Oretic) alone or in any combination including triamterene (Dyazide, Maxzide) in the last 48 hours.    []   [x]  Patient received a medication known to increase dofetilide plasma concentrations prior to  initial dofetilide dose:  Trimethoprim (Primsol, Proloprim) in the last 36 hours Verapamil (Calan, Verelan) in the last 36 hours or a sustained release dose in the last 72 hours Megestrol (Megace) in the last 5 days  Cimetidine (Tagamet) in the last 6 hours Ketoconazole (Nizoral) in the last 24 hours Itraconazole (Sporanox) in the last 48 hours  Prochlorperazine (Compazine) in the last 36 hours     []   [x]   Patient is known to have a history of torsades de pointes; congenital or acquired long QT syndromes.    []   [x]   Patient has received a Class 1 antiarrhythmic with less than 2 half-lives since last dose. (Disopyramide, Quinidine, Procainamide, Lidocaine, Mexiletine, Flecainide, Propafenone)    []   [x]   Patient has received amiodarone therapy in the past 3 months or amiodarone level is greater than 0.3 ng/ml.    Patient has been appropriately anticoagulated with Eliquis.  Labs:    Component Value Date/Time   K 4.1 12/12/2020 0953   MG 1.8 12/12/2020 0953     Plan: Potassium: K >/= 4: Appropriate to initiate Tikosyn, no replacement needed    Magnesium: Mg 1.8-2: Give Mg 2 gm IV x1 to prevent Mg from dropping below 1.8 - do not need to recheck Mg. Appropriate to initiate Tikosyn   Thank you for allowing pharmacy to participate in this patient's care    [place] Crane, Roylene Reason, [facility] Clinical Pharmacist  12/12/2020 1:54 PM   [facility] pharmacy phone numbers are listed on [email or web address]

## 2020-12-12 NOTE — TOC Benefit Eligibility Note (Signed)
Transition of Care [facility]) Benefit Eligibility Note    Patient Details  Name: DESIREE FLEMING MRN: [id number] Date of Birth: Nov 25, 1950   Medication/Dose: DOFETILIDE CAPSULE    125 MCG BID  CO-PAY- $100.00   , 250 MCG BID  CO-PAY- $100.00  , 500 MCG BID CO-PAY- $100.00  Covered?: Yes  Tier:  (TIER- 4 DRUG)  Prescription Coverage [facility]erred [facility]  Spoke with Person/Company/Phone Number:: ANGEL  @ [employer] #  [phone] OPT- MEMBER  Co-Pay: $ 100.00  Prior Approval: No  Deductible:  (NO DEDUCTIBLE WITH PLAN  /   OUT-OF-POCKET:UNMET)  Additional Notes: TIKOSYN CAPSULE  125 MCG BID  ,  250 MCG BID  , 500 MCG BID  : NOT COVER / NON-FORMULARY , P/A - YES #  [id number]    Memory Argue Phone Number: 12/12/2020, 3:36 PM

## 2020-12-12 NOTE — H&P (Signed)
Primary Care Physician: Lorrene Reid, PA-C Primary Electrophysiologist: Dr Caryl Comes Referring Physician: Device clinic/Dr Lillia Dallas George [name] is a 70 y.o. male with a history of HTN, CAD, 2nd degree AV block II s/p CRT-P, ATach, NSVT, atrial fibrillation who presents for follow up in the [facility]. The patient was initially diagnosed with atrial fibrillation 07/20/20 after the device clinic received an alert for an ongoing episode. Patient has a CHADS2VASC score of 3. He denies palpitations but noted more fatigue. There were no specific triggers that he could identify. Patient was seen in follow up on 11/03/20 and was back in afib. He underwent repeat DCCV on 11/15/20.    On follow up today, patient presents for dofetilide admission. He is in SR today. He denies any missed doses of anticoagulation. He is now off fluoxetine.     Today, he denies symptoms of palpitations, chest pain, shortness of breath, orthopnea, PND, lower extremity edema, dizziness, presyncope, syncope, bleeding, or neurologic sequela. The patient is tolerating medications without difficulties and is otherwise without complaint today.      Atrial Fibrillation Risk Factors:   he does not have symptoms or diagnosis of sleep apnea. he does not have a history of rheumatic fever. he does not have a history of alcohol use. The patient does have a history of early familial atrial fibrillation or other arrhythmias. Father has afib.   he has a BMI of Body mass index is 23.54 kg/m.Marland Kitchen    Filed Weights    12/11/20 0853  Weight: 70.2 kg               Family History  Problem Relation Age of Onset   Diabetes Mother     Hypertension Mother     Hyperlipidemia Mother     Heart disease Father     Hyperlipidemia Father     Hypertension Father     Arrhythmia Father          paf   Healthy Daughter     Healthy Son     Heart disease Paternal Grandfather     Heart attack Paternal Grandfather           Atrial Fibrillation Management history:   Previous antiarrhythmic drugs: none Previous cardioversions: 08/18/20, 11/15/20 Previous ablations: none CHADS2VASC score: 3 Anticoagulation history: Eliquis         Past Medical History:  Diagnosis Date   AICD (automatic cardioverter/defibrillator) present     Arthritis THUMBS   LBBB (left bundle branch block)      bilat. BBB- PPM   Peyronie's disease     Presence of permanent cardiac pacemaker           Past Surgical History:  Procedure Laterality Date   CARDIOVASCULAR STRESS TEST   10-18-2002  DR  CRENSHAW    NORMAL ADENOSINE CARDIOLITE/ EF 60%   CARDIOVERSION N/A 08/18/2020    Procedure: CARDIOVERSION;  Surgeon: Geralynn Rile, MD;  Location: [place];  Service: Cardiovascular;  Laterality: N/A;   CARDIOVERSION N/A 11/15/2020    Procedure: CARDIOVERSION;  Surgeon: Sueanne Margarita, MD;  Location: [place] [facility];  Service: Cardiovascular;  Laterality: N/A;   CATARACT EXTRACTION W/ INTRAOCULAR LENS IMPLANT   2006    LEFT EYE   EP IMPLANTABLE DEVICE N/A 10/18/2015    Procedure: BiV Pacemaker Insertion CRT-P;  Surgeon: [name], MD;  Location: [place] [facility];  Service: Cardiovascular;  Laterality: N/A;   INGUINAL HERNIA  REPAIR Right 02/23/2019    Procedure: RIGHT INGUINAL HERNIA REPAIR WITH MESH;  Surgeon: Erroll Luna, MD;  Location: [place];  Service: General;  Laterality: Right;   LAPAROSCOPIC CHOLECYSTECTOMY   10-09-2007   LEFT HEART CATH AND CORONARY ANGIOGRAPHY N/A 01/30/2018    Procedure: LEFT HEART CATH AND CORONARY ANGIOGRAPHY;  Surgeon: Belva Crome, MD;  Location: [place] [facility];  Service: Cardiovascular;  Laterality: N/A;   NESBIT PROCEDURE   01/17/2012    Procedure: NESBIT PROCEDURE;  Surgeon: [name], MD;  Location: [facility];  Service: Urology;  Laterality: N/A;  16 dot plication    REPAIR DETACHED RETINA, LEFT EYE   1993   REPAIR LEFT INGUINAL HERNIA    2005            Current Outpatient Medications  Medication Sig Dispense Refill   atorvastatin (LIPITOR) 40 MG tablet TAKE 1 TABLET BY MOUTH EVERY DAY AT 6PM 90 tablet 2   carvedilol (COREG) 25 MG tablet TAKE 1 TABLET BY MOUTH TWICE A DAY WITH MEALS 180 tablet 2   Cholecalciferol (VITAMIN D3) 5000 units TABS 5,000 IU OTC vitamin D3 daily. 90 tablet 3   clotrimazole-betamethasone (LOTRISONE) cream Apply 1 application topically 2 (two) times daily. 45 g 0   ELIQUIS 5 MG TABS tablet TAKE 1 TABLET BY MOUTH TWICE A DAY 60 tablet 6   fexofenadine (ALLEGRA) 180 MG tablet TAKE 1 TABLET (180 MG TOTAL) BY MOUTH DAILY. DURING ALLERGY SEASON 90 tablet 1   fluticasone (FLONASE) 50 MCG/ACT nasal spray USE 1 SPRAY IN EACH NOSTRIL AFTER SINUS RINSES TWICE DAILY. 48 mL 1   furosemide (LASIX) 20 MG tablet Take 1 tablet (20 mg total) by mouth daily. 90 tablet 3   losartan (COZAAR) 25 MG tablet Take 0.5 tablets (12.5 mg total) by mouth daily. 45 tablet 3   Multiple Vitamins-Minerals (CENTRUM ADULTS) TABS Take 1 tablet by mouth daily.       sildenafil (REVATIO) 20 MG tablet Take 20-100 mg by mouth daily as needed.       tamsulosin (FLOMAX) 0.4 MG CAPS capsule Take 0.4 mg by mouth daily.       potassium chloride SA (KLOR-CON) 20 MEQ tablet Take 2 tablets (40 mEq total) by mouth once for 1 dose. 2 tablet 0    No current facility-administered medications for this encounter.      No Known Allergies   Social History         Socioeconomic History   Marital status: Married      Spouse name: Not on file   Number of children: Not on file   Years of education: Not on file   Highest education level: Not on file  Occupational History   Not on file  Tobacco Use   Smoking status: Never   Smokeless tobacco: Never  Vaping Use   Vaping Use: Never used  Substance and Sexual Activity   Alcohol use: No   Drug use: No   Sexual activity: Yes  Other Topics Concern   Not on file  Social History Narrative    Right  handed     Lives with wife    Social Determinants of Health    Financial Resource Strain: Not on file  Food Insecurity: Not on file  Transportation Needs: Not on file  Physical Activity: Not on file  Stress: Not on file  Social Connections: Not on file  Intimate Partner Violence: Not on file  ROS- All systems are reviewed and negative except as per the HPI above.   Physical Exam:    Vitals:    12/11/20 0853  BP: 130/70  Pulse: (!) 55  Weight: 70.2 kg  Height: 5\' 8"  (1.727 m)      GEN- The patient is a well appearing male, alert and oriented x 3 today.   HEENT-head normocephalic, atraumatic, sclera clear, conjunctiva pink, hearing intact, trachea midline. Lungs- Clear to ausculation bilaterally, normal work of breathing Heart- Regular rate and rhythm, no murmurs, rubs or gallops  GI- soft, NT, ND, + BS Extremities- no clubbing, cyanosis, or edema MS- no significant deformity or atrophy Skin- no rash or lesion Psych- euthymic mood, full affect Neuro- strength and sensation are intact        Wt Readings from Last 3 Encounters:  12/11/20 70.2 kg  11/22/20 73.3 kg  11/14/20 70.9 kg      EKG today demonstrates  A sense V paced rhythm Vent. rate 55 BPM PR interval 126 ms QRS duration 144 ms QT/QTcB 484/463 ms   Echo 06/05/20 demonstrated  1. Inferior wall hypokinesis . Left ventricular ejection fraction, by  estimation, is 50 to 55%. The left ventricle has low normal function. The left ventricle has no regional wall motion abnormalities. The left  ventricular internal cavity size was mildly dilated. There is mild asymmetric left ventricular hypertrophy of the basal and septal segments. Left ventricular diastolic parameters are indeterminate.   2. Pacing wire in RA/RV . Right ventricular systolic function is normal.  The right ventricular size is normal.   3. Left atrial size was mildly dilated.   4. Right atrial size was mildly dilated.   5. The mitral valve  is abnormal. Mild mitral valve regurgitation. No  evidence of mitral stenosis.   6. The aortic valve is abnormal. Aortic valve regurgitation is mild. No  aortic stenosis is present.   7. The inferior vena cava is normal in size with greater than 50%  respiratory variability, suggesting right atrial pressure of 3 mmHg.    Epic records are reviewed at length today   CHA2DS2-VASc Score = 3  The patient's score is based upon: CHF History: 0 HTN History: 1 Diabetes History: 0 Stroke History: 0 Vascular Disease History: 1 Age Score: 1 Gender Score: 0        ASSESSMENT AND PLAN: 1. Persistent atrial fibrillation  The patient's CHA2DS2-VASc score is 3, indicating a 3.2% annual risk of stroke.   S/p DCCV 11/15/20 Patient presents for dofetilide admission.  Patient aware of price of dofetilide. Continue Eliquis 5 mg BID, states no missed doses in the last 3 weeks. No recent benadryl use PharmD has screened medications, fluoxetine discontinued.  Labs today show creatinine at 0.69, K+ 3.5 and mag 1.9, CrCl calculated at 103 mL/min Continue Coreg 25 mg BID D/w Dr Caryl Comes, concern that K+ will be too low for dofetilide initiation. Given a dose of KCL 40 meq now, will take another 40 meq at bedtime. Will postpone dofetilide admission to tomorrow.    2. Secondary Hypercoagulable State (ICD10:  D68.69) The patient is at significant risk for stroke/thromboembolism based upon his CHA2DS2-VASc Score of 3.  Continue Apixaban (Eliquis).    3. 2nd degree AV block type II S/p PPM, followed by Dr Caryl Comes and the device clinic.   4. CAD Nonobstructive on LHC 2019 No anginal symptoms.   5. Snoring/witnessed apnea Sleep study pending.      Adline Peals PA-C Afib Clinic  [facility] [place], [place] [address] [phone] 12/11/2020 12:14 PM  ----------------------------------------------------  I have seen, examined the patient, and reviewed the above assessment and  plan.    Presenting for dofetilide loading. Cr and QTc acceptable for initial loading dose.  Vickie Epley, MD 12/12/2020 9:11 PM

## 2020-12-13 ENCOUNTER — Encounter: Payer: Self-pay | Admitting: Physician Assistant

## 2020-12-13 ENCOUNTER — Encounter (HOSPITAL_COMMUNITY): Payer: Self-pay | Admitting: Certified Registered Nurse Anesthetist

## 2020-12-13 ENCOUNTER — Ambulatory Visit (INDEPENDENT_AMBULATORY_CARE_PROVIDER_SITE_OTHER): Payer: Medicare HMO | Admitting: Physician Assistant

## 2020-12-13 ENCOUNTER — Encounter (HOSPITAL_COMMUNITY)
Admission: AD | Disposition: A | Discharge status: Home / Self Care | Payer: Self-pay | Source: Ambulatory Visit | Attending: Internal Medicine

## 2020-12-13 VITALS — Ht 68.0 in | Wt 158.0 lb

## 2020-12-13 DIAGNOSIS — Z0000 Encounter for general adult medical examination without abnormal findings: Secondary | ICD-10-CM

## 2020-12-13 LAB — BASIC METABOLIC PANEL
Anion gap: 4 — ABNORMAL LOW (ref 5–15)
BUN: 9 mg/dL (ref 8–23)
CO2: 30 mmol/L (ref 22–32)
Calcium: 9.1 mg/dL (ref 8.9–10.3)
Chloride: 103 mmol/L (ref 98–111)
Creatinine, Ser: 0.71 mg/dL (ref 0.61–1.24)
GFR, Estimated: >60 mL/min (ref >60)
Glucose, Bld: 106 mg/dL — ABNORMAL HIGH (ref 70–99)
Potassium: 3.8 mmol/L (ref 3.5–5.1)
Sodium: 137 mmol/L (ref 135–145)

## 2020-12-13 LAB — MAGNESIUM: Magnesium: 2.1 mg/dL (ref 1.7–2.4)

## 2020-12-13 SURGERY — CARDIOVERSION
Anesthesia: General

## 2020-12-13 MED ORDER — POTASSIUM CHLORIDE CRYS ER 20 MEQ PO TBCR
40.0000 meq | EXTENDED_RELEASE_TABLET | Freq: Once | ORAL | Status: AC
  Administered 2020-12-13: 40 meq via ORAL
  Filled 2020-12-13: qty 2

## 2020-12-13 NOTE — Progress Notes (Signed)
Progress Note  Patient Name: George [name] Date of Encounter: 12/13/2020  [facility] Cardiologist: Virl Axe, MD   Subjective   No complaints, slept pretty well  Inpatient Medications    Scheduled Meds:  apixaban  5 mg Oral BID   atorvastatin  40 mg Oral QHS   carvedilol  25 mg Oral BID WC   dofetilide  500 mcg Oral BID   furosemide  20 mg Oral Daily   losartan  12.5 mg Oral Daily   sodium chloride flush  3 mL Intravenous Q12H   tamsulosin  0.4 mg Oral Daily   Continuous Infusions:  sodium chloride     PRN Meds: sodium chloride, fluticasone, sodium chloride flush   Vital Signs    Vitals:   12/12/20 1344 12/12/20 2025 12/13/20 0500  BP: 112/78 135/77 139/86  Pulse: (!) 55  (!) 56  Resp: 18  18  Temp: 98.6 F (37 C) 97.8 F (36.6 C) 98.3 F (36.8 C)  TempSrc: Oral Oral Oral  SpO2: 96%  98%  Weight: 72 kg    Height: 5\' 8"  (1.727 m)      Intake/Output Summary (Last 24 hours) at 12/13/2020 0711 Last data filed at 12/12/2020 2030 Gross per 24 hour  Intake 889.97 ml  Output --  Net 889.97 ml   Last 3 Weights 12/12/2020 12/11/2020 11/22/2020  Weight (lbs) 158 lb 11.2 oz 154 lb 12.8 oz 161 lb 9.6 oz  Weight (kg) 71.986 kg 70.217 kg 73.301 kg      Telemetry    SR/VP, occ AV pacing, rare PACs - Personally Reviewed  ECG    SR/V paced, manually measured QT 500-525ms, QRs 167ms >> QTc 450-456ms - Personally Reviewed  Physical Exam   GEN: No acute distress.   Neck: No JVD Cardiac: RRR, no murmurs, rubs, or gallops.  Respiratory: CTA b/l. GI: Soft, nontender, non-distended  MS: No edema; No deformity. Neuro:  Nonfocal  Psych: Normal affect   Labs    High Sensitivity Troponin:  No results for input(s): TROPONINIHS in the last 720 hours.   Chemistry Recent Labs  Lab 12/11/20 0841 12/12/20 0953 12/13/20 0244  NA 139 138 137  K 3.5 4.1 3.8  CL 102 103 103  CO2 29 29 30   GLUCOSE 108* 112* 106*  BUN 7* 9 9  CREATININE 0.69 0.79 0.71  CALCIUM  8.9 9.3 9.1  MG 1.9 1.8 2.1  GFRNONAA >60 >60 >60  ANIONGAP 8 6 4*    Lipids No results for input(s): CHOL, TRIG, HDL, LABVLDL, LDLCALC, CHOLHDL in the last 168 hours.  HematologyNo results for input(s): WBC, RBC, HGB, HCT, MCV, MCH, MCHC, RDW, PLT in the last 168 hours. Thyroid No results for input(s): TSH, FREET4 in the last 168 hours.  BNPNo results for input(s): BNP, PROBNP in the last 168 hours.  DDimer No results for input(s): DDIMER in the last 168 hours.   Radiology    No results found.  Cardiac Studies   Echo 06/05/20  1. Inferior wall hypokinesis . Left ventricular ejection fraction, by  estimation, is 50 to 55%. The left ventricle has low normal function. The  left ventricle has no regional wall motion abnormalities. The left  ventricular internal cavity size was  mildly dilated. There is mild asymmetric left ventricular hypertrophy of  the basal and septal segments. Left ventricular diastolic parameters are  indeterminate.   2. Pacing wire in RA/RV . Right ventricular systolic function is normal.  The right ventricular  size is normal.   3. Left atrial size was mildly dilated.   4. Right atrial size was mildly dilated.   5. The mitral valve is abnormal. Mild mitral valve regurgitation. No  evidence of mitral stenosis.   6. The aortic valve is abnormal. Aortic valve regurgitation is mild. No  aortic stenosis is present.   7. The inferior vena cava is normal in size with greater than 50%  respiratory variability, suggesting right atrial pressure of 3 mmHg.    Patient Profile     70 y.o. male  history of HTN, NICM w/recovered LVEF,  2nd degree AVblock,II, s/p CRT-P, ATach, NSVT, AFib admitted for Tikosyn admission   Device information:  MDT CRT-P, implanted 10/18/15, Dr. Caryl Comes  Assessment & Plan    Persistent AFib CHA2DS2Vasc is 3, on Eliquis Tikosyn load is in progress K+ 3.8 (replacement ordered via [facility]) Mag 2.1 Creat 0.71 QTc is OK accounting for paced QRS  15ms  2. HLD Home meds  3. NICM Recovered LVEF Home regime No symptoms or signs of volume OL  For questions or updates, please contact [place] Please consult [email or web address] for contact info under        Signed, Baldwin [place], PA-C  12/13/2020, 7:11 AM

## 2020-12-13 NOTE — Patient Instructions (Signed)
Preventive Care 29 Years and Older, Male Preventive care refers to lifestyle choices and visits with your health care provider that can promote health and wellness. This includes: A yearly physical exam. This is also called an annual wellness visit. Regular dental and eye exams. Immunizations. Screening for certain conditions. Healthy lifestyle choices, such as: Eating a healthy diet. Getting regular exercise. Not using drugs or products that contain nicotine and tobacco. Limiting alcohol use. What can I expect for my preventive care visit? Physical exam Your health care provider will check your: Height and weight. These may be used to calculate your BMI (body mass index). BMI is a measurement that tells if you are at a healthy weight. Heart rate and blood pressure. Body temperature. Skin for abnormal spots. Counseling Your health care provider may ask you questions about your: Past medical problems. Family's medical history. Alcohol, tobacco, and drug use. Emotional well-being. Home life and relationship well-being. Sexual activity. Diet, exercise, and sleep habits. History of falls. Memory and ability to understand (cognition). Work and work Statistician. Access to firearms. What immunizations do I need? Vaccines are usually given at various ages, according to a schedule. Your health care provider will recommend vaccines for you based on your age, medical history, and lifestyle or other factors, such as travel or where you work. What tests do I need? Blood tests Lipid and cholesterol levels. These may be checked every 5 years, or more often depending on your overall health. Hepatitis C test. Hepatitis B test. Screening Lung cancer screening. You may have this screening every year starting at age 68 if you have a 30-pack-year history of smoking and currently smoke or have quit within the past 15 years. Colorectal cancer screening. All adults should have this screening  starting at age 68 and continuing until age 83. Your health care provider may recommend screening at age 4 if you are at increased risk. You will have tests every 1-10 years, depending on your results and the type of screening test. Prostate cancer screening. Recommendations will vary depending on your family history and other risks. Genital exam to check for testicular cancer or hernias. Diabetes screening. This is done by checking your blood sugar (glucose) after you have not eaten for a while (fasting). You may have this done every 1-3 years. Abdominal aortic aneurysm (AAA) screening. You may need this if you are a current or former smoker. STD (sexually transmitted disease) testing, if you are at risk. Follow these instructions at home: Eating and drinking  Eat a diet that includes fresh fruits and vegetables, whole grains, lean protein, and low-fat dairy products. Limit your intake of foods with high amounts of sugar, saturated fats, and salt. Take vitamin and mineral supplements as recommended by your health care provider. Do not drink alcohol if your health care provider tells you not to drink. If you drink alcohol: Limit how much you have to 0-2 drinks a day. Be aware of how much alcohol is in your drink. In the [place] one drink equals one 12 oz bottle of beer (355 mL), one 5 oz glass of wine (148 mL), or one 1 oz glass of hard liquor (44 mL). Lifestyle Take daily care of your teeth and gums. Brush your teeth every morning and night with fluoride toothpaste. Floss one time each day. Stay active. Exercise for at least 30 minutes 5 or more days each week. Do not use any products that contain nicotine or tobacco, such as cigarettes, e-cigarettes, and chewing tobacco. If  you need help quitting, ask your health care provider. Do not use drugs. If you are sexually active, practice safe sex. Use a condom or other form of protection to prevent STIs (sexually transmitted infections). Talk  with your health care provider about taking a low-dose aspirin or statin. Find healthy ways to cope with stress, such as: Meditation, yoga, or listening to music. Journaling. Talking to a trusted person. Spending time with friends and family. Safety Always wear your seat belt while driving or riding in a vehicle. Do not drive: If you have been drinking alcohol. Do not ride with someone who has been drinking. When you are tired or distracted. While texting. Wear a helmet and other protective equipment during sports activities. If you have firearms in your house, make sure you follow all gun safety procedures. What's next? Visit your health care provider once a year for an annual wellness visit. Ask your health care provider how often you should have your eyes and teeth checked. Stay up to date on all vaccines. This information is not intended to replace advice given to you by your health care provider. Make sure you discuss any questions you have with your health care provider. Document Revised: 05/12/2020 Document Reviewed: 02/26/2018 Elsevier Patient Education  2022 [employer].

## 2020-12-13 NOTE — Progress Notes (Signed)
Pharmacy: Dofetilide (Tikosyn) - Follow Up Assessment and Electrolyte Replacement  Pharmacy consulted to assist in monitoring and replacing electrolytes in this 70 y.o. male admitted on 12/12/2020 undergoing dofetilide initiation. First dofetilide dose: 500 mcg 9/27 @ 2022  Labs:    Component Value Date/Time   K 3.8 12/13/2020 0244   MG 2.1 12/13/2020 0244     Plan: Potassium: K 3.8-3.9:  Give KCl 40 mEq po x1   Magnesium: Mg > 2: No additional supplementation needed    Thank you for allowing pharmacy to participate in this patient's care   [place] Crane, Roylene Reason, [facility] Clinical Pharmacist  12/13/2020 7:12 AM   [facility] pharmacy phone numbers are listed on [email or web address]

## 2020-12-13 NOTE — Progress Notes (Signed)
Virtual Visit via Telephone Note:  I connected with [name] by telephone and verified that I am speaking with the correct person using two identifiers.    I discussed the limitations, risks, security and privacy concerns for performing an evaluation and management service by telephone and the availability of in person appointments. The staff discussed with patient that there may be a patient responsible charge related to this service. The patient expressed understanding and agreed to proceed.   [facility]  Location of Provider- Office    Subjective:   George [name] is a 70 y.o. male who presents for Medicare Annual/Subsequent preventive examination.  Review of Systems    General:   No F/C, wt loss, +fatigue  Pulm:   No DIB, SOB, pleuritic chest pain Card:  No CP, +palpitations Abd:  No n/v/d or pain Ext:  No  edema      Objective:    Today's Vitals   12/13/20 1114  Weight: 158 lb (71.7 kg)  Height: 5\' 8"  (1.727 m)   Body mass index is 24.02 kg/m.  Advanced Directives 12/13/2020 12/12/2020 11/15/2020 08/18/2020 02/08/2020 02/23/2019 02/16/2019  Does Patient Have a Medical Advance Directive? No No No Yes No Yes No  Type of Advance Directive - - Public librarian;Living will - Living will -  Does patient want to make changes to medical advance directive? - - - - - No - Patient declined -  Copy of [place] in Chart? - - - No - copy requested - - -  Would patient like information on creating a medical advance directive? - Yes (Inpatient - patient requests chaplain consult to create a medical advance directive) No - Patient declined - - No - Patient declined No - Patient declined  Pre-existing out of facility DNR order (yellow form or pink MOST form) - - - - - - -    Current Medications (verified) Facility-Administered Encounter Medications as of 12/13/2020  Medication   0.9 %  sodium chloride infusion   apixaban  (ELIQUIS) tablet 5 mg   atorvastatin (LIPITOR) tablet 40 mg   carvedilol (COREG) tablet 25 mg   dofetilide (TIKOSYN) capsule 500 mcg   fluticasone (FLONASE) 50 MCG/ACT nasal spray 1 spray   furosemide (LASIX) tablet 20 mg   losartan (COZAAR) tablet 12.5 mg   sodium chloride flush (NS) 0.9 % injection 3 mL   sodium chloride flush (NS) 0.9 % injection 3 mL   tamsulosin (FLOMAX) capsule 0.4 mg   Outpatient Encounter Medications as of 12/13/2020  Medication Sig   atorvastatin (LIPITOR) 40 MG tablet TAKE 1 TABLET BY MOUTH EVERY DAY AT 6PM (Patient taking differently: Take 40 mg by mouth daily.)   carvedilol (COREG) 25 MG tablet TAKE 1 TABLET BY MOUTH TWICE A DAY WITH MEALS (Patient taking differently: Take 25 mg by mouth 2 (two) times daily with a meal.)   Cholecalciferol (VITAMIN D3) 5000 units TABS 5,000 IU OTC vitamin D3 daily.   clotrimazole-betamethasone (LOTRISONE) cream Apply 1 application topically 2 (two) times daily. (Patient taking differently: Apply 1 application topically 2 (two) times daily as needed (irrtation).)   ELIQUIS 5 MG TABS tablet TAKE 1 TABLET BY MOUTH TWICE A DAY (Patient taking differently: Take 5 mg by mouth 2 (two) times daily.)   fexofenadine (ALLEGRA) 180 MG tablet TAKE 1 TABLET (180 MG TOTAL) BY MOUTH DAILY. DURING ALLERGY SEASON   fluticasone (FLONASE) 50 MCG/ACT nasal spray USE 1  SPRAY IN EACH NOSTRIL AFTER SINUS RINSES TWICE DAILY. (Patient taking differently: Place 1 spray into both nostrils daily as needed for allergies.)   furosemide (LASIX) 20 MG tablet Take 1 tablet (20 mg total) by mouth daily.   losartan (COZAAR) 25 MG tablet Take 0.5 tablets (12.5 mg total) by mouth daily. (Patient taking differently: Take 25 mg by mouth daily.)   Multiple Vitamins-Minerals (CENTRUM ADULTS) TABS Take 1 tablet by mouth daily.   potassium chloride SA (KLOR-CON) 20 MEQ tablet Take 1 tablet (20 mEq total) by mouth daily.   sildenafil (REVATIO) 20 MG tablet Take 20-100 mg by  mouth daily as needed (erectile disfunction).   tamsulosin (FLOMAX) 0.4 MG CAPS capsule Take 0.4 mg by mouth daily.    Allergies (verified) Patient has no known allergies.   History: Past Medical History:  Diagnosis Date   AICD (automatic cardioverter/defibrillator) present    Arthritis THUMBS   LBBB (left bundle branch block)    bilat. BBB- PPM   Peyronie's disease    Presence of permanent cardiac pacemaker    Past Surgical History:  Procedure Laterality Date   CARDIOVASCULAR STRESS TEST  10-18-2002  DR  CRENSHAW   NORMAL ADENOSINE CARDIOLITE/ EF 60%   CARDIOVERSION N/A 08/18/2020   Procedure: CARDIOVERSION;  Surgeon: Geralynn Rile, MD;  Location: [place];  Service: Cardiovascular;  Laterality: N/A;   CARDIOVERSION N/A 11/15/2020   Procedure: CARDIOVERSION;  Surgeon: Sueanne Margarita, MD;  Location: [facility];  Service: Cardiovascular;  Laterality: N/A;   CATARACT EXTRACTION W/ INTRAOCULAR LENS IMPLANT  2006   LEFT EYE   EP IMPLANTABLE DEVICE N/A 10/18/2015   Procedure: BiV Pacemaker Insertion CRT-P;  Surgeon: [name], MD;  Location: [place] [facility];  Service: Cardiovascular;  Laterality: N/A;   INGUINAL HERNIA REPAIR Right 02/23/2019   Procedure: RIGHT INGUINAL HERNIA REPAIR WITH MESH;  Surgeon: Erroll Luna, MD;  Location: [facility];  Service: General;  Laterality: Right;   LAPAROSCOPIC CHOLECYSTECTOMY  10-09-2007   LEFT HEART CATH AND CORONARY ANGIOGRAPHY N/A 01/30/2018   Procedure: LEFT HEART CATH AND CORONARY ANGIOGRAPHY;  Surgeon: Belva Crome, MD;  Location: [place] [facility];  Service: Cardiovascular;  Laterality: N/A;   NESBIT PROCEDURE  01/17/2012   Procedure: NESBIT PROCEDURE;  Surgeon: [name], MD;  Location: [facility];  Service: Urology;  Laterality: N/A;  16 dot plication    REPAIR DETACHED RETINA, LEFT EYE  1993   REPAIR LEFT INGUINAL HERNIA  2005   Family History  Problem Relation Age of Onset    Diabetes Mother    Hypertension Mother    Hyperlipidemia Mother    Heart disease Father    Hyperlipidemia Father    Hypertension Father    Arrhythmia Father        paf   Healthy Daughter    Healthy Son    Heart disease Paternal Grandfather    Heart attack Paternal Grandfather    Social History   Socioeconomic History   Marital status: Married    Spouse name: Not on file   Number of children: Not on file   Years of education: Not on file   Highest education level: Not on file  Occupational History   Not on file  Tobacco Use   Smoking status: Never   Smokeless tobacco: Never  Vaping Use   Vaping Use: Never used  Substance and Sexual Activity   Alcohol use: No   Drug use:  No   Sexual activity: Yes  Other Topics Concern   Not on file  Social History Narrative   Right handed    Lives with wife   Social Determinants of Health   Financial Resource Strain: Not on file  Food Insecurity: Not on file  Transportation Needs: Not on file  Physical Activity: Not on file  Stress: Not on file  Social Connections: Not on file    Tobacco Counseling Counseling given: Not Answered   Diabetic?no      Activities of Daily Living In your present state of health, do you have any difficulty performing the following activities: 12/13/2020 12/12/2020  Hearing? N N  Vision? N N  Difficulty concentrating or making decisions? Y N  Walking or climbing stairs? N N  Dressing or bathing? N N  Doing errands, shopping? N N  Some recent data might be hidden    Patient Care Team: Lorrene Reid, PA-C as PCP - General [name], MD as PCP - Cardiology (Cardiology) [name], MD as PCP - Electrophysiology (Cardiology) Wonda Horner, MD as Consulting Physician (Gastroenterology) Allyn Kenner, MD as Consulting Physician (Dermatology) Pa, [place] as Consulting Physician (Optometry) [name], MD as Consulting Physician (Cardiology) Cameron Sprang, MD as  Consulting Physician (Neurology)  Indicate any recent Medical Services you may have received from other than [facility] providers in the past year (date may be approximate).     Assessment:   This is a routine wellness examination for Tonya.  Hearing/Vision screen No results found.  Dietary issues and exercise activities discussed: -Increase physical activity as advised by cardiology. Follow low fat diet.   Goals Addressed   None   Depression Screen PHQ 2/9 Scores 12/13/2020 11/14/2020 08/21/2020 08/01/2020 03/08/2020 01/21/2020 01/14/2019  PHQ - 2 Score 0 1 0 0 0 0 0  PHQ- 9 Score 1 1 2 1  0 2 1    Fall Risk Fall Risk  12/13/2020 11/14/2020 08/21/2020 08/01/2020 03/08/2020  Falls in the past year? 1 1 1 1 1   Number falls in past yr: 0 0 0 0 1  Injury with Fall? 0 0 0 0 0  Risk for fall due to : No Fall Risks No Fall Risks No Fall Risks - -  Follow up Falls evaluation completed Falls evaluation completed Falls evaluation completed Falls evaluation completed Falls evaluation completed    FALL RISK PREVENTION PERTAINING TO THE HOME:  Any stairs in or around the home? Yes  If so, are there any without handrails? No  Home free of loose throw rugs in walkways, pet beds, electrical cords, etc? No  Adequate lighting in your home to reduce risk of falls? Yes   ASSISTIVE DEVICES UTILIZED TO PREVENT FALLS:  Life alert? No  Use of a cane, walker or w/c? No  Grab bars in the bathroom? No  Shower chair or bench in shower? Yes  Elevated toilet seat or a handicapped toilet? No   TIMED UP AND GO:  Was the test performed? No . Telehealth appointment  Length of time to ambulate 10 feet: 0 sec.     Cognitive Function: stable MMSE - Mini Mental State Exam 01/21/2020  Orientation to time 5  Orientation to Place 5  Registration 3  Attention/ Calculation 5  Recall 2  Language- name 2 objects 2  Language- repeat 1  Language- follow 3 step command 3  Language- read & follow direction 1  Write a  sentence 1  Copy  design 1  Total score 29   Montreal Cognitive Assessment  02/08/2020  Visuospatial/ Executive (0/5) 4  Naming (0/3) 3  Attention: Read list of digits (0/2) 2  Attention: Read list of letters (0/1) 1  Attention: Serial 7 subtraction starting at 100 (0/3) 3  Language: Repeat phrase (0/2) 1  Language : Fluency (0/1) 1  Abstraction (0/2) 0  Delayed Recall (0/5) 3  Orientation (0/6) 6  Total 24  Adjusted Score (based on education) 25   6CIT Screen 12/13/2020 03/08/2020 01/21/2020  What Year? 0 points 0 points 0 points  What month? 0 points 0 points 0 points  What time? 0 points 0 points 0 points  Count back from 20 0 points 0 points 0 points  Months in reverse 0 points 0 points 0 points  Repeat phrase 2 points 2 points 0 points  Total Score 2 2 0    Immunizations Immunization History  Administered Date(s) Administered   PFIZER(Purple Top)SARS-COV-2 Vaccination 05/11/2019, 06/01/2019, 12/28/2019   Td 03/18/2006   Tdap 11/13/2016    TDAP status: Up to date  Flu Vaccine status: Due, Education has been provided regarding the importance of this vaccine. Advised may receive this vaccine at local pharmacy or Health Dept. Aware to provide a copy of the vaccination record if obtained from local pharmacy or Health Dept. Verbalized acceptance and understanding.  Pneumococcal vaccine status: Due, Education has been provided regarding the importance of this vaccine. Advised may receive this vaccine at local pharmacy or Health Dept. Aware to provide a copy of the vaccination record if obtained from local pharmacy or Health Dept. Verbalized acceptance and understanding.  Covid-19 vaccine status: Completed vaccines  Qualifies for Shingles Vaccine? Yes   Zostavax completed No   Shingrix Completed?: No.    Education has been provided regarding the importance of this vaccine. Patient has been advised to call insurance company to determine out of pocket expense if they have not  yet received this vaccine. Advised may also receive vaccine at local pharmacy or Health Dept. Verbalized acceptance and understanding.  Screening Tests Health Maintenance  Topic Date Due   Zoster Vaccines- Shingrix (1 of 2) Never done   COVID-19 Vaccine (4 - Booster for Pfizer series) 03/21/2020   INFLUENZA VACCINE  Never done   Hepatitis C Screening  11/11/2028 (Originally 02/09/1969)   COLONOSCOPY (Pts 45-62yrs Insurance coverage will need to be confirmed)  02/04/2026   TETANUS/TDAP  11/14/2026   HPV VACCINES  Aged Out    Health Maintenance  Health Maintenance Due  Topic Date Due   Zoster Vaccines- Shingrix (1 of 2) Never done   COVID-19 Vaccine (4 - Booster for Pfizer series) 03/21/2020   INFLUENZA VACCINE  Never done    Colorectal cancer screening: Type of screening: Colonoscopy. Completed 02/26/2016. Repeat every 5 years  Lung Cancer Screening: (Low Dose CT Chest recommended if Age 29-80 years, 30 pack-year currently smoking OR have quit w/in 15years.) does not qualify.   Lung Cancer Screening Referral: n/a  Additional Screening:  Hepatitis C Screening: does qualify;Not yet completed.   Vision Screening: Recommended annual ophthalmology exams for early detection of glaucoma and other disorders of the eye. Is the patient up to date with their annual eye exam?  Yes  Who is the provider or what is the name of the office in which the patient attends annual eye exams? [facility], appt scheduled in Jan 2023 If pt is not established with a provider, would they like to be  referred to a provider to establish care? No .   Dental Screening: Recommended annual dental exams for proper oral hygiene  Community Resource Referral / Chronic Care Management: CRR required this visit?  No   CCM required this visit?  No      Plan:  -Patient currently admitted for medical treatment of A-fib. -Continue to follow up with various specialists.  -Follow up in 3 months for HTN,  HLD, mood  I have personally reviewed and noted the following in the patient's chart:   Medical and social history Use of alcohol, tobacco or illicit drugs  Current medications and supplements including opioid prescriptions. Patient is not currently taking opioid prescriptions. Functional ability and status Nutritional status Physical activity Advanced directives List of other physicians Hospitalizations, surgeries, and ER visits in previous 12 months Vitals Screenings to include cognitive, depression, and falls Referrals and appointments  In addition, I have reviewed and discussed with patient certain preventive protocols, quality metrics, and best practice recommendations. A written personalized care plan for preventive services as well as general preventive health recommendations were provided to patient.     Lorrene Reid, PA-C   12/13/2020

## 2020-12-13 NOTE — Progress Notes (Signed)
   12/13/20 1000  Clinical Encounter Type  Visited With Patient  Visit Type Initial;Other (Comment) (Advanced Directive)  Referral From Patient;Nurse  Advance Directives (For Healthcare)  Does Patient Have a Medical Advance Directive? No   CH visited pt. per [facility] consult for assistance with AD education/completion.  Pt. sitting by window in recliner; he says he is interested in completing AD per conversation w/RN and his wife yesterday.  [place] provided AD form and explanation of contents.  Pt. will read over the document and contact chaplains if he has questions or needs assistance coordinating notarization.  Lindaann Pascal, Chaplain Pager: [phone]

## 2020-12-14 DIAGNOSIS — I4819 Other persistent atrial fibrillation: Secondary | ICD-10-CM | POA: Diagnosis not present

## 2020-12-14 LAB — BASIC METABOLIC PANEL
Anion gap: 6 (ref 5–15)
BUN: 9 mg/dL (ref 8–23)
CO2: 28 mmol/L (ref 22–32)
Calcium: 9.4 mg/dL (ref 8.9–10.3)
Chloride: 104 mmol/L (ref 98–111)
Creatinine, Ser: 0.74 mg/dL (ref 0.61–1.24)
GFR, Estimated: >60 mL/min (ref >60)
Glucose, Bld: 114 mg/dL — ABNORMAL HIGH (ref 70–99)
Potassium: 4.1 mmol/L (ref 3.5–5.1)
Sodium: 138 mmol/L (ref 135–145)

## 2020-12-14 LAB — MAGNESIUM: Magnesium: 2 mg/dL (ref 1.7–2.4)

## 2020-12-14 MED ORDER — MAGNESIUM SULFATE 2 GM/50ML IV SOLN
2.0000 g | Freq: Once | INTRAVENOUS | Status: AC
  Administered 2020-12-14: 2 g via INTRAVENOUS
  Filled 2020-12-14: qty 50

## 2020-12-14 MED ORDER — SPIRONOLACTONE 25 MG PO TABS
25.0000 mg | ORAL_TABLET | Freq: Every day | ORAL | Status: DC
  Administered 2020-12-14 – 2020-12-15 (×2): 25 mg via ORAL
  Filled 2020-12-14 (×2): qty 1

## 2020-12-14 NOTE — Care Management (Signed)
1022 12-14-20 Case Manager spoke with the patient regarding Tikosyn cost. $100 is a little much for the patient. Case Manager discussed adding the Good Rx App to his phone. Patient wants his first Rx fill via [facility] and refills sent to [facility]. He will monitor the Good  Rx App to see which pharmacy is cheaper. No further needs identified.

## 2020-12-14 NOTE — Progress Notes (Signed)
Pharmacy: Dofetilide (Tikosyn) - Follow Up Assessment and Electrolyte Replacement  Pharmacy consulted to assist in monitoring and replacing electrolytes in this 70 y.o. male admitted on 12/12/2020 undergoing dofetilide initiation.   Labs:    Component Value Date/Time   K 4.1 12/14/2020 0313   MG 2.0 12/14/2020 0313     Plan: Potassium: K >/= 4: No additional supplementation needed  Magnesium: Mg 1.8-2: Give Mg 2 gm IV x1    Thank you for allowing pharmacy to participate in this patient's care   Hildred Laser, PharmD Clinical Pharmacist **Pharmacist phone directory can now be found on [email or web address] ([email or web address]).  Listed under [place].

## 2020-12-14 NOTE — Progress Notes (Signed)
Progress Note  Patient Name: George [name] Date of Encounter: 12/14/2020  [facility] Cardiologist: Virl Axe, MD   Subjective   Without complaints    Inpatient Medications    Scheduled Meds:  apixaban  5 mg Oral BID   atorvastatin  40 mg Oral QHS   carvedilol  25 mg Oral BID WC   dofetilide  500 mcg Oral BID   furosemide  20 mg Oral Daily   losartan  12.5 mg Oral Daily   sodium chloride flush  3 mL Intravenous Q12H   tamsulosin  0.4 mg Oral Daily   Continuous Infusions:  sodium chloride     PRN Meds: sodium chloride, fluticasone, sodium chloride flush   Vital Signs    Vitals:   12/13/20 1440 12/13/20 1759 12/13/20 1940 12/14/20 0501  BP: 106/65 133/77 108/69 125/70  Pulse: 61  61 70  Resp: 16  17 16   Temp: 98 F (36.7 C)  98 F (36.7 C) 98 F (36.7 C)  TempSrc: Oral  Oral Oral  SpO2: 99%  99% 99%  Weight:      Height:       No intake or output data in the 24 hours ending 12/14/20 0703  Last 3 Weights 12/13/2020 12/12/2020 12/11/2020  Weight (lbs) 158 lb 158 lb 11.2 oz 154 lb 12.8 oz  Weight (kg) 71.668 kg 71.986 kg 70.217 kg      Telemetry    Vpacing  ECG   QT about 500 but with QRS of 150   Physical Exam  Well developed and nourished in no acute distress HENT normal Neck supple with JVP-  flat   Clear Regular rate and rhythm, no murmurs or gallops Abd-soft with active BS No Clubbing cyanosis edema Skin-warm and dry A & Oriented  Grossly normal sensory and motor function     Labs    High Sensitivity Troponin:  No results for input(s): TROPONINIHS in the last 720 hours.   Chemistry Recent Labs  Lab 12/12/20 0953 12/13/20 0244 12/14/20 0313  NA 138 137 138  K 4.1 3.8 4.1  CL 103 103 104  CO2 29 30 28   GLUCOSE 112* 106* 114*  BUN 9 9 9   CREATININE 0.79 0.71 0.74  CALCIUM 9.3 9.1 9.4  MG 1.8 2.1 2.0  GFRNONAA >60 >60 >60  ANIONGAP 6 4* 6     Lipids No results for input(s): CHOL, TRIG, HDL, LABVLDL, LDLCALC, CHOLHDL in  the last 168 hours.  HematologyNo results for input(s): WBC, RBC, HGB, HCT, MCV, MCH, MCHC, RDW, PLT in the last 168 hours. Thyroid No results for input(s): TSH, FREET4 in the last 168 hours.  BNPNo results for input(s): BNP, PROBNP in the last 168 hours.  DDimer No results for input(s): DDIMER in the last 168 hours.   Radiology    No results found.  Cardiac Studies   Echo 06/05/20  1. Inferior wall hypokinesis . Left ventricular ejection fraction, by  estimation, is 50 to 55%. The left ventricle has low normal function. The  left ventricle has no regional wall motion abnormalities. The left  ventricular internal cavity size was  mildly dilated. There is mild asymmetric left ventricular hypertrophy of  the basal and septal segments. Left ventricular diastolic parameters are  indeterminate.   2. Pacing wire in RA/RV . Right ventricular systolic function is normal.  The right ventricular size is normal.   3. Left atrial size was mildly dilated.   4. Right atrial size was  mildly dilated.   5. The mitral valve is abnormal. Mild mitral valve regurgitation. No  evidence of mitral stenosis.   6. The aortic valve is abnormal. Aortic valve regurgitation is mild. No  aortic stenosis is present.   7. The inferior vena cava is normal in size with greater than 50%  respiratory variability, suggesting right atrial pressure of 3 mmHg.    Patient Profile     70 y.o. male  history of HTN, NICM w/recovered LVEF,  2nd degree AVblock,II, s/p CRT-P, ATach, NSVT, AFib admitted for Tikosyn admission   Device information:  MDT CRT-P, implanted 10/18/15, Dr. Caryl Comes  Assessment & Plan    Persistent AFib CHA2DS2Vasc is 3, on Eliquis Tikosyn load is in progress K+4.1 Mag 2.1 Creat 0.71    2. HLD Home meds  3. NICM Recovered LVEF Home regime No symptoms or signs of volume OL      Anticipate discharge in am Will stop lasix and use aldactone    Signed, Virl Axe, MD  12/14/2020, 7:03  AM

## 2020-12-14 NOTE — Progress Notes (Signed)
Post dose EKG is reviewed SR/V paced rhythm 58bpm Manually measured QT 4103ms, QTC 42ms, shorter even considering paced QRS 125ms  OK to continue Tikosyn  Tommye Standard, PA-C

## 2020-12-14 NOTE — Discharge Summary (Addendum)
ELECTROPHYSIOLOGY PROCEDURE DISCHARGE SUMMARY    Patient ID: George [name],  MRN: [id number], DOB/AGE: 20-Jul-1950 70 y.o.  Admit date: 12/12/2020 Discharge date: 12/15/20  Primary Care Physician: Lorrene Reid, PA-C  Primary Cardiologist/Electrophysiologist: Dr. Caryl Comes  Primary Discharge Diagnosis:  1.  persistent atrial fibrillation status post Tikosyn loading this admission      CHA2DS2Vasc is 3, on Eliquis  Secondary Discharge Diagnosis:  HTN NICM Recovered LVEF Advanced heart block W/CRT-P ATach NSVT  No Known Allergies   Procedures This Admission:  1.  Tikosyn loading   Brief HPI: George [name] is a 70 y.o. male with a past medical history as noted above.  FOllowed by EP out patient.  AFib management options discussed.  Risks, benefits, and alternatives to Tikosyn were reviewed with the patient who wished to proceed.    Hospital Course:  The patient was admitted and Tikosyn was initiated.  Renal function and electrolytes were followed during the hospitalization.  The patient's QTc remained stable. He arrived in SR, did not require DCCV.  The patient was monitored until discharge on telemetry which demonstrated SR/V paced rhythm.  On the day of discharge, he feels well, was examined by Dr Caryl Comes who considered the patient stable for discharge to home.  Follow-up has been arranged with the AFib clinic in 1 week and with EP service in 4 weeks.   Tikosyn teaching was completed Electrolyte replacement for home d/w Dr. Caryl Comes, stop lasix, start spironolactone, keep current K+ and repeat labs at next week's appt as usual  TOC for initial fill >> [facility]   Physical Exam: Vitals:   12/14/20 1321 12/14/20 1600 12/14/20 2230 12/15/20 0418  BP: 132/75 120/75 129/85 134/68  Pulse: 60  60 61  Resp:  18  17  Temp: 97.7 F (36.5 C)  98.2 F (36.8 C) 98.6 F (37 C)  TempSrc: Oral  Oral Oral  SpO2:   97% 98%  Weight:      Height:         GEN- The patient  is well appearing, alert and oriented x 3 today.   HEENT: normocephalic, atraumatic; sclera clear, conjunctiva pink; hearing intact; oropharynx clear; neck supple, no JVP Lymph- no cervical lymphadenopathy Lungs-  CTA b/l, normal work of breathing.  No wheezes, rales, rhonchi Heart- RRR, no murmurs, rubs or gallops, PMI not laterally displaced GI- soft, non-tender, non-distended Extremities- no clubbing, cyanosis, or edema MS- no significant deformity or atrophy Skin- warm and dry, no rash or lesion Psych- euthymic mood, full affect Neuro- strength and sensation are intact   Labs:   Lab Results  Component Value Date   WBC 7.4 11/15/2020   HGB 14.6 11/15/2020   HCT 43.0 11/15/2020   MCV 89 11/15/2020   PLT 186 11/15/2020    Recent Labs  Lab 12/15/20 0240  NA 136  K 3.6  CL 102  CO2 28  BUN 12  CREATININE 0.79  CALCIUM 9.1  GLUCOSE 116*     Discharge Medications:  Allergies as of 12/15/2020   No Known Allergies      Medication List     STOP taking these medications    furosemide 20 MG tablet Commonly known as: LASIX       TAKE these medications    atorvastatin 40 MG tablet Commonly known as: LIPITOR TAKE 1 TABLET BY MOUTH EVERY DAY AT 6PM What changed: See the new instructions.   carvedilol 25 MG tablet Commonly known as: COREG TAKE  1 TABLET BY MOUTH TWICE A DAY WITH MEALS   Centrum Adults Tabs Take 1 tablet by mouth daily.   clotrimazole-betamethasone cream Commonly known as: LOTRISONE Apply 1 application topically 2 (two) times daily. What changed:  when to take this reasons to take this   dofetilide 500 MCG capsule Commonly known as: TIKOSYN Take 1 capsule (500 mcg total) by mouth 2 (two) times daily.   Eliquis 5 MG Tabs tablet Generic drug: apixaban TAKE 1 TABLET BY MOUTH TWICE A DAY What changed: how much to take   fexofenadine 180 MG tablet Commonly known as: ALLEGRA TAKE 1 TABLET (180 MG TOTAL) BY MOUTH DAILY. DURING ALLERGY  SEASON   fluticasone 50 MCG/ACT nasal spray Commonly known as: FLONASE USE 1 SPRAY IN EACH NOSTRIL AFTER SINUS RINSES TWICE DAILY. What changed: See the new instructions.   losartan 25 MG tablet Commonly known as: COZAAR Take 0.5 tablets (12.5 mg total) by mouth daily. What changed: how much to take   potassium chloride SA 20 MEQ tablet Commonly known as: KLOR-CON Take 1 tablet (20 mEq total) by mouth daily.   sildenafil 20 MG tablet Commonly known as: REVATIO Take 20-100 mg by mouth daily as needed (erectile disfunction).   spironolactone 25 MG tablet Commonly known as: ALDACTONE Take 1 tablet (25 mg total) by mouth daily. Start taking on: December 16, 2020   tamsulosin 0.4 MG Caps capsule Commonly known as: FLOMAX Take 0.4 mg by mouth daily.   Vitamin D3 125 MCG (5000 UT) Tabs 5,000 IU OTC vitamin D3 daily.        Disposition: Home Discharge Instructions     Diet - low sodium heart healthy   Complete by: As directed    Increase activity slowly   Complete by: As directed        Follow-up Information     [facility] Follow up.   Specialty: Cardiology Why: 12/22/20 @ 10:30AM with R. Fenton, PA-C Contact information: [address] [id number] mc [place] [phone]        Baldwin Jamaica, PA-C Follow up.   Specialty: Cardiology Why: 01/15/21 @ 10:45AM Contact information: [place] [address] [phone]                 Duration of Discharge Encounter: Greater than 30 minutes including physician time.  Venetia Night, PA-C 12/15/2020 12:12 PM

## 2020-12-15 ENCOUNTER — Other Ambulatory Visit (HOSPITAL_COMMUNITY): Payer: Self-pay

## 2020-12-15 ENCOUNTER — Other Ambulatory Visit: Payer: Self-pay

## 2020-12-15 DIAGNOSIS — Z0000 Encounter for general adult medical examination without abnormal findings: Secondary | ICD-10-CM

## 2020-12-15 DIAGNOSIS — I10 Essential (primary) hypertension: Secondary | ICD-10-CM

## 2020-12-15 DIAGNOSIS — I472 Ventricular tachycardia: Secondary | ICD-10-CM | POA: Diagnosis not present

## 2020-12-15 DIAGNOSIS — I4819 Other persistent atrial fibrillation: Secondary | ICD-10-CM | POA: Diagnosis not present

## 2020-12-15 DIAGNOSIS — E785 Hyperlipidemia, unspecified: Secondary | ICD-10-CM

## 2020-12-15 DIAGNOSIS — R0681 Apnea, not elsewhere classified: Secondary | ICD-10-CM | POA: Diagnosis not present

## 2020-12-15 DIAGNOSIS — I441 Atrioventricular block, second degree: Secondary | ICD-10-CM | POA: Diagnosis not present

## 2020-12-15 DIAGNOSIS — I428 Other cardiomyopathies: Secondary | ICD-10-CM | POA: Diagnosis not present

## 2020-12-15 DIAGNOSIS — Z95810 Presence of automatic (implantable) cardiac defibrillator: Secondary | ICD-10-CM | POA: Diagnosis not present

## 2020-12-15 DIAGNOSIS — Z79899 Other long term (current) drug therapy: Secondary | ICD-10-CM | POA: Diagnosis not present

## 2020-12-15 DIAGNOSIS — Z7901 Long term (current) use of anticoagulants: Secondary | ICD-10-CM | POA: Diagnosis not present

## 2020-12-15 DIAGNOSIS — D6869 Other thrombophilia: Secondary | ICD-10-CM | POA: Diagnosis not present

## 2020-12-15 LAB — BASIC METABOLIC PANEL
Anion gap: 6 (ref 5–15)
BUN: 12 mg/dL (ref 8–23)
CO2: 28 mmol/L (ref 22–32)
Calcium: 9.1 mg/dL (ref 8.9–10.3)
Chloride: 102 mmol/L (ref 98–111)
Creatinine, Ser: 0.79 mg/dL (ref 0.61–1.24)
GFR, Estimated: >60 mL/min (ref >60)
Glucose, Bld: 116 mg/dL — ABNORMAL HIGH (ref 70–99)
Potassium: 3.6 mmol/L (ref 3.5–5.1)
Sodium: 136 mmol/L (ref 135–145)

## 2020-12-15 LAB — MAGNESIUM: Magnesium: 2.3 mg/dL (ref 1.7–2.4)

## 2020-12-15 MED ORDER — POTASSIUM CHLORIDE CRYS ER 20 MEQ PO TBCR
40.0000 meq | EXTENDED_RELEASE_TABLET | Freq: Once | ORAL | Status: DC
  Filled 2020-12-15: qty 2

## 2020-12-15 MED ORDER — DOFETILIDE 500 MCG PO CAPS
500.0000 ug | ORAL_CAPSULE | Freq: Two times a day (BID) | ORAL | 5 refills | Status: DC
  Filled 2020-12-15: qty 60, 30d supply, fill #0

## 2020-12-15 MED ORDER — POTASSIUM CHLORIDE CRYS ER 20 MEQ PO TBCR
60.0000 meq | EXTENDED_RELEASE_TABLET | Freq: Once | ORAL | Status: AC
  Administered 2020-12-15: 60 meq via ORAL
  Filled 2020-12-15: qty 3

## 2020-12-15 MED ORDER — SPIRONOLACTONE 25 MG PO TABS
25.0000 mg | ORAL_TABLET | Freq: Every day | ORAL | 5 refills | Status: DC
  Filled 2020-12-15: qty 30, 30d supply, fill #0

## 2020-12-15 NOTE — Progress Notes (Signed)
Pharmacy: Dofetilide (Tikosyn) - Follow Up Assessment and Electrolyte Replacement  Pharmacy consulted to assist in monitoring and replacing electrolytes in this 70 y.o. male admitted on 12/12/2020 undergoing dofetilide initiation.   Labs:    Component Value Date/Time   K 3.6 12/15/2020 0240   MG 2.3 12/15/2020 0240     Plan: Potassium: K 3.5-3.7:  Give KCl 60 mEq po x1   Magnesium: Mg > 2: No additional supplementation needed   Thank you for allowing pharmacy to participate in this patient's care   Erin Hearing PharmD., BCPS Clinical Pharmacist 12/15/2020 8:17 AM

## 2020-12-15 NOTE — Care Management Important Message (Signed)
Important Message  Patient Details  Name: George [name] MRN: [id number] Date of Birth: 06/05/1950   Medicare Important Message Given:  Yes     Shelda Altes 12/15/2020, 10:00 AM

## 2020-12-15 NOTE — Plan of Care (Signed)
  Problem: Education: Goal: Knowledge of disease or condition will improve Outcome: Adequate for Discharge Goal: Understanding of medication regimen will improve Outcome: Adequate for Discharge Goal: Individualized Educational Video(s) Outcome: Adequate for Discharge   Problem: Activity: Goal: Ability to tolerate increased activity will improve Outcome: Adequate for Discharge   Problem: Cardiac: Goal: Ability to achieve and maintain adequate cardiopulmonary perfusion will improve Outcome: Adequate for Discharge   Problem: Health Behavior/Discharge Planning: Goal: Ability to safely manage health-related needs after discharge will improve Outcome: Adequate for Discharge   Problem: Education: Goal: Knowledge of General Education information will improve Description: Including pain rating scale, medication(s)/side effects and non-pharmacologic comfort measures Outcome: Adequate for Discharge   Problem: Health Behavior/Discharge Planning: Goal: Ability to manage health-related needs will improve Outcome: Adequate for Discharge

## 2020-12-18 ENCOUNTER — Other Ambulatory Visit: Payer: Medicare HMO

## 2020-12-18 ENCOUNTER — Other Ambulatory Visit: Payer: Self-pay

## 2020-12-18 DIAGNOSIS — I10 Essential (primary) hypertension: Secondary | ICD-10-CM | POA: Diagnosis not present

## 2020-12-18 DIAGNOSIS — E559 Vitamin D deficiency, unspecified: Secondary | ICD-10-CM | POA: Diagnosis not present

## 2020-12-18 DIAGNOSIS — R5383 Other fatigue: Secondary | ICD-10-CM | POA: Diagnosis not present

## 2020-12-18 DIAGNOSIS — E538 Deficiency of other specified B group vitamins: Secondary | ICD-10-CM | POA: Diagnosis not present

## 2020-12-18 DIAGNOSIS — E785 Hyperlipidemia, unspecified: Secondary | ICD-10-CM

## 2020-12-18 DIAGNOSIS — Z0000 Encounter for general adult medical examination without abnormal findings: Secondary | ICD-10-CM | POA: Diagnosis not present

## 2020-12-19 LAB — LIPID PANEL
Chol/HDL Ratio: 2.5 ratio (ref 0.0–5.0)
Cholesterol, Total: 112 mg/dL (ref 100–199)
HDL: 45 mg/dL (ref >39)
LDL Chol Calc (NIH): 53 mg/dL (ref 0–99)
Triglycerides: 62 mg/dL (ref 0–149)
VLDL Cholesterol Cal: 14 mg/dL (ref 5–40)

## 2020-12-19 LAB — HEMOGLOBIN A1C
Est. average glucose Bld gHb Est-mCnc: 123 mg/dL
Hgb A1c MFr Bld: 5.9 % — ABNORMAL HIGH (ref 4.8–5.6)

## 2020-12-19 LAB — COMPREHENSIVE METABOLIC PANEL
ALT: 33 IU/L (ref 0–44)
AST: 28 IU/L (ref 0–40)
Albumin/Globulin Ratio: 1.7 (ref 1.2–2.2)
Albumin: 4.3 g/dL (ref 3.8–4.8)
Alkaline Phosphatase: 128 IU/L — ABNORMAL HIGH (ref 44–121)
BUN/Creatinine Ratio: 15 (ref 10–24)
BUN: 12 mg/dL (ref 8–27)
Bilirubin Total: 0.9 mg/dL (ref 0.0–1.2)
CO2: 26 mmol/L (ref 20–29)
Calcium: 9.3 mg/dL (ref 8.6–10.2)
Chloride: 100 mmol/L (ref 96–106)
Creatinine, Ser: 0.8 mg/dL (ref 0.76–1.27)
Globulin, Total: 2.5 g/dL (ref 1.5–4.5)
Glucose: 109 mg/dL — ABNORMAL HIGH (ref 70–99)
Potassium: 4.6 mmol/L (ref 3.5–5.2)
Sodium: 137 mmol/L (ref 134–144)
Total Protein: 6.8 g/dL (ref 6.0–8.5)
eGFR: 97 mL/min/{1.73_m2} (ref >59)

## 2020-12-19 LAB — CBC WITH DIFFERENTIAL/PLATELET
Basophils Absolute: 0.1 10*3/uL (ref 0.0–0.2)
Basos: 1 %
EOS (ABSOLUTE): 0.3 10*3/uL (ref 0.0–0.4)
Eos: 4 %
Hematocrit: 40.1 % (ref 37.5–51.0)
Hemoglobin: 14.1 g/dL (ref 13.0–17.7)
Immature Grans (Abs): 0.1 10*3/uL (ref 0.0–0.1)
Immature Granulocytes: 1 %
Lymphocytes Absolute: 2.5 10*3/uL (ref 0.7–3.1)
Lymphs: 36 %
MCH: 31.1 pg (ref 26.6–33.0)
MCHC: 35.2 g/dL (ref 31.5–35.7)
MCV: 89 fL (ref 79–97)
Monocytes Absolute: 0.6 10*3/uL (ref 0.1–0.9)
Monocytes: 9 %
Neutrophils Absolute: 3.4 10*3/uL (ref 1.4–7.0)
Neutrophils: 49 %
Platelets: 190 10*3/uL (ref 150–450)
RBC: 4.53 x10E6/uL (ref 4.14–5.80)
RDW: 12.1 % (ref 11.6–15.4)
WBC: 6.9 10*3/uL (ref 3.4–10.8)

## 2020-12-19 LAB — TSH: TSH: 1.01 u[IU]/mL (ref 0.450–4.500)

## 2020-12-22 ENCOUNTER — Ambulatory Visit (HOSPITAL_COMMUNITY)
Admit: 2020-12-22 | Discharge: 2020-12-22 | Disposition: A | Discharge status: Home / Self Care | Payer: Medicare HMO | Source: Ambulatory Visit | Attending: Physician Assistant | Admitting: Physician Assistant

## 2020-12-22 ENCOUNTER — Other Ambulatory Visit: Payer: Self-pay

## 2020-12-22 ENCOUNTER — Encounter (HOSPITAL_COMMUNITY): Payer: Self-pay | Admitting: Physician Assistant

## 2020-12-22 VITALS — BP 106/70 | HR 66 | Ht 68.0 in | Wt 158.6 lb

## 2020-12-22 DIAGNOSIS — Z8249 Family history of ischemic heart disease and other diseases of the circulatory system: Secondary | ICD-10-CM | POA: Diagnosis not present

## 2020-12-22 DIAGNOSIS — I441 Atrioventricular block, second degree: Secondary | ICD-10-CM | POA: Insufficient documentation

## 2020-12-22 DIAGNOSIS — Z7901 Long term (current) use of anticoagulants: Secondary | ICD-10-CM | POA: Insufficient documentation

## 2020-12-22 DIAGNOSIS — R0683 Snoring: Secondary | ICD-10-CM | POA: Insufficient documentation

## 2020-12-22 DIAGNOSIS — D6869 Other thrombophilia: Secondary | ICD-10-CM | POA: Diagnosis not present

## 2020-12-22 DIAGNOSIS — Z950 Presence of cardiac pacemaker: Secondary | ICD-10-CM | POA: Insufficient documentation

## 2020-12-22 DIAGNOSIS — I2510 Atherosclerotic heart disease of native coronary artery without angina pectoris: Secondary | ICD-10-CM | POA: Insufficient documentation

## 2020-12-22 DIAGNOSIS — I4819 Other persistent atrial fibrillation: Secondary | ICD-10-CM | POA: Insufficient documentation

## 2020-12-22 LAB — BASIC METABOLIC PANEL
Anion gap: 6 (ref 5–15)
BUN: 13 mg/dL (ref 8–23)
CO2: 28 mmol/L (ref 22–32)
Calcium: 9.6 mg/dL (ref 8.9–10.3)
Chloride: 102 mmol/L (ref 98–111)
Creatinine, Ser: 0.85 mg/dL (ref 0.61–1.24)
GFR, Estimated: >60 mL/min (ref >60)
Glucose, Bld: 84 mg/dL (ref 70–99)
Potassium: 4.6 mmol/L (ref 3.5–5.1)
Sodium: 136 mmol/L (ref 135–145)

## 2020-12-22 LAB — MAGNESIUM: Magnesium: 2 mg/dL (ref 1.7–2.4)

## 2020-12-22 NOTE — Progress Notes (Signed)
Primary Care Physician: Lorrene Reid, PA-C Primary Electrophysiologist: Dr Caryl Comes Referring Physician: Device clinic/Dr Lillia Dallas George [name] is a 70 y.o. male with a history of HTN, CAD, 2nd degree AV block II s/p CRT-P, ATach, NSVT, atrial fibrillation who presents for follow up in the [facility]. The patient was initially diagnosed with atrial fibrillation 07/20/20 after the device clinic received an alert for an ongoing episode. Patient has a CHADS2VASC score of 3. He denies palpitations but noted more fatigue. There were no specific triggers that he could identify. Patient was seen in follow up on 11/03/20 and was back in afib. He underwent repeat DCCV on 11/15/20.   On follow up today, patient is s/p dofetilide loading 9/28-9/30/22. Patient presented in [place] and did not require DCCV. He reports that he feels he has more energy. He is tolerating the medication without issue.   Today, he denies symptoms of palpitations, chest pain, shortness of breath, orthopnea, PND, lower extremity edema, dizziness, presyncope, syncope, bleeding, or neurologic sequela. The patient is tolerating medications without difficulties and is otherwise without complaint today.    Atrial Fibrillation Risk Factors:  he does not have symptoms or diagnosis of sleep apnea. he does not have a history of rheumatic fever. he does not have a history of alcohol use. The patient does have a history of early familial atrial fibrillation or other arrhythmias. Father has afib.  he has a BMI of Body mass index is 24.12 kg/m.Marland Kitchen Filed Weights   12/22/20 1022  Weight: 71.9 kg       Family History  Problem Relation Age of Onset   Diabetes Mother    Hypertension Mother    Hyperlipidemia Mother    Heart disease Father    Hyperlipidemia Father    Hypertension Father    Arrhythmia Father        paf   Healthy Daughter    Healthy Son    Heart disease Paternal Grandfather    Heart attack  Paternal Grandfather      Atrial Fibrillation Management history:  Previous antiarrhythmic drugs: dofetilide  Previous cardioversions: 08/18/20, 11/15/20 Previous ablations: none CHADS2VASC score: 3 Anticoagulation history: Eliquis   Past Medical History:  Diagnosis Date   AICD (automatic cardioverter/defibrillator) present    Arthritis THUMBS   LBBB (left bundle branch block)    bilat. BBB- PPM   Peyronie's disease    Presence of permanent cardiac pacemaker    Past Surgical History:  Procedure Laterality Date   CARDIOVASCULAR STRESS TEST  10-18-2002  DR  CRENSHAW   NORMAL ADENOSINE CARDIOLITE/ EF 60%   CARDIOVERSION N/A 08/18/2020   Procedure: CARDIOVERSION;  Surgeon: Geralynn Rile, MD;  Location: [facility];  Service: Cardiovascular;  Laterality: N/A;   CARDIOVERSION N/A 11/15/2020   Procedure: CARDIOVERSION;  Surgeon: Sueanne Margarita, MD;  Location: [facility];  Service: Cardiovascular;  Laterality: N/A;   CATARACT EXTRACTION W/ INTRAOCULAR LENS IMPLANT  2006   LEFT EYE   EP IMPLANTABLE DEVICE N/A 10/18/2015   Procedure: BiV Pacemaker Insertion CRT-P;  Surgeon: [name], MD;  Location: [facility];  Service: Cardiovascular;  Laterality: N/A;   INGUINAL HERNIA REPAIR Right 02/23/2019   Procedure: RIGHT INGUINAL HERNIA REPAIR WITH MESH;  Surgeon: Erroll Luna, MD;  Location: [facility];  Service: General;  Laterality: Right;   LAPAROSCOPIC CHOLECYSTECTOMY  10-09-2007   LEFT HEART CATH AND CORONARY ANGIOGRAPHY N/A 01/30/2018   Procedure: LEFT HEART CATH  AND CORONARY ANGIOGRAPHY;  Surgeon: Belva Crome, MD;  Location: [facility];  Service: Cardiovascular;  Laterality: N/A;   NESBIT PROCEDURE  01/17/2012   Procedure: NESBIT PROCEDURE;  Surgeon: [name], MD;  Location: [facility];  Service: Urology;  Laterality: N/A;  16 dot plication    REPAIR DETACHED RETINA, LEFT EYE  1993   REPAIR LEFT INGUINAL HERNIA  2005     Current Outpatient Medications  Medication Sig Dispense Refill   atorvastatin (LIPITOR) 40 MG tablet TAKE 1 TABLET BY MOUTH EVERY DAY AT 6PM 90 tablet 2   carvedilol (COREG) 25 MG tablet TAKE 1 TABLET BY MOUTH TWICE A DAY WITH MEALS 180 tablet 2   Cholecalciferol (VITAMIN D3) 5000 units TABS 5,000 IU OTC vitamin D3 daily. 90 tablet 3   clotrimazole-betamethasone (LOTRISONE) cream Apply 1 application topically 2 (two) times daily. 45 g 0   dofetilide (TIKOSYN) 500 MCG capsule Take 1 capsule (500 mcg total) by mouth 2 (two) times daily. 60 capsule 5   ELIQUIS 5 MG TABS tablet TAKE 1 TABLET BY MOUTH TWICE A DAY 60 tablet 6   fexofenadine (ALLEGRA) 180 MG tablet TAKE 1 TABLET (180 MG TOTAL) BY MOUTH DAILY. DURING ALLERGY SEASON 90 tablet 1   fluticasone (FLONASE) 50 MCG/ACT nasal spray USE 1 SPRAY IN EACH NOSTRIL AFTER SINUS RINSES TWICE DAILY. 48 mL 1   losartan (COZAAR) 25 MG tablet Take 0.5 tablets (12.5 mg total) by mouth daily. 45 tablet 3   Multiple Vitamins-Minerals (CENTRUM ADULTS) TABS Take 1 tablet by mouth daily.     potassium chloride SA (KLOR-CON) 20 MEQ tablet Take 1 tablet (20 mEq total) by mouth daily. 30 tablet 0   sildenafil (REVATIO) 20 MG tablet Take 20-100 mg by mouth daily as needed (erectile disfunction).     spironolactone (ALDACTONE) 25 MG tablet Take 1 tablet (25 mg total) by mouth daily. 30 tablet 5   tamsulosin (FLOMAX) 0.4 MG CAPS capsule Take 0.4 mg by mouth daily.     No current facility-administered medications for this encounter.    No Known Allergies  Social History   Socioeconomic History   Marital status: Married    Spouse name: Not on file   Number of children: Not on file   Years of education: Not on file   Highest education level: Not on file  Occupational History   Not on file  Tobacco Use   Smoking status: Never   Smokeless tobacco: Never  Vaping Use   Vaping Use: Never used  Substance and Sexual Activity   Alcohol use: No   Drug use:  No   Sexual activity: Yes  Other Topics Concern   Not on file  Social History Narrative   Right handed    Lives with wife   Social Determinants of Health   Financial Resource Strain: Not on file  Food Insecurity: Not on file  Transportation Needs: Not on file  Physical Activity: Not on file  Stress: Not on file  Social Connections: Not on file  Intimate Partner Violence: Not on file     ROS- All systems are reviewed and negative except as per the HPI above.  Physical Exam: Vitals:   12/22/20 1022  BP: 106/70  Pulse: 66  Weight: 71.9 kg  Height: 5\' 8"  (1.727 m)    GEN- The patient is a well appearing male, alert and oriented x 3 today.   HEENT-head normocephalic, atraumatic, sclera clear, conjunctiva pink, hearing  intact, trachea midline. Lungs- Clear to ausculation bilaterally, normal work of breathing Heart- Regular rate and rhythm, no murmurs, rubs or gallops  GI- soft, NT, ND, + BS Extremities- no clubbing, cyanosis, or edema MS- no significant deformity or atrophy Skin- no rash or lesion Psych- euthymic mood, full affect Neuro- strength and sensation are intact   Wt Readings from Last 3 Encounters:  12/22/20 71.9 kg  12/12/20 72 kg  12/13/20 71.7 kg    EKG today demonstrates  AV dual paced rhythm Vent. rate 66 BPM PR interval 124 ms QRS duration 140 ms QT/QTcB 492/515 ms (500 ms manually acceptable with wide QRS)  Echo 06/05/20 demonstrated  1. Inferior wall hypokinesis . Left ventricular ejection fraction, by  estimation, is 50 to 55%. The left ventricle has low normal function. The left ventricle has no regional wall motion abnormalities. The left  ventricular internal cavity size was mildly dilated. There is mild asymmetric left ventricular hypertrophy of the basal and septal segments. Left ventricular diastolic parameters are indeterminate.   2. Pacing wire in RA/RV . Right ventricular systolic function is normal.  The right ventricular size is  normal.   3. Left atrial size was mildly dilated.   4. Right atrial size was mildly dilated.   5. The mitral valve is abnormal. Mild mitral valve regurgitation. No  evidence of mitral stenosis.   6. The aortic valve is abnormal. Aortic valve regurgitation is mild. No  aortic stenosis is present.   7. The inferior vena cava is normal in size with greater than 50%  respiratory variability, suggesting right atrial pressure of 3 mmHg.   Epic records are reviewed at length today  CHA2DS2-VASc Score = 3  The patient's score is based upon: CHF History: 0 HTN History: 1 Diabetes History: 0 Stroke History: 0 Vascular Disease History: 1 Age Score: 1 Gender Score: 0      ASSESSMENT AND PLAN: 1. Persistent atrial fibrillation  The patient's CHA2DS2-VASc score is 3, indicating a 3.2% annual risk of stroke.   S/p dofetilide loading 9/27-9/30/22 Patient appears to be maintaining SR. Continue dofetilide 500 mcg BID. QT stable. Check bmet/mag today.  Continue Eliquis 5 mg BID Continue Coreg 25 mg BID  2. Secondary Hypercoagulable State (ICD10:  D68.69) The patient is at significant risk for stroke/thromboembolism based upon his CHA2DS2-VASc Score of 3.  Continue Apixaban (Eliquis).   3. 2nd degree AV block type II S/p PPM, followed by Dr Caryl Comes and the device clinic.  4. CAD Nonobstructive on LHC 2019 No anginal symptoms.  5. Snoring/witnessed apnea Sleep study pending.   Follow up with Tommye Standard as scheduled.    [facility] [address] [place], [place] [address] [phone] 12/22/2020 10:50 AM

## 2020-12-25 ENCOUNTER — Ambulatory Visit: Payer: Medicare HMO | Admitting: Physician Assistant

## 2020-12-27 ENCOUNTER — Encounter: Payer: Self-pay | Admitting: Physician Assistant

## 2020-12-27 ENCOUNTER — Other Ambulatory Visit: Payer: Self-pay

## 2020-12-27 ENCOUNTER — Ambulatory Visit (INDEPENDENT_AMBULATORY_CARE_PROVIDER_SITE_OTHER): Payer: Medicare HMO | Admitting: Physician Assistant

## 2020-12-27 VITALS — BP 101/63 | HR 60 | Temp 98.2°F | Ht 68.0 in | Wt 157.7 lb

## 2020-12-27 DIAGNOSIS — F4329 Adjustment disorder with other symptoms: Secondary | ICD-10-CM

## 2020-12-27 DIAGNOSIS — I10 Essential (primary) hypertension: Secondary | ICD-10-CM | POA: Diagnosis not present

## 2020-12-27 DIAGNOSIS — I4819 Other persistent atrial fibrillation: Secondary | ICD-10-CM | POA: Diagnosis not present

## 2020-12-27 DIAGNOSIS — R7301 Impaired fasting glucose: Secondary | ICD-10-CM

## 2020-12-27 DIAGNOSIS — E785 Hyperlipidemia, unspecified: Secondary | ICD-10-CM | POA: Diagnosis not present

## 2020-12-27 DIAGNOSIS — R69 Illness, unspecified: Secondary | ICD-10-CM | POA: Diagnosis not present

## 2020-12-27 NOTE — Progress Notes (Signed)
Established Patient Office Visit  Subjective:  Patient ID: George [name], male    DOB: [birth year]  Age: 70 y.o. MRN: [id number]  CC:  Chief Complaint  Patient presents with   Follow-up    Mood   Hypertension   Hyperlipidemia    HPI George [name] presents for follow up on hypertension, hyperlipidemia and mood. Patient reports is doing better. Has more energy and has been more active. Has no acute concerns.  HTN: Pt denies chest pain, palpitations, dizziness, syncope, shortness of breath, or lower extremity swelling. Taking medications as directed without side effects. Has not checked BP at home recently.  HLD: Pt taking medication as directed without issues. States his diet is balanced.  Mood: Reports anxiety is doing well without medication. Denies increased anxiety or panic attacks. No SI/HI.   Past Medical History:  Diagnosis Date   AICD (automatic cardioverter/defibrillator) present    Arthritis THUMBS   LBBB (left bundle branch block)    bilat. BBB- PPM   Peyronie's disease    Presence of permanent cardiac pacemaker     Past Surgical History:  Procedure Laterality Date   CARDIOVASCULAR STRESS TEST  10-18-2002  DR  CRENSHAW   NORMAL ADENOSINE CARDIOLITE/ EF 60%   CARDIOVERSION N/A 08/18/2020   Procedure: CARDIOVERSION;  Surgeon: Geralynn Rile, MD;  Location: [facility] [place];  Service: Cardiovascular;  Laterality: N/A;   CARDIOVERSION N/A 11/15/2020   Procedure: CARDIOVERSION;  Surgeon: Sueanne Margarita, MD;  Location: [facility];  Service: Cardiovascular;  Laterality: N/A;   CATARACT EXTRACTION W/ INTRAOCULAR LENS IMPLANT  2006   LEFT EYE   EP IMPLANTABLE DEVICE N/A 10/18/2015   Procedure: BiV Pacemaker Insertion CRT-P;  Surgeon: [name], MD;  Location: [place] [facility];  Service: Cardiovascular;  Laterality: N/A;   INGUINAL HERNIA REPAIR Right 02/23/2019   Procedure: RIGHT INGUINAL HERNIA REPAIR WITH MESH;  Surgeon: Erroll Luna, MD;  Location:  [place];  Service: General;  Laterality: Right;   LAPAROSCOPIC CHOLECYSTECTOMY  10-09-2007   LEFT HEART CATH AND CORONARY ANGIOGRAPHY N/A 01/30/2018   Procedure: LEFT HEART CATH AND CORONARY ANGIOGRAPHY;  Surgeon: Belva Crome, MD;  Location: [place] [facility];  Service: Cardiovascular;  Laterality: N/A;   NESBIT PROCEDURE  01/17/2012   Procedure: NESBIT PROCEDURE;  Surgeon: [name], MD;  Location: [facility];  Service: Urology;  Laterality: N/A;  16 dot plication    REPAIR DETACHED RETINA, LEFT EYE  1993   REPAIR LEFT INGUINAL HERNIA  2005    Family History  Problem Relation Age of Onset   Diabetes Mother    Hypertension Mother    Hyperlipidemia Mother    Heart disease Father    Hyperlipidemia Father    Hypertension Father    Arrhythmia Father        paf   Healthy Daughter    Healthy Son    Heart disease Paternal Grandfather    Heart attack Paternal Grandfather     Social History   Socioeconomic History   Marital status: Married    Spouse name: Not on file   Number of children: Not on file   Years of education: Not on file   Highest education level: Not on file  Occupational History   Not on file  Tobacco Use   Smoking status: Never   Smokeless tobacco: Never  Vaping Use   Vaping Use: Never used  Substance and Sexual Activity   Alcohol  use: No   Drug use: No   Sexual activity: Yes  Other Topics Concern   Not on file  Social History Narrative   Right handed    Lives with wife   Social Determinants of Health   Financial Resource Strain: Not on file  Food Insecurity: Not on file  Transportation Needs: Not on file  Physical Activity: Not on file  Stress: Not on file  Social Connections: Not on file  Intimate Partner Violence: Not on file    Outpatient Medications Prior to Visit  Medication Sig Dispense Refill   atorvastatin (LIPITOR) 40 MG tablet TAKE 1 TABLET BY MOUTH EVERY DAY AT 6PM 90 tablet 2   carvedilol  (COREG) 25 MG tablet TAKE 1 TABLET BY MOUTH TWICE A DAY WITH MEALS 180 tablet 2   Cholecalciferol (VITAMIN D3) 5000 units TABS 5,000 IU OTC vitamin D3 daily. 90 tablet 3   clotrimazole-betamethasone (LOTRISONE) cream Apply 1 application topically 2 (two) times daily. 45 g 0   dofetilide (TIKOSYN) 500 MCG capsule Take 1 capsule (500 mcg total) by mouth 2 (two) times daily. 60 capsule 5   ELIQUIS 5 MG TABS tablet TAKE 1 TABLET BY MOUTH TWICE A DAY 60 tablet 6   fexofenadine (ALLEGRA) 180 MG tablet TAKE 1 TABLET (180 MG TOTAL) BY MOUTH DAILY. DURING ALLERGY SEASON 90 tablet 1   fluticasone (FLONASE) 50 MCG/ACT nasal spray USE 1 SPRAY IN EACH NOSTRIL AFTER SINUS RINSES TWICE DAILY. 48 mL 1   losartan (COZAAR) 25 MG tablet Take 0.5 tablets (12.5 mg total) by mouth daily. 45 tablet 3   Multiple Vitamins-Minerals (CENTRUM ADULTS) TABS Take 1 tablet by mouth daily.     potassium chloride SA (KLOR-CON) 20 MEQ tablet Take 1 tablet (20 mEq total) by mouth daily. 30 tablet 0   sildenafil (REVATIO) 20 MG tablet Take 20-100 mg by mouth daily as needed (erectile disfunction).     spironolactone (ALDACTONE) 25 MG tablet Take 1 tablet (25 mg total) by mouth daily. 30 tablet 5   tamsulosin (FLOMAX) 0.4 MG CAPS capsule Take 0.4 mg by mouth daily.     No facility-administered medications prior to visit.    No Known Allergies  ROS Review of Systems A fourteen system review of systems was performed and found to be positive as per HPI.   Objective:    Physical Exam General:  Pleasant and cooperative, in no acute distress   Neuro:  Alert and oriented,  extra-ocular muscles intact  HEENT:  Normocephalic, atraumatic, neck supple Skin:  no gross rash, warm, pink. Cardiac:  RRR, no murmur  Respiratory: CTA B/L, Not using accessory muscles, speaking in full sentences- unlabored. Vascular:  Ext warm, no cyanosis apprec.; cap RF less 2 sec. Psych:  No HI/SI, judgement and insight good, Euthymic mood. Full  Affect.  BP 101/63   Pulse 60   Temp 98.2 F (36.8 C)   Ht $R'5\' 8"'PA$  (1.727 m)   Wt 157 lb 11.2 oz (71.5 kg)   SpO2 100%   BMI 23.98 kg/m  Wt Readings from Last 3 Encounters:  12/27/20 157 lb 11.2 oz (71.5 kg)  12/22/20 158 lb 9.6 oz (71.9 kg)  12/12/20 158 lb 11.2 oz (72 kg)     Health Maintenance Due  Topic Date Due   Zoster Vaccines- Shingrix (1 of 2) Never done   COVID-19 Vaccine (4 - Booster for Pfizer series) 03/21/2020   INFLUENZA VACCINE  Never done    There are no preventive  care reminders to display for this patient.  Lab Results  Component Value Date   TSH 1.010 12/18/2020   Lab Results  Component Value Date   WBC 6.9 12/18/2020   HGB 14.1 12/18/2020   HCT 40.1 12/18/2020   MCV 89 12/18/2020   PLT 190 12/18/2020   Lab Results  Component Value Date   NA 136 12/22/2020   K 4.6 12/22/2020   CO2 28 12/22/2020   GLUCOSE 84 12/22/2020   BUN 13 12/22/2020   CREATININE 0.85 12/22/2020   BILITOT 0.9 12/18/2020   ALKPHOS 128 (H) 12/18/2020   AST 28 12/18/2020   ALT 33 12/18/2020   PROT 6.8 12/18/2020   ALBUMIN 4.3 12/18/2020   CALCIUM 9.6 12/22/2020   ANIONGAP 6 12/22/2020   EGFR 97 12/18/2020   Lab Results  Component Value Date   CHOL 112 12/18/2020   Lab Results  Component Value Date   HDL 45 12/18/2020   Lab Results  Component Value Date   LDLCALC 53 12/18/2020   Lab Results  Component Value Date   TRIG 62 12/18/2020   Lab Results  Component Value Date   CHOLHDL 2.5 12/18/2020   Lab Results  Component Value Date   HGBA1C 5.9 (H) 12/18/2020   Depression screen PHQ 2/9 12/27/2020 12/13/2020 11/14/2020 08/21/2020 08/01/2020  Decreased Interest 0 0 1 0 0  Down, Depressed, Hopeless 0 0 0 0 0  PHQ - 2 Score 0 0 1 0 0  Altered sleeping 0 0 0 0 0  Tired, decreased energy 1 1 0 2 1  Change in appetite 0 0 0 0 0  Feeling bad or failure about yourself  0 0 0 0 0  Trouble concentrating 1 0 0 0 0  Moving slowly or fidgety/restless 0 0 0 0 0   Suicidal thoughts 0 0 0 0 0  PHQ-9 Score _0 Difficult doing work/chores Not difficult at all - - Somewhat difficult -  Some recent data might be hidden   GAD 7 : Generalized Anxiety Score 12/27/2020 11/14/2020 08/21/2020 01/21/2020  Nervous, Anxious, on Edge 0 0 0 1  Control/stop worrying 0 0 0 0  Worry too much - different things 0 0 0 1  Trouble relaxing 0 0 0 0  Restless 0 0 0 0  Easily annoyed or irritable 0 0 0 1  Afraid - awful might happen 0 0 0 0  Total GAD 7 Score 0 0 0 3  Anxiety Difficulty Not difficult at all - - Somewhat difficult       Assessment & Plan:   Problem List Items Addressed This Visit       Cardiovascular and Mediastinum   Essential hypertension (Chronic)   Persistent atrial fibrillation (HCC)     Other   Hyperlipidemia LDL goal <100 - Primary   Adjustment disorder   Essential hypertension: -Soft blood pressure in office. Asymptomatic. -Recommend to continue to follow up with cardiology. Continue current medication regimen. Recent CMP, renal function and electrolytes normal.  -Will continue to monitor.  Persistent atrial fibrillation: -Patient is s/p initiation of dofetilide. -On carvedilol 25 mg BID and apixaban 5 mg BID.  -Continue to follow up with cardiology. Reviewed recent consult.  Hyperlipidemia: -Recent lipid panel wnl's, LDL 53 (at goal <70). -Continue atorvastatin 40 mg. Recent ALT 33. -Will continue to monitor.  Adjustment disorder: -Stable. -Will continue with nonpharmacologic therapy. -Will continue to monitor.   Impaired fasting glucose: -Recent A1c 5.9, increased  from 5.5. -Recommend to follow low carbohydrate and glucose diet. -Will continue to monitor.    No orders of the defined types were placed in this encounter.   Follow-up: Return in about 4 months (around 04/29/2021) for Mood, HTN, HLD.    Lorrene Reid, PA-C

## 2021-01-04 ENCOUNTER — Other Ambulatory Visit (HOSPITAL_COMMUNITY): Payer: Self-pay

## 2021-01-05 ENCOUNTER — Other Ambulatory Visit (HOSPITAL_COMMUNITY): Payer: Self-pay

## 2021-01-13 NOTE — Progress Notes (Addendum)
Cardiology Office Note Date:  01/13/2021  Patient ID:  George [name], George [name] Feb 27, 1951, MRN [id number] PCP:  Lorrene Reid, PA-C  Cardiologist:  Dr. Radford Pax Electrophysiologist: Dr. Caryl Comes    Chief Complaint: post hospital/Tikosyn initiation  History of Present Illness: George [name] is a 70 y.o. male with history of HTN, , 2nd degree AVblock,II, s/p CRT-P, ATach, NSVT, AFib  He comes in today for his 4mo post Tikosyn initiation visit.  He did not require dose titration or DCCV.  During his stay his lasix was stopped and started on aldactone and home K+ continued as well given hypokalemia. He saw R. Fenton, PA-C 12/22/20, maintaining SR, QTc was stable, labs done and stable.   TODAY He is accompanied by his wife today. He has not felt like he has had any Afib. He reports 4-5 months of generally not feeling great though. His legs/thighs, calves seem weak b/l and equally, and doesn't feel like he has the same stamina Walking makes his calves hurt b/l His wife says that she feels like his thinking has slowed. He saw his PMD service yesterday but did not discuss these symptoms, his wife was not with him. No CP, no overt SOB, but says in the last couple months, especially the last month with the AFib he has not been doing much and now getting into his busy season, thinks he has deconditioned No near syncope or syncope. No bleeding or signs of bleeding    Device information:  MDT CRT-P, implanted 10/18/15, Dr. Caryl Comes  AFib Hx Found via device, May 2022  AAD Tikosyn, started 11/2020   Past Medical History:  Diagnosis Date   AICD (automatic cardioverter/defibrillator) present    Arthritis THUMBS   LBBB (left bundle branch block)    bilat. BBB- PPM   Peyronie's disease    Presence of permanent cardiac pacemaker     Past Surgical History:  Procedure Laterality Date   CARDIOVASCULAR STRESS TEST  10-18-2002  DR  CRENSHAW   NORMAL ADENOSINE CARDIOLITE/ EF 60%   CARDIOVERSION  N/A 08/18/2020   Procedure: CARDIOVERSION;  Surgeon: Geralynn Rile, MD;  Location: [place];  Service: Cardiovascular;  Laterality: N/A;   CARDIOVERSION N/A 11/15/2020   Procedure: CARDIOVERSION;  Surgeon: Sueanne Margarita, MD;  Location: [facility];  Service: Cardiovascular;  Laterality: N/A;   CATARACT EXTRACTION W/ INTRAOCULAR LENS IMPLANT  2006   LEFT EYE   EP IMPLANTABLE DEVICE N/A 10/18/2015   Procedure: BiV Pacemaker Insertion CRT-P;  Surgeon: [name], MD;  Location: [place] [facility];  Service: Cardiovascular;  Laterality: N/A;   INGUINAL HERNIA REPAIR Right 02/23/2019   Procedure: RIGHT INGUINAL HERNIA REPAIR WITH MESH;  Surgeon: Erroll Luna, MD;  Location: [place];  Service: General;  Laterality: Right;   LAPAROSCOPIC CHOLECYSTECTOMY  10-09-2007   LEFT HEART CATH AND CORONARY ANGIOGRAPHY N/A 01/30/2018   Procedure: LEFT HEART CATH AND CORONARY ANGIOGRAPHY;  Surgeon: Belva Crome, MD;  Location: [place] [facility];  Service: Cardiovascular;  Laterality: N/A;   NESBIT PROCEDURE  01/17/2012   Procedure: NESBIT PROCEDURE;  Surgeon: [name], MD;  Location: [facility];  Service: Urology;  Laterality: N/A;  16 dot plication    REPAIR DETACHED RETINA, LEFT EYE  1993   REPAIR LEFT INGUINAL HERNIA  2005    Current Outpatient Medications  Medication Sig Dispense Refill   atorvastatin (LIPITOR) 40 MG tablet TAKE 1 TABLET BY MOUTH EVERY DAY AT 6PM 90  tablet 2   carvedilol (COREG) 25 MG tablet TAKE 1 TABLET BY MOUTH TWICE A DAY WITH MEALS 180 tablet 2   Cholecalciferol (VITAMIN D3) 5000 units TABS 5,000 IU OTC vitamin D3 daily. 90 tablet 3   clotrimazole-betamethasone (LOTRISONE) cream Apply 1 application topically 2 (two) times daily. 45 g 0   dofetilide (TIKOSYN) 500 MCG capsule Take 1 capsule (500 mcg total) by mouth 2 (two) times daily. 60 capsule 5   ELIQUIS 5 MG TABS tablet TAKE 1 TABLET BY MOUTH TWICE A DAY 60 tablet 6    fexofenadine (ALLEGRA) 180 MG tablet TAKE 1 TABLET (180 MG TOTAL) BY MOUTH DAILY. DURING ALLERGY SEASON 90 tablet 1   fluticasone (FLONASE) 50 MCG/ACT nasal spray USE 1 SPRAY IN EACH NOSTRIL AFTER SINUS RINSES TWICE DAILY. 48 mL 1   losartan (COZAAR) 25 MG tablet Take 0.5 tablets (12.5 mg total) by mouth daily. 45 tablet 3   Multiple Vitamins-Minerals (CENTRUM ADULTS) TABS Take 1 tablet by mouth daily.     potassium chloride SA (KLOR-CON) 20 MEQ tablet Take 1 tablet (20 mEq total) by mouth daily. 30 tablet 0   sildenafil (REVATIO) 20 MG tablet Take 20-100 mg by mouth daily as needed (erectile disfunction).     spironolactone (ALDACTONE) 25 MG tablet Take 1 tablet (25 mg total) by mouth daily. 30 tablet 5   tamsulosin (FLOMAX) 0.4 MG CAPS capsule Take 0.4 mg by mouth daily.     No current facility-administered medications for this visit.    Allergies:   Patient has no known allergies.   Social History:  The patient  reports that he has never smoked. He has never used smokeless tobacco. He reports that he does not drink alcohol and does not use drugs.   Family History:  The patient's family history includes Arrhythmia in his father; Diabetes in his mother; Healthy in his daughter and son; Heart attack in his paternal grandfather; Heart disease in his father and paternal grandfather; Hyperlipidemia in his father and mother; Hypertension in his father and mother.  ROS:  Please see the history of present illness.  All other systems are reviewed and otherwise negative.   PHYSICAL EXAM:  VS:  There were no vitals taken for this visit. BMI: There is no height or weight on file to calculate BMI. Well nourished, well developed, in no acute distress  HEENT: normocephalic, atraumatic  Neck: no JVD, carotid bruits or masses Cardiac: RRR; no significant murmurs, no rubs, or gallops Lungs:  CTA b/l, no wheezing, rhonchi or rales  Abd: soft, nontender MS: no deformity or arophy Ext:  no edema  Skin:  warm and dry, no rash Neuro:  No gross deficits appreciated Psych: euthymic mood, full affect  PPM site is stable, no tethering or discomfort   EKG:  done today and reviewed by myself: AV 0paced, QRS 142, QTc 542ms  Device interrogation done today and reviewed by myself:   Battery and lead measurements are stable 2 NSVT No A arrhythmias since initiation of tikosyn  Echo 06/05/20  1. Inferior wall hypokinesis . Left ventricular ejection fraction, by  estimation, is 50 to 55%. The left ventricle has low normal function. The  left ventricle has no regional wall motion abnormalities. The left  ventricular internal cavity size was  mildly dilated. There is mild asymmetric left ventricular hypertrophy of  the basal and septal segments. Left ventricular diastolic parameters are  indeterminate.   2. Pacing wire in RA/RV . Right ventricular systolic function  is normal.  The right ventricular size is normal.   3. Left atrial size was mildly dilated.   4. Right atrial size was mildly dilated.   5. The mitral valve is abnormal. Mild mitral valve regurgitation. No  evidence of mitral stenosis.   6. The aortic valve is abnormal. Aortic valve regurgitation is mild. No  aortic stenosis is present.   7. The inferior vena cava is normal in size with greater than 50%  respiratory variability, suggesting right atrial pressure of 3 mmHg.   01/30/2018: LHC Nonobstructive coronary disease involving the RCA, proximal ramus intermedius, and LAD. Saccular aneurysm involving the proximal to mid segment of the ramus intermedius.  Aneurysm is approximately 5 mm in diameter. Mildly depressed LV function with global hypokinesis, dyssynergy, and EF estimated at 40%.  Normal LVEDP.   RECOMMENDATIONS:   Aggressive risk factor modification for nonobstructive CAD: LDL less than 70, blood pressure less than 130/80 mmHg, moderate aerobic activity, and monitoring for evidence of hypoglycemia. Saccular aneurysm and  ramus intermedius requires management similar to that for coronary artery disease and in addition would recommend antiplatelet therapy to prevent distal embolization (favor monotherapy with Plavix over aspirin).    01/31/17: TTE Study Conclusions - Left ventricle: The cavity size was normal. Systolic function was   normal. The estimated ejection fraction was in the range of 50%   to 55%. Wall motion was normal; there were no regional wall   motion abnormalities. Doppler parameters are consistent with   abnormal left ventricular relaxation (grade 1 diastolic   dysfunction). - Aortic valve: There was mild regurgitation. - Aorta: Aortic root dimension: 39 mm (ED). - Ascending aorta: The ascending aorta was mildly dilated. - Mitral valve: There was mild regurgitation. - Right ventricle: The cavity size was moderately dilated. Wall   thickness was normal. Pacer wire or catheter noted in right   ventricle. - Right atrium: The atrium was mildly dilated. Impressions:  - EF mildly improved from prior study.  10/16/15: TTE Study Conclusions - Left ventricle: The cavity size was normal. Wall thickness was   normal. Systolic function was mildly reduced. The estimated   ejection fraction was in the range of 45% to 50%. Mild diffuse   hypokinesis with no identifiable regional variations. - Ventricular septum: Septal motion showed paradox. - Aortic valve: There was mild regurgitation. - Mitral valve: Systolic bowing without prolapse. There was mild   regurgitation. - Tricuspid valve: Mild prolapse. - Pulmonary arteries: PA peak pressure: 31 mm Hg (S). Impressions: - Second degree AV block and Incessant PVCs are present throughout   the study and limit evaluation of systolic and diastolic   function.    Recent Labs: 12/18/2020: ALT 33; Hemoglobin 14.1; Platelets 190; TSH 1.010 12/22/2020: BUN 13; Creatinine, Ser 0.85; Magnesium 2.0; Potassium 4.6; Sodium 136  12/18/2020: Chol/HDL Ratio 2.5;  Cholesterol, Total 112; HDL 45; LDL Chol Calc (NIH) 53; Triglycerides 62   CrCl cannot be calculated (Patient's most recent lab result is older than the maximum 21 days allowed.).   Wt Readings from Last 3 Encounters:  12/27/20 157 lb 11.2 oz (71.5 kg)  12/22/20 158 lb 9.6 oz (71.9 kg)  12/12/20 158 lb 11.2 oz (72 kg)     Other studies reviewed: Additional studies/records reviewed today include: summarized above  ASSESSMENT AND PLAN:  1. Heart block s/p PPM     Intact function      no programming changes made  2. Mild NICM     98.8 %  effective BP     Recovered LVEF by his last echo     OptiVol looks good      No symptoms or exam findings to suggest volume OL     On BB, ARB, aldactone       3. Known hx of PVC's, NSVT      4. Paroxysmal Afib CHA2DS2Vasc is 3, on Eliquis Tikosyn with stable Qtc 0 % burden since start of drug update labs today   5.  Leg weakness Claudication? Statin? Will get LE arterial studies Advised to hold his lipitor for a month and monitor LE weakness, symptoms  6. Fatigue Monitor with maintenance of SR Echo done this year with recovered LVEF May need to revisit if no improvement His wife will reach out to the PMD regarding his slowing down as well, they report he had a neurology visit almost 2 years ago, was OK without signs of dementia                  Disposition:     Current medicines are reviewed at length with the patient today.  The patient did not have any concerns regarding medicines.  Haywood Lasso, PA-C 01/13/2021 10:26 AM     [facility] [address] [place] [address] [phone] (office)  [phone] (fax)

## 2021-01-15 ENCOUNTER — Other Ambulatory Visit: Payer: Self-pay

## 2021-01-15 ENCOUNTER — Encounter: Payer: Self-pay | Admitting: Physician Assistant

## 2021-01-15 ENCOUNTER — Ambulatory Visit: Payer: Medicare HMO | Admitting: Physician Assistant

## 2021-01-15 VITALS — BP 110/60 | HR 66 | Ht 68.0 in | Wt 160.8 lb

## 2021-01-15 DIAGNOSIS — Z5181 Encounter for therapeutic drug level monitoring: Secondary | ICD-10-CM | POA: Diagnosis not present

## 2021-01-15 DIAGNOSIS — Z79899 Other long term (current) drug therapy: Secondary | ICD-10-CM | POA: Diagnosis not present

## 2021-01-15 DIAGNOSIS — I428 Other cardiomyopathies: Secondary | ICD-10-CM | POA: Diagnosis not present

## 2021-01-15 DIAGNOSIS — I442 Atrioventricular block, complete: Secondary | ICD-10-CM

## 2021-01-15 DIAGNOSIS — I452 Bifascicular block: Secondary | ICD-10-CM | POA: Diagnosis not present

## 2021-01-15 DIAGNOSIS — I480 Paroxysmal atrial fibrillation: Secondary | ICD-10-CM

## 2021-01-15 DIAGNOSIS — Z950 Presence of cardiac pacemaker: Secondary | ICD-10-CM | POA: Diagnosis not present

## 2021-01-15 DIAGNOSIS — I739 Peripheral vascular disease, unspecified: Secondary | ICD-10-CM | POA: Diagnosis not present

## 2021-01-15 LAB — BASIC METABOLIC PANEL
BUN/Creatinine Ratio: 11 (ref 10–24)
BUN: 9 mg/dL (ref 8–27)
CO2: 27 mmol/L (ref 20–29)
Calcium: 10.1 mg/dL (ref 8.6–10.2)
Chloride: 104 mmol/L (ref 96–106)
Creatinine, Ser: 0.79 mg/dL (ref 0.76–1.27)
Glucose: 98 mg/dL (ref 70–99)
Potassium: 4.7 mmol/L (ref 3.5–5.2)
Sodium: 140 mmol/L (ref 134–144)
eGFR: 97 mL/min/{1.73_m2} (ref >59)

## 2021-01-15 LAB — CUP PACEART INCLINIC DEVICE CHECK
Battery Remaining Longevity: 49 mo
Battery Voltage: 2.94 V
Brady Statistic AP VP Percent: 32.62 %
Brady Statistic AP VS Percent: 0.04 %
Brady Statistic AS VP Percent: 66.47 %
Brady Statistic AS VS Percent: 0.87 %
Brady Statistic RA Percent Paced: 33.16 %
Brady Statistic RV Percent Paced: 99.08 %
Date Time Interrogation Session: 20221031121509
Implantable Lead Implant Date: 20170802
Implantable Lead Implant Date: 20170802
Implantable Lead Implant Date: 20170802
Implantable Lead Location: 753858
Implantable Lead Location: 753859
Implantable Lead Location: 753860
Implantable Lead Model: 5076
Implantable Lead Model: 5076
Implantable Pulse Generator Implant Date: 20170802
Lead Channel Impedance Value: 1045 Ohm
Lead Channel Impedance Value: 1064 Ohm
Lead Channel Impedance Value: 1102 Ohm
Lead Channel Impedance Value: 361 Ohm
Lead Channel Impedance Value: 399 Ohm
Lead Channel Impedance Value: 475 Ohm
Lead Channel Impedance Value: 513 Ohm
Lead Channel Impedance Value: 532 Ohm
Lead Channel Impedance Value: 532 Ohm
Lead Channel Impedance Value: 570 Ohm
Lead Channel Impedance Value: 665 Ohm
Lead Channel Impedance Value: 855 Ohm
Lead Channel Impedance Value: 893 Ohm
Lead Channel Impedance Value: 969 Ohm
Lead Channel Pacing Threshold Amplitude: 0.75 V
Lead Channel Pacing Threshold Amplitude: 1.25 V
Lead Channel Pacing Threshold Amplitude: 1.375 V
Lead Channel Pacing Threshold Pulse Width: 0.4 ms
Lead Channel Pacing Threshold Pulse Width: 0.4 ms
Lead Channel Pacing Threshold Pulse Width: 0.8 ms
Lead Channel Sensing Intrinsic Amplitude: 1.625 mV
Lead Channel Sensing Intrinsic Amplitude: 13.5 mV
Lead Channel Sensing Intrinsic Amplitude: 13.5 mV
Lead Channel Sensing Intrinsic Amplitude: 2.625 mV
Lead Channel Setting Pacing Amplitude: 1.5 V
Lead Channel Setting Pacing Amplitude: 1.75 V
Lead Channel Setting Pacing Amplitude: 2.75 V
Lead Channel Setting Pacing Pulse Width: 0.4 ms
Lead Channel Setting Pacing Pulse Width: 0.8 ms
Lead Channel Setting Sensing Sensitivity: 2.8 mV

## 2021-01-15 LAB — MAGNESIUM: Magnesium: 2 mg/dL (ref 1.6–2.3)

## 2021-01-15 NOTE — Patient Instructions (Addendum)
Medication Instructions:   Your physician recommends that you continue on your current medications as directed. Please refer to the Current Medication list given to you today.   HOLD ATORVASTATIN 3-4 WEEKS AND CONTACT CLINIC BACK  WITH UPDATE HOW LEGS ARE FEELING  THROUGH TELEPHONE [phone] OR MYCHART   *If you need a refill on your cardiac medications before your next appointment, please call your pharmacy*   Lab Work:  [facility] AND [place]   If you have labs (blood work) drawn today and your tests are completely normal, you will receive your results only by: [place] (if you have MyChart) OR A paper copy in the mail If you have any lab test that is abnormal or we need to change your treatment, we will call you to review the results.   Testing/Procedures: Your physician has requested that you have an ankle brachial index (ABI). During this test an ultrasound and blood pressure cuff are used to evaluate the arteries that supply the arms and legs with blood. Allow thirty minutes for this exam. There are no restrictions or special instructions.    Follow-Up: At [facility], you and your health needs are our priority.  As part of our continuing mission to provide you with exceptional heart care, we have created designated Provider Care Teams.  These Care Teams include your primary Cardiologist (physician) and Advanced Practice Providers (APPs -  Physician Assistants and Nurse Practitioners) who all work together to provide you with the care you need, when you need it.  We recommend signing up for the patient portal called "MyChart".  Sign up information is provided on this After Visit Summary.  MyChart is used to connect with patients for Virtual Visits (Telemedicine).  Patients are able to view lab/test results, encounter notes, upcoming appointments, etc.  Non-urgent messages can be sent to your provider as well.   To learn more about what you can do with MyChart, go to  [email or web address].    Your next appointment:    4 month(s) (CHECK RECALL IN EPIC)  The format for your next appointment:   In Person  Provider:   You may see Virl Axe, MD or one of the following Advanced Practice Providers on your designated Care Team:   Tommye Standard, [place] Legrand Como "Jonni Sanger" Chalmers Cater, [place]   Other Instructions

## 2021-01-17 ENCOUNTER — Other Ambulatory Visit (HOSPITAL_COMMUNITY): Payer: Self-pay | Admitting: Physician Assistant

## 2021-01-17 DIAGNOSIS — I739 Peripheral vascular disease, unspecified: Secondary | ICD-10-CM

## 2021-01-18 ENCOUNTER — Telehealth: Payer: Self-pay

## 2021-01-18 NOTE — Telephone Encounter (Signed)
Alert remote reviewed. Normal device function.   Alert for persistent AF, on [place] according to previous reports, AF burden is 11.4% of the time.  Good ventricular rate control.    Pt meds include: Carvedilol 25mg  BID, Tikosyn 561mcg BID, Eliquis 5 mg BID  Pt recently admitted at end of September for Tikosyn loading, he had maintained SR up until now.    Spoke with pt, he reports yesterday afternoon he was fatigued and today he had an episode of dizziness.  He denies any other cardiac symptoms.  He also confirmed compliance with meds as ordered.    Pt sent in manual transmission confirming that he is still in AF.    Advised pt I would forward info to AF Clinic for follow-up

## 2021-01-18 NOTE — Telephone Encounter (Signed)
Patient continues in AF. Will bring in for assessment next week. Pt will call if he returns to normal rhythm.

## 2021-01-18 NOTE — Telephone Encounter (Signed)
pt on the line following up on this mornings phone call.... is there an update we can provide to the pt? Please advise

## 2021-01-18 NOTE — Telephone Encounter (Signed)
Provided patient wife with phone number for AF clinic.

## 2021-01-19 ENCOUNTER — Other Ambulatory Visit: Payer: Self-pay | Admitting: *Deleted

## 2021-01-19 DIAGNOSIS — E785 Hyperlipidemia, unspecified: Secondary | ICD-10-CM

## 2021-01-19 MED ORDER — ATORVASTATIN CALCIUM 40 MG PO TABS: ORAL_TABLET | ORAL | 2 refills | Status: DC

## 2021-01-22 ENCOUNTER — Ambulatory Visit (HOSPITAL_COMMUNITY)
Admission: RE | Admit: 2021-01-22 | Discharge: 2021-01-22 | Disposition: A | Discharge status: Home / Self Care | Payer: Medicare HMO | Source: Ambulatory Visit | Attending: Physician Assistant | Admitting: Physician Assistant

## 2021-01-22 ENCOUNTER — Other Ambulatory Visit: Payer: Self-pay

## 2021-01-22 VITALS — BP 132/76 | HR 59 | Ht 68.0 in | Wt 162.0 lb

## 2021-01-22 DIAGNOSIS — Z7901 Long term (current) use of anticoagulants: Secondary | ICD-10-CM | POA: Diagnosis not present

## 2021-01-22 DIAGNOSIS — I472 Ventricular tachycardia, unspecified: Secondary | ICD-10-CM | POA: Insufficient documentation

## 2021-01-22 DIAGNOSIS — Z950 Presence of cardiac pacemaker: Secondary | ICD-10-CM | POA: Diagnosis not present

## 2021-01-22 DIAGNOSIS — I2510 Atherosclerotic heart disease of native coronary artery without angina pectoris: Secondary | ICD-10-CM | POA: Insufficient documentation

## 2021-01-22 DIAGNOSIS — Z79899 Other long term (current) drug therapy: Secondary | ICD-10-CM | POA: Insufficient documentation

## 2021-01-22 DIAGNOSIS — I10 Essential (primary) hypertension: Secondary | ICD-10-CM | POA: Insufficient documentation

## 2021-01-22 DIAGNOSIS — R0683 Snoring: Secondary | ICD-10-CM | POA: Diagnosis not present

## 2021-01-22 DIAGNOSIS — D6869 Other thrombophilia: Secondary | ICD-10-CM

## 2021-01-22 DIAGNOSIS — I4819 Other persistent atrial fibrillation: Secondary | ICD-10-CM

## 2021-01-22 DIAGNOSIS — I441 Atrioventricular block, second degree: Secondary | ICD-10-CM | POA: Insufficient documentation

## 2021-01-22 DIAGNOSIS — R0681 Apnea, not elsewhere classified: Secondary | ICD-10-CM | POA: Diagnosis not present

## 2021-01-22 NOTE — Progress Notes (Signed)
Primary Care Physician: Lorrene Reid, PA-C Primary Electrophysiologist: Dr Caryl Comes Referring Physician: Device clinic/Dr Lillia Dallas George [name] is a 70 y.o. male with a history of HTN, CAD, 2nd degree AV block II s/p CRT-P, ATach, NSVT, atrial fibrillation who presents for follow up in the [facility]. The patient was initially diagnosed with atrial fibrillation 07/20/20 after the device clinic received an alert for an ongoing episode. Patient has a CHADS2VASC score of 3. He denies palpitations but noted more fatigue. There were no specific triggers that he could identify. Patient was seen in follow up on 11/03/20 and was back in afib. He underwent repeat DCCV on 11/15/20. Patient is s/p dofetilide loading 9/28-9/30/22.   On follow up today, the device clinic received an alert for an afib episode starting 01/17/21. Patient did have symptoms of fatigue. He is back in SR today. He was just approved by insurance for his sleep study. His wife has witnessed patient having apneic episodes.   Today, he denies symptoms of palpitations, chest pain, shortness of breath, orthopnea, PND, lower extremity edema, dizziness, presyncope, syncope, bleeding, or neurologic sequela. The patient is tolerating medications without difficulties and is otherwise without complaint today.    Atrial Fibrillation Risk Factors:  he does have symptoms or diagnosis of sleep apnea. he does not have a history of rheumatic fever. he does not have a history of alcohol use. The patient does have a history of early familial atrial fibrillation or other arrhythmias. Father has afib.  he has a BMI of Body mass index is 24.63 kg/m.Marland Kitchen Filed Weights   01/22/21 0848  Weight: 73.5 kg        Family History  Problem Relation Age of Onset   Diabetes Mother    Hypertension Mother    Hyperlipidemia Mother    Heart disease Father    Hyperlipidemia Father    Hypertension Father    Arrhythmia Father         paf   Healthy Daughter    Healthy Son    Heart disease Paternal Grandfather    Heart attack Paternal Grandfather      Atrial Fibrillation Management history:  Previous antiarrhythmic drugs: dofetilide  Previous cardioversions: 08/18/20, 11/15/20 Previous ablations: none CHADS2VASC score: 3 Anticoagulation history: Eliquis   Past Medical History:  Diagnosis Date   AICD (automatic cardioverter/defibrillator) present    Arthritis THUMBS   LBBB (left bundle branch block)    bilat. BBB- PPM   Peyronie's disease    Presence of permanent cardiac pacemaker    Past Surgical History:  Procedure Laterality Date   CARDIOVASCULAR STRESS TEST  10-18-2002  DR  CRENSHAW   NORMAL ADENOSINE CARDIOLITE/ EF 60%   CARDIOVERSION N/A 08/18/2020   Procedure: CARDIOVERSION;  Surgeon: Geralynn Rile, MD;  Location: [place];  Service: Cardiovascular;  Laterality: N/A;   CARDIOVERSION N/A 11/15/2020   Procedure: CARDIOVERSION;  Surgeon: Sueanne Margarita, MD;  Location: [facility];  Service: Cardiovascular;  Laterality: N/A;   CATARACT EXTRACTION W/ INTRAOCULAR LENS IMPLANT  2006   LEFT EYE   EP IMPLANTABLE DEVICE N/A 10/18/2015   Procedure: BiV Pacemaker Insertion CRT-P;  Surgeon: [name], MD;  Location: [place];  Service: Cardiovascular;  Laterality: N/A;   INGUINAL HERNIA REPAIR Right 02/23/2019   Procedure: RIGHT INGUINAL HERNIA REPAIR WITH MESH;  Surgeon: Erroll Luna, MD;  Location: [place];  Service: General;  Laterality: Right;   LAPAROSCOPIC CHOLECYSTECTOMY  10-09-2007   LEFT HEART CATH AND CORONARY ANGIOGRAPHY N/A 01/30/2018   Procedure: LEFT HEART CATH AND CORONARY ANGIOGRAPHY;  Surgeon: Belva Crome, MD;  Location: [facility];  Service: Cardiovascular;  Laterality: N/A;   NESBIT PROCEDURE  01/17/2012   Procedure: NESBIT PROCEDURE;  Surgeon: [name], MD;  Location: [facility];  Service: Urology;  Laterality: N/A;   16 dot plication    REPAIR DETACHED RETINA, LEFT EYE  1993   REPAIR LEFT INGUINAL HERNIA  2005    Current Outpatient Medications  Medication Sig Dispense Refill   carvedilol (COREG) 25 MG tablet TAKE 1 TABLET BY MOUTH TWICE A DAY WITH MEALS 180 tablet 2   Cholecalciferol (VITAMIN D3) 5000 units TABS 5,000 IU OTC vitamin D3 daily. 90 tablet 3   clotrimazole-betamethasone (LOTRISONE) cream Apply 1 application topically as needed (for skin).     dofetilide (TIKOSYN) 500 MCG capsule Take 1 capsule (500 mcg total) by mouth 2 (two) times daily. 60 capsule 5   ELIQUIS 5 MG TABS tablet TAKE 1 TABLET BY MOUTH TWICE A DAY 60 tablet 6   fexofenadine (ALLEGRA) 180 MG tablet TAKE 1 TABLET (180 MG TOTAL) BY MOUTH DAILY. DURING ALLERGY SEASON 90 tablet 1   fluticasone (FLONASE) 50 MCG/ACT nasal spray USE 1 SPRAY IN EACH NOSTRIL AFTER SINUS RINSES TWICE DAILY. 48 mL 1   losartan (COZAAR) 25 MG tablet Take 0.5 tablets (12.5 mg total) by mouth daily. 45 tablet 3   Multiple Vitamins-Minerals (CENTRUM ADULTS) TABS Take 1 tablet by mouth daily.     potassium chloride SA (KLOR-CON) 20 MEQ tablet Take 1 tablet (20 mEq total) by mouth daily. 30 tablet 0   spironolactone (ALDACTONE) 25 MG tablet Take 1 tablet (25 mg total) by mouth daily. 30 tablet 5   tamsulosin (FLOMAX) 0.4 MG CAPS capsule Take 0.4 mg by mouth daily.     atorvastatin (LIPITOR) 40 MG tablet TAKE 1 TABLET BY MOUTH EVERY DAY AT 6PM (Patient not taking: Reported on 01/22/2021) 90 tablet 2   No current facility-administered medications for this encounter.    No Known Allergies  Social History   Socioeconomic History   Marital status: Married    Spouse name: Not on file   Number of children: Not on file   Years of education: Not on file   Highest education level: Not on file  Occupational History   Not on file  Tobacco Use   Smoking status: Never   Smokeless tobacco: Never  Vaping Use   Vaping Use: Never used  Substance and Sexual  Activity   Alcohol use: No   Drug use: No   Sexual activity: Yes  Other Topics Concern   Not on file  Social History Narrative   Right handed    Lives with wife   Social Determinants of Health   Financial Resource Strain: Not on file  Food Insecurity: Not on file  Transportation Needs: Not on file  Physical Activity: Not on file  Stress: Not on file  Social Connections: Not on file  Intimate Partner Violence: Not on file     ROS- All systems are reviewed and negative except as per the HPI above.  Physical Exam: Vitals:   01/22/21 0848  BP: 132/76  Pulse: (!) 59  Weight: 73.5 kg  Height: 5\' 8"  (1.727 m)    GEN- The patient is a well appearing male, alert and oriented x 3 today.   HEENT-head normocephalic, atraumatic, sclera  clear, conjunctiva pink, hearing intact, trachea midline. Lungs- Clear to ausculation bilaterally, normal work of breathing Heart- Regular rate and rhythm, no murmurs, rubs or gallops  GI- soft, NT, ND, + BS Extremities- no clubbing, cyanosis, or edema MS- no significant deformity or atrophy Skin- no rash or lesion Psych- euthymic mood, full affect Neuro- strength and sensation are intact   Wt Readings from Last 3 Encounters:  01/22/21 73.5 kg  01/15/21 72.9 kg  12/27/20 71.5 kg    EKG today demonstrates  A sense V paced rhythm Vent. rate 59 BPM PR interval 128 ms QRS duration 142 ms QT/QTcB 518/512 ms  Echo 06/05/20 demonstrated  1. Inferior wall hypokinesis . Left ventricular ejection fraction, by  estimation, is 50 to 55%. The left ventricle has low normal function. The left ventricle has no regional wall motion abnormalities. The left  ventricular internal cavity size was mildly dilated. There is mild asymmetric left ventricular hypertrophy of the basal and septal segments. Left ventricular diastolic parameters are indeterminate.   2. Pacing wire in RA/RV . Right ventricular systolic function is normal. The right ventricular size is  normal.   3. Left atrial size was mildly dilated.   4. Right atrial size was mildly dilated.   5. The mitral valve is abnormal. Mild mitral valve regurgitation. No  evidence of mitral stenosis.   6. The aortic valve is abnormal. Aortic valve regurgitation is mild. No  aortic stenosis is present.   7. The inferior vena cava is normal in size with greater than 50%  respiratory variability, suggesting right atrial pressure of 3 mmHg.   Epic records are reviewed at length today  CHA2DS2-VASc Score = 3  The patient's score is based upon: CHF History: 0 HTN History: 1 Diabetes History: 0 Stroke History: 0 Vascular Disease History: 1 Age Score: 1 Gender Score: 0      ASSESSMENT AND PLAN: 1. Persistent atrial fibrillation  The patient's CHA2DS2-VASc score is 3, indicating a 3.2% annual risk of stroke.   S/p dofetilide loading 9/27-9/30/22 Patient converted back to SR.  ? Related to untreated sleep apnea. Patient will call and schedule now that it has been approved by insurance.  Continue dofetilide 500 mcg BID. QT stable accounting for V pacing.  Continue Eliquis 5 mg BID Continue Coreg 25 mg BID If he continues to have frequent symptomatic afib, ablation may be a reasonable next step.   2. Secondary Hypercoagulable State (ICD10:  D68.69) The patient is at significant risk for stroke/thromboembolism based upon his CHA2DS2-VASc Score of 3.  Continue Apixaban (Eliquis).   3. 2nd degree AV block type II S/p PPM, followed by Dr Caryl Comes and the device clinic.  4. CAD Nonobstructive on LHC 2019 No anginal symptoms.  5. Snoring/witnessed apnea Sleep study pending.   Follow up with Tommye Standard as scheduled.    [facility] [address] [place], [place] [address] [phone] 01/22/2021 9:34 AM

## 2021-01-24 ENCOUNTER — Ambulatory Visit (INDEPENDENT_AMBULATORY_CARE_PROVIDER_SITE_OTHER): Payer: Medicare HMO

## 2021-01-24 DIAGNOSIS — I428 Other cardiomyopathies: Secondary | ICD-10-CM

## 2021-01-24 LAB — CUP PACEART REMOTE DEVICE CHECK
Battery Remaining Longevity: 47 mo
Battery Voltage: 2.95 V
Brady Statistic AP VP Percent: 27.19 %
Brady Statistic AP VS Percent: 0.06 %
Brady Statistic AS VP Percent: 71.54 %
Brady Statistic AS VS Percent: 1.07 %
Brady Statistic RA Percent Paced: 17.86 %
Brady Statistic RV Percent Paced: 99.17 %
Date Time Interrogation Session: 20221108221440
Implantable Lead Implant Date: 20170802
Implantable Lead Implant Date: 20170802
Implantable Lead Implant Date: 20170802
Implantable Lead Location: 753858
Implantable Lead Location: 753859
Implantable Lead Location: 753860
Implantable Lead Model: 5076
Implantable Lead Model: 5076
Implantable Pulse Generator Implant Date: 20170802
Lead Channel Impedance Value: 1045 Ohm
Lead Channel Impedance Value: 1064 Ohm
Lead Channel Impedance Value: 1121 Ohm
Lead Channel Impedance Value: 323 Ohm
Lead Channel Impedance Value: 380 Ohm
Lead Channel Impedance Value: 456 Ohm
Lead Channel Impedance Value: 494 Ohm
Lead Channel Impedance Value: 494 Ohm
Lead Channel Impedance Value: 532 Ohm
Lead Channel Impedance Value: 570 Ohm
Lead Channel Impedance Value: 684 Ohm
Lead Channel Impedance Value: 855 Ohm
Lead Channel Impedance Value: 912 Ohm
Lead Channel Impedance Value: 969 Ohm
Lead Channel Pacing Threshold Amplitude: 0.625 V
Lead Channel Pacing Threshold Amplitude: 1.125 V
Lead Channel Pacing Threshold Amplitude: 1.25 V
Lead Channel Pacing Threshold Pulse Width: 0.4 ms
Lead Channel Pacing Threshold Pulse Width: 0.4 ms
Lead Channel Pacing Threshold Pulse Width: 0.8 ms
Lead Channel Sensing Intrinsic Amplitude: 15.25 mV
Lead Channel Sensing Intrinsic Amplitude: 15.25 mV
Lead Channel Sensing Intrinsic Amplitude: 2.25 mV
Lead Channel Sensing Intrinsic Amplitude: 2.25 mV
Lead Channel Setting Pacing Amplitude: 1.5 V
Lead Channel Setting Pacing Amplitude: 1.75 V
Lead Channel Setting Pacing Amplitude: 2.75 V
Lead Channel Setting Pacing Pulse Width: 0.4 ms
Lead Channel Setting Pacing Pulse Width: 0.8 ms
Lead Channel Setting Sensing Sensitivity: 2.8 mV

## 2021-01-26 ENCOUNTER — Ambulatory Visit (HOSPITAL_COMMUNITY)
Admission: RE | Admit: 2021-01-26 | Discharge: 2021-01-26 | Disposition: A | Discharge status: Home / Self Care | Payer: Medicare HMO | Source: Ambulatory Visit | Attending: Cardiology | Admitting: Cardiology

## 2021-01-26 ENCOUNTER — Other Ambulatory Visit: Payer: Self-pay

## 2021-01-26 DIAGNOSIS — I739 Peripheral vascular disease, unspecified: Secondary | ICD-10-CM | POA: Insufficient documentation

## 2021-02-01 NOTE — Progress Notes (Signed)
Remote pacemaker transmission.   

## 2021-02-25 ENCOUNTER — Ambulatory Visit (HOSPITAL_BASED_OUTPATIENT_CLINIC_OR_DEPARTMENT_OTHER): Payer: Medicare HMO | Attending: Physician Assistant | Admitting: Internal Medicine

## 2021-02-25 ENCOUNTER — Other Ambulatory Visit: Payer: Self-pay

## 2021-02-25 VITALS — Ht 68.0 in | Wt 163.0 lb

## 2021-02-25 DIAGNOSIS — R0683 Snoring: Secondary | ICD-10-CM | POA: Diagnosis not present

## 2021-02-25 DIAGNOSIS — I4819 Other persistent atrial fibrillation: Secondary | ICD-10-CM

## 2021-02-25 DIAGNOSIS — G4761 Periodic limb movement disorder: Secondary | ICD-10-CM | POA: Insufficient documentation

## 2021-02-25 DIAGNOSIS — R0681 Apnea, not elsewhere classified: Secondary | ICD-10-CM

## 2021-02-25 DIAGNOSIS — G4733 Obstructive sleep apnea (adult) (pediatric): Secondary | ICD-10-CM | POA: Diagnosis not present

## 2021-03-04 DIAGNOSIS — R0683 Snoring: Secondary | ICD-10-CM

## 2021-03-04 NOTE — Procedures (Signed)
° ° °  Patient Name: George [name], George [name] Date: 02/25/2021 Gender: Male D.O.B: 1950/09/05 Age (years): 70 Referring Provider: Lorrene Reid PA-C Height (inches): 68 Interpreting Physician: Baird Lyons MD, ABSM Weight (lbs): 163 RPSGT: Gwenyth Allegra BMI: 25 MRN: [id number] Neck Size: 16.00  CLINICAL INFORMATION Sleep Study Type: NPSG Indication for sleep study: Hypertension, Snoring Epworth Sleepiness Score: 11  SLEEP STUDY TECHNIQUE As per the AASM Manual for the Scoring of Sleep and Associated Events v2.3 (April 2016) with a hypopnea requiring 4% desaturations.  The channels recorded and monitored were frontal, central and occipital EEG, electrooculogram (EOG), submentalis EMG (chin), nasal and oral airflow, thoracic and abdominal wall motion, anterior tibialis EMG, snore microphone, electrocardiogram, and pulse oximetry.  MEDICATIONS Medications self-administered by patient taken the night of the study : none reported  SLEEP ARCHITECTURE The study was initiated at 10:52:49 PM and ended at 5:05:15 AM.  Sleep onset time was 13.2 minutes and the sleep efficiency was 80.4%%. The total sleep time was 299.5 minutes.  Stage REM latency was 134.0 minutes.  The patient spent 12.5%% of the night in stage N1 sleep, 71.3%% in stage N2 sleep, 0.0%% in stage N3 and 16.2% in REM.  Alpha intrusion was absent.  Supine sleep was 26.71%.  RESPIRATORY PARAMETERS The overall apnea/hypopnea index (AHI) was 10.0 per hour. There were 6 total apneas, including 6 obstructive, 0 central and 0 mixed apneas. There were 44 hypopneas and 21 RERAs.  The AHI during Stage REM sleep was 30.9 per hour.  AHI while supine was 24.8 per hour.  The mean oxygen saturation was 93.4%. The minimum SpO2 during sleep was 76.0%.  moderate snoring was noted during this study.  CARDIAC DATA The 2 lead EKG demonstrated pacemaker generated. The mean heart rate was 55.0 beats per minute. Other EKG findings  include: None.  LEG MOVEMENT DATA The total PLMS were 0 with a resulting PLMS index of 0.0. Associated arousal with leg movement index was 0.8 .  IMPRESSIONS - Mild obstructive sleep apnea occurred during this study (AHI = 10.0/h). - Moderate oxygen desaturation was noted during this study (Min O2 = 76.0%). Mean O2 saturation 93.8%. - The patient snored with moderate snoring volume. - No cardiac abnormalities were noted during this study. - Few periodic limb movements notedr during sleep. No significant associated arousals.  DIAGNOSIS - Obstructive Sleep Apnea (G47.33)  RECOMMENDATIONS - Conservative options or CPAP or an oral appliance may be appropriate. - Be careful with alcohol, sedatives and other CNS depressants that may worsen sleep apnea and disrupt normal sleep architecture. - Sleep hygiene should be reviewed to assess factors that may improve sleep quality. - Weight management and regular exercise should be initiated or continued if appropriate.  [Electronically signed] 03/04/2021 11:54 AM  Baird Lyons MD, ABSM Diplomate, American Board of Sleep Medicine   NPI: [id number]                         [facility]  ELECTRONICALLY SIGNED ON:  03/04/2021, 11:35 AM [place] PH: [phone]   FX: [phone] [place]

## 2021-03-05 ENCOUNTER — Other Ambulatory Visit: Payer: Self-pay | Admitting: *Deleted

## 2021-03-05 MED ORDER — CARVEDILOL 25 MG PO TABS: 25.0000 mg | ORAL_TABLET | Freq: Two times a day (BID) | ORAL | 2 refills | Status: DC

## 2021-03-13 DIAGNOSIS — R3912 Poor urinary stream: Secondary | ICD-10-CM | POA: Diagnosis not present

## 2021-03-13 DIAGNOSIS — N4342 Spermatocele of epididymis, multiple: Secondary | ICD-10-CM | POA: Diagnosis not present

## 2021-03-13 DIAGNOSIS — N401 Enlarged prostate with lower urinary tract symptoms: Secondary | ICD-10-CM | POA: Diagnosis not present

## 2021-03-13 DIAGNOSIS — R351 Nocturia: Secondary | ICD-10-CM | POA: Diagnosis not present

## 2021-03-13 DIAGNOSIS — Z125 Encounter for screening for malignant neoplasm of prostate: Secondary | ICD-10-CM | POA: Diagnosis not present

## 2021-03-28 ENCOUNTER — Telehealth: Payer: Self-pay | Admitting: Physician Assistant

## 2021-03-28 ENCOUNTER — Other Ambulatory Visit: Payer: Self-pay

## 2021-03-28 DIAGNOSIS — G4733 Obstructive sleep apnea (adult) (pediatric): Secondary | ICD-10-CM

## 2021-03-28 NOTE — Telephone Encounter (Signed)
Debbie (patients spouse,on DPR) called regarding the CPAP machine that they discussed with you last week. They would like an update on the process. Please call them back at the number below.   [phone]

## 2021-03-28 NOTE — Telephone Encounter (Signed)
Referral placed with [facility] for CPAP titration. Called the patient's wife to notify. Will callback for information of [facility].

## 2021-04-03 ENCOUNTER — Other Ambulatory Visit: Payer: Self-pay

## 2021-04-03 ENCOUNTER — Telehealth: Payer: Self-pay | Admitting: Physician Assistant

## 2021-04-03 DIAGNOSIS — G4733 Obstructive sleep apnea (adult) (pediatric): Secondary | ICD-10-CM

## 2021-04-03 NOTE — Telephone Encounter (Signed)
Patients wife(Debbie) called and left a vm stating she needed someone to call her back asap because [facility] is not handling his cpap or any of those supplies because they were not the ones who did the study. Please advise. [phone]

## 2021-04-03 NOTE — Telephone Encounter (Signed)
Called the [facility] at [facility] to ensure they can perform the sleep titration. Spoke with the receptionist and she confirmed they can preform the procedure. Contacted the patient's wife about sending the order back to [facility]. She gave verbal confirmation of change of location.

## 2021-04-11 ENCOUNTER — Other Ambulatory Visit: Payer: Self-pay | Admitting: Physician Assistant

## 2021-04-22 NOTE — Progress Notes (Signed)
Cardiology Office Note Date:  04/22/2021  Patient ID:  Kazimierz, Springborn [birth year], MRN [id number] PCP:  Lorrene Reid, PA-C  Cardiologist:  Dr. Radford Pax Electrophysiologist: Dr. Caryl Comes    Chief Complaint:  f/u  History of Present Illness: ANTWAN PANDYA is a 71 y.o. male with history of HTN, , 2nd degree AVblock,II, s/p CRT-P, ATach, NSVT, AFib, NICM  He comes in today for his 44mo post Tikosyn initiation visit.  He did not require dose titration or DCCV.  During his stay his lasix was stopped and started on aldactone and home K+ continued as well given hypokalemia. He saw R. Fenton, PA-C 12/22/20, maintaining SR, QTc was stable, labs done and stable.   I saw him 01/15/21 for his 65mo post Tikosyn visit He is accompanied by his wife today. He has not felt like he has had any Afib. He reports 4-5 months of generally not feeling great though. His legs/thighs, calves seem weak b/l and equally, and doesn't feel like he has the same stamina Walking makes his calves hurt b/l His wife says that she feels like his thinking has slowed. He saw his PMD service yesterday but did not discuss these symptoms, his wife was not with him. No CP, no overt SOB, but says in the last couple months, especially the last month with the AFib he has not been doing much and now getting into his busy season, thinks he has deconditioned No near syncope or syncope. No bleeding or signs of bleeding  Unclear etiology of his fatigue, seeming to be slowing? LVEF had recovered, was maintaining SR Planned for LE arterial studies, a trial off his statin   LE studies are without evidence of significant vascular disease   TODAY He thinks his LE do feel a little better off the statin, but generally still feels weak. He denies CP, palpitations, he is not SOB Says he just does not have energy. His wife reports this as a slow steady decline over the last 3+ years He denies his exertional capacity as being an overt  physical symtom, but is just to tored to keep up. He has been found to have sleep apnea, but the titration study has been held up it seems at the sleep clinic and/or insurance.  No near syncope or syncope He thinks he may have had some Afib a week or 2 ago was working in [employer] and felt particularly fatigued abut 15 minutes then better.  No CP, SOB, DOE No bleeding or signs of bleeding  He mentions K+ is on his list of medicines, but does not have any/not taking it   Device information:  MDT CRT-P, implanted 10/18/15, Dr. Caryl Comes  AFib Hx Found via device, May 2022  AAD Tikosyn, started 11/2020   Past Medical History:  Diagnosis Date   AICD (automatic cardioverter/defibrillator) present    Arthritis THUMBS   LBBB (left bundle branch block)    bilat. BBB- PPM   Peyronie's disease    Presence of permanent cardiac pacemaker     Past Surgical History:  Procedure Laterality Date   CARDIOVASCULAR STRESS TEST  10-18-2002  DR  CRENSHAW   NORMAL ADENOSINE CARDIOLITE/ EF 60%   CARDIOVERSION N/A 08/18/2020   Procedure: CARDIOVERSION;  Surgeon: Geralynn Rile, MD;  Location: [place];  Service: Cardiovascular;  Laterality: N/A;   CARDIOVERSION N/A 11/15/2020   Procedure: CARDIOVERSION;  Surgeon: Sueanne Margarita, MD;  Location: [place];  Service: Cardiovascular;  Laterality: N/A;   CATARACT  EXTRACTION W/ INTRAOCULAR LENS IMPLANT  2006   LEFT EYE   EP IMPLANTABLE DEVICE N/A 10/18/2015   Procedure: BiV Pacemaker Insertion CRT-P;  Surgeon: [name], MD;  Location: [place] [facility];  Service: Cardiovascular;  Laterality: N/A;   INGUINAL HERNIA REPAIR Right 02/23/2019   Procedure: RIGHT INGUINAL HERNIA REPAIR WITH MESH;  Surgeon: Erroll Luna, MD;  Location: [place];  Service: General;  Laterality: Right;   LAPAROSCOPIC CHOLECYSTECTOMY  10-09-2007   LEFT HEART CATH AND CORONARY ANGIOGRAPHY N/A 01/30/2018   Procedure: LEFT HEART CATH AND  CORONARY ANGIOGRAPHY;  Surgeon: Belva Crome, MD;  Location: [place] [facility];  Service: Cardiovascular;  Laterality: N/A;   NESBIT PROCEDURE  01/17/2012   Procedure: NESBIT PROCEDURE;  Surgeon: [name], MD;  Location: [facility];  Service: Urology;  Laterality: N/A;  16 dot plication    REPAIR DETACHED RETINA, LEFT EYE  1993   REPAIR LEFT INGUINAL HERNIA  2005    Current Outpatient Medications  Medication Sig Dispense Refill   atorvastatin (LIPITOR) 40 MG tablet TAKE 1 TABLET BY MOUTH EVERY DAY AT 6PM (Patient not taking: Reported on 01/22/2021) 90 tablet 2   carvedilol (COREG) 25 MG tablet Take 1 tablet (25 mg total) by mouth 2 (two) times daily with a meal. 180 tablet 2   Cholecalciferol (VITAMIN D3) 5000 units TABS 5,000 IU OTC vitamin D3 daily. 90 tablet 3   clotrimazole-betamethasone (LOTRISONE) cream Apply 1 application topically as needed (for skin).     dofetilide (TIKOSYN) 500 MCG capsule Take 1 capsule (500 mcg total) by mouth 2 (two) times daily. 60 capsule 5   ELIQUIS 5 MG TABS tablet TAKE 1 TABLET BY MOUTH TWICE A DAY 60 tablet 6   fexofenadine (ALLEGRA) 180 MG tablet TAKE 1 TABLET (180 MG TOTAL) BY MOUTH DAILY. DURING ALLERGY SEASON 90 tablet 1   fluticasone (FLONASE) 50 MCG/ACT nasal spray USE 1 SPRAY IN EACH NOSTRIL AFTER SINUS RINSES TWICE DAILY. 48 mL 1   losartan (COZAAR) 25 MG tablet Take 0.5 tablets (12.5 mg total) by mouth daily. 45 tablet 3   Multiple Vitamins-Minerals (CENTRUM ADULTS) TABS Take 1 tablet by mouth daily.     potassium chloride SA (KLOR-CON) 20 MEQ tablet Take 1 tablet (20 mEq total) by mouth daily. 30 tablet 0   spironolactone (ALDACTONE) 25 MG tablet Take 1 tablet (25 mg total) by mouth daily. Please keep upcoming appointment in February 2023 for future refills. Thank you 90 tablet 0   tamsulosin (FLOMAX) 0.4 MG CAPS capsule Take 0.4 mg by mouth daily.     No current facility-administered medications for this visit.     Allergies:   Patient has no known allergies.   Social History:  The patient  reports that he has never smoked. He has never used smokeless tobacco. He reports that he does not drink alcohol and does not use drugs.   Family History:  The patient's family history includes Arrhythmia in his father; Diabetes in his mother; Healthy in his daughter and son; Heart attack in his paternal grandfather; Heart disease in his father and paternal grandfather; Hyperlipidemia in his father and mother; Hypertension in his father and mother.  ROS:  Please see the history of present illness.  All other systems are reviewed and otherwise negative.   PHYSICAL EXAM:  VS:  There were no vitals taken for this visit. BMI: There is no height or weight on file to calculate BMI.  Well nourished, well developed, in no acute distress  HEENT: normocephalic, atraumatic  Neck: no JVD, carotid bruits or masses Cardiac: RRR; no significant murmurs, no rubs, or gallops Lungs:  CTA b/l, no wheezing, rhonchi or rales  Abd: soft, nontender MS: no deformity or arophy Ext:  no edema  Skin: warm and dry, no rash Neuro:  No gross deficits appreciated Psych: euthymic mood, full affect  PPM site is stable, no tethering or discomfort   EKG:  done today and reviewed by myself: SR/V paced 64bpm, QTc is OK  Device interrogation done today and reviewed by myself:   Battery and lead measurements are OK + AF episodes, (back in Nov) One NSVT 04/13/21, is MMVT 19seconds   01/26/21: LE arterial study Summary:  Right: Resting right ankle-brachial index is within normal range. No  evidence of significant right lower extremity arterial disease. The right  toe-brachial index is normal.   Left: Resting left ankle-brachial index is within normal range. No  evidence of significant left lower extremity arterial disease. The left  toe-brachial index is normal.   Echo 06/05/20  1. Inferior wall hypokinesis . Left ventricular  ejection fraction, by  estimation, is 50 to 55%. The left ventricle has low normal function. The  left ventricle has no regional wall motion abnormalities. The left  ventricular internal cavity size was  mildly dilated. There is mild asymmetric left ventricular hypertrophy of  the basal and septal segments. Left ventricular diastolic parameters are  indeterminate.   2. Pacing wire in RA/RV . Right ventricular systolic function is normal.  The right ventricular size is normal.   3. Left atrial size was mildly dilated.   4. Right atrial size was mildly dilated.   5. The mitral valve is abnormal. Mild mitral valve regurgitation. No  evidence of mitral stenosis.   6. The aortic valve is abnormal. Aortic valve regurgitation is mild. No  aortic stenosis is present.   7. The inferior vena cava is normal in size with greater than 50%  respiratory variability, suggesting right atrial pressure of 3 mmHg.   01/30/2018: LHC Nonobstructive coronary disease involving the RCA, proximal ramus intermedius, and LAD. Saccular aneurysm involving the proximal to mid segment of the ramus intermedius.  Aneurysm is approximately 5 mm in diameter. Mildly depressed LV function with global hypokinesis, dyssynergy, and EF estimated at 40%.  Normal LVEDP.   RECOMMENDATIONS:   Aggressive risk factor modification for nonobstructive CAD: LDL less than 70, blood pressure less than 130/80 mmHg, moderate aerobic activity, and monitoring for evidence of hypoglycemia. Saccular aneurysm and ramus intermedius requires management similar to that for coronary artery disease and in addition would recommend antiplatelet therapy to prevent distal embolization (favor monotherapy with Plavix over aspirin).    01/31/17: TTE Study Conclusions - Left ventricle: The cavity size was normal. Systolic function was   normal. The estimated ejection fraction was in the range of 50%   to 55%. Wall motion was normal; there were no regional  wall   motion abnormalities. Doppler parameters are consistent with   abnormal left ventricular relaxation (grade 1 diastolic   dysfunction). - Aortic valve: There was mild regurgitation. - Aorta: Aortic root dimension: 39 mm (ED). - Ascending aorta: The ascending aorta was mildly dilated. - Mitral valve: There was mild regurgitation. - Right ventricle: The cavity size was moderately dilated. Wall   thickness was normal. Pacer wire or catheter noted in right   ventricle. - Right atrium: The atrium was mildly dilated.  Impressions:  - EF mildly improved from prior study.  10/16/15: TTE Study Conclusions - Left ventricle: The cavity size was normal. Wall thickness was   normal. Systolic function was mildly reduced. The estimated   ejection fraction was in the range of 45% to 50%. Mild diffuse   hypokinesis with no identifiable regional variations. - Ventricular septum: Septal motion showed paradox. - Aortic valve: There was mild regurgitation. - Mitral valve: Systolic bowing without prolapse. There was mild   regurgitation. - Tricuspid valve: Mild prolapse. - Pulmonary arteries: PA peak pressure: 31 mm Hg (S). Impressions: - Second degree AV block and Incessant PVCs are present throughout   the study and limit evaluation of systolic and diastolic   function.    Recent Labs: 12/18/2020: ALT 33; Hemoglobin 14.1; Platelets 190; TSH 1.010 01/15/2021: BUN 9; Creatinine, Ser 0.79; Magnesium 2.0; Potassium 4.7; Sodium 140  12/18/2020: Chol/HDL Ratio 2.5; Cholesterol, Total 112; HDL 45; LDL Chol Calc (NIH) 53; Triglycerides 62   CrCl cannot be calculated (Patient's most recent lab result is older than the maximum 21 days allowed.).   Wt Readings from Last 3 Encounters:  02/25/21 163 lb (73.9 kg)  01/22/21 162 lb (73.5 kg)  01/15/21 160 lb 12.8 oz (72.9 kg)     Other studies reviewed: Additional studies/records reviewed today include: summarized above  ASSESSMENT AND PLAN:  1.  Heart block s/p PPM     Intact function     no programming changes made  2. Mild NICM     98.7%VP, 98.5% effective BP     Recovered LVEF by his last echo     OptiVol looks good though on the rise     No symptoms or exam findings to suggest volume OL     On BB, ARB, aldactone       3. Known hx of PVC's, NSVT MMNSVT      4. Paroxysmal Afib CHA2DS2Vasc is 3, on Eliquis Tikosyn with stable QTc Last AF was back in Nov update labs today   5.  Leg weakness Perhaps better off statin, though not resolved Following with his PMD  6. Fatigue Monitor with maintenance of SR Echo done this year with recovered LVEF Pending titration sleep study PMD                   Disposition: remotes as usual, in clinic in 18mo, sooner if needed    Current medicines are reviewed at length with the patient today.  The patient did not have any concerns regarding medicines.  Haywood Lasso, PA-C 04/22/2021 3:13 PM     [facility] [address] [place] [address] [phone] (office)  [phone] (fax)

## 2021-04-25 ENCOUNTER — Ambulatory Visit (INDEPENDENT_AMBULATORY_CARE_PROVIDER_SITE_OTHER): Payer: Medicare HMO

## 2021-04-25 DIAGNOSIS — I428 Other cardiomyopathies: Secondary | ICD-10-CM

## 2021-04-25 LAB — CUP PACEART REMOTE DEVICE CHECK
Battery Remaining Longevity: 46 mo
Battery Voltage: 2.94 V
Brady Statistic AP VP Percent: 36.5 %
Brady Statistic AP VS Percent: 0.07 %
Brady Statistic AS VP Percent: 62.12 %
Brady Statistic AS VS Percent: 1.31 %
Brady Statistic RA Percent Paced: 37.21 %
Brady Statistic RV Percent Paced: 98.62 %
Date Time Interrogation Session: 20230208002358
Implantable Lead Implant Date: 20170802
Implantable Lead Implant Date: 20170802
Implantable Lead Implant Date: 20170802
Implantable Lead Location: 753858
Implantable Lead Location: 753859
Implantable Lead Location: 753860
Implantable Lead Model: 5076
Implantable Lead Model: 5076
Implantable Pulse Generator Implant Date: 20170802
Lead Channel Impedance Value: 1026 Ohm
Lead Channel Impedance Value: 1045 Ohm
Lead Channel Impedance Value: 1064 Ohm
Lead Channel Impedance Value: 323 Ohm
Lead Channel Impedance Value: 380 Ohm
Lead Channel Impedance Value: 456 Ohm
Lead Channel Impedance Value: 456 Ohm
Lead Channel Impedance Value: 456 Ohm
Lead Channel Impedance Value: 513 Ohm
Lead Channel Impedance Value: 551 Ohm
Lead Channel Impedance Value: 665 Ohm
Lead Channel Impedance Value: 817 Ohm
Lead Channel Impedance Value: 874 Ohm
Lead Channel Impedance Value: 950 Ohm
Lead Channel Pacing Threshold Amplitude: 0.625 V
Lead Channel Pacing Threshold Amplitude: 1.25 V
Lead Channel Pacing Threshold Amplitude: 1.375 V
Lead Channel Pacing Threshold Pulse Width: 0.4 ms
Lead Channel Pacing Threshold Pulse Width: 0.4 ms
Lead Channel Pacing Threshold Pulse Width: 0.8 ms
Lead Channel Sensing Intrinsic Amplitude: 1.375 mV
Lead Channel Sensing Intrinsic Amplitude: 1.375 mV
Lead Channel Sensing Intrinsic Amplitude: 15.25 mV
Lead Channel Sensing Intrinsic Amplitude: 15.25 mV
Lead Channel Setting Pacing Amplitude: 1.5 V
Lead Channel Setting Pacing Amplitude: 1.75 V
Lead Channel Setting Pacing Amplitude: 2.75 V
Lead Channel Setting Pacing Pulse Width: 0.4 ms
Lead Channel Setting Pacing Pulse Width: 0.8 ms
Lead Channel Setting Sensing Sensitivity: 2.8 mV

## 2021-04-26 ENCOUNTER — Encounter: Payer: Self-pay | Admitting: Physician Assistant

## 2021-04-26 ENCOUNTER — Other Ambulatory Visit: Payer: Self-pay

## 2021-04-26 ENCOUNTER — Ambulatory Visit: Payer: Medicare HMO | Admitting: Physician Assistant

## 2021-04-26 VITALS — BP 120/62 | HR 78 | Ht 68.0 in | Wt 168.2 lb

## 2021-04-26 DIAGNOSIS — I442 Atrioventricular block, complete: Secondary | ICD-10-CM

## 2021-04-26 DIAGNOSIS — Z79899 Other long term (current) drug therapy: Secondary | ICD-10-CM | POA: Diagnosis not present

## 2021-04-26 DIAGNOSIS — I428 Other cardiomyopathies: Secondary | ICD-10-CM

## 2021-04-26 DIAGNOSIS — I4729 Other ventricular tachycardia: Secondary | ICD-10-CM | POA: Diagnosis not present

## 2021-04-26 DIAGNOSIS — I452 Bifascicular block: Secondary | ICD-10-CM | POA: Diagnosis not present

## 2021-04-26 DIAGNOSIS — I480 Paroxysmal atrial fibrillation: Secondary | ICD-10-CM | POA: Diagnosis not present

## 2021-04-26 DIAGNOSIS — Z950 Presence of cardiac pacemaker: Secondary | ICD-10-CM | POA: Diagnosis not present

## 2021-04-26 DIAGNOSIS — Z5181 Encounter for therapeutic drug level monitoring: Secondary | ICD-10-CM | POA: Diagnosis not present

## 2021-04-26 LAB — CUP PACEART INCLINIC DEVICE CHECK
Battery Remaining Longevity: 45 mo
Battery Voltage: 2.94 V
Brady Statistic AP VP Percent: 36.02 %
Brady Statistic AP VS Percent: 0.07 %
Brady Statistic AS VP Percent: 62.59 %
Brady Statistic AS VS Percent: 1.3 %
Brady Statistic RA Percent Paced: 35.72 %
Brady Statistic RV Percent Paced: 98.66 %
Date Time Interrogation Session: 20230209181530
Implantable Lead Implant Date: 20170802
Implantable Lead Implant Date: 20170802
Implantable Lead Implant Date: 20170802
Implantable Lead Location: 753858
Implantable Lead Location: 753859
Implantable Lead Location: 753860
Implantable Lead Model: 5076
Implantable Lead Model: 5076
Implantable Pulse Generator Implant Date: 20170802
Lead Channel Impedance Value: 1026 Ohm
Lead Channel Impedance Value: 1045 Ohm
Lead Channel Impedance Value: 1102 Ohm
Lead Channel Impedance Value: 380 Ohm
Lead Channel Impedance Value: 418 Ohm
Lead Channel Impedance Value: 475 Ohm
Lead Channel Impedance Value: 475 Ohm
Lead Channel Impedance Value: 513 Ohm
Lead Channel Impedance Value: 532 Ohm
Lead Channel Impedance Value: 551 Ohm
Lead Channel Impedance Value: 684 Ohm
Lead Channel Impedance Value: 817 Ohm
Lead Channel Impedance Value: 855 Ohm
Lead Channel Impedance Value: 950 Ohm
Lead Channel Pacing Threshold Amplitude: 0.625 V
Lead Channel Pacing Threshold Amplitude: 1.125 V
Lead Channel Pacing Threshold Amplitude: 1.375 V
Lead Channel Pacing Threshold Pulse Width: 0.4 ms
Lead Channel Pacing Threshold Pulse Width: 0.4 ms
Lead Channel Pacing Threshold Pulse Width: 0.8 ms
Lead Channel Sensing Intrinsic Amplitude: 1.375 mV
Lead Channel Sensing Intrinsic Amplitude: 15.25 mV
Lead Channel Sensing Intrinsic Amplitude: 15.25 mV
Lead Channel Sensing Intrinsic Amplitude: 3 mV
Lead Channel Setting Pacing Amplitude: 1.5 V
Lead Channel Setting Pacing Amplitude: 2 V
Lead Channel Setting Pacing Amplitude: 2.5 V
Lead Channel Setting Pacing Pulse Width: 0.4 ms
Lead Channel Setting Pacing Pulse Width: 0.8 ms
Lead Channel Setting Sensing Sensitivity: 2.8 mV

## 2021-04-26 LAB — BASIC METABOLIC PANEL
BUN/Creatinine Ratio: 16 (ref 10–24)
BUN: 14 mg/dL (ref 8–27)
CO2: 26 mmol/L (ref 20–29)
Calcium: 10.3 mg/dL — ABNORMAL HIGH (ref 8.6–10.2)
Chloride: 101 mmol/L (ref 96–106)
Creatinine, Ser: 0.9 mg/dL (ref 0.76–1.27)
Glucose: 94 mg/dL (ref 70–99)
Potassium: 4.3 mmol/L (ref 3.5–5.2)
Sodium: 139 mmol/L (ref 134–144)
eGFR: 93 mL/min/{1.73_m2} (ref >59)

## 2021-04-26 LAB — CBC
Hematocrit: 40.1 % (ref 37.5–51.0)
Hemoglobin: 13.8 g/dL (ref 13.0–17.7)
MCH: 31.4 pg (ref 26.6–33.0)
MCHC: 34.4 g/dL (ref 31.5–35.7)
MCV: 91 fL (ref 79–97)
Platelets: 182 10*3/uL (ref 150–450)
RBC: 4.39 x10E6/uL (ref 4.14–5.80)
RDW: 12.2 % (ref 11.6–15.4)
WBC: 6 10*3/uL (ref 3.4–10.8)

## 2021-04-26 LAB — MAGNESIUM: Magnesium: 2 mg/dL (ref 1.6–2.3)

## 2021-04-26 NOTE — Patient Instructions (Addendum)
°   Medication Instructions:   Your physician recommends that you continue on your current medications as directed. Please refer to the Current Medication list given to you today.   *If you need a refill on your cardiac medications before your next appointment, please call your pharmacy*   Lab Work:  BMET [place]    If you have labs (blood work) drawn today and your tests are completely normal, you will receive your results only by: Manuel Garcia (if you have MyChart) OR A paper copy in the mail If you have any lab test that is abnormal or we need to change your treatment, we will call you to review the results.   Testing/Procedures: NONE ORDERED  TODAY     Follow-Up: At [facility], you and your health needs are our priority.  As part of our continuing mission to provide you with exceptional heart care, we have created designated Provider Care Teams.  These Care Teams include your primary Cardiologist (physician) and Advanced Practice Providers (APPs -  Physician Assistants and Nurse Practitioners) who all work together to provide you with the care you need, when you need it.  We recommend signing up for the patient portal called "MyChart".  Sign up information is provided on this After Visit Summary.  MyChart is used to connect with patients for Virtual Visits (Telemedicine).  Patients are able to view lab/test results, encounter notes, upcoming appointments, etc.  Non-urgent messages can be sent to your provider as well.   To learn more about what you can do with MyChart, go to [email or web address].    Your next appointment:   3 month(s)  The format for your next appointment:   In Person  Provider:   You may see Virl Axe, MD   :1 Other Instructions

## 2021-04-27 ENCOUNTER — Encounter: Payer: Medicare HMO | Admitting: Physician Assistant

## 2021-04-30 NOTE — Progress Notes (Signed)
Remote pacemaker transmission.   

## 2021-05-01 IMAGING — CT CT HEAD W/O CM
3 of 4 series · 15 of 47 positions shown, 18 images · non-contrast
Comparison: CT head 08/17/2006

CLINICAL DATA: Memory loss

EXAM:
CT HEAD WITHOUT CONTRAST
TECHNIQUE: Contiguous axial images were obtained from the base of the skull
through the vertex without intravenous contrast.

[Series 2: head 5.00 hr40 s3 axial ibhc · axial · 0.44mm/px · z∈[-622,-487]mm · 9 of 33 slices shown, 12 images]
[im 3/33  brain]
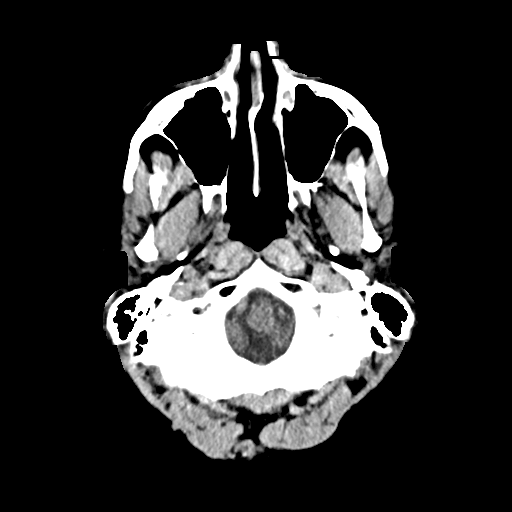
[im 3/33  bone]
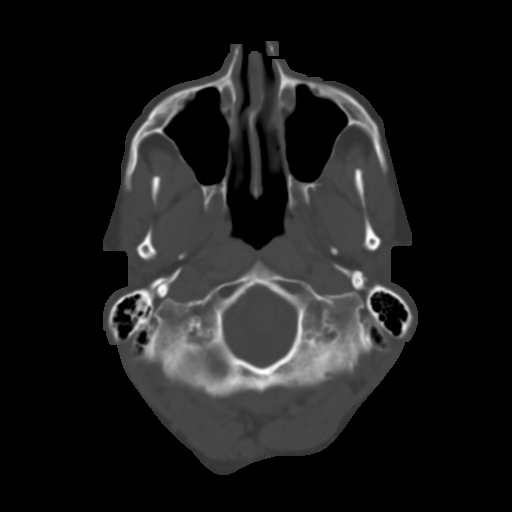
[im 7/33  brain]
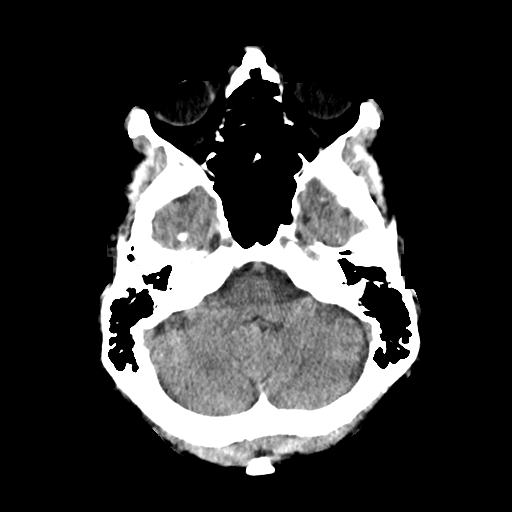
[im 10/33  brain]
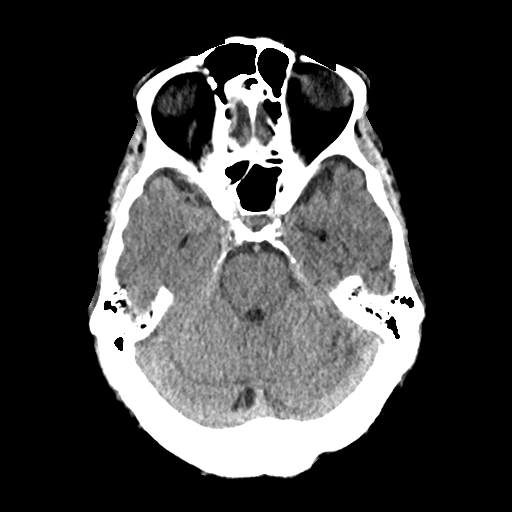
[im 14/33  brain]
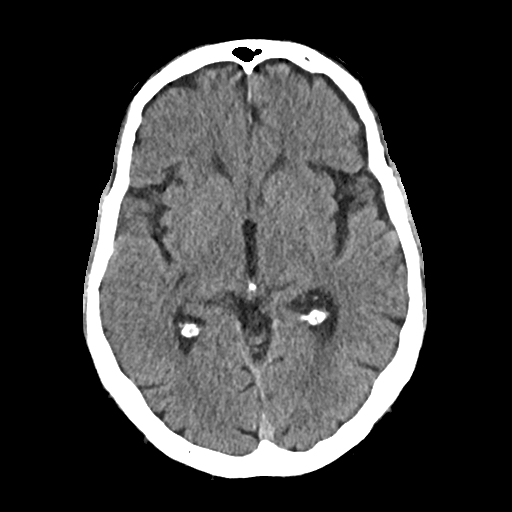
[im 17/33  brain]
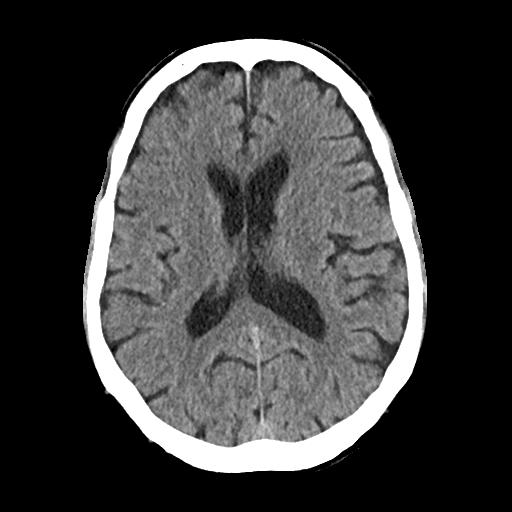
[im 17/33  bone]
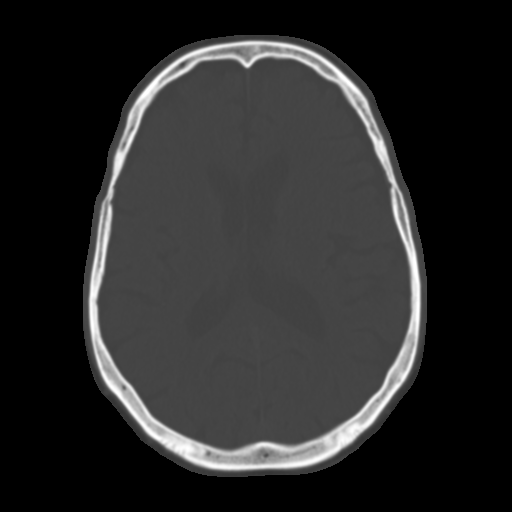
[im 19/33  brain]
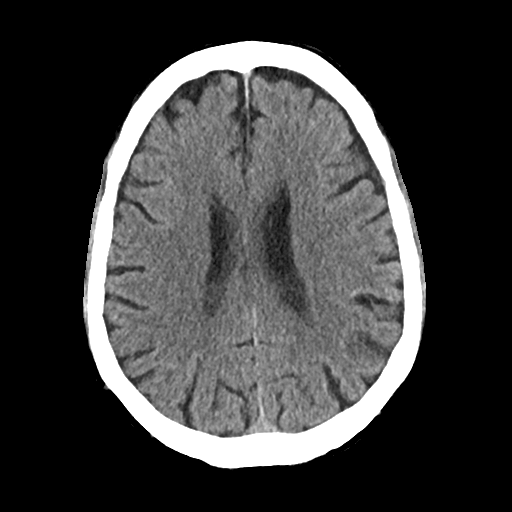
[im 23/33  brain]
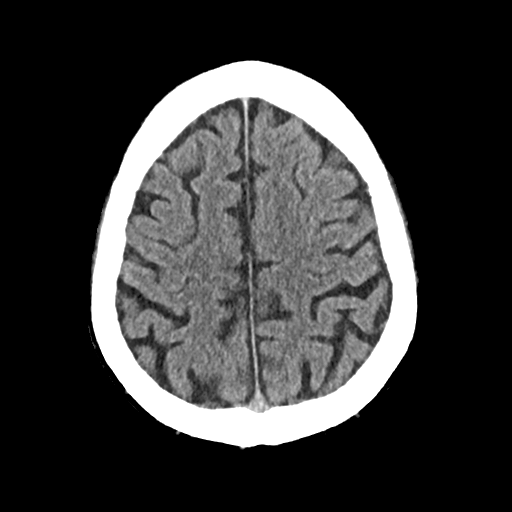
[im 26/33  brain]
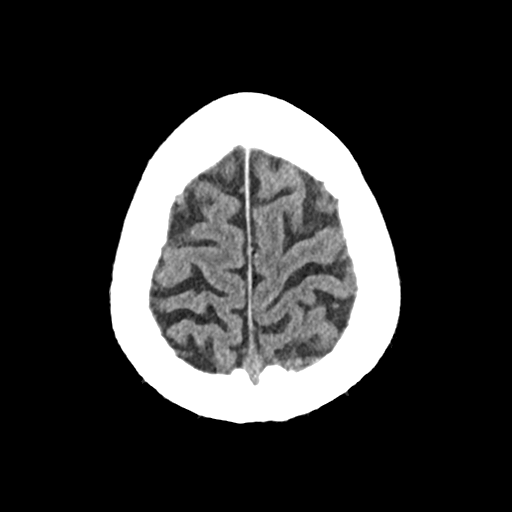
[im 30/33  brain]
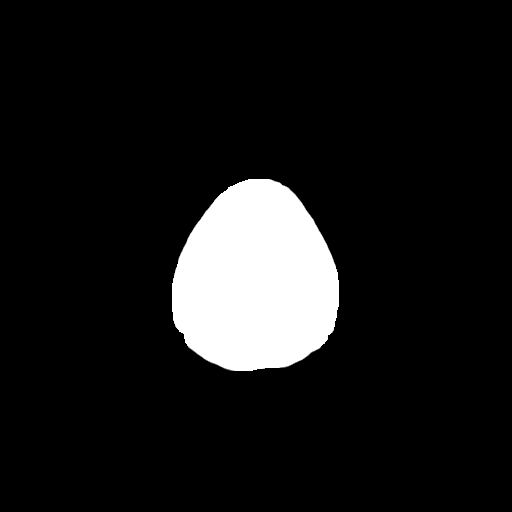
[im 30/33  bone]
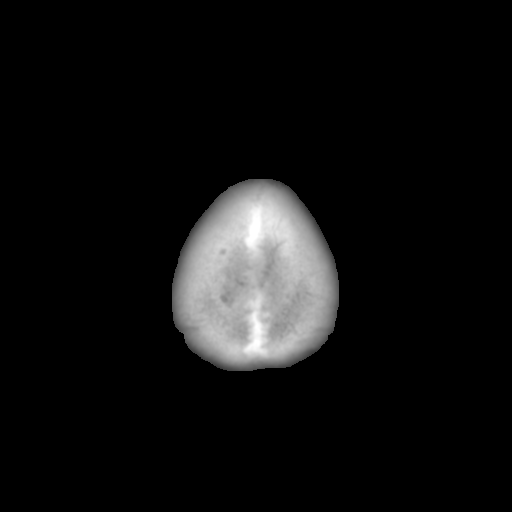

[Series 4: head 3.00 hr40 s3 sag · sagittal · 0.32mm/px · 3 of 64 slices shown]
[im 22/64  brain]
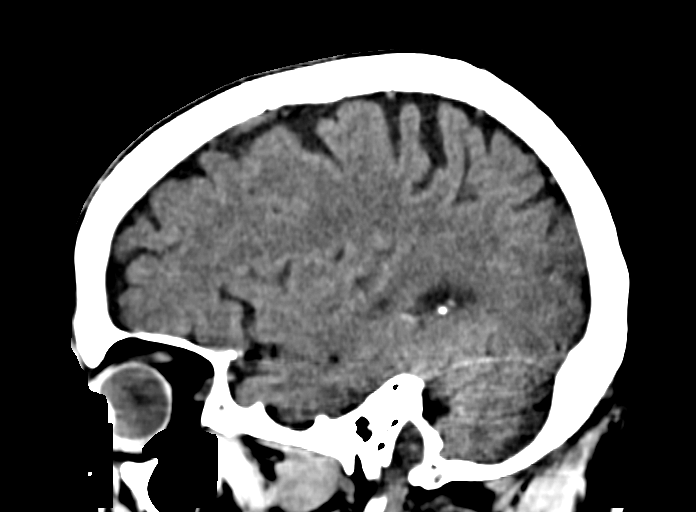
[im 32/64  brain]
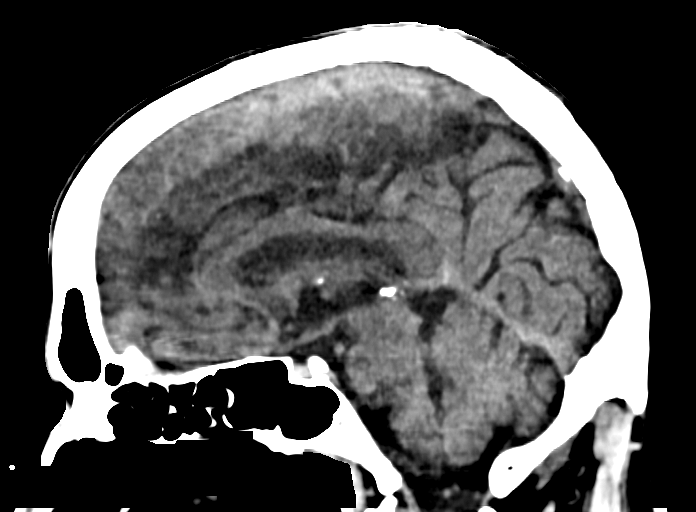
[im 43/64  brain]
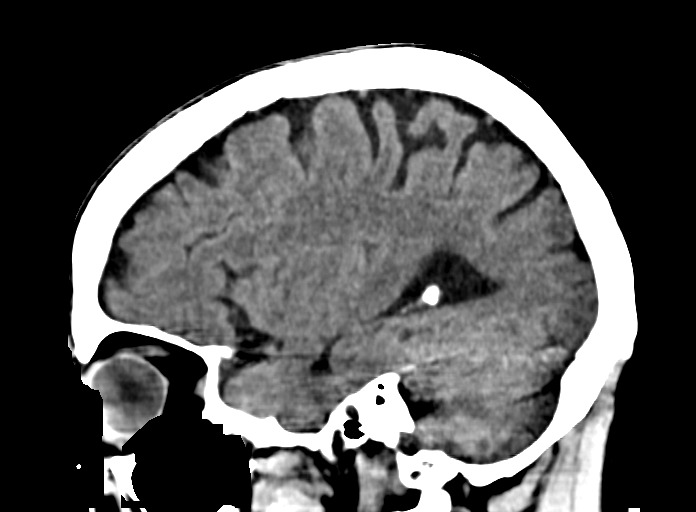

[Series 6: head 3.00 hr40 s3 cor · coronal · 0.32mm/px · 3 of 74 slices shown]
[im 25/74  brain]
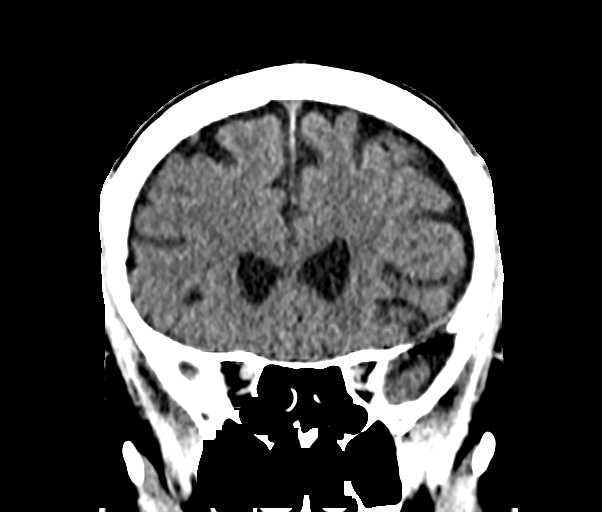
[im 33/74  brain]
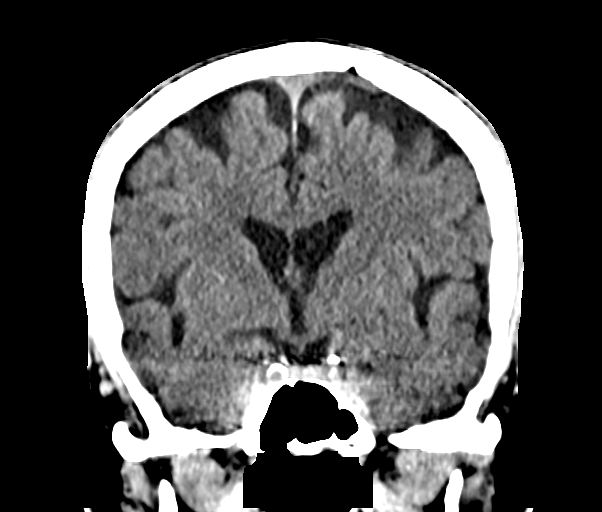
[im 41/74  brain]
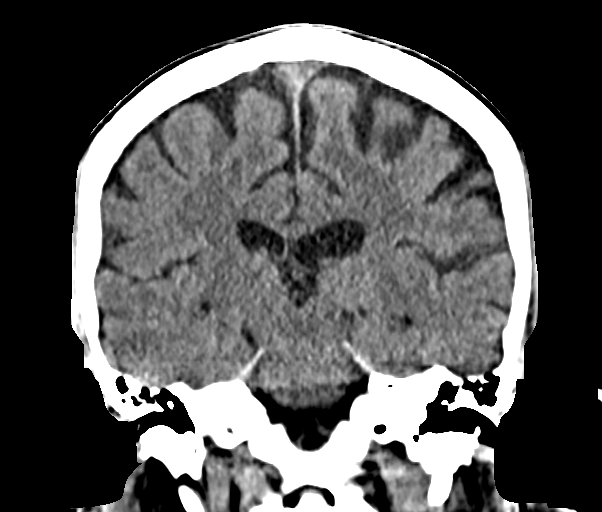

[15 of 47 positions shown; findings below may reference images not displayed]

FINDINGS: Brain: Age-appropriate atrophy, with progression since 0333.
Negative for hydrocephalus. Negative for acute infarct, hemorrhage,
mass. Small chronic lacunar infarction in the left anterior frontal
white matter.

Vascular: Negative for hyperdense vessel

Skull: Negative

Sinuses/Orbits: Paranasal sinuses clear. Left cataract extraction.
No orbital lesion.

Other: None
IMPRESSION: No acute abnormality. Age-appropriate atrophy. Small chronic infarct
left frontal white matter.

## 2021-05-01 NOTE — Progress Notes (Signed)
Established patient visit   Patient: George [name]   DOB: [birth year]   71 y.o. Male  MRN: [id number] Visit Date: 05/03/2021  Chief Complaint  Patient presents with   Follow-up    Mood   Hypertension   Hyperlipidemia   Subjective    HPI HPI     Follow-up    Additional comments: Mood      Last edited by Aron Baba, CMA on 05/03/2021  9:14 AM.      Patient presents for follow up hypertension and hyperlipidemia. Patient has c/o pain with hard stool and sometimes notices some bright red blood when wiping. No fever, chills, or night sweats. Also complains of raw sore throat. No fever, swollen lymph nodes, postnasal drainage, cough or runny nose. Patient is scheduled for CPAP titration next month.   HTN: Pt denies chest pain, palpitations, dizziness or lower extremity swelling. Taking medication as directed without side effects. Has not checked BP at home.  HLD: Pt reports stopped statin therapy (Lipitor) due to joint pain and muscle aches. States continues to have intermittent lower extremity weakness sensation.   Mood: Patient reports mood has been stable. Does have some anxiety but no severe episodes or panic attacks.  Medications: Outpatient Medications Prior to Visit  Medication Sig   carvedilol (COREG) 25 MG tablet Take 1 tablet (25 mg total) by mouth 2 (two) times daily with a meal.   Cholecalciferol (VITAMIN D3) 5000 units TABS 5,000 IU OTC vitamin D3 daily.   clotrimazole-betamethasone (LOTRISONE) cream Apply 1 application topically as needed (for skin).   dofetilide (TIKOSYN) 500 MCG capsule Take 1 capsule (500 mcg total) by mouth 2 (two) times daily.   ELIQUIS 5 MG TABS tablet TAKE 1 TABLET BY MOUTH TWICE A DAY   fexofenadine (ALLEGRA) 180 MG tablet TAKE 1 TABLET (180 MG TOTAL) BY MOUTH DAILY. DURING ALLERGY SEASON   fluticasone (FLONASE) 50 MCG/ACT nasal spray USE 1 SPRAY IN EACH NOSTRIL AFTER SINUS RINSES TWICE DAILY.   furosemide (LASIX) 20 MG tablet Take 20  mg by mouth daily.   losartan (COZAAR) 25 MG tablet Take 0.5 tablets (12.5 mg total) by mouth daily.   Multiple Vitamins-Minerals (CENTRUM ADULTS) TABS Take 1 tablet by mouth daily.   spironolactone (ALDACTONE) 25 MG tablet Take 1 tablet (25 mg total) by mouth daily. Please keep upcoming appointment in February 2023 for future refills. Thank you   tamsulosin (FLOMAX) 0.4 MG CAPS capsule Take 0.4 mg by mouth daily.   No facility-administered medications prior to visit.    Review of Systems A fourteen system review of systems was performed and found to be positive as per HPI.     Objective    BP 130/83    Pulse 74    Temp (!) 97.3 F (36.3 C)    Ht 5' 8"  (1.727 m)    Wt 166 lb (75.3 kg)    SpO2 98%    BMI 25.24 kg/m  BP Readings from Last 3 Encounters:  05/03/21 130/83  04/26/21 120/62  01/22/21 132/76   Wt Readings from Last 3 Encounters:  05/03/21 166 lb (75.3 kg)  04/26/21 168 lb 3.2 oz (76.3 kg)  02/25/21 163 lb (73.9 kg)    Physical Exam  General:  Well Developed, well nourished, appropriate for stated age.  Neuro:  Alert and oriented,  extra-ocular muscles intact  HEENT:  Normocephalic, atraumatic, no sinus tenderness, pink and moist nasal mucosa, mild erythema of posterior oropharynx, no tonsillar swelling or exudates,  neck supple, no cervical adenopathy   Skin:  no gross rash, warm, pink. Cardiac:  RRR, S1 S2, no JVD Respiratory: CTA B/L  GU: small-moderate posterior midline tear with small external hemorrhoid Vascular:  Ext warm, no cyanosis apprec.; cap RF less 2 sec. Psych:  No HI/SI, judgement and insight good, Euthymic mood. Full Affect.   No results found for any visits on 05/03/21.  Assessment & Plan      Problem List Items Addressed This Visit       Cardiovascular and Mediastinum   Essential hypertension (Chronic)   Relevant Orders   Comp Met (CMET)     Other   Hyperlipidemia LDL goal <100 - Primary   Relevant Orders   Lipid Profile   Other  Visit Diagnoses     OSA (obstructive sleep apnea)       Prediabetes       Relevant Orders   HgB A1c      OSA: -Recommend to follow up as scheduled for CPAP titration. Discussed with patient untreated sleep apnea possibly contributing to fatigue.  External hemorrhoids, Anal fissure: -Hemoccult card negative. Discussed with patient increasing dietary fiber and can trial taking Metamucil to improve stool consistency and reduce hard stools. Use Anusol 2.5% cream as directed.  Sore throat: -Reassurance provided no signs of strep pharyngitis present at this time. Recommend to monitor symptoms and if sore throat worsens or develops new symptoms such as fever then recommend testing for strep pharyngitis. Recommend warm salt water gargles.    Return in about 4 months (around 08/31/2021) for HTN, HLD, Mood .        Lorrene Reid, PA-C  [facility] [phone] (phone) [phone] (fax)  [place]

## 2021-05-03 ENCOUNTER — Other Ambulatory Visit: Payer: Self-pay

## 2021-05-03 ENCOUNTER — Ambulatory Visit (INDEPENDENT_AMBULATORY_CARE_PROVIDER_SITE_OTHER): Payer: Medicare HMO | Admitting: Physician Assistant

## 2021-05-03 ENCOUNTER — Encounter: Payer: Self-pay | Admitting: Physician Assistant

## 2021-05-03 VITALS — BP 130/83 | HR 74 | Temp 97.3°F | Ht 68.0 in | Wt 166.0 lb

## 2021-05-03 DIAGNOSIS — K649 Unspecified hemorrhoids: Secondary | ICD-10-CM | POA: Diagnosis not present

## 2021-05-03 DIAGNOSIS — K602 Anal fissure, unspecified: Secondary | ICD-10-CM

## 2021-05-03 DIAGNOSIS — I10 Essential (primary) hypertension: Secondary | ICD-10-CM

## 2021-05-03 DIAGNOSIS — J029 Acute pharyngitis, unspecified: Secondary | ICD-10-CM | POA: Diagnosis not present

## 2021-05-03 DIAGNOSIS — E785 Hyperlipidemia, unspecified: Secondary | ICD-10-CM | POA: Diagnosis not present

## 2021-05-03 DIAGNOSIS — G4733 Obstructive sleep apnea (adult) (pediatric): Secondary | ICD-10-CM | POA: Diagnosis not present

## 2021-05-03 DIAGNOSIS — R7303 Prediabetes: Secondary | ICD-10-CM | POA: Diagnosis not present

## 2021-05-03 LAB — HEMOCCULT GUIAC POC 1CARD (OFFICE): Fecal Occult Blood, POC: NEGATIVE

## 2021-05-03 MED ORDER — HYDROCORTISONE (PERIANAL) 2.5 % EX CREA: 1.0000 "application " | TOPICAL_CREAM | Freq: Two times a day (BID) | CUTANEOUS | 0 refills | Status: DC

## 2021-05-03 NOTE — Assessment & Plan Note (Signed)
-  Stable. -Pt on Losartan 12.5 mg and Spironolactone 25 mg. -Will collect CMP for medication monitoring. -Will continue to monitor alongside cardiology.

## 2021-05-03 NOTE — Assessment & Plan Note (Signed)
Last lipid panel wnl's, LDL 53. Pt discontinued atorvastatin 40 mg due to side effects and does not want to trial another statin. Will repeat lipid panel and pending lab results recommend starting non-statin such as Zetia. Recommend to follow a low fat diet.

## 2021-05-03 NOTE — Patient Instructions (Signed)
Anal Fissure, Adult An anal fissure is a small tear or crack in the tissue around the opening of the butt (anus). Bleeding from the tear or crack usually stops on its own within a few minutes. The bleeding may happen every time you poop (have a bowel movement) until the tear or crack heals. What are the causes? This condition is usually caused by passing a large or hard poop (stool). Other causes include: Trouble pooping (constipation). Passing watery poop (diarrhea). Inflammatory bowel disease (Crohn's disease or ulcerative colitis). Childbirth. Infections. Anal sex. What are the signs or symptoms? Symptoms of this condition include: Bleeding from the butt. Small amounts of blood on your poop. The blood coats the outside of the poop. It is not mixed with the poop. Small amounts of blood on the toilet paper or in the toilet after you poop. Pain when passing poop. Itching or irritation around the opening of the butt. How is this diagnosed? This condition may be diagnosed based on a physical exam. Your doctor may: Check your butt. A tear can often be seen by checking the area with care. Check your butt using a short tube (anoscope). The light in the tube will show any problems in your butt. How is this treated? Treatment for this condition may include: Treating problems that make it hard for you to pass poop. You may be told to: Eat more fiber. Drink more fluid. Take fiber supplements. Take medicines that make poop soft. Taking warm water baths (sitz baths). This may help to heal the tear. Using creams and ointments. If your condition gets worse, other treatments may be needed such as: A shot near the tear or crack (botulinum injection). Surgery to repair the tear or crack. Follow these instructions at home: Eating and drinking  Avoid bananas and dairy products. These foods can make it hard to poop. Drink enough fluid to keep your pee (urine) pale yellow. Eat foods that have a lot  of fiber in them, such as: Beans. Whole grains. Fresh fruits. Fresh vegetables. General instructions  Take over-the-counter and prescription medicines only as told by your doctor. Use creams or ointments only as told by your doctor. Keep the butt area as clean and dry as you can. Take a warm water bath as told by your doctor. Do not use soap. Keep all follow-up visits as told by your doctor. This is important. Contact a doctor if: You have more bleeding. You have a fever. You have watery poop that is mixed with blood. You have pain. Your problem gets worse, not better. Summary An anal fissure is a small tear or crack in the skin around the opening of the butt (anus). This condition is usually caused by passing a large or hard poop (stool). Treatment includes treating the problems that make it hard for you to pass poop. Follow your doctor's instructions about caring for your condition at home. Keep all follow-up visits as told by your doctor. This is important. This information is not intended to replace advice given to you by your health care provider. Make sure you discuss any questions you have with your health care provider. Document Revised: 09/28/2020 Document Reviewed: 09/28/2020 Elsevier Patient Education  [place].

## 2021-05-04 ENCOUNTER — Other Ambulatory Visit (INDEPENDENT_AMBULATORY_CARE_PROVIDER_SITE_OTHER): Payer: Medicare HMO

## 2021-05-04 ENCOUNTER — Telehealth: Payer: Self-pay | Admitting: Physician Assistant

## 2021-05-04 DIAGNOSIS — J029 Acute pharyngitis, unspecified: Secondary | ICD-10-CM

## 2021-05-04 DIAGNOSIS — E785 Hyperlipidemia, unspecified: Secondary | ICD-10-CM

## 2021-05-04 DIAGNOSIS — R051 Acute cough: Secondary | ICD-10-CM | POA: Diagnosis not present

## 2021-05-04 LAB — COMPREHENSIVE METABOLIC PANEL
ALT: 16 IU/L (ref 0–44)
AST: 18 IU/L (ref 0–40)
Albumin/Globulin Ratio: 1.5 (ref 1.2–2.2)
Albumin: 4.5 g/dL (ref 3.8–4.8)
Alkaline Phosphatase: 89 IU/L (ref 44–121)
BUN/Creatinine Ratio: 14 (ref 10–24)
BUN: 14 mg/dL (ref 8–27)
Bilirubin Total: 1.6 mg/dL — ABNORMAL HIGH (ref 0.0–1.2)
CO2: 26 mmol/L (ref 20–29)
Calcium: 10.2 mg/dL (ref 8.6–10.2)
Chloride: 101 mmol/L (ref 96–106)
Creatinine, Ser: 0.98 mg/dL (ref 0.76–1.27)
Globulin, Total: 3 g/dL (ref 1.5–4.5)
Glucose: 102 mg/dL — ABNORMAL HIGH (ref 70–99)
Potassium: 4.9 mmol/L (ref 3.5–5.2)
Sodium: 138 mmol/L (ref 134–144)
Total Protein: 7.5 g/dL (ref 6.0–8.5)
eGFR: 84 mL/min/{1.73_m2} (ref >59)

## 2021-05-04 LAB — LIPID PANEL
Chol/HDL Ratio: 4 ratio (ref 0.0–5.0)
Cholesterol, Total: 224 mg/dL — ABNORMAL HIGH (ref 100–199)
HDL: 56 mg/dL (ref >39)
LDL Chol Calc (NIH): 144 mg/dL — ABNORMAL HIGH (ref 0–99)
Triglycerides: 134 mg/dL (ref 0–149)
VLDL Cholesterol Cal: 24 mg/dL (ref 5–40)

## 2021-05-04 LAB — POCT INFLUENZA A/B
Influenza A, POC: NEGATIVE
Influenza B, POC: NEGATIVE

## 2021-05-04 LAB — POCT RAPID STREP A (OFFICE): Rapid Strep A Screen: NEGATIVE

## 2021-05-04 LAB — HEMOGLOBIN A1C
Est. average glucose Bld gHb Est-mCnc: 114 mg/dL
Hgb A1c MFr Bld: 5.6 % (ref 4.8–5.6)

## 2021-05-04 MED ORDER — EZETIMIBE 10 MG PO TABS: 10.0000 mg | ORAL_TABLET | Freq: Every day | ORAL | 1 refills | Status: DC

## 2021-05-04 NOTE — Telephone Encounter (Signed)
Patient called stating his throat is hurting worse today. With the weekend coming up he would like RX of Zpack sent to [place].    AS, CMA

## 2021-05-04 NOTE — Telephone Encounter (Signed)
Patient coming in this morning for rapid strep nurse visit. AS, CMA

## 2021-05-04 NOTE — Addendum Note (Signed)
Addended by: Aron Baba on: 05/04/2021 10:18 AM   Modules accepted: Orders

## 2021-05-07 ENCOUNTER — Encounter: Payer: Self-pay | Admitting: Internal Medicine

## 2021-05-07 LAB — CULTURE, GROUP A STREP: Strep A Culture: NEGATIVE

## 2021-05-07 MED ORDER — DOFETILIDE 500 MCG PO CAPS: 500.0000 ug | ORAL_CAPSULE | Freq: Two times a day (BID) | ORAL | 1 refills | Status: DC

## 2021-05-17 ENCOUNTER — Encounter (HOSPITAL_BASED_OUTPATIENT_CLINIC_OR_DEPARTMENT_OTHER): Payer: Self-pay | Admitting: Internal Medicine

## 2021-05-17 ENCOUNTER — Ambulatory Visit (HOSPITAL_BASED_OUTPATIENT_CLINIC_OR_DEPARTMENT_OTHER): Payer: Medicare HMO | Attending: Physician Assistant | Admitting: Internal Medicine

## 2021-05-17 ENCOUNTER — Other Ambulatory Visit: Payer: Self-pay

## 2021-05-17 DIAGNOSIS — G4733 Obstructive sleep apnea (adult) (pediatric): Secondary | ICD-10-CM

## 2021-05-17 HISTORY — DX: Obstructive sleep apnea (adult) (pediatric): G47.33

## 2021-05-20 DIAGNOSIS — G4733 Obstructive sleep apnea (adult) (pediatric): Secondary | ICD-10-CM

## 2021-05-20 NOTE — Procedures (Signed)
? ? ?  Patient Name: George [name], George [name] ?Study Date: 05/17/2021 ?Gender: Male ?D.O.B: 15-May-1950 ?Age (years): 31 ?Referring Provider: Lorrene Reid PA-C ?Height (inches): 68 ?Interpreting Physician: Baird Lyons MD, ABSM ?Weight (lbs): 163 ?RPSGT: Laren Everts ?BMI: 25 ?MRN: [id number] ?Neck Size: 16.00 ? ?CLINICAL INFORMATION ?The patient is referred for a BiPAP titration to treat sleep apnea. ? ?Date of NPSG, Split Night or HST: NPSG  02/25/21  AHI 10/ hr, desaturation to 76%, body weight 163 lbs ? ?SLEEP STUDY TECHNIQUE ?As per the AASM Manual for the Scoring of Sleep and Associated Events v2.3 (April 2016) with a hypopnea requiring 4% desaturations. ? ?The channels recorded and monitored were frontal, central and occipital EEG, electrooculogram (EOG), submentalis EMG (chin), nasal and oral airflow, thoracic and abdominal wall motion, anterior tibialis EMG, snore microphone, electrocardiogram, and pulse oximetry. Bilevel positive airway pressure (BPAP) was initiated at the beginning of the study and titrated to treat sleep-disordered breathing. ? ?MEDICATIONS ?Medications self-administered by patient taken the night of the study : none reported ? ?RESPIRATORY PARAMETERS ?Optimal IPAP Pressure (cm):  ?AHI at Optimal Pressure (/hr) N/A ?Optimal EPAP Pressure (cm):  ? ? ?Overall Minimal O2 (%): 81.0 Minimal O2 at Optimal Pressure (%): 81.0 ?SLEEP ARCHITECTURE ?Start Time: 10:47:02 PM Stop Time: 1:52:59 AM Total Time (min): 185.9 Total Sleep Time (min): 97 ?Sleep Latency (min): 3.4 Sleep Efficiency (%): 52.2% REM Latency (min): N/A WASO (min): 85.6 ?Stage N1 (%): 39.2% Stage N2 (%): 60.8% Stage N3 (%): 0.0% Stage R (%): 0 ?Supine (%): 100.00 Arousal Index (/hr): 47.0  ? ?CARDIAC DATA ?The 2 lead EKG demonstrated pacemaker generated. The mean heart rate was 59.6 beats per minute. Other EKG findings include: PVCs. ? ?LEG MOVEMENT DATA ?The total Periodic Limb Movements of Sleep (PLMS) were 0. The PLMS index was 0.0. A  PLMS index of <15 is considered normal in adults. ? ?IMPRESSIONS ?- An optimal PAP pressure could not be selected for this patient based on the available study data. Initial CPAP titration and mask change were not well tolerated and  changed to BIPAP titration. Tech reported this was also associated by the patient with severe anxiety and complaint of "chest fullness". Patient asked to end study at 01:52 due to anxiety. ?- Mild Central Sleep Apnea was noted during this titration (CAI = 8.7/h). ?- Moderate oxygen desaturations were observed during this titration (min O2 = 81.0%). ?- No snoring was audible during this study. ?- 2-lead EKG demonstrated: PVCs ?- Clinically significant periodic limb movements were not noted during this study. Arousals associated with PLMs were rare. ? ?DIAGNOSIS ?- Obstructive Sleep Apnea (G47.33) ? ?RECOMMENDATIONS ?- Recommend a trial of Auto-BiPAP 6 - 18 cm H2O.  ?- Patient may benefit from daytime return to Sleep Center for "CPAP mask desensitization with PAP Nap". ?- Sleep Medicine consultation for treatment option assessment is available if helpful.. ?- Sleep hygiene should be reviewed to assess factors that may improve sleep quality. ?- Weight management and regular exercise should be initiated or continued. ? ?[Electronically signed] 05/20/2021 12:52 PM ? ?Baird Lyons MD, ABSM ?Diplomate, [place] Board of Sleep Medicine ? ? ?NPI: [id number] ? ?  ? ? ? ? ? ? ? ? ? ? ? ? ? ? ? ? ? ? ? ? ? ?George [name] ?Diplomate, Tax adviser of Sleep Medicine ? ?ELECTRONICALLY SIGNED ON:  05/20/2021, 12:39 PM ?[place] ?PH: [phone]   FX: [phone] ?ACCREDITED BY THE AMERICAN ACADEMY OF SLEEP MEDICINE ?

## 2021-05-26 ENCOUNTER — Other Ambulatory Visit: Payer: Self-pay | Admitting: Physician Assistant

## 2021-05-26 DIAGNOSIS — E785 Hyperlipidemia, unspecified: Secondary | ICD-10-CM

## 2021-05-28 ENCOUNTER — Telehealth: Payer: Self-pay | Admitting: Physician Assistant

## 2021-05-28 NOTE — Telephone Encounter (Signed)
Patient is aware 

## 2021-05-28 NOTE — Telephone Encounter (Signed)
Patients wife is calling wanting results to sleep study and wants to know if he is going to need a CPAP? Please advise. [phone] ?

## 2021-06-01 ENCOUNTER — Other Ambulatory Visit: Payer: Self-pay

## 2021-06-01 DIAGNOSIS — G4733 Obstructive sleep apnea (adult) (pediatric): Secondary | ICD-10-CM

## 2021-06-04 ENCOUNTER — Other Ambulatory Visit: Payer: Self-pay | Admitting: Physician Assistant

## 2021-06-04 ENCOUNTER — Other Ambulatory Visit (HOSPITAL_COMMUNITY): Payer: Self-pay | Admitting: *Deleted

## 2021-06-04 MED ORDER — APIXABAN 5 MG PO TABS: 5.0000 mg | ORAL_TABLET | Freq: Two times a day (BID) | ORAL | 6 refills | Status: DC

## 2021-06-04 MED ORDER — LOSARTAN POTASSIUM 25 MG PO TABS: 12.5000 mg | ORAL_TABLET | Freq: Every day | ORAL | 3 refills | Status: DC

## 2021-06-12 DIAGNOSIS — G4733 Obstructive sleep apnea (adult) (pediatric): Secondary | ICD-10-CM | POA: Diagnosis not present

## 2021-06-19 ENCOUNTER — Other Ambulatory Visit: Payer: Self-pay | Admitting: Physician Assistant

## 2021-06-19 DIAGNOSIS — E785 Hyperlipidemia, unspecified: Secondary | ICD-10-CM

## 2021-07-11 ENCOUNTER — Other Ambulatory Visit: Payer: Self-pay | Admitting: Physician Assistant

## 2021-07-11 DIAGNOSIS — E785 Hyperlipidemia, unspecified: Secondary | ICD-10-CM

## 2021-07-12 NOTE — Patient Instructions (Addendum)
Living With Sleep Apnea Sleep apnea is a condition in which breathing pauses or becomes shallow during sleep. Sleep apnea is most commonly caused by a collapsed or blocked airway. People with sleep apnea usually snore loudly. They may have times when they gasp and stop breathing for 10 seconds or more during sleep. This may happen many times during the night. The breaks in breathing also interrupt the deep sleep that you need to feel rested. Even if you do not completely wake up from the gaps in breathing, your sleep may not be restful and you feel tired during the day. You may also have a headache in the morning and low energy during the day, and you may feel anxious or depressed. How can sleep apnea affect me? Sleep apnea increases your chances of extreme tiredness during the day (daytime fatigue). It can also increase your risk for health conditions, such as: Heart attack. Stroke. Obesity. Type 2 diabetes. Heart failure. Irregular heartbeat. High blood pressure. If you have daytime fatigue as a result of sleep apnea, you may be more likely to: Perform poorly at school or work. Fall asleep while driving. Have difficulty with attention. Develop depression or anxiety. Have sexual dysfunction. What actions can I take to manage sleep apnea? Sleep apnea treatment  If you were given a device to open your airway while you sleep, use it only as told by your health care provider. You may be given: An oral appliance. This is a custom-made mouthpiece that shifts your lower jaw forward. A continuous positive airway pressure (CPAP) device. This device blows air through a mask when you breathe out (exhale). A nasal expiratory positive airway pressure (EPAP) device. This device has valves that you put into each nostril. A bi-level positive airway pressure (BIPAP) device. This device blows air through a mask when you breathe in (inhale) and breathe out (exhale). You may need surgery if other treatments  do not work for you. Sleep habits Go to sleep and wake up at the same time every day. This helps set your internal clock (circadian rhythm) for sleeping. If you stay up later than usual, such as on weekends, try to get up in the morning within 2 hours of your normal wake time. Try to get at least 7-9 hours of sleep each night. Stop using a computer, tablet, and mobile phone a few hours before bedtime. Do not take long naps during the day. If you nap, limit it to 30 minutes. Have a relaxing bedtime routine. Reading or listening to music may relax you and help you sleep. Use your bedroom only for sleep. Keep your television and computer out of your bedroom. Keep your bedroom cool, dark, and quiet. Use a supportive mattress and pillows. Follow your health care provider's instructions for other changes to sleep habits. Nutrition Do not eat heavy meals in the evening. Do not have caffeine in the later part of the day. The effects of caffeine can last for more than 5 hours. Follow your health care provider's or dietitian's instructions for any diet changes. Lifestyle     Do not drink alcohol before bedtime. Alcohol can cause you to fall asleep at first, but then it can cause you to wake up in the middle of the night and have trouble getting back to sleep. Do not use any products that contain nicotine or tobacco. These products include cigarettes, chewing tobacco, and vaping devices, such as e-cigarettes. If you need help quitting, ask your health care provider. Medicines Take   over-the-counter and prescription medicines only as told by your health care provider. Do not use over-the-counter sleep medicine. You can become dependent on this medicine, and it can make sleep apnea worse. Do not use medicines, such as sedatives and narcotics, unless told by your health care provider. Activity Exercise on most days, but avoid exercising in the evening. Exercising near bedtime can interfere with  sleeping. If possible, spend time outside every day. Natural light helps regulate your circadian rhythm. General information Lose weight if you need to, and maintain a healthy weight. Keep all follow-up visits. This is important. If you are having surgery, make sure to tell your health care provider that you have sleep apnea. You may need to bring your device with you. Where to find more information Learn more about sleep apnea and daytime fatigue from: American Sleep Association: sleepassociation.org National Sleep Foundation: sleepfoundation.org National Heart, Lung, and Blood Institute: nhlbi.nih.gov Summary Sleep apnea is a condition in which breathing pauses or becomes shallow during sleep. Sleep apnea can cause daytime fatigue and other serious health conditions. You may need to wear a device while sleeping to help keep your airway open. If you are having surgery, make sure to tell your health care provider that you have sleep apnea. You may need to bring your device with you. Making changes to sleep habits, diet, lifestyle, and activity can help you manage sleep apnea. This information is not intended to replace advice given to you by your health care provider. Make sure you discuss any questions you have with your health care provider. Document Revised: 10/11/2020 Document Reviewed: 02/11/2020 Elsevier Patient Education  2023 Elsevier Inc.  

## 2021-07-13 DIAGNOSIS — G4733 Obstructive sleep apnea (adult) (pediatric): Secondary | ICD-10-CM | POA: Diagnosis not present

## 2021-07-13 NOTE — Progress Notes (Signed)
? ?Established Patient Office Visit ? ?Subjective   ?Patient ID: George [name], male    DOB: 1951/03/13  Age: 71 y.o. MRN: [id number] ? ?No chief complaint on file. ? ? ?HPI ?Patient reports has been able to use CPAP machine every night last week for 6-7 hours. Reports bought a smaller mask which is working much better than the larger one. Is not as sleepy in the evenings as he used to be. States settings are set on auto.  ? ? ?Patient Active Problem List  ? Diagnosis Date Noted  ? OSA (obstructive sleep apnea) 05/17/2021  ? Bilateral impacted cerumen 08/01/2020  ? Seasonal allergic rhinitis 08/01/2020  ? Persistent atrial fibrillation ([place]) 07/21/2020  ? Secondary hypercoagulable state ([place]) 07/21/2020  ? Atrial tachycardia ([place]) 04/16/2020  ? Biventricular cardiac pacemaker in situ 04/16/2020  ? Adjustment disorder 04/03/2020  ? Mild cognitive impairment 04/03/2020  ? S/P arthroscopy of right shoulder 02/09/2019  ? Rotator cuff tear arthropathy of right shoulder 01/14/2019  ? Impingement syndrome of right shoulder 01/14/2019  ? VT (ventricular tachycardia) ([place]) 11/12/2018  ? Environmental and seasonal allergies 06/09/2018  ? Senile solar keratosis 05/26/2018  ? Contact dermatitis 05/26/2018  ? Eczema of eyelid 05/26/2018  ? Inguinal hernia of right side without obstruction or gangrene 05/26/2018  ? CAD (coronary artery disease), native coronary artery 01/30/2018  ? Coronary artery aneurysm 01/30/2018  ? Other male erectile dysfunction- "hx of fractured penis requiring sx" 05/20/2017  ? Hyperlipidemia LDL goal <100 05/13/2017  ? Dizziness 08/21/2016  ? BBBB (bilateral bundle branch block) 10/18/2015  ? Complete heart block ([place]) 10/18/2015  ? Vitamin B12 deficiency 10/06/2015  ? Vitamin D insufficiency 10/06/2015  ? Bradycardia 10/06/2015  ? Hereditary hyperbilirubinemia 10/06/2015  ? H/O noncompliance with medical treatment, presenting hazards to health 09/19/2015  ? Peyronie's disease 09/19/2015  ? DOE (dyspnea  on exertion) 09/19/2015  ? Unexplained weight loss 09/14/2015  ? Essential hypertension 09/14/2015  ? h/o Non-compliance: Has not seen a doc in over 13 years 09/14/2015  ? Fatigue 09/14/2015  ? History of nonadherence to medical treatment 09/14/2015  ? ?Past Medical History:  ?Diagnosis Date  ? AICD (automatic cardioverter/defibrillator) present   ? Arthritis THUMBS  ? LBBB (left bundle branch block)   ? bilat. BBB- PPM  ? OSA (obstructive sleep apnea) 05/17/2021  ? Peyronie's disease   ? Presence of permanent cardiac pacemaker   ? ?Past Surgical History:  ?Procedure Laterality Date  ? CARDIOVASCULAR STRESS TEST  10-18-2002  DR  CRENSHAW  ? NORMAL ADENOSINE CARDIOLITE/ EF 60%  ? CARDIOVERSION N/A 08/18/2020  ? Procedure: CARDIOVERSION;  Surgeon: Geralynn Rile, MD;  Location: [facility];  Service: Cardiovascular;  Laterality: N/A;  ? CARDIOVERSION N/A 11/15/2020  ? Procedure: CARDIOVERSION;  Surgeon: Sueanne Margarita, MD;  Location: [facility] ENDOSCOPY;  Service: Cardiovascular;  Laterality: N/A;  ? CATARACT EXTRACTION W/ INTRAOCULAR LENS IMPLANT  2006  ? LEFT EYE  ? EP IMPLANTABLE DEVICE N/A 10/18/2015  ? Procedure: BiV Pacemaker Insertion CRT-P;  Surgeon: [name], MD;  Location: [facility];  Service: Cardiovascular;  Laterality: N/A;  ? INGUINAL HERNIA REPAIR Right 02/23/2019  ? Procedure: RIGHT INGUINAL HERNIA REPAIR WITH MESH;  Surgeon: Erroll Luna, MD;  Location: [place] [facility];  Service: General;  Laterality: Right;  ? LAPAROSCOPIC CHOLECYSTECTOMY  10-09-2007  ? LEFT HEART CATH AND CORONARY ANGIOGRAPHY N/A 01/30/2018  ? Procedure: LEFT HEART CATH AND CORONARY ANGIOGRAPHY;  Surgeon: Belva Crome, MD;  Location: [facility];  Service: Cardiovascular;  Laterality: N/A;  ? NESBIT PROCEDURE  01/17/2012  ? Procedure: NESBIT PROCEDURE;  Surgeon: [name], MD;  Location: [facility];  Service: Urology;  Laterality: N/A;  16 dot plication   ? [place],  LEFT EYE  1993  ? REPAIR LEFT INGUINAL HERNIA  2005  ? ?Social History  ? ?Tobacco Use  ? Smoking status: Never  ? Smokeless tobacco: Never  ?Vaping Use  ? Vaping Use: Never used  ?Substance Use Topics  ? Alcohol use: No  ? Drug use: No  ? ?No Known Allergies ? ?ROS ?Review of Systems:  ?A fourteen system review of systems was performed and found to be positive as per HPI. ? ?  ?Objective:  ?  ? ?BP (!) 93/54   Pulse 85   Temp 97.7 ?F (36.5 ?C)   Ht 5' 8"  (1.727 m)   Wt 170 lb (77.1 kg)   SpO2 100%   BMI 25.85 kg/m?  ?BP Readings from Last 3 Encounters:  ?07/16/21 (!) 93/54  ?05/03/21 130/83  ?04/26/21 120/62  ? ?Wt Readings from Last 3 Encounters:  ?07/16/21 170 lb (77.1 kg)  ?05/17/21 163 lb (73.9 kg)  ?05/03/21 166 lb (75.3 kg)  ? ? ?Physical Exam ?General:  Pleasant and cooperative,  appropriate for stated age.  ?Neuro:  Alert and oriented,  extra-ocular muscles intact  ?HEENT:  Normocephalic, atraumatic, neck supple  ?Skin:  no gross rash, warm, pink. ?Cardiac:  RRR, S1 S2 ?Respiratory: CTA B/L  ?Vascular:  Ext warm, no cyanosis apprec.; cap RF less 2 sec. ?Psych:  No HI/SI, judgement and insight good, Euthymic mood. Full Affect. ? ? ?No results found for any visits on 07/16/21. ? ?Last CBC ?Lab Results  ?Component Value Date  ? WBC 6.0 04/26/2021  ? HGB 13.8 04/26/2021  ? HCT 40.1 04/26/2021  ? MCV 91 04/26/2021  ? MCH 31.4 04/26/2021  ? RDW 12.2 04/26/2021  ? PLT 182 04/26/2021  ? ?Last metabolic panel ?Lab Results  ?Component Value Date  ? GLUCOSE 102 (H) 05/03/2021  ? NA 138 05/03/2021  ? K 4.9 05/03/2021  ? CL 101 05/03/2021  ? CO2 26 05/03/2021  ? BUN 14 05/03/2021  ? CREATININE 0.98 05/03/2021  ? EGFR 84 05/03/2021  ? CALCIUM 10.2 05/03/2021  ? PROT 7.5 05/03/2021  ? ALBUMIN 4.5 05/03/2021  ? LABGLOB 3.0 05/03/2021  ? AGRATIO 1.5 05/03/2021  ? BILITOT 1.6 (H) 05/03/2021  ? ALKPHOS 89 05/03/2021  ? AST 18 05/03/2021  ? ALT 16 05/03/2021  ? ANIONGAP 6 12/22/2020  ? ?Last lipids ?Lab Results   ?Component Value Date  ? CHOL 224 (H) 05/03/2021  ? HDL 56 05/03/2021  ? LDLCALC 144 (H) 05/03/2021  ? TRIG 134 05/03/2021  ? CHOLHDL 4.0 05/03/2021  ? ?Last hemoglobin A1c ?Lab Results  ?Component Value Date  ? HGBA1C 5.6 05/03/2021  ? ?Last thyroid functions ?Lab Results  ?Component Value Date  ? TSH 1.010 12/18/2020  ? ?Last vitamin D ?Lab Results  ?Component Value Date  ? VD25OH 59.6 09/24/2018  ? ? ?The 10-year ASCVD risk score (Arnett DK, et al., 2019) is: 10.8% ? ?  ?Assessment & Plan:  ? ?Problem List Items Addressed This Visit   ? ?  ? Cardiovascular and Mediastinum  ? Essential hypertension (Chronic)  ?  ? Respiratory  ? OSA (obstructive sleep apnea) - Primary  ? ?OSA: ?-Discussed to continue CPAP compliance. ?-Sleep  study 05/17/2021: IMPRESSIONS ?- An optimal PAP pressure could not be selected for this patient based on the available study data. Initial CPAP titration and mask change were not well tolerated and  changed to BIPAP titration. Tech reported this was also associated by the patient with severe anxiety and complaint of "chest fullness". Patient asked to end study at 01:52 due to anxiety. ?- Mild Central Sleep Apnea was noted during this titration (CAI = 8.7/h). ?- Moderate oxygen desaturations were observed during this titration (min O2 = 81.0%). ?- No snoring was audible during this study. ?- 2-lead EKG demonstrated: PVCs ?- Clinically significant periodic limb movements were not noted during this study. Arousals associated with PLMs were rare. ?  ?DIAGNOSIS ?- Obstructive Sleep Apnea (G47.33) ?  ?RECOMMENDATIONS ?- Recommend a trial of Auto-BiPAP 6 - 18 cm H2O.  ?- Patient may benefit from daytime return to Sleep Center for "CPAP mask desensitization with PAP Nap". ?- Sleep Medicine consultation for treatment option assessment is available if helpful.. ?- Sleep hygiene should be reviewed to assess factors that may improve sleep quality. ?- Weight management and regular exercise should be  initiated or continued. ?  ?Baird Lyons MD, ABSM ?Diplomate, Tax adviser of Sleep Medicine ?  ?Essential hypertension: ?-Soft blood pressure today. Pt denies feeling dizzy or having other symptoms. Will continue to

## 2021-07-16 ENCOUNTER — Ambulatory Visit (INDEPENDENT_AMBULATORY_CARE_PROVIDER_SITE_OTHER): Payer: Medicare HMO | Admitting: Physician Assistant

## 2021-07-16 ENCOUNTER — Encounter: Payer: Self-pay | Admitting: Physician Assistant

## 2021-07-16 VITALS — BP 93/54 | HR 85 | Temp 97.7°F | Ht 68.0 in | Wt 170.0 lb

## 2021-07-16 DIAGNOSIS — I10 Essential (primary) hypertension: Secondary | ICD-10-CM | POA: Diagnosis not present

## 2021-07-16 DIAGNOSIS — G4733 Obstructive sleep apnea (adult) (pediatric): Secondary | ICD-10-CM | POA: Diagnosis not present

## 2021-07-25 ENCOUNTER — Ambulatory Visit (INDEPENDENT_AMBULATORY_CARE_PROVIDER_SITE_OTHER): Payer: Medicare HMO

## 2021-07-25 DIAGNOSIS — I442 Atrioventricular block, complete: Secondary | ICD-10-CM | POA: Diagnosis not present

## 2021-07-25 LAB — CUP PACEART REMOTE DEVICE CHECK
Battery Remaining Longevity: 41 mo
Battery Voltage: 2.93 V
Brady Statistic AP VP Percent: 29.98 %
Brady Statistic AP VS Percent: 0.1 %
Brady Statistic AS VP Percent: 68.89 %
Brady Statistic AS VS Percent: 1.03 %
Brady Statistic RA Percent Paced: 30.62 %
Brady Statistic RV Percent Paced: 98.87 %
Date Time Interrogation Session: 20230509233901
Implantable Lead Implant Date: 20170802
Implantable Lead Implant Date: 20170802
Implantable Lead Implant Date: 20170802
Implantable Lead Location: 753858
Implantable Lead Location: 753859
Implantable Lead Location: 753860
Implantable Lead Model: 5076
Implantable Lead Model: 5076
Implantable Pulse Generator Implant Date: 20170802
Lead Channel Impedance Value: 1064 Ohm
Lead Channel Impedance Value: 1064 Ohm
Lead Channel Impedance Value: 1102 Ohm
Lead Channel Impedance Value: 323 Ohm
Lead Channel Impedance Value: 399 Ohm
Lead Channel Impedance Value: 456 Ohm
Lead Channel Impedance Value: 494 Ohm
Lead Channel Impedance Value: 494 Ohm
Lead Channel Impedance Value: 532 Ohm
Lead Channel Impedance Value: 551 Ohm
Lead Channel Impedance Value: 684 Ohm
Lead Channel Impedance Value: 836 Ohm
Lead Channel Impedance Value: 893 Ohm
Lead Channel Impedance Value: 950 Ohm
Lead Channel Pacing Threshold Amplitude: 0.625 V
Lead Channel Pacing Threshold Amplitude: 1 V
Lead Channel Pacing Threshold Amplitude: 1.5 V
Lead Channel Pacing Threshold Pulse Width: 0.4 ms
Lead Channel Pacing Threshold Pulse Width: 0.4 ms
Lead Channel Pacing Threshold Pulse Width: 0.8 ms
Lead Channel Sensing Intrinsic Amplitude: 1.625 mV
Lead Channel Sensing Intrinsic Amplitude: 1.625 mV
Lead Channel Sensing Intrinsic Amplitude: 15.25 mV
Lead Channel Sensing Intrinsic Amplitude: 15.25 mV
Lead Channel Setting Pacing Amplitude: 1.5 V
Lead Channel Setting Pacing Amplitude: 2 V
Lead Channel Setting Pacing Amplitude: 2.5 V
Lead Channel Setting Pacing Pulse Width: 0.4 ms
Lead Channel Setting Pacing Pulse Width: 0.8 ms
Lead Channel Setting Sensing Sensitivity: 2.8 mV

## 2021-07-25 NOTE — Progress Notes (Signed)
? ?Cardiology Office Note ?Date:  07/30/2021  ?Patient ID:  George, [name] George [name], George [name], MRN [id number] ?PCP:  Lorrene Reid, PA-C  ?Cardiologist:  Dr. Radford Pax ?Electrophysiologist: Dr. Caryl Comes ? ?  ?Chief Complaint:  f/u ? ?History of Present Illness: ?George [name] is a 71 y.o. male with history of HTN, , 2nd degree AVblock,II, s/p CRT-P, ATach, NSVT, AFib, NICM ? ? ?I saw him 01/15/21 for his 19mopost Tikosyn visit ?He is accompanied by his wife today. ?He has not felt like he has had any Afib. ?He reports 4-5 months of generally not feeling great though. ?His legs/thighs, calves seem weak b/l and equally, and doesn't feel like he has the same stamina ?Walking makes his calves hurt b/l ?His wife says that she feels like his thinking has slowed. ?He saw his PMD service yesterday but did not discuss these symptoms, his wife was not with him. ?No CP, no overt SOB, but says in the last couple months, especially the last month with the AFib he has not been doing much and now getting into his busy season, thinks he has deconditioned ?No near syncope or syncope. ?No bleeding or signs of bleeding ? ?Unclear etiology of his fatigue, seeming to be slowing? ?LVEF had recovered, was maintaining SR ?Planned for LE arterial studies, a trial off his statin  ? ?LE studies are without evidence of significant vascular disease ? ? ?I saw him 04/26/21 ?He thinks his LE do feel a little better off the statin, but generally still feels weak. ?He denies CP, palpitations, he is not SOB ?Says he just does not have energy. ?His wife reports this as a slow steady decline over the last 3+ years ?He denies his exertional capacity as being an overt physical symtom, but is just to tored to keep up. ?He has been found to have sleep apnea, but the titration study has been held up it seems at the sleep clinic and/or insurance. ?No near syncope or syncope ?He thinks he may have had some Afib a week or 2 ago was working in [place]  felt particularly fatigued abut 15 minutes then better. ?No CP, SOB, DOE ?No bleeding or signs of bleeding ?He mentions K+ is on his list of medicines, but does not have any/not taking it ? ?Not found to be volume OL ?LE weakness better not resolved off statin, LE studies without significant vascular disease ?Was pending sleep study/titration, and advised to follow up with his PMD for some progressive fatigue, and LE complaints ? ?Sleep titration study looks like it was suboptimal, poorly tolerated by the pt, ultimately able to use auto titrate CPAP, saw PMD 07/16/21 with improving tolerance and compliance. ? ?TODAY ?He is accompanied by his wife. ?Tolerating CPAP better and better and subsequently is feeling better ?Still has some times or days that he feels a little "blah" but overall better. ?No CP, palpitations ?He accidentally took an extra [place] few days ago, this is a 1st time ever.  ?Otherwise very compliant with all of his meds. ?NO near syncope or syncope. ?No SOB ?[place] ?No bleeding or signs of bleeding ? ? ?Device information:  ?MDT CRT-P, implanted 10/18/15, Dr. KCaryl Comes? ?AFib Hx ?Found via device, May 2022 ? ?AAD ?Tikosyn, started 11/2020 ? ? ?Past Medical History:  ?Diagnosis Date  ? AICD (automatic cardioverter/defibrillator) present   ? Arthritis THUMBS  ? LBBB (left bundle branch block)   ?  bilat. BBB- PPM  ? OSA (obstructive sleep apnea) 05/17/2021  ? Peyronie's disease   ? Presence of permanent cardiac pacemaker   ? ? ?Past Surgical History:  ?Procedure Laterality Date  ? CARDIOVASCULAR STRESS TEST  10-18-2002  DR  CRENSHAW  ? NORMAL ADENOSINE CARDIOLITE/ EF 60%  ? CARDIOVERSION N/A 08/18/2020  ? Procedure: CARDIOVERSION;  Surgeon: Geralynn Rile, MD;  Location: [facility];  Service: Cardiovascular;  Laterality: N/A;  ? CARDIOVERSION N/A 11/15/2020  ? Procedure: CARDIOVERSION;  Surgeon: Sueanne Margarita, MD;  Location: [facility] ENDOSCOPY;   Service: Cardiovascular;  Laterality: N/A;  ? CATARACT EXTRACTION W/ INTRAOCULAR LENS IMPLANT  2006  ? LEFT EYE  ? EP IMPLANTABLE DEVICE N/A 10/18/2015  ? Procedure: BiV Pacemaker Insertion CRT-P;  Surgeon: [name], MD;  Location: [facility] CV LAB;  Service: Cardiovascular;  Laterality: N/A;  ? INGUINAL HERNIA REPAIR Right 02/23/2019  ? Procedure: RIGHT INGUINAL HERNIA REPAIR WITH MESH;  Surgeon: Erroll Luna, MD;  Location: [facility];  Service: General;  Laterality: Right;  ? LAPAROSCOPIC CHOLECYSTECTOMY  10-09-2007  ? LEFT HEART CATH AND CORONARY ANGIOGRAPHY N/A 01/30/2018  ? Procedure: LEFT HEART CATH AND CORONARY ANGIOGRAPHY;  Surgeon: Belva Crome, MD;  Location: [facility] CV LAB;  Service: Cardiovascular;  Laterality: N/A;  ? NESBIT PROCEDURE  01/17/2012  ? Procedure: NESBIT PROCEDURE;  Surgeon: [name], MD;  Location: [facility];  Service: Urology;  Laterality: N/A;  16 dot plication   ? Laura, LEFT EYE  1993  ? REPAIR LEFT INGUINAL HERNIA  2005  ? ? ?Current Outpatient Medications  ?Medication Sig Dispense Refill  ? apixaban (ELIQUIS) 5 MG TABS tablet Take 1 tablet (5 mg total) by mouth 2 (two) times daily. 60 tablet 6  ? carvedilol (COREG) 25 MG tablet Take 1 tablet (25 mg total) by mouth 2 (two) times daily with a meal. 180 tablet 2  ? Cholecalciferol (VITAMIN D3) 5000 units TABS 5,000 IU OTC vitamin D3 daily. 90 tablet 3  ? clotrimazole-betamethasone (LOTRISONE) cream Apply 1 application topically as needed (for skin).    ? ezetimibe (ZETIA) 10 MG tablet TAKE 1 TABLET BY MOUTH EVERY DAY 30 tablet 1  ? fexofenadine (ALLEGRA) 180 MG tablet TAKE 1 TABLET (180 MG TOTAL) BY MOUTH DAILY. DURING ALLERGY SEASON 90 tablet 1  ? fluticasone (FLONASE) 50 MCG/ACT nasal spray USE 1 SPRAY IN EACH NOSTRIL AFTER SINUS RINSES TWICE DAILY. 48 mL 1  ? furosemide (LASIX) 20 MG tablet Take 20 mg by mouth daily.    ? hydrocortisone (ANUSOL-HC) 2.5 % rectal  cream Place 1 application rectally 2 (two) times daily. 30 g 0  ? losartan (COZAAR) 25 MG tablet Take 0.5 tablets (12.5 mg total) by mouth daily. 45 tablet 3  ? Multiple Vitamins-Minerals (CENTRUM ADULTS) TABS Take 1 tablet by mouth daily.    ? spironolactone (ALDACTONE) 25 MG tablet Take 1 tablet (25 mg total) by mouth daily. 90 tablet 3  ? tamsulosin (FLOMAX) 0.4 MG CAPS capsule Take 0.4 mg by mouth daily.    ? dofetilide (TIKOSYN) 500 MCG capsule Take 1 capsule (500 mcg total) by mouth 2 (two) times daily. 180 capsule 2  ? ?No current facility-administered medications for this visit.  ? ? ?Allergies:   Patient has no known allergies.  ? ?Social History:  The patient  reports that he has never smoked. He has never used smokeless tobacco. He reports that he does not drink  alcohol and does not use drugs.  ? ?Family History:  The patient's family history includes Arrhythmia in his father; Diabetes in his mother; Healthy in his daughter and son; Heart attack in his paternal grandfather; Heart disease in his father and paternal grandfather; Hyperlipidemia in his father and mother; Hypertension in his father and mother. ? ?ROS:  Please see the history of present illness.  ?All other systems are reviewed and otherwise negative.  ? ?PHYSICAL EXAM:  ?VS:  BP 122/84   Ht '5\' 8"'$  (1.727 m)   Wt 169 lb (76.7 kg)   SpO2 98%   BMI 25.70 kg/m?  BMI: Body mass index is 25.7 kg/m?. ?Well nourished, well developed, in no acute distress  ?HEENT: normocephalic, atraumatic  ?Neck: no JVD, carotid bruits or masses ?Cardiac:  RRR; no significant murmurs, no rubs, or gallops ?Lungs:   CTA b/l, no wheezing, rhonchi or rales  ?Abd: soft, nontender ?MS: no deformity or arophy ?Ext:   no edema  ?Skin: warm and dry, no rash ?Neuro:  No gross deficits appreciated ?Psych: euthymic mood, full affect ? ?PPM site is stable, no tethering or discomfort ? ? ?EKG:  done today and reviewed by myself: ?SB/V paced 58, QRS is 146/QTc given QRS duration  is good, stable from priors ? ?Device interrogation done today and reviewed by myself:   ?Battery and lead measurements are good ?One MMNSVT back in Feb, 9 beats ?No AFib ?98.7 %effective BiVE pacing ? ? ?01/26/21

## 2021-07-30 ENCOUNTER — Encounter: Payer: Self-pay | Admitting: Physician Assistant

## 2021-07-30 ENCOUNTER — Ambulatory Visit: Payer: Medicare HMO | Admitting: Physician Assistant

## 2021-07-30 ENCOUNTER — Encounter: Payer: Medicare HMO | Admitting: Internal Medicine

## 2021-07-30 VITALS — BP 122/84 | Ht 68.0 in | Wt 169.0 lb

## 2021-07-30 DIAGNOSIS — Z950 Presence of cardiac pacemaker: Secondary | ICD-10-CM

## 2021-07-30 DIAGNOSIS — I442 Atrioventricular block, complete: Secondary | ICD-10-CM

## 2021-07-30 DIAGNOSIS — Z5181 Encounter for therapeutic drug level monitoring: Secondary | ICD-10-CM

## 2021-07-30 DIAGNOSIS — Z79899 Other long term (current) drug therapy: Secondary | ICD-10-CM

## 2021-07-30 DIAGNOSIS — I428 Other cardiomyopathies: Secondary | ICD-10-CM | POA: Diagnosis not present

## 2021-07-30 DIAGNOSIS — I480 Paroxysmal atrial fibrillation: Secondary | ICD-10-CM | POA: Diagnosis not present

## 2021-07-30 LAB — CUP PACEART INCLINIC DEVICE CHECK
Battery Remaining Longevity: 41 mo
Battery Voltage: 2.93 V
Brady Statistic AP VP Percent: 30.24 %
Brady Statistic AP VS Percent: 0.1 %
Brady Statistic AS VP Percent: 68.64 %
Brady Statistic AS VS Percent: 1.03 %
Brady Statistic RA Percent Paced: 30.87 %
Brady Statistic RV Percent Paced: 98.87 %
Date Time Interrogation Session: 20230515132952
Implantable Lead Implant Date: 20170802
Implantable Lead Implant Date: 20170802
Implantable Lead Implant Date: 20170802
Implantable Lead Location: 753858
Implantable Lead Location: 753859
Implantable Lead Location: 753860
Implantable Lead Model: 5076
Implantable Lead Model: 5076
Implantable Pulse Generator Implant Date: 20170802
Lead Channel Impedance Value: 1045 Ohm
Lead Channel Impedance Value: 1045 Ohm
Lead Channel Impedance Value: 1083 Ohm
Lead Channel Impedance Value: 361 Ohm
Lead Channel Impedance Value: 437 Ohm
Lead Channel Impedance Value: 494 Ohm
Lead Channel Impedance Value: 513 Ohm
Lead Channel Impedance Value: 532 Ohm
Lead Channel Impedance Value: 532 Ohm
Lead Channel Impedance Value: 532 Ohm
Lead Channel Impedance Value: 684 Ohm
Lead Channel Impedance Value: 817 Ohm
Lead Channel Impedance Value: 855 Ohm
Lead Channel Impedance Value: 912 Ohm
Lead Channel Pacing Threshold Amplitude: 0.625 V
Lead Channel Pacing Threshold Amplitude: 0.875 V
Lead Channel Pacing Threshold Amplitude: 1.375 V
Lead Channel Pacing Threshold Pulse Width: 0.4 ms
Lead Channel Pacing Threshold Pulse Width: 0.4 ms
Lead Channel Pacing Threshold Pulse Width: 0.8 ms
Lead Channel Sensing Intrinsic Amplitude: 1.75 mV
Lead Channel Sensing Intrinsic Amplitude: 15.25 mV
Lead Channel Sensing Intrinsic Amplitude: 15.25 mV
Lead Channel Sensing Intrinsic Amplitude: 2 mV
Lead Channel Setting Pacing Amplitude: 1.5 V
Lead Channel Setting Pacing Amplitude: 2 V
Lead Channel Setting Pacing Amplitude: 2.5 V
Lead Channel Setting Pacing Pulse Width: 0.4 ms
Lead Channel Setting Pacing Pulse Width: 0.8 ms
Lead Channel Setting Sensing Sensitivity: 2.8 mV

## 2021-07-30 LAB — BASIC METABOLIC PANEL
BUN/Creatinine Ratio: 15 (ref 10–24)
BUN: 12 mg/dL (ref 8–27)
CO2: 28 mmol/L (ref 20–29)
Calcium: 9.8 mg/dL (ref 8.6–10.2)
Chloride: 102 mmol/L (ref 96–106)
Creatinine, Ser: 0.81 mg/dL (ref 0.76–1.27)
Glucose: 100 mg/dL — ABNORMAL HIGH (ref 70–99)
Potassium: 3.9 mmol/L (ref 3.5–5.2)
Sodium: 137 mmol/L (ref 134–144)
eGFR: 96 mL/min/{1.73_m2} (ref >59)

## 2021-07-30 LAB — MAGNESIUM: Magnesium: 2.1 mg/dL (ref 1.6–2.3)

## 2021-07-30 MED ORDER — DOFETILIDE 500 MCG PO CAPS: 500.0000 ug | ORAL_CAPSULE | Freq: Two times a day (BID) | ORAL | 2 refills | Status: DC

## 2021-07-30 NOTE — Patient Instructions (Addendum)
Medication Instructions:  ? ?Your physician recommends that you continue on your current medications as directed. Please refer to the Current Medication list given to you today.\ ? ?*If you need a refill on your cardiac medications before your next appointment, please call your pharmacy* ? ? ?Lab Work: [facility] AND [place]  ? ? ?If you have labs (blood work) drawn today and your tests are completely normal, you will receive your results only by: ?MyChart Message (if you have MyChart) OR ?A paper copy in the mail ?If you have any lab test that is abnormal or we need to change your treatment, we will call you to review the results. ? ? ?Testing/Procedures: NONE ORDERED  TODAY ? ? ? ?Follow-Up: ?At [facility], you and your health needs are our priority.  As part of our continuing mission to provide you with exceptional heart care, we have created designated Provider Care Teams.  These Care Teams include your primary Cardiologist (physician) and Advanced Practice Providers (APPs -  Physician Assistants and Nurse Practitioners) who all work together to provide you with the care you need, when you need it. ? ?We recommend signing up for the patient portal called "MyChart".  Sign up information is provided on this After Visit Summary.  MyChart is used to connect with patients for Virtual Visits (Telemedicine).  Patients are able to view lab/test results, encounter notes, upcoming appointments, etc.  Non-urgent messages can be sent to your provider as well.   ?To learn more about what you can do with MyChart, go to [email or web address].   ? ?Your next appointment:   ?6 month(s) ? ?The format for your next appointment:   ?In Person ? ?Provider:   ?You may see Virl Axe, MD or one of the following Advanced Practice Providers on your designated Care Team:   ?Tommye Standard, PA-C ?Legrand Como "Jonni Sanger" [place], PA-C  ? ? ?Other Instructions ? ?Important Information About [place] ? ? ? ? ?  ?

## 2021-08-02 NOTE — Progress Notes (Signed)
Remote pacemaker transmission.   

## 2021-08-03 ENCOUNTER — Other Ambulatory Visit: Payer: Self-pay | Admitting: Physician Assistant

## 2021-08-03 DIAGNOSIS — E785 Hyperlipidemia, unspecified: Secondary | ICD-10-CM

## 2021-08-12 DIAGNOSIS — G4733 Obstructive sleep apnea (adult) (pediatric): Secondary | ICD-10-CM | POA: Diagnosis not present

## 2021-08-28 ENCOUNTER — Other Ambulatory Visit: Payer: Self-pay | Admitting: Student

## 2021-08-31 ENCOUNTER — Other Ambulatory Visit: Payer: Self-pay | Admitting: Physician Assistant

## 2021-08-31 DIAGNOSIS — E785 Hyperlipidemia, unspecified: Secondary | ICD-10-CM

## 2021-09-12 DIAGNOSIS — G4733 Obstructive sleep apnea (adult) (pediatric): Secondary | ICD-10-CM | POA: Diagnosis not present

## 2021-09-15 DIAGNOSIS — I428 Other cardiomyopathies: Secondary | ICD-10-CM

## 2021-09-15 DIAGNOSIS — Z79899 Other long term (current) drug therapy: Secondary | ICD-10-CM

## 2021-09-19 NOTE — Telephone Encounter (Signed)
-----   Message from Werner Lean, MD sent at 09/17/2021  5:27 PM EDT ----- Lets double up his lasix for this week and have him repeat a BMP and BNP for Monday; based on this we can see if he needs more aggressive therapy. ----- Message ----- From: Ma Hillock Sent: 09/17/2021   1:15 PM EDT To: Sueanne Margarita, MD  Sending this to you bc chart shows turner is his primary and Joseph Art is out all week. Sounds like he does need to increase his lasix ----- Message ----- From: Wanda Plump, RN Sent: 09/17/2021  11:41 AM EDT To: Donnalee Curry K  His fluid status is trending up

## 2021-09-19 NOTE — Telephone Encounter (Signed)
Called and spoke with patient. He understands that he is to double his Furosemide (total daily dose of '40mg'$ ) for the next 6 days and will come to office next Wednesday (12th) for labs. Message didn't get to patient until 7/5 due to holiday, so requested labs pushed out 2 days since pt didn't have message.

## 2021-09-26 ENCOUNTER — Other Ambulatory Visit: Payer: Medicare HMO

## 2021-09-26 DIAGNOSIS — I428 Other cardiomyopathies: Secondary | ICD-10-CM

## 2021-09-26 DIAGNOSIS — Z79899 Other long term (current) drug therapy: Secondary | ICD-10-CM

## 2021-09-27 LAB — BASIC METABOLIC PANEL
BUN/Creatinine Ratio: 16 (ref 10–24)
BUN: 16 mg/dL (ref 8–27)
CO2: 23 mmol/L (ref 20–29)
Calcium: 10.5 mg/dL — ABNORMAL HIGH (ref 8.6–10.2)
Chloride: 100 mmol/L (ref 96–106)
Creatinine, Ser: 0.97 mg/dL (ref 0.76–1.27)
Glucose: 128 mg/dL — ABNORMAL HIGH (ref 70–99)
Potassium: 4.7 mmol/L (ref 3.5–5.2)
Sodium: 139 mmol/L (ref 134–144)
eGFR: 85 mL/min/{1.73_m2} (ref >59)

## 2021-09-27 LAB — PRO B NATRIURETIC PEPTIDE: NT-Pro BNP: 108 pg/mL (ref 0–376)

## 2021-09-30 ENCOUNTER — Other Ambulatory Visit: Payer: Self-pay | Admitting: Physician Assistant

## 2021-09-30 DIAGNOSIS — E785 Hyperlipidemia, unspecified: Secondary | ICD-10-CM

## 2021-10-12 DIAGNOSIS — G4733 Obstructive sleep apnea (adult) (pediatric): Secondary | ICD-10-CM | POA: Diagnosis not present

## 2021-10-16 ENCOUNTER — Ambulatory Visit: Payer: Medicare HMO | Admitting: Physician Assistant

## 2021-10-24 ENCOUNTER — Ambulatory Visit (INDEPENDENT_AMBULATORY_CARE_PROVIDER_SITE_OTHER): Payer: Medicare HMO

## 2021-10-24 DIAGNOSIS — I428 Other cardiomyopathies: Secondary | ICD-10-CM

## 2021-10-24 LAB — CUP PACEART REMOTE DEVICE CHECK
Battery Remaining Longevity: 39 mo
Battery Voltage: 2.93 V
Brady Statistic AP VP Percent: 34.44 %
Brady Statistic AP VS Percent: 0.12 %
Brady Statistic AS VP Percent: 64.27 %
Brady Statistic AS VS Percent: 1.17 %
Brady Statistic RA Percent Paced: 35.2 %
Brady Statistic RV Percent Paced: 98.7 %
Date Time Interrogation Session: 20230808202019
Implantable Lead Implant Date: 20170802
Implantable Lead Implant Date: 20170802
Implantable Lead Implant Date: 20170802
Implantable Lead Location: 753858
Implantable Lead Location: 753859
Implantable Lead Location: 753860
Implantable Lead Model: 5076
Implantable Lead Model: 5076
Implantable Pulse Generator Implant Date: 20170802
Lead Channel Impedance Value: 1045 Ohm
Lead Channel Impedance Value: 1064 Ohm
Lead Channel Impedance Value: 1121 Ohm
Lead Channel Impedance Value: 342 Ohm
Lead Channel Impedance Value: 418 Ohm
Lead Channel Impedance Value: 475 Ohm
Lead Channel Impedance Value: 475 Ohm
Lead Channel Impedance Value: 494 Ohm
Lead Channel Impedance Value: 551 Ohm
Lead Channel Impedance Value: 551 Ohm
Lead Channel Impedance Value: 703 Ohm
Lead Channel Impedance Value: 836 Ohm
Lead Channel Impedance Value: 874 Ohm
Lead Channel Impedance Value: 950 Ohm
Lead Channel Pacing Threshold Amplitude: 0.625 V
Lead Channel Pacing Threshold Amplitude: 1.125 V
Lead Channel Pacing Threshold Amplitude: 1.375 V
Lead Channel Pacing Threshold Pulse Width: 0.4 ms
Lead Channel Pacing Threshold Pulse Width: 0.4 ms
Lead Channel Pacing Threshold Pulse Width: 0.8 ms
Lead Channel Sensing Intrinsic Amplitude: 1.375 mV
Lead Channel Sensing Intrinsic Amplitude: 1.375 mV
Lead Channel Sensing Intrinsic Amplitude: 15.25 mV
Lead Channel Sensing Intrinsic Amplitude: 15.25 mV
Lead Channel Setting Pacing Amplitude: 1.5 V
Lead Channel Setting Pacing Amplitude: 2 V
Lead Channel Setting Pacing Amplitude: 2.5 V
Lead Channel Setting Pacing Pulse Width: 0.4 ms
Lead Channel Setting Pacing Pulse Width: 0.8 ms
Lead Channel Setting Sensing Sensitivity: 2.8 mV

## 2021-10-26 ENCOUNTER — Other Ambulatory Visit: Payer: Self-pay | Admitting: Physician Assistant

## 2021-10-26 DIAGNOSIS — E785 Hyperlipidemia, unspecified: Secondary | ICD-10-CM

## 2021-11-06 ENCOUNTER — Ambulatory Visit: Payer: Self-pay

## 2021-11-06 NOTE — Patient Instructions (Signed)
Visit Information  Thank you for allowing me to share the  care coordination services that are available to you as part of your health plan and services through your primary care provider. Please reach out to your Office if the care coordination team may be of assistance to you in the future.   Alik Mawson RN, BSN, CPC Care Management Coordinator [facility]   Phone: [phone]     

## 2021-11-06 NOTE — Patient Outreach (Signed)
  Care Coordination   Initial Visit Note   11/06/2021 Name: George [name] MRN: [id number] DOB: May 26, 1950  George [name] is a 71 y.o. year old male who sees Lorrene Reid, [place] for primary care. I spoke with  Soledad Gerlach by phone today  What matters to the patients health and wellness today?  The patient hadn o needs or concerns.    Goals Addressed               This Visit's Progress     COMPLETED: Care Coordinattion Activites -no follow up required (pt-stated)          SDOH assessments and interventions completed:  No     Care Coordination Interventions Activated:  No  Care Coordination Interventions:  No, not indicated   Follow up plan: No further intervention required.   Encounter Outcome:  Pt. Refused   Lazaro Arms RN, BSN, [facility]   Phone: [phone]

## 2021-11-08 ENCOUNTER — Ambulatory Visit (INDEPENDENT_AMBULATORY_CARE_PROVIDER_SITE_OTHER): Payer: Medicare HMO | Admitting: Physician Assistant

## 2021-11-08 ENCOUNTER — Encounter: Payer: Self-pay | Admitting: Physician Assistant

## 2021-11-08 VITALS — BP 119/75 | HR 82 | Temp 97.7°F | Ht 68.5 in | Wt 168.0 lb

## 2021-11-08 DIAGNOSIS — E785 Hyperlipidemia, unspecified: Secondary | ICD-10-CM

## 2021-11-08 DIAGNOSIS — H6123 Impacted cerumen, bilateral: Secondary | ICD-10-CM

## 2021-11-08 DIAGNOSIS — G4733 Obstructive sleep apnea (adult) (pediatric): Secondary | ICD-10-CM | POA: Diagnosis not present

## 2021-11-08 DIAGNOSIS — R202 Paresthesia of skin: Secondary | ICD-10-CM

## 2021-11-08 DIAGNOSIS — I10 Essential (primary) hypertension: Secondary | ICD-10-CM | POA: Diagnosis not present

## 2021-11-08 NOTE — Assessment & Plan Note (Signed)
-  Recommend to continue compliance with CPAP machine.

## 2021-11-08 NOTE — Patient Instructions (Addendum)
Peripheral Neuropathy Peripheral neuropathy is a type of nerve damage. It affects nerves that carry signals between the spinal cord and the arms, legs, and the rest of the body (peripheral nerves). It does not affect nerves in the spinal cord or brain. In peripheral neuropathy, one nerve or a group of nerves may be damaged. Peripheral neuropathy is a broad category that includes many specific nerve disorders, like diabetic neuropathy, hereditary neuropathy, and carpal tunnel syndrome. What are the causes? This condition may be caused by: Certain diseases, such as: Diabetes. This is the most common cause of peripheral neuropathy. Autoimmune diseases, such as rheumatoid arthritis and systemic lupus erythematosus. Nerve diseases that are passed from parent to child (inherited). Kidney disease. Thyroid disease. Other causes may include: Nerve injury. Pressure or stress on a nerve that lasts a long time. Lack (deficiency) of B vitamins. This can result from alcoholism, poor diet, or a restricted diet. Infections. Some medicines, such as cancer medicines (chemotherapy). Poisonous (toxic) substances, such as lead and mercury. Too little blood flowing to the legs. In some cases, the cause of this condition is not known. What are the signs or symptoms? Symptoms of this condition depend on which of your nerves is damaged. Symptoms in the legs, hands, and arms can include: Loss of feeling (numbness) in the feet, hands, or both. Tingling in the feet, hands, or both. Burning pain. Very sensitive skin. Weakness. Not being able to move a part of the body (paralysis). Clumsiness or poor coordination. Muscle twitching. Loss of balance. Symptoms in other parts of the body can include: Not being able to control your bladder. Feeling dizzy. Sexual problems. How is this diagnosed? Diagnosing and finding the cause of peripheral neuropathy can be difficult. Your health care provider will take your  medical history and do a physical exam. A neurological exam will also be done. This involves checking things that are affected by your brain, spinal cord, and nerves (nervous system). For example, your health care provider will check your reflexes, how you move, and what you can feel. You may have other tests, such as: Blood tests. Electromyogram (EMG) and nerve conduction tests. These tests check nerve function and how well the nerves are controlling the muscles. Imaging tests, such as a CT scan or MRI, to rule out other causes of your symptoms. Removing a small piece of nerve to be examined in a lab (nerve biopsy). Removing and examining a small amount of the fluid that surrounds the brain and spinal cord (lumbar puncture). How is this treated? Treatment for this condition may involve: Treating the underlying cause of the neuropathy, such as diabetes, kidney disease, or vitamin deficiencies. Stopping medicines that can cause neuropathy, such as chemotherapy. Medicine to help relieve pain. Medicines may include: Prescription or over-the-counter pain medicine. Anti-seizure medicine. Antidepressants. Pain-relieving patches that are applied to painful areas of skin. Surgery to relieve pressure on a nerve or to destroy a nerve that is causing pain. Physical therapy to help improve movement and balance. Devices to help you move around (assistive devices). Follow these instructions at home: Medicines Take over-the-counter and prescription medicines only as told by your health care provider. Do not take any other medicines without first asking your health care provider. Ask your health care provider if the medicine prescribed to you requires you to avoid driving or using machinery. Lifestyle  Do not use any products that contain nicotine or tobacco. These products include cigarettes, chewing tobacco, and vaping devices, such as e-cigarettes. Smoking keeps  blood from reaching damaged nerves. If you  need help quitting, ask your health care provider. Avoid or limit alcohol. Too much alcohol can cause a vitamin B deficiency, and vitamin B is needed for healthy nerves. Eat a healthy diet. This includes: Eating foods that are high in fiber, such as beans, whole grains, and fresh fruits and vegetables. Limiting foods that are high in fat and processed sugars, such as fried or sweet foods. General instructions  If you have diabetes, work closely with your health care provider to keep your blood sugar under control. If you have numbness in your feet: Check every day for signs of injury or infection. Watch for redness, warmth, and swelling. Wear padded socks and comfortable shoes. These help protect your feet. Develop a good support system. Living with peripheral neuropathy can be stressful. Consider talking with a mental health specialist or joining a support group. Use assistive devices and attend physical therapy as told by your health care provider. This may include using a walker or a cane. Keep all follow-up visits. This is important. Where to find more information [employer] of Neurological Disorders: MasterBoxes.it Contact a health care provider if: You have new signs or symptoms of peripheral neuropathy. You are struggling emotionally from dealing with peripheral neuropathy. Your pain is not well controlled. Get help right away if: You have an injury or infection that is not healing normally. You develop new weakness in an arm or leg. You have fallen or do so frequently. Summary Peripheral neuropathy is when the nerves in the arms or legs are damaged, resulting in numbness, weakness, or pain. There are many causes of peripheral neuropathy, including diabetes, pinched nerves, vitamin deficiencies, autoimmune disease, and hereditary conditions. Diagnosing and finding the cause of peripheral neuropathy can be difficult. Your health care provider will take your medical  history, do a physical exam, and do tests, including blood tests and nerve function tests. Treatment involves treating the underlying cause of the neuropathy and taking medicines to help control pain. Physical therapy and assistive devices may also help. This information is not intended to replace advice given to you by your health care provider. Make sure you discuss any questions you have with your health care provider. Document Revised: 11/07/2020 Document Reviewed: 11/07/2020 Elsevier Patient Education  [place].  Travel Vaccine Information Vaccines (immunizations) can protect you from certain diseases. If you plan to travel to another country, see your health care provider or a travel medicine specialist to discuss: Where you are going. Include all countries in your travel schedule. How long you are staying. What you will be doing. Based on this information, your health care provider may recommend: Routine vaccines. These vaccines are standard for all children and adults. Travel vaccines: For most travelers. These vaccines are recommended for most travelers before foreign travel. For some travelers. These vaccines may be necessary based on the destination country or region. It is important to see your health care provider at least 4-6 weeks before you travel, and bring your vaccine records. This allows time for recommended vaccines to take effect. It also provides enough time for you to get vaccines that must be given in a series over a period of days or weeks. If you have fewer than 4 weeks before you leave, you should still see your health care provider. You might still benefit from vaccines or medicines. What are routine vaccines? Routine vaccines are shots that can protect you from common diseases in many parts of the  world. Most routine vaccines are given at certain ages starting in childhood. It is important that you are up to date on your routine vaccines before you travel.  Routine vaccines include: An annual flu (influenza) vaccine. The annual influenza vaccine sometimes differs for the [place] and [place] hemispheres. You should: Get both vaccines if you are traveling to the other hemisphere and you have a chronic medical condition. Get the vaccine shortly before or during the flu season, and only if the vaccine in your country differs from the vaccine in your destination country. Get the other influenza vaccine either before leaving the country or shortly after arriving at the destination country. Age-related vaccines. Infants 6-11 months old should receive a measles, mumps, and rubella (MMR) vaccine before traveling to another country. Children and adults should be up to date with all recommended vaccines. Adults 74 years or older should talk to their health care provider about getting a vaccine against a certain type of pneumonia (pneumococcal) and a vaccine against shingles (herpes zoster). Extra doses of certain vaccines (booster vaccines), such as Tdap (tetanus, diphtheria, and pertussis). What are recommended vaccines? Recommended travel vaccines change over time. Your health care provider can tell you what vaccines are recommended before your trip. Recommended vaccines will depend on: The country or countries of travel. Whether you will be traveling to rural areas. How long you will be traveling. The season of the year. Your health status. Your vaccine history. For most travelers The following vaccines are recommended for most international travelers, depending on the country or countries you are traveling to: Hepatitis A. Typhoid. For some travelers Additional vaccines may be required when traveling to certain countries, due to a disease being common in a particular area or an ongoing outbreak of a disease. The following vaccines may be recommended based on where you are traveling: Yellow fever vaccine. This is required before traveling to certain  countries in [place] and [place]. Get the yellow fever vaccine at an approved center at least 10 days before your trip. You will receive a stamp, certificate, or other proof of yellow fever immunization. If proof of immunization is incomplete or inaccurate, you could be medically isolated (quarantined), denied entry, or given another dose of vaccine at the travel site. If it has been longer than 10 years since you received the yellow fever vaccine, another dose is required. Meningococcal vaccine. This may be required prior to travel to parts of [place] and [place]. Get this vaccine at least 10 days before your trip. After 10 days, most people show immunity to meningococcal disease. Proof of meningococcal immunization is required by the [place] for any person taking part in a Muslim pilgrimage. You may not receive a visa if you are not able to provide proof of immunization. If it has been longer than 3 years since your last immunization, another dose may be required. Polio vaccine. If you travel to a country where there is a higher risk of getting polio, you may need a booster dose. Polio is a routine vaccine that most people receive as a child. Even if you completed the vaccine series as a child, you may need a booster dose before traveling to high-risk countries. Infants and children may need to follow an accelerated schedule to complete the polio vaccine series before traveling to high-risk countries. Some countries may require you to show proof that you have been vaccinated. Depending on your travel plans, you may need additional vaccines, such as:  Hepatitis B. Rabies. Tick-borne encephalitis. Cholera. Where to find more information Centers for Disease Control and Prevention (CDC): http://www.wolf.info/ [facility]): RoleLink.com.br U.S. Department of Health and Human Services: www.vaccines.gov Summary Vaccines (immunizations) can protect you from  certain diseases. See your health care provider at least 4-6 weeks before you travel, and bring your vaccine records. This allows time for vaccines to take effect. Vaccines for travelers include routine vaccines, recommended travel vaccines, and geographically required travel vaccines. The most commonly recommended travel vaccines are the hepatitis A and typhoid vaccines. This information is not intended to replace advice given to you by your health care provider. Make sure you discuss any questions you have with your health care provider. Document Revised: 04/24/2020 Document Reviewed: 04/24/2020 Elsevier Patient Education  [place].

## 2021-11-08 NOTE — Progress Notes (Signed)
Established patient visit   Patient: George [name]   DOB: 1951/03/02   71 y.o. Male  MRN: [id number] Visit Date: 11/08/2021  Chief Complaint  Patient presents with   Follow-up   Subjective    HPI  Patient presents for chronic follow-up. Patient complaining of right leg "funny" sensation, sometimes feels like a tingling sensation. Denies numbness or back pain. Sometimes has unsteady balance. Reports his right foot has a burning sensation. States when he bends is unable to come up due decreased muscle strength. Denies knee pain. Using CPAP every night for 6-8 hours. Reports feeling more refreshed.  Also has complaint of ear fullness and having trouble hearing. Has a hx of excessive earwax. Used debrox drops yesterday.  HTN: Pt denies chest pain, palpitations, dizziness or syncope. Reports no issues with lower extremity as he was having in the past. Taking medication as directed without side effects.      11/08/2021   10:03 AM 07/16/2021    9:24 AM 05/03/2021    9:49 AM 12/27/2020    3:08 PM 12/13/2020   11:16 AM  Depression screen PHQ 2/9  Decreased Interest 0 0 1 0 0  Down, Depressed, Hopeless 0 0 1 0 0  PHQ - 2 Score 0 0 2 0 0  Altered sleeping 0  0 0 0  Tired, decreased energy _0 Change in appetite 0  0 0 0  Feeling bad or failure about yourself  0  0 0 0  Trouble concentrating _1 0  Moving slowly or fidgety/restless 1  1 0 0  Suicidal thoughts 0  0 0 0  PHQ-9 Score _2 Difficult doing work/chores Not difficult at all  Somewhat difficult Not difficult at all       11/08/2021   10:03 AM 05/03/2021    9:49 AM 12/27/2020    3:08 PM 11/14/2020    1:32 PM  GAD 7 : Generalized Anxiety Score  Nervous, Anxious, on Edge 0 0 0 0  Control/stop worrying 0 1 0 0  Worry too much - different things 0 1 0 0  Trouble relaxing 0 1 0 0  Restless 0 0 0 0  Easily annoyed or irritable 0 1 0 0  Afraid - awful might happen 0 0 0 0  Total GAD 7 Score 0 4 0 0  Anxiety  Difficulty Not difficult at all Somewhat difficult Not difficult at all         Medications: Outpatient Medications Prior to Visit  Medication Sig   apixaban (ELIQUIS) 5 MG TABS tablet Take 1 tablet (5 mg total) by mouth 2 (two) times daily.   carvedilol (COREG) 25 MG tablet Take 1 tablet (25 mg total) by mouth 2 (two) times daily with a meal.   Cholecalciferol (VITAMIN D3) 5000 units TABS 5,000 IU OTC vitamin D3 daily.   clotrimazole-betamethasone (LOTRISONE) cream Apply 1 application topically as needed (for skin).   dofetilide (TIKOSYN) 500 MCG capsule Take 1 capsule (500 mcg total) by mouth 2 (two) times daily.   ezetimibe (ZETIA) 10 MG tablet TAKE 1 TABLET BY MOUTH EVERY DAY   fexofenadine (ALLEGRA) 180 MG tablet TAKE 1 TABLET (180 MG TOTAL) BY MOUTH DAILY. DURING ALLERGY SEASON   fluticasone (FLONASE) 50 MCG/ACT nasal spray USE 1 SPRAY IN EACH NOSTRIL AFTER SINUS RINSES TWICE DAILY.   furosemide (LASIX) 20 MG tablet TAKE 1 TABLET BY MOUTH EVERY DAY   hydrocortisone (  ANUSOL-HC) 2.5 % rectal cream Place 1 application rectally 2 (two) times daily.   losartan (COZAAR) 25 MG tablet Take 0.5 tablets (12.5 mg total) by mouth daily.   Multiple Vitamins-Minerals (CENTRUM ADULTS) TABS Take 1 tablet by mouth daily.   spironolactone (ALDACTONE) 25 MG tablet Take 1 tablet (25 mg total) by mouth daily.   tamsulosin (FLOMAX) 0.4 MG CAPS capsule Take 0.4 mg by mouth daily.   No facility-administered medications prior to visit.    Review of Systems Review of Systems:  A fourteen system review of systems was performed and found to be positive as per HPI.  Last CBC Lab Results  Component Value Date   WBC 6.0 04/26/2021   HGB 13.8 04/26/2021   HCT 40.1 04/26/2021   MCV 91 04/26/2021   MCH 31.4 04/26/2021   RDW 12.2 04/26/2021   PLT 182 16/09/3708   Last metabolic panel Lab Results  Component Value Date   GLUCOSE 128 (H) 09/26/2021   NA 139 09/26/2021   K 4.7 09/26/2021   CL 100  09/26/2021   CO2 23 09/26/2021   BUN 16 09/26/2021   CREATININE 0.97 09/26/2021   EGFR 85 09/26/2021   CALCIUM 10.5 (H) 09/26/2021   PROT 7.5 05/03/2021   ALBUMIN 4.5 05/03/2021   LABGLOB 3.0 05/03/2021   AGRATIO 1.5 05/03/2021   BILITOT 1.6 (H) 05/03/2021   ALKPHOS 89 05/03/2021   AST 18 05/03/2021   ALT 16 05/03/2021   ANIONGAP 6 12/22/2020   Last lipids Lab Results  Component Value Date   CHOL 224 (H) 05/03/2021   HDL 56 05/03/2021   LDLCALC 144 (H) 05/03/2021   TRIG 134 05/03/2021   CHOLHDL 4.0 05/03/2021   Last hemoglobin A1c Lab Results  Component Value Date   HGBA1C 5.6 05/03/2021   Last thyroid functions Lab Results  Component Value Date   TSH 1.010 12/18/2020   Last vitamin D Lab Results  Component Value Date   VD25OH 59.6 09/24/2018       Objective    BP 119/75   Pulse 82   Temp 97.7 F (36.5 C)   Ht 5' 8.5" (1.74 m)   Wt 168 lb (76.2 kg)   SpO2 97%   BMI 25.17 kg/m  BP Readings from Last 3 Encounters:  11/08/21 119/75  07/30/21 122/84  07/16/21 (!) 93/54   Wt Readings from Last 3 Encounters:  11/08/21 168 lb (76.2 kg)  07/30/21 169 lb (76.7 kg)  07/16/21 170 lb (77.1 kg)    Physical Exam  General:  Pleasant and cooperative, appropriate for stated age.  Neuro:  Alert and oriented,  extra-ocular muscles intact  HEENT:  Normocephalic, atraumatic, cerumen impaction of both ears, neck supple  Skin:  no gross rash, warm, pink. Cardiac:  RRR, S1 S2 Respiratory: CTA B/L  Vascular:  Ext warm, no cyanosis apprec.; cap RF less 2 sec. Psych:  No HI/SI, judgement and insight good, Euthymic mood. Full Affect.   No results found for any visits on 11/08/21.  Assessment & Plan      Problem List Items Addressed This Visit       Cardiovascular and Mediastinum   Essential hypertension (Chronic)    -Stable. Continue current medication regimen. Will collect CMP for medication monitoring with lab visit tomorrow. Will continue to monitor.         Respiratory   OSA (obstructive sleep apnea) - Primary    -Recommend to continue compliance with CPAP machine.  Nervous and Auditory   Bilateral impacted cerumen    -Ear lavage attempted but unable to remove earwax. Advised to continue with Debrox.        Other   Hyperlipidemia LDL goal <100    -Last lipid panel: LDL 144, on Zetia 10 mg daily. Advised to schedule fasting lab visit for tomorrow to repeat lipid panel. Will continue to monitor.      Other Visit Diagnoses     Paresthesia       Relevant Orders   Nerve conduction test      Paresthesia: -Discussed with patient and wife symptoms are suggestive of neuropathy. Will place order for nerve conduction study and also place lab orders for B12/folate/magnesium, CBC and A1c for further evaluation. Advised to resume magnesium supplement.  Return in about 4 months (around 03/10/2022) for Medicare Wellness; lab visit tomorrow for FBW and B12/folate/magnesium .        Lorrene Reid, PA-C  [facility] at [facility] [phone] (phone) [phone] (fax)  [place]

## 2021-11-08 NOTE — Assessment & Plan Note (Signed)
-  Stable. Continue current medication regimen. Will collect CMP for medication monitoring with lab visit tomorrow. Will continue to monitor.

## 2021-11-08 NOTE — Assessment & Plan Note (Signed)
-  Ear lavage attempted but unable to remove earwax. Advised to continue with Debrox.

## 2021-11-08 NOTE — Assessment & Plan Note (Signed)
-  Last lipid panel: LDL 144, on Zetia 10 mg daily. Advised to schedule fasting lab visit for tomorrow to repeat lipid panel. Will continue to monitor.

## 2021-11-09 ENCOUNTER — Other Ambulatory Visit: Payer: Medicare HMO

## 2021-11-09 DIAGNOSIS — E559 Vitamin D deficiency, unspecified: Secondary | ICD-10-CM | POA: Diagnosis not present

## 2021-11-09 DIAGNOSIS — E785 Hyperlipidemia, unspecified: Secondary | ICD-10-CM

## 2021-11-09 DIAGNOSIS — Z0000 Encounter for general adult medical examination without abnormal findings: Secondary | ICD-10-CM

## 2021-11-09 DIAGNOSIS — E538 Deficiency of other specified B group vitamins: Secondary | ICD-10-CM

## 2021-11-09 DIAGNOSIS — I10 Essential (primary) hypertension: Secondary | ICD-10-CM | POA: Diagnosis not present

## 2021-11-10 LAB — COMPREHENSIVE METABOLIC PANEL
ALT: 15 IU/L (ref 0–44)
AST: 21 IU/L (ref 0–40)
Albumin/Globulin Ratio: 1.7 (ref 1.2–2.2)
Albumin: 4.3 g/dL (ref 3.9–4.9)
Alkaline Phosphatase: 81 IU/L (ref 44–121)
BUN/Creatinine Ratio: 16 (ref 10–24)
BUN: 15 mg/dL (ref 8–27)
Bilirubin Total: 1.1 mg/dL (ref 0.0–1.2)
CO2: 25 mmol/L (ref 20–29)
Calcium: 9.6 mg/dL (ref 8.6–10.2)
Chloride: 103 mmol/L (ref 96–106)
Creatinine, Ser: 0.92 mg/dL (ref 0.76–1.27)
Globulin, Total: 2.5 g/dL (ref 1.5–4.5)
Glucose: 102 mg/dL — ABNORMAL HIGH (ref 70–99)
Potassium: 4.2 mmol/L (ref 3.5–5.2)
Sodium: 140 mmol/L (ref 134–144)
Total Protein: 6.8 g/dL (ref 6.0–8.5)
eGFR: 91 mL/min/{1.73_m2} (ref >59)

## 2021-11-10 LAB — CBC
Hematocrit: 40 % (ref 37.5–51.0)
Hemoglobin: 13.5 g/dL (ref 13.0–17.7)
MCH: 31.5 pg (ref 26.6–33.0)
MCHC: 33.8 g/dL (ref 31.5–35.7)
MCV: 94 fL (ref 79–97)
Platelets: 185 10*3/uL (ref 150–450)
RBC: 4.28 x10E6/uL (ref 4.14–5.80)
RDW: 12.3 % (ref 11.6–15.4)
WBC: 6.1 10*3/uL (ref 3.4–10.8)

## 2021-11-10 LAB — TSH: TSH: 1.56 u[IU]/mL (ref 0.450–4.500)

## 2021-11-10 LAB — LIPID PANEL
Chol/HDL Ratio: 3.2 ratio (ref 0.0–5.0)
Cholesterol, Total: 168 mg/dL (ref 100–199)
HDL: 53 mg/dL (ref >39)
LDL Chol Calc (NIH): 97 mg/dL (ref 0–99)
Triglycerides: 99 mg/dL (ref 0–149)
VLDL Cholesterol Cal: 18 mg/dL (ref 5–40)

## 2021-11-10 LAB — B12 AND FOLATE PANEL
Folate: >20 ng/mL (ref >3.0)
Vitamin B-12: 418 pg/mL (ref 232–1245)

## 2021-11-10 LAB — MAGNESIUM: Magnesium: 2 mg/dL (ref 1.6–2.3)

## 2021-11-10 LAB — HEMOGLOBIN A1C
Est. average glucose Bld gHb Est-mCnc: 120 mg/dL
Hgb A1c MFr Bld: 5.8 % — ABNORMAL HIGH (ref 4.8–5.6)

## 2021-11-12 DIAGNOSIS — G4733 Obstructive sleep apnea (adult) (pediatric): Secondary | ICD-10-CM | POA: Diagnosis not present

## 2021-11-24 ENCOUNTER — Encounter: Payer: Self-pay | Admitting: Physician Assistant

## 2021-11-26 DIAGNOSIS — H2511 Age-related nuclear cataract, right eye: Secondary | ICD-10-CM | POA: Diagnosis not present

## 2021-11-26 NOTE — Progress Notes (Signed)
Remote pacemaker transmission.   

## 2021-11-27 ENCOUNTER — Other Ambulatory Visit: Payer: Self-pay | Admitting: Internal Medicine

## 2021-11-27 ENCOUNTER — Other Ambulatory Visit (HOSPITAL_COMMUNITY): Payer: Self-pay | Admitting: Physician Assistant

## 2021-11-27 NOTE — Telephone Encounter (Signed)
George [name] ordered this test. I have no idea how to find out the status of this. Can you check this for me? Thanks

## 2021-11-29 ENCOUNTER — Other Ambulatory Visit: Payer: Self-pay | Admitting: Physician Assistant

## 2021-11-29 DIAGNOSIS — E785 Hyperlipidemia, unspecified: Secondary | ICD-10-CM

## 2021-12-05 ENCOUNTER — Telehealth: Payer: Self-pay | Admitting: Physician Assistant

## 2021-12-05 NOTE — Telephone Encounter (Signed)
Patient states he was supposed to have gotten referral to have a nerve conduction study but has not heard anything. Please advise. [phone]

## 2021-12-06 ENCOUNTER — Other Ambulatory Visit: Payer: Self-pay | Admitting: Physician Assistant

## 2021-12-06 DIAGNOSIS — R202 Paresthesia of skin: Secondary | ICD-10-CM

## 2021-12-06 NOTE — Telephone Encounter (Signed)
Patient aware however that is his cardio office and they do not do those there they said he needs neuro. Please advise.

## 2021-12-06 NOTE — Telephone Encounter (Signed)
Patient wife aware

## 2021-12-11 ENCOUNTER — Other Ambulatory Visit: Payer: Self-pay | Admitting: Physician Assistant

## 2021-12-11 DIAGNOSIS — R202 Paresthesia of skin: Secondary | ICD-10-CM

## 2021-12-11 DIAGNOSIS — G4733 Obstructive sleep apnea (adult) (pediatric): Secondary | ICD-10-CM | POA: Diagnosis not present

## 2021-12-11 NOTE — Progress Notes (Signed)
New referral placed to [facility] for nerve conduction study for further evaluation of paresthesia. Patient and wife aware. MA, PA-C

## 2021-12-12 ENCOUNTER — Encounter: Payer: Self-pay | Admitting: Neurology

## 2021-12-13 DIAGNOSIS — G4733 Obstructive sleep apnea (adult) (pediatric): Secondary | ICD-10-CM | POA: Diagnosis not present

## 2021-12-23 ENCOUNTER — Other Ambulatory Visit: Payer: Self-pay | Admitting: Physician Assistant

## 2021-12-23 DIAGNOSIS — E785 Hyperlipidemia, unspecified: Secondary | ICD-10-CM

## 2022-01-08 ENCOUNTER — Ambulatory Visit (INDEPENDENT_AMBULATORY_CARE_PROVIDER_SITE_OTHER): Payer: Medicare HMO | Admitting: Physician Assistant

## 2022-01-08 ENCOUNTER — Encounter: Payer: Self-pay | Admitting: Physician Assistant

## 2022-01-08 VITALS — BP 114/70 | HR 71 | Ht 68.5 in | Wt 169.1 lb

## 2022-01-08 DIAGNOSIS — H6123 Impacted cerumen, bilateral: Secondary | ICD-10-CM

## 2022-01-08 NOTE — Patient Instructions (Signed)

## 2022-01-08 NOTE — Progress Notes (Signed)
  Established patient acute visit   Patient: George [name]   DOB: 1950-05-21   71 y.o. Male  MRN: [id number] Visit Date: 01/08/2022  Chief Complaint  Patient presents with   Ear Fullness   Subjective    HPI  Patient presents for cerumen impaction of both ears. Reports has been using Debrox ear drops. Both ears feel full. No fever or otorrhea.     Medications: Outpatient Medications Prior to Visit  Medication Sig   carvedilol (COREG) 25 MG tablet Take 1 tablet (25 mg total) by mouth 2 (two) times daily with a meal. Please keep upcoming appointment for future refills. Thank you.   Cholecalciferol (VITAMIN D3) 5000 units TABS 5,000 IU OTC vitamin D3 daily.   clotrimazole-betamethasone (LOTRISONE) cream Apply 1 application topically as needed (for skin).   dofetilide (TIKOSYN) 500 MCG capsule Take 1 capsule (500 mcg total) by mouth 2 (two) times daily.   ELIQUIS 5 MG TABS tablet TAKE 1 TABLET BY MOUTH TWICE A DAY   ezetimibe (ZETIA) 10 MG tablet TAKE 1 TABLET BY MOUTH EVERY DAY   fluticasone (FLONASE) 50 MCG/ACT nasal spray USE 1 SPRAY IN EACH NOSTRIL AFTER SINUS RINSES TWICE DAILY.   furosemide (LASIX) 20 MG tablet TAKE 1 TABLET BY MOUTH EVERY DAY   hydrocortisone (ANUSOL-HC) 2.5 % rectal cream Place 1 application rectally 2 (two) times daily.   losartan (COZAAR) 25 MG tablet Take 0.5 tablets (12.5 mg total) by mouth daily.   Multiple Vitamins-Minerals (CENTRUM ADULTS) TABS Take 1 tablet by mouth daily.   spironolactone (ALDACTONE) 25 MG tablet Take 1 tablet (25 mg total) by mouth daily.   tamsulosin (FLOMAX) 0.4 MG CAPS capsule Take 0.4 mg by mouth daily.   fexofenadine (ALLEGRA) 180 MG tablet TAKE 1 TABLET (180 MG TOTAL) BY MOUTH DAILY. DURING ALLERGY SEASON (Patient not taking: Reported on 01/08/2022)   No facility-administered medications prior to visit.    Review of Systems Review of Systems:  A fourteen system review of systems was performed and found to be positive as per  HPI.     Objective    BP 114/70   Pulse 71   Ht 5' 8.5" (1.74 m)   Wt 169 lb 1.9 oz (76.7 kg)   SpO2 96%   BMI 25.34 kg/m    Physical Exam  General:  Well Developed, well nourished, appropriate for stated age.  Neuro:  Alert and oriented,  extra-ocular muscles intact  HEENT:  Normocephalic, atraumatic, bilateral cerumen impaction Skin:  no gross rash, warm, pink. Cardiac:  RRR, S1 S2 Respiratory: CTA B/L  Vascular:  Ext warm, no cyanosis apprec.; cap RF less 2 sec. Psych:  No HI/SI, judgement and insight good, Euthymic mood. Full Affect.   No results found for any visits on 01/08/22.  Assessment & Plan     Unable to perform ear lavage. Advised patient cerumen impaction is deep and minimal amount was removed with curette. Recommend referral to ENT for cerumen removal, patient is agreeable.    Return if symptoms worsen or fail to improve.        Lorrene Reid, PA-C  [facility] at [facility] [phone] (phone) [phone] (fax)  [place]

## 2022-01-10 DIAGNOSIS — G4733 Obstructive sleep apnea (adult) (pediatric): Secondary | ICD-10-CM | POA: Diagnosis not present

## 2022-01-12 DIAGNOSIS — G4733 Obstructive sleep apnea (adult) (pediatric): Secondary | ICD-10-CM | POA: Diagnosis not present

## 2022-01-23 ENCOUNTER — Ambulatory Visit (INDEPENDENT_AMBULATORY_CARE_PROVIDER_SITE_OTHER): Payer: Medicare HMO

## 2022-01-23 DIAGNOSIS — I428 Other cardiomyopathies: Secondary | ICD-10-CM | POA: Diagnosis not present

## 2022-01-26 LAB — CUP PACEART REMOTE DEVICE CHECK
Battery Remaining Longevity: 37 mo
Battery Voltage: 2.92 V
Brady Statistic AP VP Percent: 29.41 %
Brady Statistic AP VS Percent: 0.09 %
Brady Statistic AS VP Percent: 69.56 %
Brady Statistic AS VS Percent: 0.94 %
Brady Statistic RA Percent Paced: 29.97 %
Brady Statistic RV Percent Paced: 98.97 %
Date Time Interrogation Session: 20231109122329
Implantable Lead Connection Status: 753985
Implantable Lead Connection Status: 753985
Implantable Lead Connection Status: 753985
Implantable Lead Implant Date: 20170802
Implantable Lead Implant Date: 20170802
Implantable Lead Implant Date: 20170802
Implantable Lead Location: 753858
Implantable Lead Location: 753859
Implantable Lead Location: 753860
Implantable Lead Model: 5076
Implantable Lead Model: 5076
Implantable Pulse Generator Implant Date: 20170802
Lead Channel Impedance Value: 1064 Ohm
Lead Channel Impedance Value: 1102 Ohm
Lead Channel Impedance Value: 399 Ohm
Lead Channel Impedance Value: 456 Ohm
Lead Channel Impedance Value: 456 Ohm
Lead Channel Impedance Value: 494 Ohm
Lead Channel Impedance Value: 532 Ohm
Lead Channel Impedance Value: 532 Ohm
Lead Channel Impedance Value: 551 Ohm
Lead Channel Impedance Value: 703 Ohm
Lead Channel Impedance Value: 779 Ohm
Lead Channel Impedance Value: 817 Ohm
Lead Channel Impedance Value: 931 Ohm
Lead Channel Impedance Value: 988 Ohm
Lead Channel Pacing Threshold Amplitude: 0.625 V
Lead Channel Pacing Threshold Amplitude: 1.125 V
Lead Channel Pacing Threshold Amplitude: 1.25 V
Lead Channel Pacing Threshold Pulse Width: 0.4 ms
Lead Channel Pacing Threshold Pulse Width: 0.4 ms
Lead Channel Pacing Threshold Pulse Width: 0.8 ms
Lead Channel Sensing Intrinsic Amplitude: 15.25 mV
Lead Channel Sensing Intrinsic Amplitude: 15.25 mV
Lead Channel Sensing Intrinsic Amplitude: 2.75 mV
Lead Channel Sensing Intrinsic Amplitude: 2.75 mV
Lead Channel Setting Pacing Amplitude: 1.5 V
Lead Channel Setting Pacing Amplitude: 1.75 V
Lead Channel Setting Pacing Amplitude: 2.5 V
Lead Channel Setting Pacing Pulse Width: 0.4 ms
Lead Channel Setting Pacing Pulse Width: 0.8 ms
Lead Channel Setting Sensing Sensitivity: 2.8 mV
Zone Setting Status: 755011
Zone Setting Status: 755011

## 2022-01-28 ENCOUNTER — Ambulatory Visit (INDEPENDENT_AMBULATORY_CARE_PROVIDER_SITE_OTHER): Payer: Medicare HMO | Admitting: Physician Assistant

## 2022-01-28 ENCOUNTER — Encounter: Payer: Self-pay | Admitting: Physician Assistant

## 2022-01-28 VITALS — BP 119/78 | HR 70 | Resp 18 | Ht 68.5 in | Wt 166.0 lb

## 2022-01-28 DIAGNOSIS — R2681 Unsteadiness on feet: Secondary | ICD-10-CM

## 2022-01-28 DIAGNOSIS — R202 Paresthesia of skin: Secondary | ICD-10-CM | POA: Diagnosis not present

## 2022-01-28 DIAGNOSIS — R69 Illness, unspecified: Secondary | ICD-10-CM | POA: Diagnosis not present

## 2022-01-28 DIAGNOSIS — K649 Unspecified hemorrhoids: Secondary | ICD-10-CM | POA: Diagnosis not present

## 2022-01-28 DIAGNOSIS — G4733 Obstructive sleep apnea (adult) (pediatric): Secondary | ICD-10-CM | POA: Diagnosis not present

## 2022-01-28 DIAGNOSIS — F419 Anxiety disorder, unspecified: Secondary | ICD-10-CM

## 2022-01-28 MED ORDER — DULOXETINE HCL 20 MG PO CPEP: 20.0000 mg | ORAL_CAPSULE | Freq: Every day | ORAL | 1 refills | Status: DC

## 2022-01-28 MED ORDER — HYDROCORTISONE (PERIANAL) 2.5 % EX CREA: 1.0000 | TOPICAL_CREAM | Freq: Two times a day (BID) | CUTANEOUS | 1 refills | Status: AC

## 2022-01-28 NOTE — Progress Notes (Signed)
Established patient visit   Patient: George [name]   DOB: 10/14/1950   71 y.o. Male  MRN: [id number] Visit Date: 01/28/2022  Chief Complaint  Patient presents with   Fatigue        Leg Pain    Bilateral   Subjective    HPI HPI     Fatigue    Additional comments:          Leg Pain    Additional comments: Bilateral      Last edited by Gemma Payor, CMA on 01/28/2022  1:12 PM.      Patient presents with c/o fatigue. Patient recently returned from [place] trip. Reports did not take his CPAP machine, did not use for 10 days. Has been on machine for 2 nights and starting to feel better. Patient's wife reports he has been more ill and anxious. Patient is waiting on ENT appointment for cerumen impaction of both ears, states feels like there are crickets and cannot hear. No fever, diarrhea, night sweats, abdominal pain, chest pain or cough. Patient's wife states he has fallen twice the last 3 weeks, pt reports feels unsteady on his feet at times. Continues with tingling and at time burning sensation of right leg. Has an appointment scheduled next week with neurology.      01/28/2022    1:51 PM 01/08/2022    3:17 PM 11/08/2021   10:03 AM 07/16/2021    9:24 AM 05/03/2021    9:49 AM  Depression screen PHQ 2/9  Decreased Interest 1 0 0 0 1  Down, Depressed, Hopeless 0 0 0 0 1  PHQ - 2 Score 1 0 0 0 2  Altered sleeping 0 0 0  0  Tired, decreased energy 1 0 1  1  Change in appetite 0 0 0  0  Feeling bad or failure about yourself  0 0 0  0  Trouble concentrating 1 0 1  1  Moving slowly or fidgety/restless 0 0 1  1  Suicidal thoughts 0 0 0  0  PHQ-9 Score 3 0 3  5  Difficult doing work/chores Somewhat difficult  Not difficult at all  Somewhat difficult      01/28/2022    1:52 PM 01/08/2022    3:17 PM 11/08/2021   10:03 AM 05/03/2021    9:49 AM  GAD 7 : Generalized Anxiety Score  Nervous, Anxious, on Edge 0 0 0 0  Control/stop worrying 0 0 0 1  Worry too much -  different things 0 0 0 1  Trouble relaxing 0 0 0 1  Restless  0 0 0  Easily annoyed or irritable 1 0 0 1  Afraid - awful might happen 0 0 0 0  Total GAD 7 Score  0 0 4  Anxiety Difficulty Somewhat difficult  Not difficult at all Somewhat difficult     Medications: Outpatient Medications Prior to Visit  Medication Sig   carvedilol (COREG) 25 MG tablet Take 1 tablet (25 mg total) by mouth 2 (two) times daily with a meal. Please keep upcoming appointment for future refills. Thank you.   Cholecalciferol (VITAMIN D3) 5000 units TABS 5,000 IU OTC vitamin D3 daily.   clotrimazole-betamethasone (LOTRISONE) cream Apply 1 application topically as needed (for skin).   dofetilide (TIKOSYN) 500 MCG capsule Take 1 capsule (500 mcg total) by mouth 2 (two) times daily.   ELIQUIS 5 MG TABS tablet TAKE 1 TABLET BY MOUTH TWICE A DAY   ezetimibe (ZETIA) 10  MG tablet TAKE 1 TABLET BY MOUTH EVERY DAY   fexofenadine (ALLEGRA) 180 MG tablet TAKE 1 TABLET (180 MG TOTAL) BY MOUTH DAILY. DURING ALLERGY SEASON   fluticasone (FLONASE) 50 MCG/ACT nasal spray USE 1 SPRAY IN EACH NOSTRIL AFTER SINUS RINSES TWICE DAILY.   furosemide (LASIX) 20 MG tablet TAKE 1 TABLET BY MOUTH EVERY DAY   losartan (COZAAR) 25 MG tablet Take 0.5 tablets (12.5 mg total) by mouth daily.   Multiple Vitamins-Minerals (CENTRUM ADULTS) TABS Take 1 tablet by mouth daily.   spironolactone (ALDACTONE) 25 MG tablet Take 1 tablet (25 mg total) by mouth daily.   tamsulosin (FLOMAX) 0.4 MG CAPS capsule Take 0.4 mg by mouth daily.   [DISCONTINUED] hydrocortisone (ANUSOL-HC) 2.5 % rectal cream Place 1 application rectally 2 (two) times daily.   No facility-administered medications prior to visit.    Review of Systems Review of Systems:  A fourteen system review of systems was performed and found to be positive as per HPI.  Last CBC Lab Results  Component Value Date   WBC 6.1 11/09/2021   HGB 13.5 11/09/2021   HCT 40.0 11/09/2021   MCV 94  11/09/2021   MCH 31.5 11/09/2021   RDW 12.3 11/09/2021   PLT 185 76/72/0947   Last metabolic panel Lab Results  Component Value Date   GLUCOSE 102 (H) 11/09/2021   NA 140 11/09/2021   K 4.2 11/09/2021   CL 103 11/09/2021   CO2 25 11/09/2021   BUN 15 11/09/2021   CREATININE 0.92 11/09/2021   EGFR 91 11/09/2021   CALCIUM 9.6 11/09/2021   PROT 6.8 11/09/2021   ALBUMIN 4.3 11/09/2021   LABGLOB 2.5 11/09/2021   AGRATIO 1.7 11/09/2021   BILITOT 1.1 11/09/2021   ALKPHOS 81 11/09/2021   AST 21 11/09/2021   ALT 15 11/09/2021   ANIONGAP 6 12/22/2020   Last lipids Lab Results  Component Value Date   CHOL 168 11/09/2021   HDL 53 11/09/2021   LDLCALC 97 11/09/2021   TRIG 99 11/09/2021   CHOLHDL 3.2 11/09/2021   Last hemoglobin A1c Lab Results  Component Value Date   HGBA1C 5.8 (H) 11/09/2021   Last thyroid functions Lab Results  Component Value Date   TSH 1.560 11/09/2021   Last vitamin D Lab Results  Component Value Date   VD25OH 59.6 09/24/2018       Objective    BP 119/78 (BP Location: Left Arm, Patient Position: Sitting, Cuff Size: Normal)   Pulse 70   Resp 18   Ht 5' 8.5" (1.74 m)   Wt 166 lb (75.3 kg)   SpO2 97%   BMI 24.87 kg/m  BP Readings from Last 3 Encounters:  01/28/22 119/78  01/08/22 114/70  11/08/21 119/75   Wt Readings from Last 3 Encounters:  01/28/22 166 lb (75.3 kg)  01/08/22 169 lb 1.9 oz (76.7 kg)  11/08/21 168 lb (76.2 kg)    Physical Exam  General:  Pleasant and cooperative, appropriate for stated age.  Neuro:  Alert and oriented,  extra-ocular muscles intact  HEENT:  Normocephalic, atraumatic, bilateral cerumen impaction, neck supple  Skin:  no gross rash, warm, pink. Cardiac:  RRR, S1 S2 Respiratory: CTA B/L  Vascular:  Ext warm, no cyanosis apprec.; no pitting edema Psych:  No HI/SI, judgement and insight good, Euthymic mood. Full Affect.   No results found for any visits on 01/28/22.  Assessment & Plan      Problem  List Items Addressed This Visit  Respiratory   OSA (obstructive sleep apnea)   Other Visit Diagnoses     Anxiety    -  Primary   Relevant Medications   DULoxetine (CYMBALTA) 20 MG capsule   Hemorrhoids, unspecified hemorrhoid type       Relevant Medications   hydrocortisone (ANUSOL-HC) 2.5 % rectal cream   Paresthesia       Unsteady gait          OSA: -Discussed with patient acute fatigue likely secondary to not using CPAP machine during his trip. Recommend to resume regular use of CPAP machine. If fatigue fails to improve or worsen then recommend further evaluation.  Paresthesia: -Patient is scheduled with neurology 02/04/2022 for NCS. Discussed if symptoms related to neuropathy then likely also affecting balance. Will await neurology consult/recommendations. Will start duloxetine 20 mg daily for anxiety which can also provide pain relief.   Unsteady gait: -Discussed with patient and wife referral to PT. Both plan to start silver sneakers program at their local gym in [place]. Discussed bilateral cerumen impaction possibly also contributing to unsteady gait. Provided ENT referral information.  Anxiety: -Will start duloxetine 20 mg daily for mood which can also provide some benefits for nerve pain. No med interaction found with Tikosyn. Advised to notify the office if unable to tolerate medication. Recommend reassessing mood and medication therapy in 3 months.   Return in about 3 months (around 04/30/2022) for Mood- started med, HTN, HLD.        Lorrene Reid, PA-C  [facility] [phone] (phone) [phone] (fax)  [place]

## 2022-02-01 ENCOUNTER — Other Ambulatory Visit: Payer: Self-pay

## 2022-02-01 ENCOUNTER — Other Ambulatory Visit: Payer: Self-pay | Admitting: Nurse Practitioner

## 2022-02-01 DIAGNOSIS — R202 Paresthesia of skin: Secondary | ICD-10-CM

## 2022-02-04 ENCOUNTER — Ambulatory Visit: Payer: Medicare HMO | Admitting: Neurology

## 2022-02-04 DIAGNOSIS — M5412 Radiculopathy, cervical region: Secondary | ICD-10-CM

## 2022-02-04 DIAGNOSIS — G5601 Carpal tunnel syndrome, right upper limb: Secondary | ICD-10-CM

## 2022-02-04 DIAGNOSIS — R202 Paresthesia of skin: Secondary | ICD-10-CM | POA: Diagnosis not present

## 2022-02-04 DIAGNOSIS — G629 Polyneuropathy, unspecified: Secondary | ICD-10-CM

## 2022-02-04 NOTE — Procedures (Signed)
[facility]  [address]  [place], [place] [address] Tel: [phone] Fax:  [phone] Test Date:  02/04/2022  Patient: George [name] DOB: [birth year] Physician: Kai Levins, MD  Sex: Male Height: 5' 8.5" Ref Phys: Lorrene Reid,  ID#: [id number]   Technician:    Patient Complaints: This is a 71 year old male with tingling, burning, and weakness in legs.  NCV & EMG Findings: Extensive electrodiagnostic evaluation of the right upper and lower limbs show: Right sural, superficial peroneal, and median sensory responses are absent. Right ulnar and radial sensory responses are within normal limits. Right peroneal (EDB) motor response is absent. Right tibial (AH) motor response shows reduced amplitude (3.2 mV). Right median (APB) motor response shows prolonged distal onset latency (5.1 ms). Right peroneal (TA) and ulnar (ADM) motor responses are within normal limits. Right H reflex is absent. Chronic motor axon loss changes without accompanying active denervation changes are seen in the right tibialis anterior, medial head of gastrocnemius, abductor pollicis brevis, first dorsal interosseous, and extensor indicis proprius muscles.  Impression: This is an abnormal electrodiagnostic evaluation. The findings are most consistent with the following: Evidence of a generalized large fiber sensorimotor polyneuropathy, axon loss in type, mild in degree electrically. Evidence of a right median mononeuropathy at or distal to the wrist, consistent with carpal tunnel syndrome, severe in degree electrically. The residuals of an old intraspinal canal lesion (ie: motor radiculopathy) at the right C8 root, mild in degree electrically. No electrodiagnostic evidence of a right lumbosacral (L3-S1) motor radiculopathy.   ___________________________ Kai Levins, MD    Nerve Conduction Studies Anti Sensory Summary Table   Stim Site NR Peak (ms) Norm Peak (ms) O-P Amp (V) Norm  O-P Amp  Right Median Anti Sensory (2nd Digit)  34.8C  Wrist NR  <3.8  >10  Right Radial Anti Sensory (Base 1st Digit)  34.8C  Wrist    2.1 <2.8 14.9 >10  Right Sup Peroneal Anti Sensory (Ant Lat Mall)  34.8C  12 cm NR  <4.6  >3  Right Sural Anti Sensory (Lat Mall)  34.8C  Calf NR  <4.6  >3  Right Ulnar Anti Sensory (5th Digit)  34.8C  Wrist    3.1 <3.2 7.6 >5   Motor Summary Table   Stim Site NR Onset (ms) Norm Onset (ms) O-P Amp (mV) Norm O-P Amp Site1 Site2 Delta-0 (ms) Dist (cm) Vel (m/s) Norm Vel (m/s)  Right Median Motor (Abd Poll Brev)  34.8C  Wrist    5.1 <4.0 5.1 >5 Elbow Wrist 5.0 26.0 52 >50  Elbow    10.1  4.5         Right Peroneal Motor (Ext Dig Brev)  34.8C  Ankle NR  <6.0  >2.5 B Fib Ankle  0.0  >40  B Fib NR     Poplt B Fib  0.0  >40  Poplt NR            Right Peroneal TA Motor (Tib Ant)  34.8C  Fib Head    3.2 <4.5 4.3 >3 Poplit Fib Head 1.9 10.0 53 >40  Poplit    5.1  3.7         Right Tibial Motor (Abd Hall Brev)  34.8C  Ankle    4.7 <6.0 3.2 >4 Knee Ankle 9.8 40.0 41 >40  Knee    14.5  2.0         Right Ulnar Motor (Abd Dig Minimi)  34.8C  Wrist  2.3 <3.1 10.4 >7 B Elbow Wrist 3.9 22.5 58 >50  B Elbow    6.2  8.3  A Elbow B Elbow 1.8 10.0 56 >50  A Elbow    8.0  7.7          H Reflex Studies   NR H-Lat (ms) Lat Norm (ms) L-R H-Lat (ms)  Right Tibial (Gastroc)  34.8C  NR  <35    EMG   Side Muscle Ins Act Fibs Fasc Recrt Dur. Amp. Poly. Activation Comment  Right AntTibialis Nml Nml Nml 1- 1+ 1+ Nml Nml N/A  Right Gastroc Nml Nml Nml 1- 1+ 1+ Nml Nml N/A  Right VastusLat Nml Nml Nml Nml Nml Nml Nml Nml N/A  Right BicepsFemL Nml Nml Nml Nml Nml Nml Nml Nml N/A  Right GluteusMed Nml Nml Nml Nml Nml Nml Nml Nml N/A  Right 1stDorInt Nml Nml Nml 1- 1+ 1+ Nml Nml N/A  Right Ext Indicis Nml Nml Nml 2- 1+ 1+ 1+ Nml N/A  Right Abd Poll Brev Nml Nml Nml 1- 1+ 1+ 1+ Nml N/A  Right Biceps Nml Nml Nml Nml Nml Nml Nml Nml N/A  Right Triceps Nml Nml  Nml Nml Nml Nml Nml Nml N/A  Right Deltoid Nml Nml Nml Nml Nml Nml Nml Nml N/A      Waveforms:

## 2022-02-04 NOTE — Progress Notes (Signed)
Remote pacemaker transmission.   

## 2022-02-10 DIAGNOSIS — G4733 Obstructive sleep apnea (adult) (pediatric): Secondary | ICD-10-CM | POA: Diagnosis not present

## 2022-02-12 DIAGNOSIS — G4733 Obstructive sleep apnea (adult) (pediatric): Secondary | ICD-10-CM | POA: Diagnosis not present

## 2022-02-14 ENCOUNTER — Telehealth: Payer: Self-pay | Admitting: Neurology

## 2022-02-14 ENCOUNTER — Telehealth: Payer: Self-pay | Admitting: *Deleted

## 2022-02-14 NOTE — Telephone Encounter (Signed)
EMG has been refaxed to [facility], [place].  Called patients wife and informed her that I have refaxed results.

## 2022-02-14 NOTE — Telephone Encounter (Signed)
Patients wife called and was asking if the results from his test on his legs that was completed on 11/20 from [facility] had been reviewed. She said they were supposed to send to [place]. Please advise. Ashland Wiseman Zimmerman Rumple, CMA

## 2022-02-14 NOTE — Telephone Encounter (Signed)
Pt's wife called in stating they have not heard anything about the EMG results and the referring office has not gotten them either.

## 2022-02-18 ENCOUNTER — Encounter: Payer: Self-pay | Admitting: Neurology

## 2022-02-18 ENCOUNTER — Other Ambulatory Visit: Payer: Self-pay | Admitting: Nurse Practitioner

## 2022-02-18 DIAGNOSIS — R2681 Unsteadiness on feet: Secondary | ICD-10-CM

## 2022-02-18 DIAGNOSIS — G5793 Unspecified mononeuropathy of bilateral lower limbs: Secondary | ICD-10-CM

## 2022-02-18 DIAGNOSIS — R202 Paresthesia of skin: Secondary | ICD-10-CM

## 2022-02-18 NOTE — Telephone Encounter (Signed)
I have been able to review them. The results are very abnormal.  They indicate the following -  1. Evidence of a generalized large fiber sensorimotor polyneuropathy, axon loss in type, mild in degree electrically. 2. Evidence of a right median mononeuropathy at or distal to the wrist, consistent with carpal tunnel syndrome, severe in degree electrically. 3. The residuals of an old intraspinal canal lesion (ie: motor radiculopathy) at the right C8 root, mild in degree electrically. 4. No electrodiagnostic evidence of a right lumbosacral (L3-S1) motor radiculopathy.  I see that he has been having leg pain, but I've not evaluated him before, so I can't really relate symptoms with the findings.  I have placed a referral back to [facility] who performed this test for further evaluation and treatment.

## 2022-02-18 NOTE — Telephone Encounter (Signed)
Patient states that Neurology called him this morning and scheduled an appointment for 04/03/22.

## 2022-02-20 DIAGNOSIS — H6123 Impacted cerumen, bilateral: Secondary | ICD-10-CM | POA: Diagnosis not present

## 2022-02-21 ENCOUNTER — Other Ambulatory Visit: Payer: Self-pay | Admitting: Internal Medicine

## 2022-03-12 DIAGNOSIS — G4733 Obstructive sleep apnea (adult) (pediatric): Secondary | ICD-10-CM | POA: Diagnosis not present

## 2022-03-14 DIAGNOSIS — G4733 Obstructive sleep apnea (adult) (pediatric): Secondary | ICD-10-CM | POA: Diagnosis not present

## 2022-03-21 ENCOUNTER — Other Ambulatory Visit: Payer: Self-pay | Admitting: Internal Medicine

## 2022-03-26 ENCOUNTER — Other Ambulatory Visit: Payer: Self-pay | Admitting: Internal Medicine

## 2022-03-27 NOTE — Progress Notes (Signed)
Initial neurology clinic note  SERVICE DATE: 04/03/22  Reason for Evaluation: Consultation requested by Ronnell Freshwater, NP for an opinion regarding paresthesias and unsteady gait. My final recommendations will be communicated back to the requesting physician by way of shared medical record or letter to requesting physician via [place] mail.  HPI: This is George [name], a 72 y.o. right-handed male with a medical history of pre-diabetes, HTN, pAfib (on Tikosyn and eliquis), 2nd degree AV block s/p CRT-P, NICM, OSA on CPAP, anxiety who presents to neurology clinic with the chief complaint of paresthesias and unsteady gait and falls. The patient is alone today.  Patient has had difficulties for about 2 years. He was having trouble getting off the floor when kneeling down. He spoke to cardiology who felt it may be a blood flow issue and recommended compression stockings. He had blood flow tests done that he states were normal. About 1.5 years ago, he noticed tingling, burning in his right leg. He feels like it is starting on his left leg as well, but not as bad. He feels like he is losing strength throughout his body, including his arms. He denies ever really having pain in his arms. He does endorse significant cramps in both hands.  When he is standing, he feels like his is leaning forward. When he is walking, he is off balance and will bump into things. He takes a lot of medications and has wondered if it is medication side effects. He does not feel dizzy or lightheaded. He has fallen about 2-3 times in the past year. It is usually getting his feet tangled up or once the dog tripped him up. Patient mentions that his wife tells him that he does not pick up his feet enough. He denies freezing while walking or trying to move.  Patient was started on Cymbalta 20 mg daily in November for "attitude problems." This has helped a bit with his pain. He has not tried any other medications and has not done any  physical therapy.  Recent EMG performed by me on 02/04/22 showed a mild axonal large fiber polyneuropathy, severe right carpal tunnel, and the residuals of an old right C8 radiculopathy. Patient was referred to neurology for further work up and management.  The patient does not impaired sweating, heat or cold intolerance, gastroparetic early satiety, postprandial abdominal bloating, constipation, or bowel or bladder dyscontrol. He does endorse dry eyes and mouth. He also endorses some lightheadedness when standing requiring him to stand for a few seconds before this resolves.  He does not report any constitutional symptoms like fever, night sweats, anorexia or unintentional weight loss.  EtOH use: No  Restrictive diet? No Family history of neuropathy/myopathy/NM disease? Brother had neuropathy (does not know why)  Of note, patient was previously seen in this office by Dr. Delice Lesch for cognitive complaints. Neuropsych testing in 2022 showed mild cognitive impairment (fairly benign findings per assessment).   MEDICATIONS:  Outpatient Encounter Medications as of 04/03/2022  Medication Sig   carvedilol (COREG) 25 MG tablet TAKE 1 TABLET BY MOUTH 2 TIMES DAILY WITH A MEAL. PLEASE CALL TO SCHEDULE APPT. [phone]   Cholecalciferol (VITAMIN D3) 5000 units TABS 5,000 IU OTC vitamin D3 daily.   clotrimazole-betamethasone (LOTRISONE) cream Apply 1 application topically as needed (for skin).   dofetilide (TIKOSYN) 500 MCG capsule Take 1 capsule (500 mcg total) by mouth 2 (two) times daily.   DULoxetine (CYMBALTA) 20 MG capsule Take 1 capsule (20 mg total) by  mouth daily.   ELIQUIS 5 MG TABS tablet TAKE 1 TABLET BY MOUTH TWICE A DAY   ezetimibe (ZETIA) 10 MG tablet TAKE 1 TABLET BY MOUTH EVERY DAY   fexofenadine (ALLEGRA) 180 MG tablet TAKE 1 TABLET (180 MG TOTAL) BY MOUTH DAILY. DURING ALLERGY SEASON   fluticasone (FLONASE) 50 MCG/ACT nasal spray USE 1 SPRAY IN EACH NOSTRIL AFTER SINUS RINSES TWICE  DAILY. (Patient taking differently: prn)   furosemide (LASIX) 20 MG tablet TAKE 1 TABLET BY MOUTH EVERY DAY   hydrocortisone (ANUSOL-HC) 2.5 % rectal cream Place 1 Application rectally 2 (two) times daily.   losartan (COZAAR) 25 MG tablet Take 0.5 tablets (12.5 mg total) by mouth daily.   Multiple Vitamins-Minerals (CENTRUM ADULTS) TABS Take 1 tablet by mouth daily.   spironolactone (ALDACTONE) 25 MG tablet Take 1 tablet (25 mg total) by mouth daily.   tamsulosin (FLOMAX) 0.4 MG CAPS capsule Take 0.4 mg by mouth daily.   No facility-administered encounter medications on file as of 04/03/2022.    PAST MEDICAL HISTORY: Past Medical History:  Diagnosis Date   AICD (automatic cardioverter/defibrillator) present    Arthritis THUMBS   LBBB (left bundle branch block)    bilat. BBB- PPM   OSA (obstructive sleep apnea) 05/17/2021   Peyronie's disease    Presence of permanent cardiac pacemaker     PAST SURGICAL HISTORY: Past Surgical History:  Procedure Laterality Date   CARDIOVASCULAR STRESS TEST  10-18-2002  DR  CRENSHAW   NORMAL ADENOSINE CARDIOLITE/ EF 60%   CARDIOVERSION N/A 08/18/2020   Procedure: CARDIOVERSION;  Surgeon: Geralynn Rile, MD;  Location: [facility];  Service: Cardiovascular;  Laterality: N/A;   CARDIOVERSION N/A 11/15/2020   Procedure: CARDIOVERSION;  Surgeon: Sueanne Margarita, MD;  Location: [facility];  Service: Cardiovascular;  Laterality: N/A;   CATARACT EXTRACTION W/ INTRAOCULAR LENS IMPLANT  2006   LEFT EYE   EP IMPLANTABLE DEVICE N/A 10/18/2015   Procedure: BiV Pacemaker Insertion CRT-P;  Surgeon: [name], MD;  Location: [facility];  Service: Cardiovascular;  Laterality: N/A;   INGUINAL HERNIA REPAIR Right 02/23/2019   Procedure: RIGHT INGUINAL HERNIA REPAIR WITH MESH;  Surgeon: Erroll Luna, MD;  Location: [facility];  Service: General;  Laterality: Right;   LAPAROSCOPIC CHOLECYSTECTOMY  10-09-2007   LEFT HEART CATH AND  CORONARY ANGIOGRAPHY N/A 01/30/2018   Procedure: LEFT HEART CATH AND CORONARY ANGIOGRAPHY;  Surgeon: Belva Crome, MD;  Location: [facility];  Service: Cardiovascular;  Laterality: N/A;   NESBIT PROCEDURE  01/17/2012   Procedure: NESBIT PROCEDURE;  Surgeon: [name], MD;  Location: [facility];  Service: Urology;  Laterality: N/A;  16 dot plication    REPAIR DETACHED RETINA, LEFT EYE  1993   REPAIR LEFT INGUINAL HERNIA  2005    ALLERGIES: No Known Allergies  FAMILY HISTORY: Family History  Problem Relation Age of Onset   Diabetes Mother    Hypertension Mother    Hyperlipidemia Mother    Heart disease Father    Hyperlipidemia Father    Hypertension Father    Arrhythmia Father        paf   Healthy Daughter    Healthy Son    Heart disease Paternal Grandfather    Heart attack Paternal Grandfather     SOCIAL HISTORY: Social History   Tobacco Use   Smoking status: Never   Smokeless tobacco: Never  Vaping Use   Vaping Use: Never used  Substance Use Topics   Alcohol use: No   Drug use: No   Social History   Social History Narrative   Right handed    Lives with wife   Are you right handed or left handed?    Are you currently employed ?    What is your current occupation?retired   Do you live at home alone?   Who lives with you?    What type of home do you live in: 1 story or 2 story? one   Caffeine 1/2 liter a day      OBJECTIVE: PHYSICAL EXAM: BP 112/62   Pulse (!) 58   Ht '5\' 8"'$  (1.727 m)   Wt 169 lb 3.2 oz (76.7 kg)   SpO2 98%   BMI 25.73 kg/m   General: General appearance: Awake and alert. No distress. Cooperative with exam.  Skin: No obvious rash or jaundice. HEENT: Atraumatic. Anicteric. Lungs: Non-labored breathing on room air  Extremities: No edema. No obvious deformity.  Musculoskeletal: No obvious joint swelling. Psych: Affect appropriate.  Neurological: Mental Status: Alert. Speech fluent. No pseudobulbar  affect Cranial Nerves: CNII: No RAPD. Visual fields grossly intact. CNIII, IV, VI: PERRL. No nystagmus. EOMI. CN V: Facial sensation intact bilaterally to fine touch. [place] clench strong. CN VII: Facial muscles symmetric and strong. No ptosis at rest. CN VIII: Hearing grossly intact bilaterally. CN IX: No hypophonia. CN X: Palate elevates symmetrically. CN XI: Full strength shoulder shrug bilaterally. CN XII: Tongue protrusion full and midline. No atrophy or fasciculations. No significant dysarthria Motor: Tone is normal. No fasciculations in extremities. ?atrophy of bilateral APB.  Individual muscle group testing (MRC grade out of 5):  Movement     Neck flexion 5    Neck extension 5     Right Left   Shoulder abduction 5 5   Shoulder adduction 5 5   Elbow flexion 5 5   Elbow extension 5 5   Wrist extension 5 5   Wrist flexion 5 5   Finger abduction - FDI 5 5   Finger abduction - ADM 5 5   Finger extension 5 5   Finger distal flexion - 2/'3 5 5   '$ Finger distal flexion - 4/'5 5 5   '$ Thumb flexion - FPL 5 5   Thumb abduction - APB 4+ 4+    Hip flexion 5 5   Hip extension 5 5   Hip adduction 5 5   Hip abduction 5 5   Knee extension 5 5   Knee flexion 5 5   Dorsiflexion 5 5   Plantarflexion 5 5   Great toe extension 5 5   Great toe flexion 5 5     Reflexes:  Right Left   Bicep 1+ 1+   Tricep 1+ 1+   BrRad 1+ 1+   Knee 1+ 1+   Ankle 0 0    Pathological Reflexes: Babinski: flexor response bilaterally Hoffman: absent bilaterally Troemner: absent bilaterally Sensation: Pinprick: 50% of normal in first 2 digits of hand bilaterally; absent in left foot increasing to normal at calf, ?intact in RLE. Vibration: Intact in bilateral upper extremities, absent in bilateral great toes, diminished at ankles, full at knees. Proprioception: Intact in bilateral great toes Coordination: Intact finger-to- nose-finger bilaterally. Romberg with sway. Gait: Able to rise from  chair with arms crossed unassisted. Wide-based gait. Difficulty with tandem walk. Able to walk on toes and heels.  Lab and Test Review: Internal labs: 11/09/21: B12: 418 Folate  wnl HbA1c: 5.8 TSH: 1.56 CBC and CMP wnl  Imaging: EMG (02/04/22): NCV & EMG Findings: Extensive electrodiagnostic evaluation of the right upper and lower limbs show: Right sural, superficial peroneal, and median sensory responses are absent. Right ulnar and radial sensory responses are within normal limits. Right peroneal (EDB) motor response is absent. Right tibial (AH) motor response shows reduced amplitude (3.2 mV). Right median (APB) motor response shows prolonged distal onset latency (5.1 ms). Right peroneal (TA) and ulnar (ADM) motor responses are within normal limits. Right H reflex is absent. Chronic motor axon loss changes without accompanying active denervation changes are seen in the right tibialis anterior, medial head of gastrocnemius, abductor pollicis brevis, first dorsal interosseous, and extensor indicis proprius muscles.   Impression: This is an abnormal electrodiagnostic evaluation. The findings are most consistent with the following: Evidence of a generalized large fiber sensorimotor polyneuropathy, axon loss in type, mild in degree electrically. Evidence of a right median mononeuropathy at or distal to the wrist, consistent with carpal tunnel syndrome, severe in degree electrically. The residuals of an old intraspinal canal lesion (ie: motor radiculopathy) at the right C8 root, mild in degree electrically. No electrodiagnostic evidence of a right lumbosacral (L3-S1) motor radiculopathy.  CT head wo contrast (03/03/20): FINDINGS: Brain: Age-appropriate atrophy, with progression since 2008. Negative for hydrocephalus. Negative for acute infarct, hemorrhage, mass. Small chronic lacunar infarction in the left anterior frontal white matter.   Vascular: Negative for hyperdense vessel   Skull:  Negative   Sinuses/Orbits: Paranasal sinuses clear. Left cataract extraction. No orbital lesion.   Other: None   IMPRESSION: No acute abnormality. Age-appropriate atrophy. Small chronic infarct left frontal white matter.  ASSESSMENT: George [name] is a 72 y.o. male who presents for evaluation of burning and tingling in legs and imbalance. He has a relevant medical history of pre-diabetes, HTN, pAfib (on Tikosyn and eliquis), 2nd degree AV block s/p CRT-P, NICM, OSA on CPAP, anxiety. His neurological examination is pertinent for distal sensory deficit in bilateral legs and sensory deficit in first 2 digits of bilateral hands. Available diagnostic data is significant for EMG showing neuropathy and right carpal tunnel. His B12 was 418 and HbA1c 5.8. The symptoms in patient's legs is most consistent with a distal symmetric polyneuropathy. His only known risk factor currently is mild pre-diabetes. I will send labs for other treatable causes. I will send to PT for balance. The symptoms in hands are most consistent with bilateral carpal tunnel syndrome.  PLAN: -Blood work: IFE, B1 -Physical therapy for imbalance -Symptom relief: Increase Cymbalta to 40 mg qhs -Lidocaine cream PRN -Cock up wrist splints for CTS  -Return to clinic in 6 months  The impression above as well as the plan as outlined below were extensively discussed with the patient who voiced understanding. All questions were answered to their satisfaction.  The patient was counseled on pertinent fall precautions per the printed material provided today, and as noted under the "Patient Instructions" section below.  When available, results of the above investigations and possible further recommendations will be communicated to the patient via telephone/MyChart. Patient to call office if not contacted after expected testing turnaround time.   Total time spent reviewing records, interview, history/exam, documentation, and coordination  of care on day of encounter:  55 min   Thank you for allowing me to participate in patient's care.  If I can answer any additional questions, I would be pleased to do so.  Kai Levins, MD   CC: Mariel Kansky,  Herb Grays, PA-C No address on file  CC: Referring provider: Ronnell Freshwater, NP [address],  [place] [id number]

## 2022-04-03 ENCOUNTER — Ambulatory Visit: Payer: Medicare HMO | Admitting: Neurology

## 2022-04-03 ENCOUNTER — Encounter: Payer: Self-pay | Admitting: Neurology

## 2022-04-03 ENCOUNTER — Other Ambulatory Visit (INDEPENDENT_AMBULATORY_CARE_PROVIDER_SITE_OTHER): Payer: Medicare HMO

## 2022-04-03 VITALS — BP 112/62 | HR 58 | Ht 68.0 in | Wt 169.2 lb

## 2022-04-03 DIAGNOSIS — F419 Anxiety disorder, unspecified: Secondary | ICD-10-CM | POA: Diagnosis not present

## 2022-04-03 DIAGNOSIS — G629 Polyneuropathy, unspecified: Secondary | ICD-10-CM

## 2022-04-03 DIAGNOSIS — R69 Illness, unspecified: Secondary | ICD-10-CM | POA: Diagnosis not present

## 2022-04-03 DIAGNOSIS — R2689 Other abnormalities of gait and mobility: Secondary | ICD-10-CM

## 2022-04-03 DIAGNOSIS — G5603 Carpal tunnel syndrome, bilateral upper limbs: Secondary | ICD-10-CM

## 2022-04-03 DIAGNOSIS — R269 Unspecified abnormalities of gait and mobility: Secondary | ICD-10-CM

## 2022-04-03 MED ORDER — DULOXETINE HCL 20 MG PO CPEP: 40.0000 mg | ORAL_CAPSULE | Freq: Every day | ORAL | 3 refills | Status: DC

## 2022-04-03 NOTE — Patient Instructions (Addendum)
I saw you today for burning and tingling in legs and imbalance. The cause of your symptoms is nerve damage called neuropathy.   I want to look for treatable causes with blood work today. I will be in touch when I have the results.  I want to increase your Cymbalta to 40 mg at night.  You can also try Lidocaine cream as needed. Apply wear you have pain, tingling, or burning. Wear gloves to prevent your hands being numb. This can be bought over the counter at any drug store or online.   I am referring you to physical therapy to help with your balance.  Your EMG also showed you have carpal tunnel syndrome, which is a pinched nerve at your wrist that can cause pain in your hands and make them weak. I recommend wrist splints at night. Cock up wrist splint for carpal tunnel symptoms can be bought in local drug stores or online. This should especially be worn at night while sleeping.      I want to see you back in clinic in 6 months, or sooner if needed.   The physicians and staff at [facility] are committed to providing excellent care. You may receive a survey requesting feedback about your experience at our office. We strive to receive "very good" responses to the survey questions. If you feel that your experience would prevent you from giving the office a "very good " response, please contact our office to try to remedy the situation. We may be reached at [phone]. Thank you for taking the time out of your busy day to complete the survey.  Kai Levins, MD [facility]  Preventing Falls at [address] are common, often dreaded events in the lives of older people. Aside from the obvious injuries and even death that may result, fall can cause wide-ranging consequences including loss of independence, mental decline, decreased activity and mobility. Younger people are also at risk of falling, especially those with chronic illnesses and fatigue.  Ways to reduce risk for  falling Examine diet and medications. Warm foods and alcohol dilate blood vessels, which can lead to dizziness when standing. Sleep aids, antidepressants and pain medications can also increase the likelihood of a fall.  Get a vision exam. Poor vision, cataracts and glaucoma increase the chances of falling.  Check foot gear. Shoes should fit snugly and have a sturdy, nonskid sole and a broad, low heel  Participate in a physician-approved exercise program to build and maintain muscle strength and improve balance and coordination. Programs that use ankle weights or stretch bands are excellent for muscle-strengthening. Water aerobics programs and low-impact Tai Chi programs have also been shown to improve balance and coordination.  Increase vitamin D intake. Vitamin D improves muscle strength and increases the amount of calcium the body is able to absorb and deposit in bones.  How to prevent falls from common hazards Floors - Remove all loose wires, cords, and throw rugs. Minimize clutter. Make sure rugs are anchored and smooth. Keep furniture in its usual place.  Chairs -- Use chairs with straight backs, armrests and firm seats. Add firm cushions to existing pieces to add height.  Bathroom - Install grab bars and non-skid tape in the tub or shower. Use a bathtub transfer bench or a shower chair with a back support Use an elevated toilet seat and/or safety rails to assist standing from a low surface. Do not use towel racks or bathroom tissue holders to help you stand.  Lighting - Make sure halls, stairways, and entrances are well-lit. Install a night light in your bathroom or hallway. Make sure there is a light switch at the top and bottom of the staircase. Turn lights on if you get up in the middle of the night. Make sure lamps or light switches are within reach of the bed if you have to get up during the night.  Kitchen - Install non-skid rubber mats near the sink and stove. Clean spills  immediately. Store frequently used utensils, pots, pans between waist and eye level. This helps prevent reaching and bending. Sit when getting things out of lower cupboards.  Living room/ Bedrooms - Place furniture with wide spaces in between, giving enough room to move around. Establish a route through the living room that gives you something to hold onto as you walk.  Stairs - Make sure treads, rails, and rugs are secure. Install a rail on both sides of the stairs. If stairs are a threat, it might be helpful to arrange most of your activities on the lower level to reduce the number of times you must climb the stairs.  Entrances and doorways - Install metal handles on the walls adjacent to the doorknobs of all doors to make it more secure as you travel through the doorway.  Tips for maintaining balance Keep at least one hand free at all times. Try using a backpack or fanny pack to hold things rather than carrying them in your hands. Never carry objects in both hands when walking as this interferes with keeping your balance.  Attempt to swing both arms from front to back while walking. This might require a conscious effort if Parkinson's disease has diminished your movement. It will, however, help you to maintain balance and posture, and reduce fatigue.  Consciously lift your feet off of the ground when walking. Shuffling and dragging of the feet is a common culprit in losing your balance.  When trying to navigate turns, use a "U" technique of facing forward and making a wide turn, rather than pivoting sharply.  Try to stand with your feet shoulder-length apart. When your feet are close together for any length of time, you increase your risk of losing your balance and falling.  Do one thing at a time. Don't try to walk and accomplish another task, such as reading or looking around. The decrease in your automatic reflexes complicates motor function, so the less distraction, the better.  Do not  wear rubber or gripping soled shoes, they might "catch" on the floor and cause tripping.  Move slowly when changing positions. Use deliberate, concentrated movements and, if needed, use a grab bar or walking aid. Count 15 seconds between each movement. For example, when rising from a seated position, wait 15 seconds after standing to begin walking.  If balance is a continuous problem, you might want to consider a walking aid such as a cane, walking stick, or walker. Once you've mastered walking with help, you might be ready to try it on your own again.

## 2022-04-04 ENCOUNTER — Ambulatory Visit: Payer: Medicare HMO | Admitting: Internal Medicine

## 2022-04-04 DIAGNOSIS — G629 Polyneuropathy, unspecified: Secondary | ICD-10-CM | POA: Diagnosis not present

## 2022-04-04 DIAGNOSIS — R262 Difficulty in walking, not elsewhere classified: Secondary | ICD-10-CM | POA: Diagnosis not present

## 2022-04-04 DIAGNOSIS — M6281 Muscle weakness (generalized): Secondary | ICD-10-CM | POA: Diagnosis not present

## 2022-04-08 ENCOUNTER — Telehealth: Payer: Self-pay

## 2022-04-08 ENCOUNTER — Ambulatory Visit: Payer: Medicare HMO | Attending: Cardiovascular Disease | Admitting: Internal Medicine

## 2022-04-08 ENCOUNTER — Encounter: Payer: Self-pay | Admitting: Internal Medicine

## 2022-04-08 VITALS — Ht 68.0 in

## 2022-04-08 DIAGNOSIS — Z79899 Other long term (current) drug therapy: Secondary | ICD-10-CM

## 2022-04-08 DIAGNOSIS — I480 Paroxysmal atrial fibrillation: Secondary | ICD-10-CM

## 2022-04-08 DIAGNOSIS — I442 Atrioventricular block, complete: Secondary | ICD-10-CM | POA: Diagnosis not present

## 2022-04-08 DIAGNOSIS — Z950 Presence of cardiac pacemaker: Secondary | ICD-10-CM

## 2022-04-08 DIAGNOSIS — I428 Other cardiomyopathies: Secondary | ICD-10-CM | POA: Diagnosis not present

## 2022-04-08 MED ORDER — ATORVASTATIN CALCIUM 20 MG PO TABS: ORAL_TABLET | ORAL | 3 refills | Status: DC

## 2022-04-08 NOTE — Telephone Encounter (Signed)
  Patient Consent for Virtual Visit        George [name] has provided verbal consent on 04/08/2022 for a virtual visit (video or telephone).   CONSENT FOR VIRTUAL VISIT FOR:  George [name]  By participating in this virtual visit I agree to the following:  I hereby voluntarily request, consent and authorize [facility] and its employed or contracted physicians, physician assistants, nurse practitioners or other licensed health care professionals (the Practitioner), to provide me with telemedicine health care services (the "Services") as deemed necessary by the treating Practitioner. I acknowledge and consent to receive the Services by the Practitioner via telemedicine. I understand that the telemedicine visit will involve communicating with the Practitioner through live audiovisual communication technology and the disclosure of certain medical information by electronic transmission. I acknowledge that I have been given the opportunity to request an in-person assessment or other available alternative prior to the telemedicine visit and am voluntarily participating in the telemedicine visit.  I understand that I have the right to withhold or withdraw my consent to the use of telemedicine in the course of my care at any time, without affecting my right to future care or treatment, and that the Practitioner or I may terminate the telemedicine visit at any time. I understand that I have the right to inspect all information obtained and/or recorded in the course of the telemedicine visit and may receive copies of available information for a reasonable fee.  I understand that some of the potential risks of receiving the Services via telemedicine include:  Delay or interruption in medical evaluation due to technological equipment failure or disruption; Information transmitted may not be sufficient (e.g. poor resolution of images) to allow for appropriate medical decision making by the Practitioner;  and/or  In rare instances, security protocols could fail, causing a breach of personal health information.  Furthermore, I acknowledge that it is my responsibility to provide information about my medical history, conditions and care that is complete and accurate to the best of my ability. I acknowledge that Practitioner's advice, recommendations, and/or decision may be based on factors not within their control, such as incomplete or inaccurate data provided by me or distortions of diagnostic images or specimens that may result from electronic transmissions. I understand that the practice of medicine is not an exact science and that Practitioner makes no warranties or guarantees regarding treatment outcomes. I acknowledge that a copy of this consent can be made available to me via my patient portal ([facility]), or I can request a printed copy by calling the office of [facility].    I understand that my insurance will be billed for this visit.   I have read or had this consent read to me. I understand the contents of this consent, which adequately explains the benefits and risks of the Services being provided via telemedicine.  I have been provided ample opportunity to ask questions regarding this consent and the Services and have had my questions answered to my satisfaction. I give my informed consent for the services to be provided through the use of telemedicine in my medical care

## 2022-04-08 NOTE — Patient Instructions (Addendum)
Medication Instructions:  Your physician has recommended you make the following change in your medication:   Stop Zetia  Begin Lipitor '20mg'$  - 1 tablet by mouth 2 times per week.  *If you need a refill on your cardiac medications before your next appointment, please call your pharmacy*   Lab Work: BMET and Magnesium - please call [phone] to schedule lab appointment at Dr [employer] office.  If you have labs (blood work) drawn today and your tests are completely normal, you will receive your results only by: [place] (if you have MyChart) OR A paper copy in the mail If you have any lab test that is abnormal or we need to change your treatment, we will call you to review the results.   Testing/Procedures: None ordered.    Follow-Up: At [facility], you and your health needs are our priority.  As part of our continuing mission to provide you with exceptional heart care, we have created designated Provider Care Teams.  These Care Teams include your primary Cardiologist (physician) and Advanced Practice Providers (APPs -  Physician Assistants and Nurse Practitioners) who all work together to provide you with the care you need, when you need it.  We recommend signing up for the patient portal called "MyChart".  Sign up information is provided on this After Visit Summary.  MyChart is used to connect with patients for Virtual Visits (Telemedicine).  Patients are able to view lab/test results, encounter notes, upcoming appointments, etc.  Non-urgent messages can be sent to your provider as well.   To learn more about what you can do with MyChart, go to [email or web address].    Your next appointment:   6 months with Dr Caryl Comes

## 2022-04-08 NOTE — Progress Notes (Signed)
Electrophysiology TeleHealth Note   Due to national recommendations of social distancing due to COVID 19, an audio/video telehealth visit is felt to be most appropriate for this patient at this time.  See MyChart message from today for the patient's consent to telehealth for [facility].   Date:  04/08/2022   ID:  George [name], DOB [birth year], MRN [id number]  Location: patient's home  Provider location: [address], [place]  Evaluation Performed: Follow-up visit  PCP:  Lorrene Reid, PA-C  Cardiologist:     Electrophysiologist:  SK   Chief Complaint:  afib and heart blcok   History of Present Illness:    George [name] is a 72 y.o. male who presents via audio/video conferencing for a telehealth visit today.  Since last being seen in our clinic   for CRT-P Medtronic  implanted 8/17 for complete heart block, bilateral bundle branch block and NICM< afib detected on device 5/22 started on dofetilide 2022,  Rx OSA  Occ give out, >> weak and SOB. >> happened in [place]  Not clearly orthostatic     .   DATE TEST EF    7/17 Echo   45-50 %    11/18 Echo   55 %    11/19 LHC 40 % Nonobst CAD  3/22 Echo  50-55%     Date Cr K Hgb  1/20 0.82 4.2 13.7   8/23 0.92 4.2 13.5        , the patient reports  doing well  No bleeding   No questions        Past Surgical History:  Procedure Laterality Date   CARDIOVASCULAR STRESS TEST  10-18-2002  DR  CRENSHAW   NORMAL ADENOSINE CARDIOLITE/ EF 60%   CARDIOVERSION N/A 08/18/2020   Procedure: CARDIOVERSION;  Surgeon: Geralynn Rile, MD;  Location: [place];  Service: Cardiovascular;  Laterality: N/A;   CARDIOVERSION N/A 11/15/2020   Procedure: CARDIOVERSION;  Surgeon: Sueanne Margarita, MD;  Location: [facility];  Service: Cardiovascular;  Laterality: N/A;   CATARACT EXTRACTION W/ INTRAOCULAR LENS IMPLANT  2006   LEFT EYE   EP IMPLANTABLE DEVICE N/A 10/18/2015   Procedure: BiV Pacemaker Insertion  CRT-P;  Surgeon: [name], MD;  Location: [facility];  Service: Cardiovascular;  Laterality: N/A;   INGUINAL HERNIA REPAIR Right 02/23/2019   Procedure: RIGHT INGUINAL HERNIA REPAIR WITH MESH;  Surgeon: Erroll Luna, MD;  Location: [facility];  Service: General;  Laterality: Right;   LAPAROSCOPIC CHOLECYSTECTOMY  10-09-2007   LEFT HEART CATH AND CORONARY ANGIOGRAPHY N/A 01/30/2018   Procedure: LEFT HEART CATH AND CORONARY ANGIOGRAPHY;  Surgeon: Belva Crome, MD;  Location: [facility];  Service: Cardiovascular;  Laterality: N/A;   NESBIT PROCEDURE  01/17/2012   Procedure: NESBIT PROCEDURE;  Surgeon: [name], MD;  Location: [facility];  Service: Urology;  Laterality: N/A;  16 dot plication    REPAIR DETACHED RETINA, LEFT EYE  1993   REPAIR LEFT INGUINAL HERNIA  2005    Current Outpatient Medications  Medication Sig Dispense Refill   carvedilol (COREG) 25 MG tablet TAKE 1 TABLET BY MOUTH 2 TIMES DAILY WITH A MEAL. PLEASE CALL TO SCHEDULE APPT. [phone] 180 tablet 0   Cholecalciferol (VITAMIN D3) 5000 units TABS 5,000 IU OTC vitamin D3 daily. 90 tablet 3   clotrimazole-betamethasone (LOTRISONE) cream Apply 1 application topically as needed (for skin).     dofetilide (  TIKOSYN) 500 MCG capsule Take 1 capsule (500 mcg total) by mouth 2 (two) times daily. 180 capsule 2   DULoxetine (CYMBALTA) 20 MG capsule Take 2 capsules (40 mg total) by mouth at bedtime. 180 capsule 3   ELIQUIS 5 MG TABS tablet TAKE 1 TABLET BY MOUTH TWICE A DAY 180 tablet 2   ezetimibe (ZETIA) 10 MG tablet TAKE 1 TABLET BY MOUTH EVERY DAY 90 tablet 1   fexofenadine (ALLEGRA) 180 MG tablet TAKE 1 TABLET (180 MG TOTAL) BY MOUTH DAILY. DURING ALLERGY SEASON 90 tablet 1   fluticasone (FLONASE) 50 MCG/ACT nasal spray USE 1 SPRAY IN EACH NOSTRIL AFTER SINUS RINSES TWICE DAILY. (Patient taking differently: prn) 48 mL 1   furosemide (LASIX) 20 MG tablet TAKE 1 TABLET BY MOUTH  EVERY DAY 90 tablet 3   hydrocortisone (ANUSOL-HC) 2.5 % rectal cream Place 1 Application rectally 2 (two) times daily. 30 g 1   losartan (COZAAR) 25 MG tablet Take 0.5 tablets (12.5 mg total) by mouth daily. 45 tablet 3   Multiple Vitamins-Minerals (CENTRUM ADULTS) TABS Take 1 tablet by mouth daily.     spironolactone (ALDACTONE) 25 MG tablet Take 1 tablet (25 mg total) by mouth daily. 90 tablet 3   tamsulosin (FLOMAX) 0.4 MG CAPS capsule Take 0.4 mg by mouth daily.     No current facility-administered medications for this visit.    Allergies:   Patient has no known allergies.      ROS:  Please see the history of present illness.   All other systems are personally reviewed and negative.    Exam:    Vital Signs:  Ht '5\' 8"'$  (1.727 m)   BMI 25.73 kg/m           Wt Readings from Last 3 Encounters:  04/03/22 169 lb 3.2 oz (76.7 kg)  01/28/22 166 lb (75.3 kg)  01/08/22 169 lb 1.9 oz (76.7 kg)     Other studies personally reviewed: Additional studies/ records that were reviewed today include: (As above)   Review of the above records today demonstrates: (As above)     Last device remote is reviewed from [place] [facility] PDF dated 11/23 which reveals normal device function,   arrhythmias - paroxysmal afib ( June 2023   ASSESSMENT & PLAN:    High grade heart block   Sinus node function   PVC  Atrial fib on DOFETLIDE   CRT-P Medtronic    Hypertension   Atrial Tach   Cardiomyopathy non ischemic recEF     Continue dofetilide,  needs labs and will hope to get through PCP  [place]isode in [place] >>LH and SOB, not assoc with Afib,( per device) ? Heat  ? Exhaustion No recurrences   will follow    Follow-up: 6 month  Next remote:    Current medicines are reviewed at length with the patient today.   The patient does not have concerns regarding his medicines.  The following changes were made today:  will hold zetia and try lipitor ( has at home) 20 mg 2 x week   Labs/ tests  ordered today include:  I tried to reach out to PCP but not available on EPIC   he needs amio  labs  BMET and MG, so will need to come here ( or perhaps office in [place]-just comes to mind? No orders of the defined types were placed in this encounter.        Today, I have spent 17 minutes with the patient  with telehealth technology discussing the above.  Signed, Virl Axe, MD  04/08/2022 3:24 PM     [address] [place] [address] [phone] (office) [phone] (fax)

## 2022-04-09 DIAGNOSIS — R262 Difficulty in walking, not elsewhere classified: Secondary | ICD-10-CM | POA: Diagnosis not present

## 2022-04-09 DIAGNOSIS — G629 Polyneuropathy, unspecified: Secondary | ICD-10-CM | POA: Diagnosis not present

## 2022-04-09 DIAGNOSIS — M6281 Muscle weakness (generalized): Secondary | ICD-10-CM | POA: Diagnosis not present

## 2022-04-10 DIAGNOSIS — H5213 Myopia, bilateral: Secondary | ICD-10-CM | POA: Diagnosis not present

## 2022-04-10 DIAGNOSIS — H524 Presbyopia: Secondary | ICD-10-CM | POA: Diagnosis not present

## 2022-04-10 DIAGNOSIS — H52209 Unspecified astigmatism, unspecified eye: Secondary | ICD-10-CM | POA: Diagnosis not present

## 2022-04-12 ENCOUNTER — Ambulatory Visit: Payer: Medicare HMO | Attending: Internal Medicine

## 2022-04-12 DIAGNOSIS — Z950 Presence of cardiac pacemaker: Secondary | ICD-10-CM

## 2022-04-12 DIAGNOSIS — G4733 Obstructive sleep apnea (adult) (pediatric): Secondary | ICD-10-CM | POA: Diagnosis not present

## 2022-04-12 DIAGNOSIS — I442 Atrioventricular block, complete: Secondary | ICD-10-CM | POA: Diagnosis not present

## 2022-04-12 DIAGNOSIS — Z79899 Other long term (current) drug therapy: Secondary | ICD-10-CM | POA: Diagnosis not present

## 2022-04-12 DIAGNOSIS — I480 Paroxysmal atrial fibrillation: Secondary | ICD-10-CM | POA: Diagnosis not present

## 2022-04-12 DIAGNOSIS — I428 Other cardiomyopathies: Secondary | ICD-10-CM

## 2022-04-13 LAB — MAGNESIUM: Magnesium: 2.2 mg/dL (ref 1.6–2.3)

## 2022-04-13 LAB — BASIC METABOLIC PANEL WITH GFR
BUN/Creatinine Ratio: 22 (ref 10–24)
BUN: 20 mg/dL (ref 8–27)
CO2: 26 mmol/L (ref 20–29)
Calcium: 9.2 mg/dL (ref 8.6–10.2)
Chloride: 101 mmol/L (ref 96–106)
Creatinine, Ser: 0.92 mg/dL (ref 0.76–1.27)
Glucose: 98 mg/dL (ref 70–99)
Potassium: 4 mmol/L (ref 3.5–5.2)
Sodium: 142 mmol/L (ref 134–144)
eGFR: 90 mL/min/1.73 (ref >59)

## 2022-04-13 LAB — IMMUNOFIXATION ELECTROPHORESIS
IgG (Immunoglobin G), Serum: 1424 mg/dL (ref 600–1540)
IgM, Serum: 48 mg/dL — ABNORMAL LOW (ref 50–300)
Immunoglobulin A: 262 mg/dL (ref 70–320)

## 2022-04-13 LAB — VITAMIN B1: Vitamin B1 (Thiamine): 14 nmol/L (ref 8–30)

## 2022-04-15 DIAGNOSIS — G629 Polyneuropathy, unspecified: Secondary | ICD-10-CM | POA: Diagnosis not present

## 2022-04-15 DIAGNOSIS — M6281 Muscle weakness (generalized): Secondary | ICD-10-CM | POA: Diagnosis not present

## 2022-04-15 DIAGNOSIS — R262 Difficulty in walking, not elsewhere classified: Secondary | ICD-10-CM | POA: Diagnosis not present

## 2022-04-22 DIAGNOSIS — R262 Difficulty in walking, not elsewhere classified: Secondary | ICD-10-CM | POA: Diagnosis not present

## 2022-04-22 DIAGNOSIS — M6281 Muscle weakness (generalized): Secondary | ICD-10-CM | POA: Diagnosis not present

## 2022-04-22 DIAGNOSIS — G629 Polyneuropathy, unspecified: Secondary | ICD-10-CM | POA: Diagnosis not present

## 2022-04-24 ENCOUNTER — Ambulatory Visit (INDEPENDENT_AMBULATORY_CARE_PROVIDER_SITE_OTHER): Payer: Medicare HMO

## 2022-04-24 DIAGNOSIS — I442 Atrioventricular block, complete: Secondary | ICD-10-CM

## 2022-04-24 LAB — CUP PACEART REMOTE DEVICE CHECK
Battery Remaining Longevity: 33 mo
Battery Voltage: 2.91 V
Brady Statistic AP VP Percent: 31.1 %
Brady Statistic AP VS Percent: 0.12 %
Brady Statistic AS VP Percent: 68.02 %
Brady Statistic AS VS Percent: 0.76 %
Brady Statistic RA Percent Paced: 31.68 %
Brady Statistic RV Percent Paced: 99.11 %
Date Time Interrogation Session: 20240206202245
Implantable Lead Connection Status: 753985
Implantable Lead Connection Status: 753985
Implantable Lead Connection Status: 753985
Implantable Lead Implant Date: 20170802
Implantable Lead Implant Date: 20170802
Implantable Lead Implant Date: 20170802
Implantable Lead Location: 753858
Implantable Lead Location: 753859
Implantable Lead Location: 753860
Implantable Lead Model: 5076
Implantable Lead Model: 5076
Implantable Pulse Generator Implant Date: 20170802
Lead Channel Impedance Value: 1026 Ohm
Lead Channel Impedance Value: 1064 Ohm
Lead Channel Impedance Value: 342 Ohm
Lead Channel Impedance Value: 418 Ohm
Lead Channel Impedance Value: 437 Ohm
Lead Channel Impedance Value: 475 Ohm
Lead Channel Impedance Value: 494 Ohm
Lead Channel Impedance Value: 513 Ohm
Lead Channel Impedance Value: 532 Ohm
Lead Channel Impedance Value: 665 Ohm
Lead Channel Impedance Value: 741 Ohm
Lead Channel Impedance Value: 798 Ohm
Lead Channel Impedance Value: 912 Ohm
Lead Channel Impedance Value: 950 Ohm
Lead Channel Pacing Threshold Amplitude: 0.625 V
Lead Channel Pacing Threshold Amplitude: 1.125 V
Lead Channel Pacing Threshold Amplitude: 1.375 V
Lead Channel Pacing Threshold Pulse Width: 0.4 ms
Lead Channel Pacing Threshold Pulse Width: 0.4 ms
Lead Channel Pacing Threshold Pulse Width: 0.8 ms
Lead Channel Sensing Intrinsic Amplitude: 15.25 mV
Lead Channel Sensing Intrinsic Amplitude: 15.25 mV
Lead Channel Sensing Intrinsic Amplitude: 2 mV
Lead Channel Sensing Intrinsic Amplitude: 2 mV
Lead Channel Setting Pacing Amplitude: 1.5 V
Lead Channel Setting Pacing Amplitude: 2 V
Lead Channel Setting Pacing Amplitude: 2.5 V
Lead Channel Setting Pacing Pulse Width: 0.4 ms
Lead Channel Setting Pacing Pulse Width: 0.8 ms
Lead Channel Setting Sensing Sensitivity: 2.8 mV
Zone Setting Status: 755011
Zone Setting Status: 755011

## 2022-04-25 DIAGNOSIS — R262 Difficulty in walking, not elsewhere classified: Secondary | ICD-10-CM | POA: Diagnosis not present

## 2022-04-25 DIAGNOSIS — M6281 Muscle weakness (generalized): Secondary | ICD-10-CM | POA: Diagnosis not present

## 2022-04-25 DIAGNOSIS — G629 Polyneuropathy, unspecified: Secondary | ICD-10-CM | POA: Diagnosis not present

## 2022-04-29 DIAGNOSIS — M6281 Muscle weakness (generalized): Secondary | ICD-10-CM | POA: Diagnosis not present

## 2022-04-29 DIAGNOSIS — R262 Difficulty in walking, not elsewhere classified: Secondary | ICD-10-CM | POA: Diagnosis not present

## 2022-04-29 DIAGNOSIS — G629 Polyneuropathy, unspecified: Secondary | ICD-10-CM | POA: Diagnosis not present

## 2022-04-30 ENCOUNTER — Ambulatory Visit: Payer: Medicare HMO | Admitting: Physician Assistant

## 2022-04-30 ENCOUNTER — Encounter: Payer: Self-pay | Admitting: Family Medicine

## 2022-04-30 ENCOUNTER — Ambulatory Visit (INDEPENDENT_AMBULATORY_CARE_PROVIDER_SITE_OTHER): Payer: Medicare HMO | Admitting: Family Medicine

## 2022-04-30 VITALS — BP 122/77 | HR 80 | Resp 18 | Ht 68.5 in | Wt 166.0 lb

## 2022-04-30 DIAGNOSIS — F324 Major depressive disorder, single episode, in partial remission: Secondary | ICD-10-CM | POA: Diagnosis not present

## 2022-04-30 DIAGNOSIS — R202 Paresthesia of skin: Secondary | ICD-10-CM | POA: Insufficient documentation

## 2022-04-30 DIAGNOSIS — I10 Essential (primary) hypertension: Secondary | ICD-10-CM

## 2022-04-30 DIAGNOSIS — I2510 Atherosclerotic heart disease of native coronary artery without angina pectoris: Secondary | ICD-10-CM | POA: Diagnosis not present

## 2022-04-30 DIAGNOSIS — F419 Anxiety disorder, unspecified: Secondary | ICD-10-CM | POA: Insufficient documentation

## 2022-04-30 DIAGNOSIS — R2681 Unsteadiness on feet: Secondary | ICD-10-CM | POA: Diagnosis not present

## 2022-04-30 DIAGNOSIS — G629 Polyneuropathy, unspecified: Secondary | ICD-10-CM | POA: Diagnosis not present

## 2022-04-30 DIAGNOSIS — D6869 Other thrombophilia: Secondary | ICD-10-CM | POA: Diagnosis not present

## 2022-04-30 DIAGNOSIS — E538 Deficiency of other specified B group vitamins: Secondary | ICD-10-CM | POA: Diagnosis not present

## 2022-04-30 DIAGNOSIS — R69 Illness, unspecified: Secondary | ICD-10-CM | POA: Diagnosis not present

## 2022-04-30 DIAGNOSIS — N529 Male erectile dysfunction, unspecified: Secondary | ICD-10-CM | POA: Diagnosis not present

## 2022-04-30 DIAGNOSIS — E559 Vitamin D deficiency, unspecified: Secondary | ICD-10-CM

## 2022-04-30 DIAGNOSIS — G4733 Obstructive sleep apnea (adult) (pediatric): Secondary | ICD-10-CM

## 2022-04-30 DIAGNOSIS — E785 Hyperlipidemia, unspecified: Secondary | ICD-10-CM | POA: Diagnosis not present

## 2022-04-30 DIAGNOSIS — R609 Edema, unspecified: Secondary | ICD-10-CM | POA: Diagnosis not present

## 2022-04-30 DIAGNOSIS — J302 Other seasonal allergic rhinitis: Secondary | ICD-10-CM | POA: Diagnosis not present

## 2022-04-30 DIAGNOSIS — I4891 Unspecified atrial fibrillation: Secondary | ICD-10-CM | POA: Diagnosis not present

## 2022-04-30 NOTE — Assessment & Plan Note (Addendum)
Continue regular use of CPAP machine.

## 2022-04-30 NOTE — Assessment & Plan Note (Addendum)
Continue atorvastatin 81m daily. Discussed heart healthy diet.  We will get lipid panel as part of fasting blood work at next appointment

## 2022-04-30 NOTE — Assessment & Plan Note (Signed)
Discussed with patient and wife referral to PT. Both plan to start silver sneakers program at their local gym in [place]. Discussed bilateral cerumen impaction possibly also contributing to unsteady gait. Provided ENT referral information.

## 2022-04-30 NOTE — Assessment & Plan Note (Addendum)
Patient saw neurology 02/04/2022 for EMG which showed a mild axonal large fiber polyneuropathy, severe right carpal tunnel, and residuals of an old right C8 radiculopathy.  Neuropathy recommended PT for imbalance, increase Cymbalta to 40 mg daily.  They also recommended lidocaine cream as needed as well as cock-up wrist splints for carpal tunnel syndrome.  Follow-up with neurology every 6 months.  Continue Cymbalta 40 mg daily as instructed as well as PT.  Continue follow-up with neurology.

## 2022-04-30 NOTE — Progress Notes (Signed)
Established Patient Office Visit  Subjective   Patient ID: George [name], male    DOB: [birth year]  Age: 72 y.o. MRN: [id number]  Chief Complaint  Patient presents with   Follow-up   mood   Hyperlipidemia   Hypertension    HPI HTN: Pt denies chest pain, SOB, dizziness, or heart palpitations.  Taking meds as directed w/o problems.  Denies carvedilol, Lasix, losartan side effects.     Hyperlipidemia: tolerating statin well with no myalgias or significant side effects. Cardiology switched medication to atorvastatin 91m which is going well.  OSA: Patient states he is using his CPAP machine every night, with that he is able to actually sleep through the night uninterrupted.  Previously he was waking up periodically, but using the CPAP in combination with the tamsulosin from urology that prevents him from having to get up to go to the bathroom he is able to get adequate uninterrupted sleep.  Anxiety, paresthesias: He previously started Cymbalta 20 mg with primary care hoping that it would help with his anxiety as well as his paresthesias bilaterally down his legs and into his feet.  He was able to see neurology in November, and they increase Cymbalta to 40 mg.  He states he is tolerating it well and it is helping immensely with the paresthesias as well as his mood.  BMP drawn in January within normal limits.   Review of Systems  Eyes:  Negative for blurred vision and double vision.  Respiratory:  Negative for shortness of breath.   Cardiovascular:  Negative for chest pain, palpitations and leg swelling.  Neurological:  Negative for dizziness and headaches.  Psychiatric/Behavioral:  Negative for depression. The patient is not nervous/anxious.    Objective:     BP 122/77 (BP Location: Left Arm, Patient Position: Sitting, Cuff Size: Normal)   Pulse 80   Resp 18   Ht 5' 8.5" (1.74 m)   Wt 166 lb (75.3 kg)   SpO2 100%   BMI 24.87 kg/m   Physical Exam Constitutional:       General: He is not in acute distress.    Appearance: Normal appearance.  HENT:     Head: Normocephalic and atraumatic.  Cardiovascular:     Rate and Rhythm: Normal rate and regular rhythm.     Pulses: Normal pulses.     Heart sounds: Normal heart sounds.  Pulmonary:     Effort: Pulmonary effort is normal.     Breath sounds: Normal breath sounds.  Skin:    General: Skin is warm and dry.  Neurological:     Mental Status: He is alert and oriented to person, place, and time.  Psychiatric:        Mood and Affect: Mood normal.     The 10-year ASCVD risk score (Arnett DK, et al., 2019) is: 16.1%    Assessment & Plan:   Problem List Items Addressed This Visit       Cardiovascular and Mediastinum   Essential hypertension - Primary (Chronic)    Stable, blood pressure looked great today. Continue current medication regimen. Will continue to monitor.       Relevant Orders   Comprehensive metabolic panel   CBC     Respiratory   OSA (obstructive sleep apnea)    Continue regular use of CPAP machine.         Other   Vitamin B12 deficiency (Chronic)   Relevant Orders   B12 and Folate Panel   Vitamin D insufficiency (  Chronic)   Relevant Orders   VITAMIN D 25 Hydroxy (Vit-D Deficiency, Fractures)   Hyperlipidemia LDL goal <100    Continue atorvastatin 34m daily. Discussed heart healthy diet.  We will get lipid panel as part of fasting blood work at next appointment      Relevant Orders   Lipid panel   Anxiety    Continue duloxetine 40 mg daily for mood and paresthesias.  Will reassess mood at next appointment to ensure duloxetine is continuing to help with anxiety in addition to the paresthesias.      Paresthesia    Patient saw neurology 02/04/2022 for EMG which showed a mild axonal large fiber polyneuropathy, severe right carpal tunnel, and residuals of an old right C8 radiculopathy.  Neuropathy recommended PT for imbalance, increase Cymbalta to 40 mg daily.  They also  recommended lidocaine cream as needed as well as cock-up wrist splints for carpal tunnel syndrome.  Follow-up with neurology every 6 months.  Continue Cymbalta 40 mg daily as instructed as well as PT.  Continue follow-up with neurology.       Return in about 3 months (around 07/29/2022) for Annual physical and fasting bloodwork.    MVelva Harman PA

## 2022-04-30 NOTE — Assessment & Plan Note (Addendum)
Stable, blood pressure looked great today. Continue current medication regimen. Will continue to monitor.

## 2022-04-30 NOTE — Patient Instructions (Signed)
Follow up in 3 months for annual wellness visit. We will get fasting blood work that day as well. Otherwise keep up the good work and let us know if you need anything

## 2022-04-30 NOTE — Assessment & Plan Note (Addendum)
Continue duloxetine 40 mg daily for mood and paresthesias.  Will reassess mood at next appointment to ensure duloxetine is continuing to help with anxiety in addition to the paresthesias.

## 2022-05-02 DIAGNOSIS — R262 Difficulty in walking, not elsewhere classified: Secondary | ICD-10-CM | POA: Diagnosis not present

## 2022-05-02 DIAGNOSIS — M6281 Muscle weakness (generalized): Secondary | ICD-10-CM | POA: Diagnosis not present

## 2022-05-02 DIAGNOSIS — G629 Polyneuropathy, unspecified: Secondary | ICD-10-CM | POA: Diagnosis not present

## 2022-05-06 ENCOUNTER — Other Ambulatory Visit: Payer: Self-pay | Admitting: Physician Assistant

## 2022-05-06 DIAGNOSIS — M6281 Muscle weakness (generalized): Secondary | ICD-10-CM | POA: Diagnosis not present

## 2022-05-06 DIAGNOSIS — G629 Polyneuropathy, unspecified: Secondary | ICD-10-CM | POA: Diagnosis not present

## 2022-05-06 DIAGNOSIS — R262 Difficulty in walking, not elsewhere classified: Secondary | ICD-10-CM | POA: Diagnosis not present

## 2022-05-06 NOTE — Telephone Encounter (Signed)
Pt's medication was sent to pt's pharmacy as requested. Confirmation received.  °

## 2022-05-09 DIAGNOSIS — M6281 Muscle weakness (generalized): Secondary | ICD-10-CM | POA: Diagnosis not present

## 2022-05-09 DIAGNOSIS — R262 Difficulty in walking, not elsewhere classified: Secondary | ICD-10-CM | POA: Diagnosis not present

## 2022-05-09 DIAGNOSIS — G629 Polyneuropathy, unspecified: Secondary | ICD-10-CM | POA: Diagnosis not present

## 2022-05-13 DIAGNOSIS — G4733 Obstructive sleep apnea (adult) (pediatric): Secondary | ICD-10-CM | POA: Diagnosis not present

## 2022-05-13 DIAGNOSIS — G629 Polyneuropathy, unspecified: Secondary | ICD-10-CM | POA: Diagnosis not present

## 2022-05-13 DIAGNOSIS — R262 Difficulty in walking, not elsewhere classified: Secondary | ICD-10-CM | POA: Diagnosis not present

## 2022-05-13 DIAGNOSIS — M6281 Muscle weakness (generalized): Secondary | ICD-10-CM | POA: Diagnosis not present

## 2022-05-20 NOTE — Progress Notes (Signed)
Remote pacemaker transmission.   

## 2022-05-21 DIAGNOSIS — R262 Difficulty in walking, not elsewhere classified: Secondary | ICD-10-CM | POA: Diagnosis not present

## 2022-05-21 DIAGNOSIS — G629 Polyneuropathy, unspecified: Secondary | ICD-10-CM | POA: Diagnosis not present

## 2022-05-21 DIAGNOSIS — M6281 Muscle weakness (generalized): Secondary | ICD-10-CM | POA: Diagnosis not present

## 2022-05-22 DIAGNOSIS — B078 Other viral warts: Secondary | ICD-10-CM | POA: Diagnosis not present

## 2022-05-22 DIAGNOSIS — X32XXXD Exposure to sunlight, subsequent encounter: Secondary | ICD-10-CM | POA: Diagnosis not present

## 2022-05-22 DIAGNOSIS — D225 Melanocytic nevi of trunk: Secondary | ICD-10-CM | POA: Diagnosis not present

## 2022-05-22 DIAGNOSIS — L820 Inflamed seborrheic keratosis: Secondary | ICD-10-CM | POA: Diagnosis not present

## 2022-05-22 DIAGNOSIS — L570 Actinic keratosis: Secondary | ICD-10-CM | POA: Diagnosis not present

## 2022-06-18 ENCOUNTER — Other Ambulatory Visit: Payer: Self-pay | Admitting: Internal Medicine

## 2022-06-22 ENCOUNTER — Other Ambulatory Visit: Payer: Self-pay | Admitting: Physician Assistant

## 2022-06-29 ENCOUNTER — Other Ambulatory Visit: Payer: Self-pay | Admitting: Physician Assistant

## 2022-07-03 ENCOUNTER — Telehealth: Payer: Self-pay

## 2022-07-03 NOTE — Telephone Encounter (Signed)
Called patient to schedule Medicare Annual Wellness Visit (AWV). Unable to reach patient.  Last date of AWV: 12/13/20  Please schedule an appointment at any time on Annual Wellness Schedule.

## 2022-07-16 DIAGNOSIS — G4733 Obstructive sleep apnea (adult) (pediatric): Secondary | ICD-10-CM | POA: Diagnosis not present

## 2022-07-18 ENCOUNTER — Other Ambulatory Visit: Payer: Medicare HMO

## 2022-07-18 DIAGNOSIS — E559 Vitamin D deficiency, unspecified: Secondary | ICD-10-CM

## 2022-07-18 DIAGNOSIS — E785 Hyperlipidemia, unspecified: Secondary | ICD-10-CM | POA: Diagnosis not present

## 2022-07-18 DIAGNOSIS — E538 Deficiency of other specified B group vitamins: Secondary | ICD-10-CM

## 2022-07-18 DIAGNOSIS — I10 Essential (primary) hypertension: Secondary | ICD-10-CM | POA: Diagnosis not present

## 2022-07-19 LAB — COMPREHENSIVE METABOLIC PANEL
ALT: 20 IU/L (ref 0–44)
AST: 23 IU/L (ref 0–40)
Albumin/Globulin Ratio: 1.7 (ref 1.2–2.2)
Albumin: 4.3 g/dL (ref 3.9–4.9)
Alkaline Phosphatase: 85 IU/L (ref 44–121)
BUN/Creatinine Ratio: 16 (ref 10–24)
BUN: 16 mg/dL (ref 8–27)
Bilirubin Total: 1.5 mg/dL — ABNORMAL HIGH (ref 0.0–1.2)
CO2: 25 mmol/L (ref 20–29)
Calcium: 9.2 mg/dL (ref 8.6–10.2)
Chloride: 103 mmol/L (ref 96–106)
Creatinine, Ser: 0.98 mg/dL (ref 0.76–1.27)
Globulin, Total: 2.6 g/dL (ref 1.5–4.5)
Glucose: 101 mg/dL — ABNORMAL HIGH (ref 70–99)
Potassium: 4.5 mmol/L (ref 3.5–5.2)
Sodium: 139 mmol/L (ref 134–144)
Total Protein: 6.9 g/dL (ref 6.0–8.5)
eGFR: 83 mL/min/{1.73_m2} (ref >59)

## 2022-07-19 LAB — CBC
Hematocrit: 40.3 % (ref 37.5–51.0)
Hemoglobin: 13.5 g/dL (ref 13.0–17.7)
MCH: 30.7 pg (ref 26.6–33.0)
MCHC: 33.5 g/dL (ref 31.5–35.7)
MCV: 92 fL (ref 79–97)
Platelets: 191 10*3/uL (ref 150–450)
RBC: 4.4 x10E6/uL (ref 4.14–5.80)
RDW: 12.4 % (ref 11.6–15.4)
WBC: 5.6 10*3/uL (ref 3.4–10.8)

## 2022-07-19 LAB — LIPID PANEL
Chol/HDL Ratio: 3.2 ratio (ref 0.0–5.0)
Cholesterol, Total: 157 mg/dL (ref 100–199)
HDL: 49 mg/dL (ref >39)
LDL Chol Calc (NIH): 91 mg/dL (ref 0–99)
Triglycerides: 90 mg/dL (ref 0–149)
VLDL Cholesterol Cal: 17 mg/dL (ref 5–40)

## 2022-07-19 LAB — B12 AND FOLATE PANEL
Folate: 14.7 ng/mL (ref >3.0)
Vitamin B-12: 330 pg/mL (ref 232–1245)

## 2022-07-19 LAB — VITAMIN D 25 HYDROXY (VIT D DEFICIENCY, FRACTURES): Vit D, 25-Hydroxy: 35.3 ng/mL (ref 30.0–100.0)

## 2022-07-23 ENCOUNTER — Other Ambulatory Visit: Payer: Medicare HMO

## 2022-07-24 ENCOUNTER — Ambulatory Visit (INDEPENDENT_AMBULATORY_CARE_PROVIDER_SITE_OTHER): Payer: Medicare HMO

## 2022-07-24 DIAGNOSIS — I428 Other cardiomyopathies: Secondary | ICD-10-CM | POA: Diagnosis not present

## 2022-07-24 LAB — CUP PACEART REMOTE DEVICE CHECK
Battery Remaining Longevity: 30 mo
Battery Voltage: 2.9 V
Brady Statistic AP VP Percent: 28.48 %
Brady Statistic AP VS Percent: 0.14 %
Brady Statistic AS VP Percent: 70.19 %
Brady Statistic AS VS Percent: 1.19 %
Brady Statistic RA Percent Paced: 29.26 %
Brady Statistic RV Percent Paced: 98.67 %
Date Time Interrogation Session: 20240507200842
Implantable Lead Connection Status: 753985
Implantable Lead Connection Status: 753985
Implantable Lead Connection Status: 753985
Implantable Lead Implant Date: 20170802
Implantable Lead Implant Date: 20170802
Implantable Lead Implant Date: 20170802
Implantable Lead Location: 753858
Implantable Lead Location: 753859
Implantable Lead Location: 753860
Implantable Lead Model: 5076
Implantable Lead Model: 5076
Implantable Pulse Generator Implant Date: 20170802
Lead Channel Impedance Value: 1007 Ohm
Lead Channel Impedance Value: 1064 Ohm
Lead Channel Impedance Value: 1140 Ohm
Lead Channel Impedance Value: 342 Ohm
Lead Channel Impedance Value: 418 Ohm
Lead Channel Impedance Value: 456 Ohm
Lead Channel Impedance Value: 475 Ohm
Lead Channel Impedance Value: 494 Ohm
Lead Channel Impedance Value: 532 Ohm
Lead Channel Impedance Value: 570 Ohm
Lead Channel Impedance Value: 703 Ohm
Lead Channel Impedance Value: 779 Ohm
Lead Channel Impedance Value: 855 Ohm
Lead Channel Impedance Value: 988 Ohm
Lead Channel Pacing Threshold Amplitude: 0.625 V
Lead Channel Pacing Threshold Amplitude: 1.25 V
Lead Channel Pacing Threshold Amplitude: 1.25 V
Lead Channel Pacing Threshold Pulse Width: 0.4 ms
Lead Channel Pacing Threshold Pulse Width: 0.4 ms
Lead Channel Pacing Threshold Pulse Width: 0.8 ms
Lead Channel Sensing Intrinsic Amplitude: 15.25 mV
Lead Channel Sensing Intrinsic Amplitude: 15.25 mV
Lead Channel Sensing Intrinsic Amplitude: 2.375 mV
Lead Channel Sensing Intrinsic Amplitude: 2.375 mV
Lead Channel Setting Pacing Amplitude: 1.5 V
Lead Channel Setting Pacing Amplitude: 1.75 V
Lead Channel Setting Pacing Amplitude: 2.5 V
Lead Channel Setting Pacing Pulse Width: 0.4 ms
Lead Channel Setting Pacing Pulse Width: 0.8 ms
Lead Channel Setting Sensing Sensitivity: 2.8 mV
Zone Setting Status: 755011
Zone Setting Status: 755011

## 2022-07-30 ENCOUNTER — Encounter: Payer: Medicare HMO | Admitting: Family Medicine

## 2022-08-01 ENCOUNTER — Ambulatory Visit (INDEPENDENT_AMBULATORY_CARE_PROVIDER_SITE_OTHER): Payer: Medicare HMO | Admitting: Family Medicine

## 2022-08-01 ENCOUNTER — Encounter: Payer: Self-pay | Admitting: Family Medicine

## 2022-08-01 VITALS — BP 115/73 | HR 71 | Resp 18 | Ht 68.5 in | Wt 164.0 lb

## 2022-08-01 DIAGNOSIS — I10 Essential (primary) hypertension: Secondary | ICD-10-CM | POA: Diagnosis not present

## 2022-08-01 DIAGNOSIS — G4733 Obstructive sleep apnea (adult) (pediatric): Secondary | ICD-10-CM

## 2022-08-01 DIAGNOSIS — E785 Hyperlipidemia, unspecified: Secondary | ICD-10-CM

## 2022-08-01 DIAGNOSIS — F419 Anxiety disorder, unspecified: Secondary | ICD-10-CM | POA: Diagnosis not present

## 2022-08-01 DIAGNOSIS — Z0000 Encounter for general adult medical examination without abnormal findings: Secondary | ICD-10-CM | POA: Diagnosis not present

## 2022-08-01 MED ORDER — CLOTRIMAZOLE-BETAMETHASONE 1-0.05 % EX CREA: 1.0000 | TOPICAL_CREAM | CUTANEOUS | 11 refills | Status: DC | PRN

## 2022-08-01 NOTE — Assessment & Plan Note (Signed)
Total bilirubin 1.5 on most recent check.  Patient is s/p cholecystectomy as of 2009.

## 2022-08-01 NOTE — Assessment & Plan Note (Signed)
Stable.  Continue regular use of CPAP machine.

## 2022-08-01 NOTE — Assessment & Plan Note (Signed)
Stable, blood pressure at goal <130/80.  Continue carvedilol 25 mg twice daily, Lasix 20 mg daily, losartan 12.5 mg daily, spironolactone 25 mg daily.  Most recent CMP essentially within normal limits, total bilirubin slightly elevated consistent with hereditary hyperbilirubinemia.  Will continue to monitor.

## 2022-08-01 NOTE — Assessment & Plan Note (Signed)
Most recent lipid panel: LDL 91, HDL 49, triglycerides 90.  Continue atorvastatin 20 mg daily.  Will continue to monitor.

## 2022-08-01 NOTE — Assessment & Plan Note (Signed)
Stable, GAD-7 score of 0.  Continue Cymbalta 40 mg daily.  Will continue to monitor.

## 2022-08-01 NOTE — Progress Notes (Signed)
Complete physical exam  Patient: George [name]   DOB: July 15, 1950   72 y.o. Male  MRN: [id number]  Subjective:    Chief Complaint  Patient presents with   Annual Exam    George [name] is a 72 y.o. male who presents today for a complete physical exam. He reports consuming a general diet. Home exercise routine includes completing exercises that he learned at PT.  He is also planning on joining a gym through the [employer] program with his wife. He generally feels well. He reports sleeping well. He does not have additional problems to discuss today.    Most recent fall risk assessment:    04/30/2022    8:38 AM  Fall Risk   Falls in the past year? 1  Number falls in past yr: 1  Injury with Fall? 0     Most recent depression and anxiety screenings:    08/01/2022    2:16 PM 04/30/2022    8:37 AM  PHQ 2/9 Scores  PHQ - 2 Score 0 0  PHQ- 9 Score 0 2      04/30/2022    8:37 AM 01/28/2022    1:52 PM 01/08/2022    3:17 PM 11/08/2021   10:03 AM  GAD 7 : Generalized Anxiety Score  Nervous, Anxious, on Edge 0 0 0 0  Control/stop worrying 0 0 0 0  Worry too much - different things 0 0 0 0  Trouble relaxing 0 0 0 0  Restless 0  0 0  Easily annoyed or irritable 0 1 0 0  Afraid - awful might happen 0 0 0 0  Total GAD 7 Score 0  0 0  Anxiety Difficulty Not difficult at all Somewhat difficult  Not difficult at all    Patient Active Problem List   Diagnosis Date Noted   Anxiety 04/30/2022   Paresthesia 04/30/2022   OSA (obstructive sleep apnea) 05/17/2021   Seasonal allergic rhinitis 08/01/2020   Persistent atrial fibrillation (HCC) 07/21/2020   Secondary hypercoagulable state (HCC) 07/21/2020   Atrial tachycardia 04/16/2020   Biventricular cardiac pacemaker in situ 04/16/2020   Mild cognitive impairment 04/03/2020   S/P arthroscopy of right shoulder 02/09/2019   VT (ventricular tachycardia) (HCC) 11/12/2018   Environmental and seasonal allergies 06/09/2018   Senile  solar keratosis 05/26/2018   Eczema of eyelid 05/26/2018   Inguinal hernia of right side without obstruction or gangrene 05/26/2018   CAD (coronary artery disease), native coronary artery 01/30/2018   Coronary artery aneurysm 01/30/2018   Other male erectile dysfunction- "hx of fractured penis requiring sx" 05/20/2017   Hyperlipidemia LDL goal <100 05/13/2017   BBBB (bilateral bundle branch block) 10/18/2015   Complete heart block (HCC) 10/18/2015   Vitamin B12 deficiency 10/06/2015   Vitamin D insufficiency 10/06/2015   Hereditary hyperbilirubinemia 10/06/2015   Peyronie's disease 09/19/2015   Essential hypertension 09/14/2015    Past Surgical History:  Procedure Laterality Date   CARDIOVASCULAR STRESS TEST  10-18-2002  DR  CRENSHAW   NORMAL ADENOSINE CARDIOLITE/ EF 60%   CARDIOVERSION N/A 08/18/2020   Procedure: CARDIOVERSION;  Surgeon: Sande Rives, MD;  [facility] ENDOSCOPY;  Service: Cardiovascular;  Laterality: N/A;   CARDIOVERSION N/A 11/15/2020   Procedure: CARDIOVERSION;  Surgeon: Quintella Reichert, MD;  Location: [facility] ENDOSCOPY;  Service: Cardiovascular;  Laterality: N/A;   CATARACT EXTRACTION W/ INTRAOCULAR LENS IMPLANT  2006   LEFT EYE   EP IMPLANTABLE DEVICE N/A 10/18/2015   Procedure: BiV Pacemaker  Insertion CRT-P;  Surgeon: Duke Salvia, MD;  Location[facility] INVASIVE CV LAB;  Service: Cardiovascular;  Laterality: N/A;   INGUINAL HERNIA REPAIR Right 02/23/2019   Procedure: RIGHT INGUINAL HERNIA REPAIR WITH MESH;  Surgeon: Harriette Bouillon, MD;  Location: [facility];  Service: General;  Laterality: Right;   LAPAROSCOPIC CHOLECYSTECTOMY  10-09-2007   LEFT HEART CATH AND CORONARY ANGIOGRAPHY N/A 01/30/2018   Procedure: LEFT HEART CATH AND CORONARY ANGIOGRAPHY;  Surgeon: Lyn Records, MD;  Location: [facility] INVASIVE CV LAB;  Service: Cardiovascular;  Laterality: N/A;   NESBIT PROCEDURE  01/17/2012   Procedure: NESBIT PROCEDURE;  Surgeon: Garnett Farm, MD;   Location: [facility];  Service: Urology;  Laterality: N/A;  16 dot plication    REPAIR DETACHED RETINA, LEFT EYE  1993   REPAIR LEFT INGUINAL HERNIA  2005   Social History   Tobacco Use   Smoking status: Never    Passive exposure: Never   Smokeless tobacco: Never  Vaping Use   Vaping Use: Never used  Substance Use Topics   Alcohol use: No   Drug use: No   Family History  Problem Relation Age of Onset   Diabetes Mother    Hypertension Mother    Hyperlipidemia Mother    Heart disease Father    Hyperlipidemia Father    Hypertension Father    Arrhythmia Father        paf   Healthy Daughter    Healthy Son    Heart disease Paternal Grandfather    Heart attack Paternal Grandfather    No Known Allergies   Patient Care Team: Melida Quitter, [place] as PCP - General (Family Medicine) Duke Salvia, MD as PCP - Cardiology (Cardiology) Duke Salvia, MD as PCP - Electrophysiology (Cardiology) Graylin Shiver, MD as Consulting Physician (Gastroenterology) Nita Sells, MD as Consulting Physician (Dermatology) Pa, [facility] as Consulting Physician (Optometry) Duke Salvia, MD as Consulting Physician (Cardiology) Van Clines, MD as Consulting Physician (Neurology)   Outpatient Medications Prior to Visit  Medication Sig   atorvastatin (LIPITOR) 20 MG tablet Take 1 tablet by mouth 2 times per week.   carvedilol (COREG) 25 MG tablet Take 1 tablet (25 mg total) by mouth 2 (two) times daily with a meal.   Cholecalciferol (VITAMIN D3) 5000 units TABS 5,000 IU OTC vitamin D3 daily.   dofetilide (TIKOSYN) 500 MCG capsule TAKE ONE CAPSULE BY MOUTH TWICE A DAY   DULoxetine (CYMBALTA) 20 MG capsule Take 2 capsules (40 mg total) by mouth at bedtime.   ELIQUIS 5 MG TABS tablet TAKE 1 TABLET BY MOUTH TWICE A DAY   fexofenadine (ALLEGRA) 180 MG tablet TAKE 1 TABLET (180 MG TOTAL) BY MOUTH DAILY. DURING ALLERGY SEASON   fluticasone (FLONASE) 50 MCG/ACT nasal spray  USE 1 SPRAY IN EACH NOSTRIL AFTER SINUS RINSES TWICE DAILY. (Patient taking differently: prn)   furosemide (LASIX) 20 MG tablet TAKE 1 TABLET BY MOUTH EVERY DAY   hydrocortisone (ANUSOL-HC) 2.5 % rectal cream Place 1 Application rectally 2 (two) times daily.   losartan (COZAAR) 25 MG tablet TAKE 1/2 TABLET BY MOUTH EVERY DAY   Multiple Vitamins-Minerals (CENTRUM ADULTS) TABS Take 1 tablet by mouth daily.   spironolactone (ALDACTONE) 25 MG tablet TAKE 1 TABLET (25 MG TOTAL) BY MOUTH DAILY.   tamsulosin (FLOMAX) 0.4 MG CAPS capsule Take 0.4 mg by mouth daily.   [DISCONTINUED] clotrimazole-betamethasone (LOTRISONE) cream Apply 1 application topically as needed (  for skin).   No facility-administered medications prior to visit.    Review of Systems  Constitutional:  Negative for chills, fever and malaise/fatigue.  HENT:  Negative for congestion and hearing loss.   Eyes:  Negative for blurred vision and double vision.  Respiratory:  Negative for cough and shortness of breath.   Cardiovascular:  Negative for chest pain, palpitations and leg swelling.  Gastrointestinal:  Negative for abdominal pain, constipation, diarrhea and heartburn.  Genitourinary:  Negative for frequency and urgency.  Musculoskeletal:  Negative for myalgias and neck pain.  Neurological:  Negative for headaches.  Endo/Heme/Allergies:  Negative for polydipsia.  Psychiatric/Behavioral:  Negative for depression. The patient is not nervous/anxious and does not have insomnia.        Objective:    BP 115/73 (BP Location: Left Arm, Patient Position: Sitting, Cuff Size: Normal)   Pulse 71   Resp 18   Ht 5' 8.5" (1.74 m)   Wt 164 lb (74.4 kg)   SpO2 100%   BMI 24.57 kg/m    Physical Exam Constitutional:      General: He is not in acute distress.    Appearance: Normal appearance.  HENT:     Head: Normocephalic and atraumatic.     Right Ear: Tympanic membrane, ear canal and external ear normal.     Left Ear: Tympanic  membrane, ear canal and external ear normal.     Ears:     Comments: Cerumen present bilaterally    Nose: Nose normal. No congestion or rhinorrhea.     Mouth/Throat:     Mouth: Mucous membranes are moist.     Pharynx: Oropharynx is clear. No oropharyngeal exudate or posterior oropharyngeal erythema.  Eyes:     Extraocular Movements: Extraocular movements intact.     Conjunctiva/sclera: Conjunctivae normal.     Pupils: Pupils are equal, round, and reactive to light.     Comments: Wears glasses, prescription up-to-date  Neck:     Thyroid: No thyroid mass, thyromegaly or thyroid tenderness.  Cardiovascular:     Rate and Rhythm: Normal rate and regular rhythm.     Pulses: Normal pulses.     Heart sounds: Normal heart sounds. No murmur heard.    No friction rub. No gallop.  Pulmonary:     Effort: Pulmonary effort is normal.     Breath sounds: Normal breath sounds. No wheezing, rhonchi or rales.  Abdominal:     General: Bowel sounds are normal.     Palpations: Abdomen is soft.  Musculoskeletal:        General: Normal range of motion.     Cervical back: Normal range of motion and neck supple.  Lymphadenopathy:     Cervical: No cervical adenopathy.  Skin:    General: Skin is warm and dry.  Neurological:     Mental Status: He is alert and oriented to person, place, and time.  Psychiatric:        Mood and Affect: Mood normal.       Assessment & Plan:    Routine Health Maintenance and Physical Exam  Immunization History  Administered Date(s) Administered   PFIZER(Purple Top)SARS-COV-2 Vaccination 05/11/2019, 06/01/2019, 12/28/2019   Td 03/18/2006   Tdap 11/13/2016    Health Maintenance  Topic Date Due   COVID-19 Vaccine (4 - 2023-24 season) 11/16/2021   Medicare Annual Wellness (AWV)  12/13/2021   COLON CANCER SCREENING ANNUAL FOBT  05/03/2022   Pneumonia Vaccine 29+ Years old (1 of 1 - PCV) 08/14/2028 (  Originally 02/08/2018)   Hepatitis C Screening  11/11/2028 (Originally  02/09/1971)   Zoster Vaccines- Shingrix (1 of 2) 11/14/2028 (Originally 02/09/1972)   INFLUENZA VACCINE  10/17/2022   COLONOSCOPY (Pts 45-74yrs Insurance coverage will need to be confirmed)  02/04/2026   DTaP/Tdap/Td (3 - Td or Tdap) 11/14/2026   HPV VACCINES  Aged Out   We discussed the recommendations for pneumonia and shingles vaccines.  Patient states that he is not interested in them.  I provided printed information for him to read more about them when he gets home. Patient has upcoming colonoscopy scheduled with [facility] gastroenterology in the next 6 weeks.  Discussed health benefits of physical activity, and encouraged him to engage in regular exercise appropriate for his age and condition.  Wellness examination  Essential hypertension Assessment & Plan: Stable, blood pressure at goal <130/80.  Continue carvedilol 25 mg twice daily, Lasix 20 mg daily, losartan 12.5 mg daily, spironolactone 25 mg daily.  Most recent CMP essentially within normal limits, total bilirubin slightly elevated consistent with hereditary hyperbilirubinemia.  Will continue to monitor.   Hyperlipidemia LDL goal <100 Assessment & Plan: Most recent lipid panel: LDL 91, HDL 49, triglycerides 90.  Continue atorvastatin 20 mg daily.  Will continue to monitor.   Hereditary hyperbilirubinemia Assessment & Plan: Total bilirubin 1.5 on most recent check.  Patient is s/p cholecystectomy as of 2009.   OSA (obstructive sleep apnea) Assessment & Plan: Stable.  Continue regular use of CPAP machine.    Anxiety Assessment & Plan: Stable, GAD-7 score of 0.  Continue Cymbalta 40 mg daily.  Will continue to monitor.   Other orders -     Clotrimazole-Betamethasone; Apply 1 Application topically as needed (for skin).  Dispense: 30 g; Refill: 11  Provided refill of Lotrisone cream at [facility] as needed, he finds that he has flares more often during summer months when he sweats a lot.  Otherwise he did not need any  refills today.  Return in about 4 months (around 12/02/2022) for follow-up for HTN, HLD, OSA, mood, paresthesia.  Also due for AWV.     Melida Quitter, PA

## 2022-08-01 NOTE — Patient Instructions (Signed)
Keep up the good work!  I hope that you are able to join a gym and get to enjoy going.

## 2022-08-14 NOTE — Progress Notes (Signed)
Remote pacemaker transmission.   

## 2022-08-15 DIAGNOSIS — G4733 Obstructive sleep apnea (adult) (pediatric): Secondary | ICD-10-CM | POA: Diagnosis not present

## 2022-08-19 DIAGNOSIS — Z8679 Personal history of other diseases of the circulatory system: Secondary | ICD-10-CM | POA: Diagnosis not present

## 2022-08-19 DIAGNOSIS — Z86010 Personal history of colonic polyps: Secondary | ICD-10-CM | POA: Diagnosis not present

## 2022-08-19 DIAGNOSIS — Z7901 Long term (current) use of anticoagulants: Secondary | ICD-10-CM | POA: Diagnosis not present

## 2022-08-21 ENCOUNTER — Telehealth: Payer: Self-pay | Admitting: *Deleted

## 2022-08-21 NOTE — Telephone Encounter (Signed)
   Pre-operative Risk Assessment    Patient Name: CAMDIN GOODRUM  DOB: 25-Aug-1950 MRN: [id number]      Request for Surgical Clearance    Procedure:   COLONOSCOPY 3 Date of Surgery:  Clearance 09/11/22                                 Surgeon:  DR. Levora Angel Surgeon's Group or Practice Name:  [facility] Phone number:  [phone] Fax number:  [phone]   Type of Clearance Requested:   - Medical  - Pharmacy:  Hold Apixaban (Eliquis) X'S 2 DAYS   Type of Anesthesia:   PROPOFOL   Additional requests/questions:    Wilhemina Cash   08/21/2022, 7:37 AM

## 2022-08-21 NOTE — Telephone Encounter (Signed)
Pt has been scheduled for tele pre op appt 09/02/22 @ 10:20. Med rec and consent are done.

## 2022-08-21 NOTE — Telephone Encounter (Signed)
Patient with diagnosis of afib on Eliquis  for anticoagulation.    Procedure: colonoscopy  Date of procedure: 09/11/22    CHA2DS2-VASc Score = 3   This indicates a 3.2% annual risk of stroke. The patient's score is based upon: CHF History: 0 HTN History: 1 Diabetes History: 0 Stroke History: 0 Vascular Disease History: 1 Age Score: 1 Gender Score: 0    CrCl 69 mL/min Platelet count 191 K (07/18/2022)   Per office protocol, patient can hold Eliquis for 2 days prior to procedure.     **This guidance is not considered finalized until pre-operative APP has relayed final recommendations.**

## 2022-08-21 NOTE — Telephone Encounter (Signed)
Please advise holding Eliquis prior to colonoscopy.  Thank you!  DW  

## 2022-08-21 NOTE — Telephone Encounter (Signed)
Pt has been scheduled for tele pre op appt 09/02/22 @ 10:20. Med rec and consent are done.      Patient Consent for Virtual Visit        George [name] has provided verbal consent on 08/21/2022 for a virtual visit (video or telephone).   CONSENT FOR VIRTUAL VISIT FOR:  George [name]  By participating in this virtual visit I agree to the following:  I hereby voluntarily request, consent and authorize [facility] and its employed or contracted physicians, physician assistants, nurse practitioners or other licensed health care professionals (the Practitioner), to provide me with telemedicine health care services (the "Services") as deemed necessary by the treating Practitioner. I acknowledge and consent to receive the Services by the Practitioner via telemedicine. I understand that the telemedicine visit will involve communicating with the Practitioner through live audiovisual communication technology and the disclosure of certain medical information by electronic transmission. I acknowledge that I have been given the opportunity to request an in-person assessment or other available alternative prior to the telemedicine visit and am voluntarily participating in the telemedicine visit.  I understand that I have the right to withhold or withdraw my consent to the use of telemedicine in the course of my care at any time, without affecting my right to future care or treatment, and that the Practitioner or I may terminate the telemedicine visit at any time. I understand that I have the right to inspect all information obtained and/or recorded in the course of the telemedicine visit and may receive copies of available information for a reasonable fee.  I understand that some of the potential risks of receiving the Services via telemedicine include:  Delay or interruption in medical evaluation due to technological equipment failure or disruption; Information transmitted may not be sufficient (e.g.  poor resolution of images) to allow for appropriate medical decision making by the Practitioner; and/or  In rare instances, security protocols could fail, causing a breach of personal health information.  Furthermore, I acknowledge that it is my responsibility to provide information about my medical history, conditions and care that is complete and accurate to the best of my ability. I acknowledge that Practitioner's advice, recommendations, and/or decision may be based on factors not within their control, such as incomplete or inaccurate data provided by me or distortions of diagnostic images or specimens that may result from electronic transmissions. I understand that the practice of medicine is not an exact science and that Practitioner makes no warranties or guarantees regarding treatment outcomes. I acknowledge that a copy of this consent can be made available to me via my patient portal [facility] MyChart), or I can request a printed copy by calling the office of [facility].    I understand that my insurance will be billed for this visit.   I have read or had this consent read to me. I understand the contents of this consent, which adequately explains the benefits and risks of the Services being provided via telemedicine.  I have been provided ample opportunity to ask questions regarding this consent and the Services and have had my questions answered to my satisfaction. I give my informed consent for the services to be provided through the use of telemedicine in my medical care

## 2022-08-21 NOTE — Telephone Encounter (Signed)
   Name: George [name]  DOB: [birth year]  MRN: [id number]  Primary Cardiologist: Sherryl Manges, MD   Preoperative team, please contact this patient and set up a phone call appointment for further preoperative risk assessment. Please obtain consent and complete medication review. Thank you for your help.  I confirm that guidance regarding antiplatelet and oral anticoagulation therapy has been completed and, if necessary, noted below.  Per pharm D: Patient can hold Eliquis for 2 days prior to procedure.   Carlos Levering, NP 08/21/2022, 1:18 PM [facility]

## 2022-08-22 ENCOUNTER — Other Ambulatory Visit: Payer: Self-pay | Admitting: Physician Assistant

## 2022-08-28 ENCOUNTER — Other Ambulatory Visit (HOSPITAL_COMMUNITY): Payer: Self-pay | Admitting: Physician Assistant

## 2022-08-28 NOTE — Telephone Encounter (Signed)
Prescription refill request for Eliquis received. Indication: AF Last office visit: 04/08/22  Odessa Fleming MD Scr: 0.98 on 07/18/22  Epic Age: 72 Weight: 74.4kg  Based on above findings Eliquis 5mg  twice daily is the appropriate dose.  Refill approved.

## 2022-09-02 ENCOUNTER — Ambulatory Visit: Payer: Medicare HMO | Attending: Cardiovascular Disease | Admitting: Nurse Practitioner

## 2022-09-02 DIAGNOSIS — Z01810 Encounter for preprocedural cardiovascular examination: Secondary | ICD-10-CM

## 2022-09-02 NOTE — Progress Notes (Signed)
Virtual Visit via Telephone Note   Because of George [name]'s co-morbid illnesses, he is at least at moderate risk for complications without adequate follow up.  This format is felt to be most appropriate for this patient at this time.  The patient did not have access to video technology/had technical difficulties with video requiring transitioning to audio format only (telephone).  All issues noted in this document were discussed and addressed.  No physical exam could be performed with this format.  Please refer to the patient's chart for his consent to telehealth for [facility].  Evaluation Performed:  Preoperative cardiovascular risk assessment _____________   Date:  09/02/2022   Patient ID:  George [name], DOB 07-08-1950, MRN [id number] Patient Location:  Home Provider location:   Office  Primary Care Provider:  Melida Quitter, PA Primary Cardiologist:  Sherryl Manges, MD  Chief Complaint / Patient Profile   72 y.o. y/o male with a h/o paroxysmal atrial fibrillation on Tikosyn, sinus node dysfunction, high-grade heart block s/p PPM (CRT-P), PVCs, atrial tachycardia, NICM, and hypertension who is pending colonoscopy on 09/11/2022 with Dr. Levora Angel of [name] and presents today for telephonic preoperative cardiovascular risk assessment.  History of Present Illness    George [name] is a 72 y.o. male who presents via audio/video conferencing for a telehealth visit today.  Pt was last seen in cardiology clinic on 04/08/2022 by Dr. Graciela Husbands.  At that time George [name] was doing well.  The patient is now pending procedure as outlined above. Since his last visit, he has been stable exam with.  He notes he has already reached the donut hole for his place.  He is going to contact our office to inquire about samples.  He denies chest pain, palpitations, dyspnea, pnd, orthopnea, n, v, dizziness, syncope, edema, weight gain, or early satiety. All other systems reviewed and are  otherwise negative except as noted above.   Past Medical History    Past Medical History:  Diagnosis Date   AICD (automatic cardioverter/defibrillator) present    Arthritis THUMBS   LBBB (left bundle branch block)    bilat. BBB- PPM   OSA (obstructive sleep apnea) 05/17/2021   Peyronie's disease    Presence of permanent cardiac pacemaker    Past Surgical History:  Procedure Laterality Date   CARDIOVASCULAR STRESS TEST  10-18-2002  DR  CRENSHAW   NORMAL ADENOSINE CARDIOLITE/ EF 60%   CARDIOVERSION N/A 08/18/2020   Procedure: CARDIOVERSION;  Surgeon: Sande Rives, MD;  Location: [facility] ENDOSCOPY;  Service: Cardiovascular;  Laterality: N/A;   CARDIOVERSION N/A 11/15/2020   Procedure: CARDIOVERSION;  Surgeon: Quintella Reichert, MD;  Location: [facility] ENDOSCOPY;  Service: Cardiovascular;  Laterality: N/A;   CATARACT EXTRACTION W/ INTRAOCULAR LENS IMPLANT  2006   LEFT EYE   EP IMPLANTABLE DEVICE N/A 10/18/2015   Procedure: BiV Pacemaker Insertion CRT-P;  Surgeon: Duke Salvia, MD;  Location: [facility] INVASIVE CV LAB;  Service: Cardiovascular;  Laterality: N/A;   INGUINAL HERNIA REPAIR Right 02/23/2019   Procedure: RIGHT INGUINAL HERNIA REPAIR WITH MESH;  Surgeon: Harriette Bouillon, MD;  Location: [facility];  Service: General;  Laterality: Right;   LAPAROSCOPIC CHOLECYSTECTOMY  10-09-2007   LEFT HEART CATH AND CORONARY ANGIOGRAPHY N/A 01/30/2018   Procedure: LEFT HEART CATH AND CORONARY ANGIOGRAPHY;  Surgeon: Lyn Records, MD;  Location: [facility] INVASIVE CV LAB;  Service: Cardiovascular;  Laterality: N/A;   NESBIT PROCEDURE  01/17/2012   Procedure: NESBIT  PROCEDURE;  Surgeon: Garnett Farm, MD;  Location: [facility];  Service: Urology;  Laterality: N/A;  16 dot plication    REPAIR DETACHED RETINA, LEFT EYE  1993   REPAIR LEFT INGUINAL HERNIA  2005    Allergies  No Known Allergies  Home Medications    Prior to Admission medications   Medication Sig Start Date End Date  Taking? Authorizing Provider  apixaban (ELIQUIS) 5 MG TABS tablet TAKE 1 TABLET BY MOUTH TWICE A DAY 08/28/22   Duke Salvia, MD  atorvastatin (LIPITOR) 20 MG tablet Take 1 tablet by mouth 2 times per week. 04/08/22   Duke Salvia, MD  carvedilol (COREG) 25 MG tablet Take 1 tablet (25 mg total) by mouth 2 (two) times daily with a meal. 06/18/22   Duke Salvia, MD  Cholecalciferol (VITAMIN D3) 5000 units TABS 5,000 IU OTC vitamin D3 daily. 05/13/17   Thomasene Lot, DO  clotrimazole-betamethasone (LOTRISONE) cream Apply 1 Application topically as needed (for skin). 08/01/22   Melida Quitter, PA  dofetilide (TIKOSYN) 500 MCG capsule TAKE ONE CAPSULE BY MOUTH TWICE A DAY 05/06/22   Duke Salvia, MD  DULoxetine (CYMBALTA) 20 MG capsule Take 2 capsules (40 mg total) by mouth at bedtime. 04/03/22   Antony Madura, MD  fexofenadine (ALLEGRA) 180 MG tablet TAKE 1 TABLET (180 MG TOTAL) BY MOUTH DAILY. DURING ALLERGY SEASON 12/08/20   Mayer Masker, PA-C  fluticasone (FLONASE) 50 MCG/ACT nasal spray USE 1 SPRAY IN EACH NOSTRIL AFTER SINUS RINSES TWICE DAILY. Patient taking differently: prn 11/03/20   Mayer Masker, PA-C  furosemide (LASIX) 20 MG tablet TAKE 1 TABLET BY MOUTH EVERY DAY 08/23/22   Sheilah Pigeon, PA-C  hydrocortisone (ANUSOL-HC) 2.5 % rectal cream Place 1 Application rectally 2 (two) times daily. 01/28/22   Mayer Masker, PA-C  losartan (COZAAR) 25 MG tablet TAKE 1/2 TABLET BY MOUTH EVERY DAY 06/24/22   Sheilah Pigeon, PA-C  Multiple Vitamins-Minerals (CENTRUM ADULTS) TABS Take 1 tablet by mouth daily.    [provider]  spironolactone (ALDACTONE) 25 MG tablet TAKE 1 TABLET (25 MG TOTAL) BY MOUTH DAILY. 07/01/22   Duke Salvia, MD  tamsulosin (FLOMAX) 0.4 MG CAPS capsule Take 0.4 mg by mouth daily. 02/08/20   [provider]    Physical Exam    Vital Signs:  George [name] does not have vital signs available for review today.  Given telephonic nature of  communication, physical exam is limited. AAOx3. NAD. Normal affect.  Speech and respirations are unlabored.  Accessory Clinical Findings    None  Assessment & Plan    1.  Preoperative Cardiovascular Risk Assessment:  According to the Revised Cardiac Risk Index (RCRI), his Perioperative Risk of Major Cardiac Event is (%): 0.4. His Functional Capacity in METs is: 7.34 according to the Duke Activity Status Index (DASI). Therefore, based on ACC/AHA guidelines, patient would be at acceptable risk for the planned procedure without further cardiovascular testing.  The patient was advised that if he develops new symptoms prior to surgery to contact our office to arrange for a follow-up visit, and he verbalized understanding.  Per office protocol, patient can hold Eliquis for 2 days prior to procedure.  Please resume Eliquis as soon as possible postprocedure, at the discretion of the surgeon.     A copy of this note will be routed to requesting surgeon.  Time:   Today, I have spent 8 minutes with the patient  with telehealth technology discussing medical history, symptoms, and management plan.     Joylene Grapes, NP  09/02/2022, 10:36 AM

## 2022-09-04 ENCOUNTER — Telehealth: Payer: Self-pay | Admitting: Internal Medicine

## 2022-09-04 DIAGNOSIS — I4819 Other persistent atrial fibrillation: Secondary | ICD-10-CM

## 2022-09-04 MED ORDER — APIXABAN 5 MG PO TABS
5.0000 mg | ORAL_TABLET | Freq: Two times a day (BID) | ORAL | 0 refills | Status: DC
Start: 2022-09-04 — End: 2023-03-25

## 2022-09-04 NOTE — Telephone Encounter (Signed)
I called and spoke with wife and patient. Pt wife requesting 90 days worth of samples. Advised we cannot give 90 days worth. Could give a few weeks which she declined. We did discuss pt assistance but she said they do not qualify. Advised that she could call her insurance company to see how much generic pradaxa would be or there is a good rx coupon for ~ 60/month. States they will talk with Dr. Graciela Husbands next month about it and will call insurance company. Not interested in warfarin. Will continue to pay for Eliquis for now.

## 2022-09-04 NOTE — Telephone Encounter (Signed)
Prescription refill request for Eliquis received. Indication: a fib Last office visit: 09/02/22 Scr: 0.98 07/18/22 epic Age: 72 Weight: 74kg

## 2022-09-04 NOTE — Telephone Encounter (Signed)
Patient calling the office for samples of medication:   1.  What medication and dosage are you requesting samples for? apixaban (ELIQUIS) 5 MG TABS tablet   2.  Are you currently out of this medication? Have a day or two left. Patient is in doughnut hole for medication and medication is very expensive to get

## 2022-09-04 NOTE — Telephone Encounter (Signed)
*  STAT* If patient is at the pharmacy, call can be transferred to refill team.   1. Which medications need to be refilled? (please list name of each medication and dose if known) apixaban (ELIQUIS) 5 MG TABS tablet   2. Which pharmacy/location (including street and city if local pharmacy) is medication to be sent to? [id number] - [place], [place] - [address] [place] [facility]   3. Do they need a 30 day or 90 day supply? 90

## 2022-09-11 DIAGNOSIS — Z09 Encounter for follow-up examination after completed treatment for conditions other than malignant neoplasm: Secondary | ICD-10-CM | POA: Diagnosis not present

## 2022-09-11 DIAGNOSIS — D123 Benign neoplasm of transverse colon: Secondary | ICD-10-CM | POA: Diagnosis not present

## 2022-09-11 DIAGNOSIS — K5730 Diverticulosis of large intestine without perforation or abscess without bleeding: Secondary | ICD-10-CM | POA: Diagnosis not present

## 2022-09-11 DIAGNOSIS — Z86010 Personal history of colonic polyps: Secondary | ICD-10-CM | POA: Diagnosis not present

## 2022-09-11 LAB — HM COLONOSCOPY

## 2022-09-12 DIAGNOSIS — Z1330 Encounter for screening examination for mental health and behavioral disorders, unspecified: Secondary | ICD-10-CM | POA: Diagnosis not present

## 2022-09-12 DIAGNOSIS — N433 Hydrocele, unspecified: Secondary | ICD-10-CM | POA: Diagnosis not present

## 2022-09-12 DIAGNOSIS — N5089 Other specified disorders of the male genital organs: Secondary | ICD-10-CM | POA: Diagnosis not present

## 2022-09-15 DIAGNOSIS — G4733 Obstructive sleep apnea (adult) (pediatric): Secondary | ICD-10-CM | POA: Diagnosis not present

## 2022-09-19 DIAGNOSIS — D123 Benign neoplasm of transverse colon: Secondary | ICD-10-CM | POA: Diagnosis not present

## 2022-09-24 NOTE — Progress Notes (Signed)
I saw George [name] in neurology clinic on 10/02/22 in follow up for neuropathy.  HPI: George [name] is a 72 y.o. year old male with a history of pre-diabetes, HTN, pAfib (on Tikosyn and eliquis), 2nd degree AV block s/p CRT-P, NICM, OSA on CPAP, anxiety who we last saw on 04/03/22.  To briefly review: Patient has had difficulties for about 2 years. He was having trouble getting off the floor when kneeling down. He spoke to cardiology who felt it may be a blood flow issue and recommended compression stockings. He had blood flow tests done that he states were normal. About 1.5 years ago, he noticed tingling, burning in his right leg. He feels like it is starting on his left leg as well, but not as bad. He feels like he is losing strength throughout his body, including his arms. He denies ever really having pain in his arms. He does endorse significant cramps in both hands.   When he is standing, he feels like his is leaning forward. When he is walking, he is off balance and will bump into things. He takes a lot of medications and has wondered if it is medication side effects. He does not feel dizzy or lightheaded. He has fallen about 2-3 times in the past year. It is usually getting his feet tangled up or once the dog tripped him up. Patient mentions that his wife tells him that he does not pick up his feet enough. He denies freezing while walking or trying to move.   Patient was started on Cymbalta 20 mg daily in November for "attitude problems." This has helped a bit with his pain. He has not tried any other medications and has not done any physical therapy.   Recent EMG performed by me on 02/04/22 showed a mild axonal large fiber polyneuropathy, severe right carpal tunnel, and the residuals of an old right C8 radiculopathy. Patient was referred to neurology for further work up and management.   The patient does not impaired sweating, heat or cold intolerance, gastroparetic early satiety,  postprandial abdominal bloating, constipation, or bowel or bladder dyscontrol. He does endorse dry eyes and mouth. He also endorses some lightheadedness when standing requiring him to stand for a few seconds before this resolves.   He does not report any constitutional symptoms like fever, night sweats, anorexia or unintentional weight loss.   EtOH use: No  Restrictive diet? No Family history of neuropathy/myopathy/NM disease? Brother had neuropathy (does not know why)   Of note, patient was previously seen in this office by Dr. Karel Jarvis for cognitive complaints. Neuropsych testing in 2022 showed mild cognitive impairment (fairly benign findings per assessment).  Most recent Assessment and Plan (04/03/22): George [name] is a 72 y.o. male who presents for evaluation of burning and tingling in legs and imbalance. He has a relevant medical history of pre-diabetes, HTN, pAfib (on Tikosyn and eliquis), 2nd degree AV block s/p CRT-P, NICM, OSA on CPAP, anxiety. His neurological examination is pertinent for distal sensory deficit in bilateral legs and sensory deficit in first 2 digits of bilateral hands. Available diagnostic data is significant for EMG showing neuropathy and right carpal tunnel. His B12 was 418 and HbA1c 5.8. The symptoms in patient's legs is most consistent with a distal symmetric polyneuropathy. His only known risk factor currently is mild pre-diabetes. I will send labs for other treatable causes. I will send to PT for balance. The symptoms in hands are most consistent with bilateral carpal  tunnel syndrome.   PLAN: -Blood work: IFE, B1 -Physical therapy for imbalance -Symptom relief: Increase Cymbalta to 40 mg qhs -Lidocaine cream PRN -Cock up wrist splints for CTS  Since their last visit: B1 and IFE were unremarkable.  Patient has had some improvement in burning in his right leg. He still has pain in both legs. He thinks the Cymbalta is working, but maybe not enough. Patient also  tried lidocaine cream, that might have helped some. He also started massage therapy in his hips and legs.  Wife mentions he walks slow and doesn't pick up his feet. He has had a couple of falls since last visit. He tripped over a mat in the kitchen. The other time was at night when he tripped over something.   Patient did physical therapy. He also feels this helped some. He has recently joined a gym as well.   Patient continues to have hand symptoms from carpal tunnel that is about the same as prior. He has not tried wrist splints yet.  Patient has no new complaints today.   MEDICATIONS:  Outpatient Encounter Medications as of 10/02/2022  Medication Sig   apixaban (ELIQUIS) 5 MG TABS tablet Take 1 tablet (5 mg total) by mouth 2 (two) times daily.   atorvastatin (LIPITOR) 20 MG tablet Take 1 tablet by mouth 2 times per week.   carvedilol (COREG) 25 MG tablet Take 1 tablet (25 mg total) by mouth 2 (two) times daily with a meal.   Cholecalciferol (VITAMIN D3) 5000 units TABS 5,000 IU OTC vitamin D3 daily.   clotrimazole-betamethasone (LOTRISONE) cream Apply 1 Application topically as needed (for skin).   dofetilide (TIKOSYN) 500 MCG capsule TAKE ONE CAPSULE BY MOUTH TWICE A DAY   fexofenadine (ALLEGRA) 180 MG tablet TAKE 1 TABLET (180 MG TOTAL) BY MOUTH DAILY. DURING ALLERGY SEASON   fluticasone (FLONASE) 50 MCG/ACT nasal spray USE 1 SPRAY IN EACH NOSTRIL AFTER SINUS RINSES TWICE DAILY. (Patient taking differently: prn)   furosemide (LASIX) 20 MG tablet TAKE 1 TABLET BY MOUTH EVERY DAY   losartan (COZAAR) 25 MG tablet TAKE 1/2 TABLET BY MOUTH EVERY DAY   Multiple Vitamins-Minerals (CENTRUM ADULTS) TABS Take 1 tablet by mouth daily.   spironolactone (ALDACTONE) 25 MG tablet TAKE 1 TABLET (25 MG TOTAL) BY MOUTH DAILY.   tamsulosin (FLOMAX) 0.4 MG CAPS capsule Take 0.4 mg by mouth daily.   [DISCONTINUED] DULoxetine (CYMBALTA) 20 MG capsule Take 2 capsules (40 mg total) by mouth at bedtime.    DULoxetine (CYMBALTA) 60 MG capsule Take 1 capsule (60 mg total) by mouth at bedtime.   hydrocortisone (ANUSOL-HC) 2.5 % rectal cream Place 1 Application rectally 2 (two) times daily. (Patient not taking: Reported on 10/02/2022)   No facility-administered encounter medications on file as of 10/02/2022.    PAST MEDICAL HISTORY: Past Medical History:  Diagnosis Date   AICD (automatic cardioverter/defibrillator) present    Arthritis THUMBS   LBBB (left bundle branch block)    bilat. BBB- PPM   OSA (obstructive sleep apnea) 05/17/2021   Peyronie's disease    Presence of permanent cardiac pacemaker     PAST SURGICAL HISTORY: Past Surgical History:  Procedure Laterality Date   CARDIOVASCULAR STRESS TEST  10-18-2002  DR  CRENSHAW   NORMAL ADENOSINE CARDIOLITE/ EF 60%   CARDIOVERSION N/A 08/18/2020   Procedure: CARDIOVERSION;  Surgeon: Sande Rives, MD;  Location: [facility] ENDOSCOPY;  Service: Cardiovascular;  Laterality: N/A;   CARDIOVERSION N/A 11/15/2020   Procedure: CARDIOVERSION;  Surgeon: Quintella Reichert, MD;  Location: [facility] ENDOSCOPY;  Service: Cardiovascular;  Laterality: N/A;   CATARACT EXTRACTION W/ INTRAOCULAR LENS IMPLANT  2006   LEFT EYE   EP IMPLANTABLE DEVICE N/A 10/18/2015   Procedure: BiV Pacemaker Insertion CRT-P;  Surgeon: Duke Salvia, MD;  Location: [facility] INVASIVE CV LAB;  Service: Cardiovascular;  Laterality: N/A;   INGUINAL HERNIA REPAIR Right 02/23/2019   Procedure: RIGHT INGUINAL HERNIA REPAIR WITH MESH;  Surgeon: Harriette Bouillon, MD;  Location: [facility];  Service: General;  Laterality: Right;   LAPAROSCOPIC CHOLECYSTECTOMY  10-09-2007   LEFT HEART CATH AND CORONARY ANGIOGRAPHY N/A 01/30/2018   Procedure: LEFT HEART CATH AND CORONARY ANGIOGRAPHY;  Surgeon: Lyn Records, MD;  Location: [facility] INVASIVE CV LAB;  Service: Cardiovascular;  Laterality: N/A;   NESBIT PROCEDURE  01/17/2012   Procedure: NESBIT PROCEDURE;  Surgeon: Garnett Farm, MD;  Location: [facility];  Service: Urology;  Laterality: N/A;  16 dot plication    REPAIR DETACHED RETINA, LEFT EYE  1993   REPAIR LEFT INGUINAL HERNIA  2005    ALLERGIES: No Known Allergies  FAMILY HISTORY: Family History  Problem Relation Age of Onset   Diabetes Mother    Hypertension Mother    Hyperlipidemia Mother    Heart disease Father    Hyperlipidemia Father    Hypertension Father    Arrhythmia Father        paf   Healthy Daughter    Healthy Son    Heart disease Paternal Grandfather    Heart attack Paternal Grandfather     SOCIAL HISTORY: Social History   Tobacco Use   Smoking status: Never    Passive exposure: Never   Smokeless tobacco: Never  Vaping Use   Vaping status: Never Used  Substance Use Topics   Alcohol use: No   Drug use: No   Social History   Social History Narrative   Right handed    Lives with wife      What is your current occupation?retired   Do you live at home alone?;lives with wife   One story home   Caffeine 1/2 liter a day     Objective:  Vital Signs:  BP 123/76   Pulse 76   Resp 18   Ht 5\' 8"  (1.727 m)   Wt 165 lb (74.8 kg)   SpO2 98%   BMI 25.09 kg/m   General: General appearance: Awake and alert. No distress. Cooperative with exam.  HEENT: Atraumatic. Anicteric. Lungs: Non-labored breathing on room air  Extremities: No edema. No obvious deformity.  Musculoskeletal: No obvious joint swelling.  Neurological: Mental Status: Alert. Speech fluent. No pseudobulbar affect Cranial Nerves: CNII: No RAPD. Visual fields intact. CNIII, IV, VI: PERRL. No nystagmus. EOMI. CN V: Facial sensation intact bilaterally to fine touch. CN VII: Facial muscles symmetric and strong. No ptosis at rest. CN VIII: Hears finger rub well bilaterally. CN IX: No hypophonia. CN X: Palate elevates symmetrically. CN XI: Full strength shoulder shrug bilaterally. CN XII: Tongue protrusion full and midline. No atrophy or fasciculations. No  significant dysarthria Motor: Tone is normal. No abnormal movements appreciated.  Individual muscle group testing (MRC grade out of 5):  Movement     Neck flexion 5    Neck extension 5     Right Left   Shoulder abduction 5 5   Elbow flexion 5 5   Elbow extension 5 5   Finger abduction - FDI 5  5   Finger abduction - ADM 5 5   Finger extension 5 5   Finger distal flexion - 2/3 5 5    Finger distal flexion - 4/5 5 5    Thumb flexion - FPL 5 5   Thumb abduction - APB 4+ 4+    Hip flexion 5 5   Knee extension 5 5   Knee flexion 5 5   Dorsiflexion 5 5   Plantarflexion 5 5    Reflexes:  Right Left  Bicep 1+ 1+  Tricep 1+ 1+  BrRad 1+ 1+  Knee Trace Trace  Ankle 0 0   Sensation: Pinprick: Increased sensitivity in bilateral lower extremities below the knees, otherwise intact Gait: Able to rise from chair with arms crossed unassisted. Wide based gait.  Lab and Test Review: New results: 04/03/21: IFE: no M protein B1 wnl  07/18/22: Vit D wnl B12: 330 Folate wnl  Previously reviewed results: 11/09/21: B12: 418 Folate wnl HbA1c: 5.8 TSH: 1.56 CBC and CMP wnl   Imaging: EMG (02/04/22): NCV & EMG Findings: Extensive electrodiagnostic evaluation of the right upper and lower limbs show: Right sural, superficial peroneal, and median sensory responses are absent. Right ulnar and radial sensory responses are within normal limits. Right peroneal (EDB) motor response is absent. Right tibial (AH) motor response shows reduced amplitude (3.2 mV). Right median (APB) motor response shows prolonged distal onset latency (5.1 ms). Right peroneal (TA) and ulnar (ADM) motor responses are within normal limits. Right H reflex is absent. Chronic motor axon loss changes without accompanying active denervation changes are seen in the right tibialis anterior, medial head of gastrocnemius, abductor pollicis brevis, first dorsal interosseous, and extensor indicis proprius muscles.    Impression: This is an abnormal electrodiagnostic evaluation. The findings are most consistent with the following: Evidence of a generalized large fiber sensorimotor polyneuropathy, axon loss in type, mild in degree electrically. Evidence of a right median mononeuropathy at or distal to the wrist, consistent with carpal tunnel syndrome, severe in degree electrically. The residuals of an old intraspinal canal lesion (ie: motor radiculopathy) at the right C8 root, mild in degree electrically. No electrodiagnostic evidence of a right lumbosacral (L3-S1) motor radiculopathy.   CT head wo contrast (03/03/20): FINDINGS: Brain: Age-appropriate atrophy, with progression since 2008. Negative for hydrocephalus. Negative for acute infarct, hemorrhage, mass. Small chronic lacunar infarction in the left anterior frontal white matter.   Vascular: Negative for hyperdense vessel   Skull: Negative   Sinuses/Orbits: Paranasal sinuses clear. Left cataract extraction. No orbital lesion.   Other: None   IMPRESSION: No acute abnormality. Age-appropriate atrophy. Small chronic infarct left frontal white matter.  ASSESSMENT: This is George [name], a 72 y.o. male with distal symmetric polyneuropathy c/b imbalance. His only known risk factor is pre-diabetes (HbA1c of 5.8). B12 was borderline low in 07/2022 at 330 as well, which can contribute. B1, and IFE were normal.  Plan: -Increase Cymbalta to 60 mg at bedtime -Again recommend wrist splints -Continue home PT exercises -Fall precautions discussed -B12 1000 mcg daily due to borderline low B12 level (I noticed this after appointment, but caught wife and told her and also sent a MyChart message to patient)  Return to clinic in 1 year or sooner if needed  Total time spent reviewing records, interview, history/exam, documentation, and coordination of care on day of encounter:  45 min  Jacquelyne Balint, MD

## 2022-10-02 ENCOUNTER — Encounter: Payer: Self-pay | Admitting: Neurology

## 2022-10-02 ENCOUNTER — Ambulatory Visit: Payer: Medicare HMO | Admitting: Neurology

## 2022-10-02 VITALS — BP 123/76 | HR 76 | Resp 18 | Ht 68.0 in | Wt 165.0 lb

## 2022-10-02 DIAGNOSIS — G5601 Carpal tunnel syndrome, right upper limb: Secondary | ICD-10-CM

## 2022-10-02 DIAGNOSIS — M5412 Radiculopathy, cervical region: Secondary | ICD-10-CM

## 2022-10-02 DIAGNOSIS — R7303 Prediabetes: Secondary | ICD-10-CM

## 2022-10-02 DIAGNOSIS — R202 Paresthesia of skin: Secondary | ICD-10-CM

## 2022-10-02 DIAGNOSIS — R2689 Other abnormalities of gait and mobility: Secondary | ICD-10-CM

## 2022-10-02 DIAGNOSIS — R269 Unspecified abnormalities of gait and mobility: Secondary | ICD-10-CM

## 2022-10-02 DIAGNOSIS — G5603 Carpal tunnel syndrome, bilateral upper limbs: Secondary | ICD-10-CM

## 2022-10-02 DIAGNOSIS — F419 Anxiety disorder, unspecified: Secondary | ICD-10-CM

## 2022-10-02 DIAGNOSIS — G629 Polyneuropathy, unspecified: Secondary | ICD-10-CM

## 2022-10-02 MED ORDER — DULOXETINE HCL 60 MG PO CPEP
60.0000 mg | ORAL_CAPSULE | Freq: Every day | ORAL | 3 refills | Status: DC
Start: 2022-10-02 — End: 2023-09-10

## 2022-10-02 NOTE — Patient Instructions (Addendum)
I saw you for follow up for neuropathy. We will increase your Cymbalta to 60 mg at bedtime to see if this helps the tingling and burning.  For your carpal tunnel syndrome: Cock up wrist splint for carpal tunnel symptoms can be bought in local drug stores or online. This should especially be worn at night while sleeping.    Continue to stay active and do exercises given by physical therapy.  I want to see you back in clinic in about 1 year or sooner if needed.    Preventing Falls at [facility] are common, often dreaded events in the lives of older people. Aside from the obvious injuries and even death that may result, fall can cause wide-ranging consequences including loss of independence, mental decline, decreased activity and mobility. Younger people are also at risk of falling, especially those with chronic illnesses and fatigue.  Ways to reduce risk for falling Examine diet and medications. Warm foods and alcohol dilate blood vessels, which can lead to dizziness when standing. Sleep aids, antidepressants and pain medications can also increase the likelihood of a fall.  Get a vision exam. Poor vision, cataracts and glaucoma increase the chances of falling.  Check foot gear. Shoes should fit snugly and have a sturdy, nonskid sole and a broad, low heel  Participate in a physician-approved exercise program to build and maintain muscle strength and improve balance and coordination. Programs that use ankle weights or stretch bands are excellent for muscle-strengthening. Water aerobics programs and low-impact Tai Chi programs have also been shown to improve balance and coordination.  Increase vitamin D intake. Vitamin D improves muscle strength and increases the amount of calcium the body is able to absorb and deposit in bones.  How to prevent falls from common hazards Floors - Remove all loose wires, cords, and throw rugs. Minimize clutter. Make sure rugs are anchored and smooth. Keep  furniture in its usual place.  Chairs -- Use chairs with straight backs, armrests and firm seats. Add firm cushions to existing pieces to add height.  Bathroom - Install grab bars and non-skid tape in the tub or shower. Use a bathtub transfer bench or a shower chair with a back support Use an elevated toilet seat and/or safety rails to assist standing from a low surface. Do not use towel racks or bathroom tissue holders to help you stand.  Lighting - Make sure halls, stairways, and entrances are well-lit. Install a night light in your bathroom or hallway. Make sure there is a light switch at the top and bottom of the staircase. Turn lights on if you get up in the middle of the night. Make sure lamps or light switches are within reach of the bed if you have to get up during the night.  Kitchen - Install non-skid rubber mats near the sink and stove. Clean spills immediately. Store frequently used utensils, pots, pans between waist and eye level. This helps prevent reaching and bending. Sit when getting things out of lower cupboards.  Living room/ Bedrooms - Place furniture with wide spaces in between, giving enough room to move around. Establish a route through the living room that gives you something to hold onto as you walk.  Stairs - Make sure treads, rails, and rugs are secure. Install a rail on both sides of the stairs. If stairs are a threat, it might be helpful to arrange most of your activities on the lower level to reduce the number of times you must climb the  stairs.  Entrances and doorways - Install metal handles on the walls adjacent to the doorknobs of all doors to make it more secure as you travel through the doorway.  Tips for maintaining balance Keep at least one hand free at all times. Try using a backpack or fanny pack to hold things rather than carrying them in your hands. Never carry objects in both hands when walking as this interferes with keeping your balance.  Attempt to  swing both arms from front to back while walking. This might require a conscious effort if Parkinson's disease has diminished your movement. It will, however, help you to maintain balance and posture, and reduce fatigue.  Consciously lift your feet off of the ground when walking. Shuffling and dragging of the feet is a common culprit in losing your balance.  When trying to navigate turns, use a "U" technique of facing forward and making a wide turn, rather than pivoting sharply.  Try to stand with your feet shoulder-length apart. When your feet are close together for any length of time, you increase your risk of losing your balance and falling.  Do one thing at a time. Don't try to walk and accomplish another task, such as reading or looking around. The decrease in your automatic reflexes complicates motor function, so the less distraction, the better.  Do not wear rubber or gripping soled shoes, they might "catch" on the floor and cause tripping.  Move slowly when changing positions. Use deliberate, concentrated movements and, if needed, use a grab bar or walking aid. Count 15 seconds between each movement. For example, when rising from a seated position, wait 15 seconds after standing to begin walking.  If balance is a continuous problem, you might want to consider a walking aid such as a cane, walking stick, or walker. Once you've mastered walking with help, you might be ready to try it on your own again.   The physicians and staff at [facility] are committed to providing excellent care. You may receive a survey requesting feedback about your experience at our office. We strive to receive "very good" responses to the survey questions. If you feel that your experience would prevent you from giving the office a "very good " response, please contact our office to try to remedy the situation. We may be reached at [phone]. Thank you for taking the time out of your busy day to complete the  survey.  Jacquelyne Balint, MD [facility]

## 2022-10-03 ENCOUNTER — Ambulatory Visit (INDEPENDENT_AMBULATORY_CARE_PROVIDER_SITE_OTHER): Payer: Medicare HMO

## 2022-10-03 VITALS — Ht 68.0 in | Wt 165.0 lb

## 2022-10-03 DIAGNOSIS — Z0000 Encounter for general adult medical examination without abnormal findings: Secondary | ICD-10-CM | POA: Diagnosis not present

## 2022-10-03 NOTE — Progress Notes (Signed)
Subjective:   George [name] is a 72 y.o. male who presents for Medicare Annual/Subsequent preventive examination.  Visit Complete: Virtual  I connected with  [name] on 10/03/22 by a audio enabled telemedicine application and verified that I am speaking with the correct person using two identifiers.  Patient Location: Home  Provider Location: Home Office  I discussed the limitations of evaluation and management by telemedicine. The patient expressed understanding and agreed to proceed.  Patient Medicare AWV questionnaire was completed by the patient on ; I have confirmed that all information answered by patient is correct and no changes since this date.  Review of Systems     Cardiac Risk Factors include: advanced age (>36men, >84 women);male gender;hypertension     Objective:    Today's Vitals   10/03/22 1013  Weight: 165 lb (74.8 kg)  Height: 5\' 8"  (1.727 m)   Body mass index is 25.09 kg/m.     10/03/2022   10:21 AM 10/02/2022   10:18 AM 04/03/2022    7:48 AM 05/17/2021    8:17 PM 02/25/2021    8:00 PM 12/13/2020   10:00 AM 12/12/2020    3:00 PM  Advanced Directives  Does Patient Have a Medical Advance Directive? No No Yes No No No No  Type of Surveyor, minerals;Living will      Would patient like information on creating a medical advance directive? No - Patient declined No - Patient declined  No - Patient declined No - Patient declined  Yes (Inpatient - patient requests chaplain consult to create a medical advance directive)    Current Medications (verified) Outpatient Encounter Medications as of 10/03/2022  Medication Sig   apixaban (ELIQUIS) 5 MG TABS tablet Take 1 tablet (5 mg total) by mouth 2 (two) times daily.   atorvastatin (LIPITOR) 20 MG tablet Take 1 tablet by mouth 2 times per week.   carvedilol (COREG) 25 MG tablet Take 1 tablet (25 mg total) by mouth 2 (two) times daily with a meal.   Cholecalciferol (VITAMIN D3) 5000  units TABS 5,000 IU OTC vitamin D3 daily.   clotrimazole-betamethasone (LOTRISONE) cream Apply 1 Application topically as needed (for skin).   dofetilide (TIKOSYN) 500 MCG capsule TAKE ONE CAPSULE BY MOUTH TWICE A DAY   DULoxetine (CYMBALTA) 60 MG capsule Take 1 capsule (60 mg total) by mouth at bedtime.   fexofenadine (ALLEGRA) 180 MG tablet TAKE 1 TABLET (180 MG TOTAL) BY MOUTH DAILY. DURING ALLERGY SEASON   fluticasone (FLONASE) 50 MCG/ACT nasal spray USE 1 SPRAY IN EACH NOSTRIL AFTER SINUS RINSES TWICE DAILY. (Patient taking differently: prn)   furosemide (LASIX) 20 MG tablet TAKE 1 TABLET BY MOUTH EVERY DAY   hydrocortisone (ANUSOL-HC) 2.5 % rectal cream Place 1 Application rectally 2 (two) times daily. (Patient not taking: Reported on 10/02/2022)   losartan (COZAAR) 25 MG tablet TAKE 1/2 TABLET BY MOUTH EVERY DAY   Multiple Vitamins-Minerals (CENTRUM ADULTS) TABS Take 1 tablet by mouth daily.   spironolactone (ALDACTONE) 25 MG tablet TAKE 1 TABLET (25 MG TOTAL) BY MOUTH DAILY.   tamsulosin (FLOMAX) 0.4 MG CAPS capsule Take 0.4 mg by mouth daily.   No facility-administered encounter medications on file as of 10/03/2022.    Allergies (verified) Patient has no known allergies.   History: Past Medical History:  Diagnosis Date   AICD (automatic cardioverter/defibrillator) present    Arthritis THUMBS   LBBB (left bundle branch block)    bilat.  BBB- PPM   OSA (obstructive sleep apnea) 05/17/2021   Peyronie's disease    Presence of permanent cardiac pacemaker    Past Surgical History:  Procedure Laterality Date   CARDIOVASCULAR STRESS TEST  10-18-2002  DR  CRENSHAW   NORMAL ADENOSINE CARDIOLITE/ EF 60%   CARDIOVERSION N/A 08/18/2020   Procedure: CARDIOVERSION;  Surgeon: Sande Rives, MD;  Location: [facility] ENDOSCOPY;  Service: Cardiovascular;  Laterality: N/A;   CARDIOVERSION N/A 11/15/2020   Procedure: CARDIOVERSION;  Surgeon: Quintella Reichert, MD;  Location: [facility] ENDOSCOPY;  Service:  Cardiovascular;  Laterality: N/A;   CATARACT EXTRACTION W/ INTRAOCULAR LENS IMPLANT  2006   LEFT EYE   EP IMPLANTABLE DEVICE N/A 10/18/2015   Procedure: BiV Pacemaker Insertion CRT-P;  Surgeon: Duke Salvia, MD;  Location: [facility] INVASIVE CV LAB;  Service: Cardiovascular;  Laterality: N/A;   INGUINAL HERNIA REPAIR Right 02/23/2019   Procedure: RIGHT INGUINAL HERNIA REPAIR WITH MESH;  Surgeon: Harriette Bouillon, MD;  Location: [facility];  Service: General;  Laterality: Right;   LAPAROSCOPIC CHOLECYSTECTOMY  10-09-2007   LEFT HEART CATH AND CORONARY ANGIOGRAPHY N/A 01/30/2018   Procedure: LEFT HEART CATH AND CORONARY ANGIOGRAPHY;  Surgeon: Lyn Records, MD;  Location: [facility] INVASIVE CV LAB;  Service: Cardiovascular;  Laterality: N/A;   NESBIT PROCEDURE  01/17/2012   Procedure: NESBIT PROCEDURE;  Surgeon: Garnett Farm, MD;  Location: [facility];  Service: Urology;  Laterality: N/A;  16 dot plication    REPAIR DETACHED RETINA, LEFT EYE  1993   REPAIR LEFT INGUINAL HERNIA  2005   Family History  Problem Relation Age of Onset   Diabetes Mother    Hypertension Mother    Hyperlipidemia Mother    Heart disease Father    Hyperlipidemia Father    Hypertension Father    Arrhythmia Father        paf   Healthy Daughter    Healthy Son    Heart disease Paternal Grandfather    Heart attack Paternal Grandfather    Social History   Socioeconomic History   Marital status: Married    Spouse name: Not on file   Number of children: 2   Years of education: 12   Highest education level: Not on file  Occupational History   Not on file  Tobacco Use   Smoking status: Never    Passive exposure: Never   Smokeless tobacco: Never  Vaping Use   Vaping status: Never Used  Substance and Sexual Activity   Alcohol use: No   Drug use: No   Sexual activity: Yes  Other Topics Concern   Not on file  Social History Narrative   Right handed    Lives with wife      What is your  current occupation?retired   Do you live at home alone?;lives with wife   One story home   Caffeine 1/2 liter a day    Social Determinants of Health   Financial Resource Strain: Low Risk  (10/03/2022)   Overall Financial Resource Strain (CARDIA)    Difficulty of Paying Living Expenses: Not hard at all  Food Insecurity: No Food Insecurity (10/03/2022)   Hunger Vital Sign    Worried About Running Out of Food in the Last Year: Never true    Ran Out of Food in the Last Year: Never true  Transportation Needs: No Transportation Needs (10/03/2022)   PRAPARE - Administrator, Civil Service (Medical): No  Lack of Transportation (Non-Medical): No  Physical Activity: Sufficiently Active (10/03/2022)   Exercise Vital Sign    Days of Exercise per Week: 3 days    Minutes of Exercise per Session: 60 min  Stress: No Stress Concern Present (10/03/2022)   [employer] of Occupational Health - Occupational Stress Questionnaire    Feeling of Stress : Not at all  Social Connections: Socially Integrated (10/03/2022)   Social Connection and Isolation Panel [NHANES]    Frequency of Communication with Friends and Family: More than three times a week    Frequency of Social Gatherings with Friends and Family: More than three times a week    Attends Religious Services: More than 4 times per year    Active Member of [employer] or Organizations: Yes    Attends Engineer, structural: More than 4 times per year    Marital Status: Married    Tobacco Counseling Counseling given: Not Answered   Clinical Intake:  Pre-visit preparation completed: No  Pain : No/denies pain     BMI - recorded: 25.09 Nutritional Status: BMI 25 -29 Overweight Nutritional Risks: None Diabetes: No  How often do you need to have someone help you when you read instructions, pamphlets, or other written materials from your doctor or pharmacy?: 1 - Never  Interpreter Needed?: No  Information entered by ::  Theresa Mulligan LPN   Activities of Daily Living    10/03/2022   10:20 AM 04/30/2022    8:38 AM  In your present state of health, do you have any difficulty performing the following activities:  Hearing? 0 0  Vision? 0 0  Difficulty concentrating or making decisions? 0 0  Walking or climbing stairs? 0 1  Dressing or bathing? 0 0  Doing errands, shopping? 0 0  Preparing Food and eating ? N   Using the Toilet? N   In the past six months, have you accidently leaked urine? N   Do you have problems with loss of bowel control? N   Managing your Medications? N   Managing your Finances? N   Housekeeping or managing your Housekeeping? N     Patient Care Team: Melida Quitter, PA as PCP - General (Family Medicine) Duke Salvia, MD as PCP - Cardiology (Cardiology) Duke Salvia, MD as PCP - Electrophysiology (Cardiology) Graylin Shiver, MD as Consulting Physician (Gastroenterology) Nita Sells, MD as Consulting Physician (Dermatology) Pa, [facility] as Consulting Physician (Optometry) Duke Salvia, MD as Consulting Physician (Cardiology) Van Clines, MD as Consulting Physician (Neurology)  Indicate any recent Medical Services you may have received from other than [facility] providers in the past year (date may be approximate).     Assessment:   This is a routine wellness examination for Aydien.  Hearing/Vision screen Hearing Screening - Comments:: Denies hearing difficulties   Vision Screening - Comments:: Wears rx glasses - up to date with routine eye exams with  [facility]  Dietary issues and exercise activities discussed:     Goals Addressed               This Visit's Progress     Stay Healthy (pt-stated)         Depression Screen    10/03/2022   10:19 AM 08/01/2022    2:16 PM 04/30/2022    8:37 AM 01/28/2022    1:51 PM 01/08/2022    3:17 PM 11/08/2021   10:03 AM 07/16/2021    9:24 AM  PHQ 2/9 Scores  PHQ - 2 Score 0 0 0 1 0 0 0  PHQ- 9 Score   0 2 3 0 3     Fall Risk    10/03/2022   10:20 AM 10/02/2022   10:18 AM 04/30/2022    8:38 AM 04/03/2022    7:48 AM 01/28/2022    1:52 PM  Fall Risk   Falls in the past year? 1 1 1 1 1   Number falls in past yr: 0 1 1 1 1   Injury with Fall? 0 0 0 0 0  Risk for fall due to : No Fall Risks      Follow up Falls prevention discussed Falls evaluation completed  Falls evaluation completed     MEDICARE RISK AT HOME:  Medicare Risk at Home - 10/03/22 1029     Any stairs in or around the home? Yes    If so, are there any without handrails? No    Home free of loose throw rugs in walkways, pet beds, electrical cords, etc? Yes    Adequate lighting in your home to reduce risk of falls? Yes    Life alert? No    Use of a cane, walker or w/c? No    Grab bars in the bathroom? Yes    Shower chair or bench in shower? No    Elevated toilet seat or a handicapped toilet? No             TIMED UP AND GO:  Was the test performed?  No    Cognitive Function:    01/21/2020    9:59 AM  MMSE - Mini Mental State Exam  Orientation to time 5  Orientation to Place 5  Registration 3  Attention/ Calculation 5  Recall 2  Language- name 2 objects 2  Language- repeat 1  Language- follow 3 step command 3  Language- read & follow direction 1  Write a sentence 1  Copy design 1  Total score 29      02/08/2020   10:00 AM  Montreal Cognitive Assessment   Visuospatial/ Executive (0/5) 4  Naming (0/3) 3  Attention: Read list of digits (0/2) 2  Attention: Read list of letters (0/1) 1  Attention: Serial 7 subtraction starting at 100 (0/3) 3  Language: Repeat phrase (0/2) 1  Language : Fluency (0/1) 1  Abstraction (0/2) 0  Delayed Recall (0/5) 3  Orientation (0/6) 6  Total 24  Adjusted Score (based on education) 25      10/03/2022   10:21 AM 12/13/2020   11:17 AM 03/08/2020    1:11 PM 01/21/2020    9:58 AM  6CIT Screen  What Year? 0 points 0 points 0 points 0 points  What month? 0 points 0  points 0 points 0 points  What time? 0 points 0 points 0 points 0 points  Count back from 20 0 points 0 points 0 points 0 points  Months in reverse 0 points 0 points 0 points 0 points  Repeat phrase 0 points 2 points 2 points 0 points  Total Score 0 points 2 points 2 points 0 points    Immunizations Immunization History  Administered Date(s) Administered   PFIZER(Purple Top)SARS-COV-2 Vaccination 05/11/2019, 06/01/2019, 12/28/2019   Td 03/18/2006   Tdap 11/13/2016    TDAP status: Up to date  Flu Vaccine status: Declined, Education has been provided regarding the importance of this vaccine but patient still declined. Advised may receive this vaccine at local pharmacy  or Health Dept. Aware to provide a copy of the vaccination record if obtained from local pharmacy or Health Dept. Verbalized acceptance and understanding.  Pneumococcal vaccine status: Declined,  Education has been provided regarding the importance of this vaccine but patient still declined. Advised may receive this vaccine at local pharmacy or Health Dept. Aware to provide a copy of the vaccination record if obtained from local pharmacy or Health Dept. Verbalized acceptance and understanding.   Covid-19 vaccine status: Completed vaccines  Qualifies for Shingles Vaccine? Yes   Zostavax completed No   Shingrix Completed?: No.    Education has been provided regarding the importance of this vaccine. Patient has been advised to call insurance company to determine out of pocket expense if they have not yet received this vaccine. Advised may also receive vaccine at local pharmacy or Health Dept. Verbalized acceptance and understanding.  Screening Tests Health Maintenance  Topic Date Due   COVID-19 Vaccine (4 - 2023-24 season) 11/16/2021   COLON CANCER SCREENING ANNUAL FOBT  05/03/2022   Pneumonia Vaccine 53+ Years old (1 of 1 - PCV) 08/14/2028 (Originally 02/08/2018)   Hepatitis C Screening  11/11/2028 (Originally 02/09/1969)    Zoster Vaccines- Shingrix (1 of 2) 11/14/2028 (Originally 02/09/1970)   INFLUENZA VACCINE  10/17/2022   Medicare Annual Wellness (AWV)  10/03/2023   Colonoscopy  02/04/2026   DTaP/Tdap/Td (3 - Td or Tdap) 11/14/2026   HPV VACCINES  Aged Out    Health Maintenance  Health Maintenance Due  Topic Date Due   COVID-19 Vaccine (4 - 2023-24 season) 11/16/2021   COLON CANCER SCREENING ANNUAL FOBT  05/03/2022    Colorectal cancer screening: Type of screening: Colonoscopy. Completed 09/10/21. Repeat every 3 years  Lung Cancer Screening: (Low Dose CT Chest recommended if Age 28-80 years, 20 pack-year currently smoking OR have quit w/in 15years.) does not qualify.    Additional Screening:  Hepatitis C Screening: does qualify; Completed 11/11/16  Vision Screening: Recommended annual ophthalmology exams for early detection of glaucoma and other disorders of the eye. Is the patient up to date with their annual eye exam?  Yes  Who is the provider or what is the name of the office in which the patient attends annual eye exams? [facility] If pt is not established with a provider, would they like to be referred to a provider to establish care? No .   Dental Screening: Recommended annual dental exams for proper oral hygiene    Community Resource Referral / Chronic Care Management:  CRR required this visit?  No   CCM required this visit?  No     Plan:     I have personally reviewed and noted the following in the patient's chart:   Medical and social history Use of alcohol, tobacco or illicit drugs  Current medications and supplements including opioid prescriptions. Patient is not currently taking opioid prescriptions. Functional ability and status Nutritional status Physical activity Advanced directives List of other physicians Hospitalizations, surgeries, and ER visits in previous 12 months Vitals Screenings to include cognitive, depression, and falls Referrals and  appointments  In addition, I have reviewed and discussed with patient certain preventive protocols, quality metrics, and best practice recommendations. A written personalized care plan for preventive services as well as general preventive health recommendations were provided to patient.     Tillie Rung, LPN   08/17/930   After Visit Summary: (Mail) Due to this being a telephonic visit, the after visit summary with patients personalized plan  was offered to patient via mail   Nurse Notes: None

## 2022-10-03 NOTE — Patient Instructions (Addendum)
George [name] , Thank you for taking time to come for your Medicare Wellness Visit. I appreciate your ongoing commitment to your health goals. Please review the following plan we discussed and let me know if I can assist you in the future.   These are the goals we discussed:  Goals       Stay Healthy (pt-stated)        This is a list of the screening recommended for you and due dates:  Health Maintenance  Topic Date Due   COVID-19 Vaccine (4 - 2023-24 season) 11/16/2021   Stool Blood Test  05/03/2022   Pneumonia Vaccine (1 of 1 - PCV) 08/14/2028*   Hepatitis C Screening  11/11/2028*   Zoster (Shingles) Vaccine (1 of 2) 11/14/2028*   Flu Shot  10/17/2022   Medicare Annual Wellness Visit  10/03/2023   Colon Cancer Screening  02/04/2026   DTaP/Tdap/Td vaccine (3 - Td or Tdap) 11/14/2026   HPV Vaccine  Aged Out  *Topic was postponed. The date shown is not the original due date.    Advanced directives: Advance directive discussed with you today. Even though you declined this today, please call our office should you change your mind, and we can give you the proper paperwork for you to fill out.   Conditions/risks identified: None  Next appointment: Follow up in one year for your annual wellness visit.   Preventive Care 17 Years and Older, Male  Preventive care refers to lifestyle choices and visits with your health care provider that can promote health and wellness. What does preventive care include? A yearly physical exam. This is also called an annual well check. Dental exams once or twice a year. Routine eye exams. Ask your health care provider how often you should have your eyes checked. Personal lifestyle choices, including: Daily care of your teeth and gums. Regular physical activity. Eating a healthy diet. Avoiding tobacco and drug use. Limiting alcohol use. Practicing safe sex. Taking low doses of aspirin every day. Taking vitamin and mineral supplements as recommended by  your health care provider. What happens during an annual well check? The services and screenings done by your health care provider during your annual well check will depend on your age, overall health, lifestyle risk factors, and family history of disease. Counseling  Your health care provider may ask you questions about your: Alcohol use. Tobacco use. Drug use. Emotional well-being. Home and relationship well-being. Sexual activity. Eating habits. History of falls. Memory and ability to understand (cognition). Work and work Astronomer. Screening  You may have the following tests or measurements: Height, weight, and BMI. Blood pressure. Lipid and cholesterol levels. These may be checked every 5 years, or more frequently if you are over 47 years old. Skin check. Lung cancer screening. You may have this screening every year starting at age 3 if you have a 30-pack-year history of smoking and currently smoke or have quit within the past 15 years. Fecal occult blood test (FOBT) of the stool. You may have this test every year starting at age 39. Flexible sigmoidoscopy or colonoscopy. You may have a sigmoidoscopy every 5 years or a colonoscopy every 10 years starting at age 41. Prostate cancer screening. Recommendations will vary depending on your family history and other risks. Hepatitis C blood test. Hepatitis B blood test. Sexually transmitted disease (STD) testing. Diabetes screening. This is done by checking your blood sugar (glucose) after you have not eaten for a while (fasting). You may have this done  every 1-3 years. Abdominal aortic aneurysm (AAA) screening. You may need this if you are a current or former smoker. Osteoporosis. You may be screened starting at age 87 if you are at high risk. Talk with your health care provider about your test results, treatment options, and if necessary, the need for more tests. Vaccines  Your health care provider may recommend certain vaccines,  such as: Influenza vaccine. This is recommended every year. Tetanus, diphtheria, and acellular pertussis (Tdap, Td) vaccine. You may need a Td booster every 10 years. Zoster vaccine. You may need this after age 80. Pneumococcal 13-valent conjugate (PCV13) vaccine. One dose is recommended after age 34. Pneumococcal polysaccharide (PPSV23) vaccine. One dose is recommended after age 40. Talk to your health care provider about which screenings and vaccines you need and how often you need them. This information is not intended to replace advice given to you by your health care provider. Make sure you discuss any questions you have with your health care provider. Document Released: 03/31/2015 Document Revised: 11/22/2015 Document Reviewed: 01/03/2015 Elsevier Interactive Patient Education  2017 [employer].  Fall Prevention in the Home Falls can cause injuries. They can happen to people of all ages. There are many things you can do to make your home safe and to help prevent falls. What can I do on the outside of my home? Regularly fix the edges of walkways and driveways and fix any cracks. Remove anything that might make you trip as you walk through a door, such as a raised step or threshold. Trim any bushes or trees on the path to your home. Use bright outdoor lighting. Clear any walking paths of anything that might make someone trip, such as rocks or tools. Regularly check to see if handrails are loose or broken. Make sure that both sides of any steps have handrails. Any raised decks and porches should have guardrails on the edges. Have any leaves, snow, or ice cleared regularly. Use sand or salt on walking paths during winter. Clean up any spills in your garage right away. This includes oil or grease spills. What can I do in the bathroom? Use night lights. Install grab bars by the toilet and in the tub and shower. Do not use towel bars as grab bars. Use non-skid mats or decals in the tub or  shower. If you need to sit down in the shower, use a plastic, non-slip stool. Keep the floor dry. Clean up any water that spills on the floor as soon as it happens. Remove soap buildup in the tub or shower regularly. Attach bath mats securely with double-sided non-slip rug tape. Do not have throw rugs and other things on the floor that can make you trip. What can I do in the bedroom? Use night lights. Make sure that you have a light by your bed that is easy to reach. Do not use any sheets or blankets that are too big for your bed. They should not hang down onto the floor. Have a firm chair that has side arms. You can use this for support while you get dressed. Do not have throw rugs and other things on the floor that can make you trip. What can I do in the kitchen? Clean up any spills right away. Avoid walking on wet floors. Keep items that you use a lot in easy-to-reach places. If you need to reach something above you, use a strong step stool that has a grab bar. Keep electrical cords out of the  way. Do not use floor polish or wax that makes floors slippery. If you must use wax, use non-skid floor wax. Do not have throw rugs and other things on the floor that can make you trip. What can I do with my stairs? Do not leave any items on the stairs. Make sure that there are handrails on both sides of the stairs and use them. Fix handrails that are broken or loose. Make sure that handrails are as long as the stairways. Check any carpeting to make sure that it is firmly attached to the stairs. Fix any carpet that is loose or worn. Avoid having throw rugs at the top or bottom of the stairs. If you do have throw rugs, attach them to the floor with carpet tape. Make sure that you have a light switch at the top of the stairs and the bottom of the stairs. If you do not have them, ask someone to add them for you. What else can I do to help prevent falls? Wear shoes that: Do not have high heels. Have  rubber bottoms. Are comfortable and fit you well. Are closed at the toe. Do not wear sandals. If you use a stepladder: Make sure that it is fully opened. Do not climb a closed stepladder. Make sure that both sides of the stepladder are locked into place. Ask someone to hold it for you, if possible. Clearly mark and make sure that you can see: Any grab bars or handrails. First and last steps. Where the edge of each step is. Use tools that help you move around (mobility aids) if they are needed. These include: Canes. Walkers. Scooters. Crutches. Turn on the lights when you go into a dark area. Replace any light bulbs as soon as they burn out. Set up your furniture so you have a clear path. Avoid moving your furniture around. If any of your floors are uneven, fix them. If there are any pets around you, be aware of where they are. Review your medicines with your doctor. Some medicines can make you feel dizzy. This can increase your chance of falling. Ask your doctor what other things that you can do to help prevent falls. This information is not intended to replace advice given to you by your health care provider. Make sure you discuss any questions you have with your health care provider. Document Released: 12/29/2008 Document Revised: 08/10/2015 Document Reviewed: 04/08/2014 Elsevier Interactive Patient Education  2017 [employer].

## 2022-10-23 ENCOUNTER — Ambulatory Visit (INDEPENDENT_AMBULATORY_CARE_PROVIDER_SITE_OTHER): Payer: Medicare HMO

## 2022-10-23 DIAGNOSIS — I442 Atrioventricular block, complete: Secondary | ICD-10-CM | POA: Diagnosis not present

## 2022-10-23 DIAGNOSIS — I428 Other cardiomyopathies: Secondary | ICD-10-CM

## 2022-10-23 LAB — CUP PACEART REMOTE DEVICE CHECK
Battery Remaining Longevity: 25 mo
Battery Voltage: 2.88 V
Brady Statistic AP VP Percent: 28.8 %
Brady Statistic AP VS Percent: 0.11 %
Brady Statistic AS VP Percent: 68.61 %
Brady Statistic AS VS Percent: 2.49 %
Brady Statistic RA Percent Paced: 30.48 %
Brady Statistic RV Percent Paced: 97.41 %
Date Time Interrogation Session: 20240807081235
Implantable Lead Connection Status: 753985
Implantable Lead Connection Status: 753985
Implantable Lead Connection Status: 753985
Implantable Lead Implant Date: 20170802
Implantable Lead Implant Date: 20170802
Implantable Lead Implant Date: 20170802
Implantable Lead Location: 753858
Implantable Lead Location: 753859
Implantable Lead Location: 753860
Implantable Lead Model: 5076
Implantable Lead Model: 5076
Implantable Pulse Generator Implant Date: 20170802
Lead Channel Impedance Value: 1045 Ohm
Lead Channel Impedance Value: 1083 Ohm
Lead Channel Impedance Value: 342 Ohm
Lead Channel Impedance Value: 418 Ohm
Lead Channel Impedance Value: 437 Ohm
Lead Channel Impedance Value: 456 Ohm
Lead Channel Impedance Value: 494 Ohm
Lead Channel Impedance Value: 532 Ohm
Lead Channel Impedance Value: 532 Ohm
Lead Channel Impedance Value: 665 Ohm
Lead Channel Impedance Value: 779 Ohm
Lead Channel Impedance Value: 817 Ohm
Lead Channel Impedance Value: 931 Ohm
Lead Channel Impedance Value: 969 Ohm
Lead Channel Pacing Threshold Amplitude: 0.625 V
Lead Channel Pacing Threshold Amplitude: 1.375 V
Lead Channel Pacing Threshold Amplitude: 1.375 V
Lead Channel Pacing Threshold Pulse Width: 0.4 ms
Lead Channel Pacing Threshold Pulse Width: 0.4 ms
Lead Channel Pacing Threshold Pulse Width: 0.8 ms
Lead Channel Sensing Intrinsic Amplitude: 15.25 mV
Lead Channel Sensing Intrinsic Amplitude: 15.25 mV
Lead Channel Sensing Intrinsic Amplitude: 2 mV
Lead Channel Sensing Intrinsic Amplitude: 2 mV
Lead Channel Setting Pacing Amplitude: 1.5 V
Lead Channel Setting Pacing Amplitude: 2 V
Lead Channel Setting Pacing Amplitude: 2.75 V
Lead Channel Setting Pacing Pulse Width: 0.4 ms
Lead Channel Setting Pacing Pulse Width: 0.8 ms
Lead Channel Setting Sensing Sensitivity: 2.8 mV
Zone Setting Status: 755011
Zone Setting Status: 755011

## 2022-11-01 ENCOUNTER — Telehealth: Payer: Self-pay | Admitting: *Deleted

## 2022-11-01 NOTE — Telephone Encounter (Signed)
   Name: George [name]  DOB: [birth year]  MRN: [id number]  Primary Cardiologist: Sherryl Manges, MD   Preoperative team, please contact this patient and set up a phone call appointment for further preoperative risk assessment. Please obtain consent and complete medication review. Thank you for your help.  I confirm that guidance regarding antiplatelet and oral anticoagulation therapy has been completed and, if necessary, noted below.  Per office protocol, patient can hold Eliquis for 2 days prior to procedure.   Patient will not need bridging with Lovenox (enoxaparin) around procedure.   Ronney Asters, NP 11/01/2022, 2:27 PM [facility]

## 2022-11-01 NOTE — Telephone Encounter (Signed)
Pt has appt 11/15/22 with Francis Dowse, PAC. I will update all parties involved. Ok per Edd Fabian, FNP to defer clearance to appt with PAC on 11/15/22.

## 2022-11-01 NOTE — Telephone Encounter (Signed)
   Pre-operative Risk Assessment    Patient Name: George [name]  DOB: 20-Dec-1950 MRN: [id number]      Request for Surgical Clearance    Procedure:   HYDROCELECTOMY  Date of Surgery:  Clearance 12/31/22                                 Surgeon:  DR. Floyde Parkins Surgeon's Group or Practice Name:  [facility] Phone number:  [phone] Fax number:  [phone]   Type of Clearance Requested:   - Medical  - Pharmacy:  Hold Apixaban (Eliquis)     Type of Anesthesia:  Not Indicated   Additional requests/questions:    Elpidio Anis   11/01/2022, 10:46 AM

## 2022-11-01 NOTE — Telephone Encounter (Signed)
Patient with diagnosis of atrial fibrillation on Eliquis for anticoagulation.    Procedure:   HYDROCELECTOMY   Date of Surgery:  Clearance 12/31/22   CHA2DS2-VASc Score = 3   This indicates a 3.2% annual risk of stroke. The patient's score is based upon: CHF History: 0 HTN History: 1 Diabetes History: 0 Stroke History: 0 Vascular Disease History: 1 Age Score: 1 Gender Score: 0    CrCl 75 Platelet count 191  Per office protocol, patient can hold Eliquis for 2 days prior to procedure.   Patient will not need bridging with Lovenox (enoxaparin) around procedure.  **This guidance is not considered finalized until pre-operative APP has relayed final recommendations.**

## 2022-11-06 DIAGNOSIS — G4733 Obstructive sleep apnea (adult) (pediatric): Secondary | ICD-10-CM | POA: Diagnosis not present

## 2022-11-07 NOTE — Progress Notes (Signed)
Remote pacemaker transmission.   

## 2022-11-14 NOTE — Progress Notes (Signed)
Electrophysiology Office Note:   Date:  11/15/2022  ID:  George [name], DOB 09/19/1950, MRN [id number]  Primary Cardiologist: Sherryl Manges, MD Electrophysiologist: Sherryl Manges, MD      History of Present Illness:   George [name] is a 72 y.o. male with h/o HTN, HLD, CAD, AT, persistent AF, VT, RBBB, CHB/ICM s/p CRT-P  seen for routine electrophysiology followup.  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   Seen in tele-health EP Clinic by Dr. Graciela Husbands 03/2022 > at that time he reported in June of 2023 while in [place], he had episodes of SOB & lightheadedness not associated with AF on device.  Thought related to heat / volume depletion.   Last remote device check 10/23/22 was abnormal with slow non-sustained VT (1 VT detection lasting 18 beats, with half the beats falling below detection zone).   Since last being seen in our clinic the patient reports doing ok - has had lots of issues with his feet in terms of peripheral neuropathy.  Is able to continue to do physical activity as he chooses, no symptoms with exertion or chest pain.  Wife notes he continues to have frequent physical movement at night despite CPAP compliance.   He denies chest pain, palpitations, dyspnea, PND, orthopnea, nausea, vomiting, dizziness, syncope, edema, weight gain, or early satiety.   Review of systems complete and found to be negative unless listed in HPI.    EP Information / Studies Reviewed:    EKG is ordered today. Personal review as below.  EKG Interpretation Date/Time:  Friday November 15 2022 09:01:43 EDT Ventricular Rate:  59 PR Interval:  164 QRS Duration:  152 QT Interval:  518 QTC Calculation: 512 R Axis:   239  Text Interpretation: AV dual-paced rhythm Stable QTc on dofetilide Confirmed by Canary Brim ([id number]) on 11/15/2022 9:20:30 AM   PPM Interrogation-  reviewed in detail today,  See PACEART report.  Device History Medtronic BiV PPM implanted 10/18/2015 for CHB, Cardiomyopathy  Studies 2019 LHC >  non-obs CAD  ECHO 05/2020 > LVEF 50-55%  Arrhythmia / AAD Hx  AF - detected 07/2020 on device 2022 Started on Dofetilide   Risk Assessment/Calculations:    CHA2DS2-VASc Score = 3   This indicates a 3.2% annual risk of stroke. The patient's score is based upon: CHF History: 0 HTN History: 1 Diabetes History: 0 Stroke History: 0 Vascular Disease History: 1 Age Score: 1 Gender Score: 0             Physical Exam:   VS:  BP 118/78 (BP Location: Right Arm, Patient Position: Sitting, Cuff Size: Normal)   Pulse (!) 59   Ht 5' 8.5" (1.74 m)   Wt 167 lb 3.2 oz (75.8 kg)   SpO2 97%   BMI 25.05 kg/m    Wt Readings from Last 3 Encounters:  11/15/22 167 lb 3.2 oz (75.8 kg)  10/03/22 165 lb (74.8 kg)  10/02/22 165 lb (74.8 kg)     GEN: Well nourished, well developed in no acute distress NECK: No JVD; No carotid bruits CARDIAC: Regular rate and rhythm, no murmurs, rubs, gallops RESPIRATORY:  Clear to auscultation without rales, wheezing or rhonchi  ABDOMEN: Soft, non-tender, non-distended EXTREMITIES:  No edema; No deformity   ASSESSMENT AND PLAN:    CHB s/p Medtronic PPM / CRT-P SND  Cardiomyopathy / NICM  -normal PPM function -see Pace Art report -given episode of of VT below detection zone, VT monitor interval lowered from 150bpm to 143bpm -changed  RV pulse width to 0.78ms (from 0.4).  RV threshold 1.25V and 0.8 ms, safety margin changed as well for 2:1  Persistent AF CHA2DS2-VASc 3 / 3.2% annual risk of stroke -continue Eliquis 5mg  BID, appropriate by age/wt  -continue dofetilide  -EKG with stable intervals   -update labs > BMP, Mg+   Secondary Hypercoagulable State  -continue eliquis as above  -CBC with OAC   Atrial Tachycardia   HTN  -well controlled on carvedilol, losartan, spiro + lasix   OSA on CPAP  -compliant   Surgical Clearance  -Date of Procedure 12/31/22  -Procedure: Hydrocelect[facility] by Dr. Floyde Parkins at [facility] -per prior note / office protocol,  pt can hold eliquis for 2 days prior to procedure, does not need bridging with lovenox  -low cardiac risk procedure with RCRI of 6.6% perioperative risk of major cardiac event -can establish 4 METS or better without symptoms, no sx to suggest need for cardiac work up  Disposition:   Follow up with EP APP  4 months    Signed, Canary Brim, MSN, APRN, NP-C, AGACNP-BC [facility] - Electrophysiology  11/15/2022, 10:26 AM

## 2022-11-15 ENCOUNTER — Encounter: Payer: Self-pay | Admitting: Pulmonary Disease

## 2022-11-15 ENCOUNTER — Ambulatory Visit: Payer: Medicare HMO | Attending: Physician Assistant | Admitting: Pulmonary Disease

## 2022-11-15 VITALS — BP 118/78 | HR 59 | Ht 68.5 in | Wt 167.2 lb

## 2022-11-15 DIAGNOSIS — I428 Other cardiomyopathies: Secondary | ICD-10-CM

## 2022-11-15 DIAGNOSIS — I4819 Other persistent atrial fibrillation: Secondary | ICD-10-CM | POA: Diagnosis not present

## 2022-11-15 DIAGNOSIS — Z5181 Encounter for therapeutic drug level monitoring: Secondary | ICD-10-CM | POA: Diagnosis not present

## 2022-11-15 DIAGNOSIS — Z79899 Other long term (current) drug therapy: Secondary | ICD-10-CM

## 2022-11-15 DIAGNOSIS — Z950 Presence of cardiac pacemaker: Secondary | ICD-10-CM | POA: Diagnosis not present

## 2022-11-15 DIAGNOSIS — I442 Atrioventricular block, complete: Secondary | ICD-10-CM

## 2022-11-15 LAB — CUP PACEART INCLINIC DEVICE CHECK
Battery Remaining Longevity: 24 mo
Battery Voltage: 2.88 V
Brady Statistic AP VP Percent: 29.86 %
Brady Statistic AP VS Percent: 0.11 %
Brady Statistic AS VP Percent: 68.71 %
Brady Statistic AS VS Percent: 1.32 %
Brady Statistic RA Percent Paced: 30.64 %
Brady Statistic RV Percent Paced: 98.56 %
Date Time Interrogation Session: 20240830113814
Implantable Lead Connection Status: 753985
Implantable Lead Connection Status: 753985
Implantable Lead Connection Status: 753985
Implantable Lead Implant Date: 20170802
Implantable Lead Implant Date: 20170802
Implantable Lead Implant Date: 20170802
Implantable Lead Location: 753858
Implantable Lead Location: 753859
Implantable Lead Location: 753860
Implantable Lead Model: 5076
Implantable Lead Model: 5076
Implantable Pulse Generator Implant Date: 20170802
Lead Channel Impedance Value: 1064 Ohm
Lead Channel Impedance Value: 1083 Ohm
Lead Channel Impedance Value: 380 Ohm
Lead Channel Impedance Value: 456 Ohm
Lead Channel Impedance Value: 475 Ohm
Lead Channel Impedance Value: 532 Ohm
Lead Channel Impedance Value: 532 Ohm
Lead Channel Impedance Value: 551 Ohm
Lead Channel Impedance Value: 551 Ohm
Lead Channel Impedance Value: 684 Ohm
Lead Channel Impedance Value: 798 Ohm
Lead Channel Impedance Value: 836 Ohm
Lead Channel Impedance Value: 950 Ohm
Lead Channel Impedance Value: 988 Ohm
Lead Channel Pacing Threshold Amplitude: 0.625 V
Lead Channel Pacing Threshold Amplitude: 1.25 V
Lead Channel Pacing Threshold Amplitude: 1.375 V
Lead Channel Pacing Threshold Pulse Width: 0.4 ms
Lead Channel Pacing Threshold Pulse Width: 0.4 ms
Lead Channel Pacing Threshold Pulse Width: 0.8 ms
Lead Channel Sensing Intrinsic Amplitude: 1.625 mV
Lead Channel Sensing Intrinsic Amplitude: 15.25 mV
Lead Channel Sensing Intrinsic Amplitude: 15.25 mV
Lead Channel Sensing Intrinsic Amplitude: 2.5 mV
Lead Channel Setting Pacing Amplitude: 1.5 V
Lead Channel Setting Pacing Amplitude: 2 V
Lead Channel Setting Pacing Amplitude: 2.5 V
Lead Channel Setting Pacing Pulse Width: 0.8 ms
Lead Channel Setting Pacing Pulse Width: 0.8 ms
Lead Channel Setting Sensing Sensitivity: 2.8 mV
Zone Setting Status: 755011
Zone Setting Status: 755011

## 2022-11-15 NOTE — Patient Instructions (Addendum)
Medication Instructions:   Your physician recommends that you continue on your current medications as directed. Please refer to the Current Medication list given to you today.   *If you need a refill on your cardiac medications before your next appointment, please call your pharmacy*   Lab Work: BMET MAG AND CBC TODAY    If you have labs (blood work) drawn today and your tests are completely normal, you will receive your results only by: MyChart Message (if you have MyChart) OR A paper copy in the mail If you have any lab test that is abnormal or we need to change your treatment, we will call you to review the results.   Testing/Procedures: NONE ORDERED  TODAY      Follow-Up: At [facility], you and your health needs are our priority.  As part of our continuing mission to provide you with exceptional heart care, we have created designated Provider Care Teams.  These Care Teams include your primary Cardiologist (physician) and Advanced Practice Providers (APPs -  Physician Assistants and Nurse Practitioners) who all work together to provide you with the care you need, when you need it.  We recommend signing up for the patient portal called "MyChart".  Sign up information is provided on this After Visit Summary.  MyChart is used to connect with patients for Virtual Visits (Telemedicine).  Patients are able to view lab/test results, encounter notes, upcoming appointments, etc.  Non-urgent messages can be sent to your provider as well.   To learn more about what you can do with MyChart, go to [email or web address].    Your next appointment:   4-6 week(s)  Provider:   Baldwin Crown" [place], PA-C, Francis Dowse, PA-C, or Canary Brim, NP     Other Instructions

## 2022-11-16 LAB — BASIC METABOLIC PANEL
BUN/Creatinine Ratio: 13 (ref 10–24)
BUN: 12 mg/dL (ref 8–27)
CO2: 27 mmol/L (ref 20–29)
Calcium: 10 mg/dL (ref 8.6–10.2)
Chloride: 99 mmol/L (ref 96–106)
Creatinine, Ser: 0.96 mg/dL (ref 0.76–1.27)
Glucose: 97 mg/dL (ref 70–99)
Potassium: 4.5 mmol/L (ref 3.5–5.2)
Sodium: 138 mmol/L (ref 134–144)
eGFR: 86 mL/min/{1.73_m2} (ref >59)

## 2022-11-16 LAB — CBC
Hematocrit: 40.3 % (ref 37.5–51.0)
Hemoglobin: 13.6 g/dL (ref 13.0–17.7)
MCH: 31.1 pg (ref 26.6–33.0)
MCHC: 33.7 g/dL (ref 31.5–35.7)
MCV: 92 fL (ref 79–97)
Platelets: 185 10*3/uL (ref 150–450)
RBC: 4.38 x10E6/uL (ref 4.14–5.80)
RDW: 12.5 % (ref 11.6–15.4)
WBC: 6.8 10*3/uL (ref 3.4–10.8)

## 2022-11-16 LAB — MAGNESIUM: Magnesium: 2 mg/dL (ref 1.6–2.3)

## 2022-12-02 ENCOUNTER — Encounter: Payer: Self-pay | Admitting: Family Medicine

## 2022-12-02 ENCOUNTER — Ambulatory Visit (INDEPENDENT_AMBULATORY_CARE_PROVIDER_SITE_OTHER): Payer: Medicare HMO | Admitting: Family Medicine

## 2022-12-02 VITALS — BP 108/73 | HR 79 | Resp 18 | Ht 68.5 in | Wt 168.0 lb

## 2022-12-02 DIAGNOSIS — E785 Hyperlipidemia, unspecified: Secondary | ICD-10-CM

## 2022-12-02 DIAGNOSIS — I10 Essential (primary) hypertension: Secondary | ICD-10-CM

## 2022-12-02 DIAGNOSIS — G4733 Obstructive sleep apnea (adult) (pediatric): Secondary | ICD-10-CM | POA: Diagnosis not present

## 2022-12-02 MED ORDER — CLOTRIMAZOLE-BETAMETHASONE 1-0.05 % EX CREA: 1.0000 | TOPICAL_CREAM | CUTANEOUS | 11 refills | Status: DC | PRN

## 2022-12-02 NOTE — Assessment & Plan Note (Signed)
Stable, blood pressure at goal <130/80.  Continue carvedilol 25 mg twice daily, Lasix 20 mg daily, losartan 12.5 mg daily, spironolactone 25 mg daily.  CMP essentially within normal limits, total bilirubin slightly elevated 4 months ago consistent with hereditary hyperbilirubinemia.  Will continue to monitor.

## 2022-12-02 NOTE — Assessment & Plan Note (Signed)
Most recent lipid panel: LDL 91, HDL 49, triglycerides 90.  Continue atorvastatin 20 mg daily.  Will continue to monitor.

## 2022-12-02 NOTE — Assessment & Plan Note (Signed)
Stable.  Continue regular use of CPAP machine.

## 2022-12-02 NOTE — Patient Instructions (Signed)
After age 71, there is a decline in how strong your immune system is, this is where we start seeing an increase in complications from illnesses such as pneumonia that the body may have previously been able to fight off.  The trends indicate that after age 51, there is a much higher risk of needing to go to the hospital due to pneumonia.  This is the reason that routine protection from vaccines like the pneumonia vaccine and the shingles vaccine are recommended for all adults at certain ages.  I have included some information for you to learn more.  If you decide to get either of them, you can find them at your local pharmacy!

## 2022-12-02 NOTE — Progress Notes (Signed)
Established Patient Office Visit  Subjective   Patient ID: ANSH BAITY, male    DOB: 11/14/1950  Age: 72 y.o. MRN: [id number]  Chief Complaint  Patient presents with   Hyperlipidemia   Hypertension   Sleep Apnea    HPI George [name] is a 72 y.o. male presenting today for follow up of hypertension, hyperlipidemia, sleep apnea. Hypertension: Patient here for follow-up of elevated blood pressure. He is exercising and is adherent to low salt diet.   Pt denies chest pain, SOB, dizziness, edema, syncope, fatigue or heart palpitations. Taking carvedilol, furosemide, losartan, aspirin Ectosone, reports excellent compliance with treatment. Denies side effects. Hyperlipidemia: tolerating atorvastatin well with no myalgias or significant side effects.  The 10-year ASCVD risk score (Arnett DK, et al., 2019) is: 13.1% Sleep apnea: Uses CPAP nightly.  Outpatient Medications Prior to Visit  Medication Sig   apixaban (ELIQUIS) 5 MG TABS tablet Take 1 tablet (5 mg total) by mouth 2 (two) times daily.   atorvastatin (LIPITOR) 20 MG tablet Take 1 tablet by mouth 2 times per week.   carvedilol (COREG) 25 MG tablet Take 1 tablet (25 mg total) by mouth 2 (two) times daily with a meal.   dofetilide (TIKOSYN) 500 MCG capsule TAKE ONE CAPSULE BY MOUTH TWICE A DAY   DULoxetine (CYMBALTA) 60 MG capsule Take 1 capsule (60 mg total) by mouth at bedtime.   fexofenadine (ALLEGRA) 180 MG tablet TAKE 1 TABLET (180 MG TOTAL) BY MOUTH DAILY. DURING ALLERGY SEASON   fluticasone (FLONASE) 50 MCG/ACT nasal spray USE 1 SPRAY IN EACH NOSTRIL AFTER SINUS RINSES TWICE DAILY. (Patient taking differently: prn)   furosemide (LASIX) 20 MG tablet TAKE 1 TABLET BY MOUTH EVERY DAY   hydrocortisone (ANUSOL-HC) 2.5 % rectal cream Place 1 Application rectally 2 (two) times daily.   losartan (COZAAR) 25 MG tablet TAKE 1/2 TABLET BY MOUTH EVERY DAY   Multiple Vitamins-Minerals (CENTRUM ADULTS) TABS Take 1 tablet by mouth daily.    spironolactone (ALDACTONE) 25 MG tablet TAKE 1 TABLET (25 MG TOTAL) BY MOUTH DAILY.   tamsulosin (FLOMAX) 0.4 MG CAPS capsule Take 0.4 mg by mouth daily.   [DISCONTINUED] Cholecalciferol (VITAMIN D3) 5000 units TABS 5,000 IU OTC vitamin D3 daily.   [DISCONTINUED] clotrimazole-betamethasone (LOTRISONE) cream Apply 1 Application topically as needed (for skin).   No facility-administered medications prior to visit.    ROS Negative unless otherwise noted in HPI   Objective:     BP 108/73 (BP Location: Left Arm, Patient Position: Sitting, Cuff Size: Normal)   Pulse 79   Resp 18   Ht 5' 8.5" (1.74 m)   Wt 168 lb (76.2 kg)   SpO2 97%   BMI 25.17 kg/m   Physical Exam Constitutional:      General: He is not in acute distress.    Appearance: Normal appearance.  HENT:     Head: Normocephalic and atraumatic.  Cardiovascular:     Rate and Rhythm: Normal rate and regular rhythm.     Heart sounds: Normal heart sounds. No murmur heard.    No friction rub. No gallop.  Pulmonary:     Effort: Pulmonary effort is normal. No respiratory distress.     Breath sounds: Normal breath sounds. No wheezing, rhonchi or rales.  Skin:    General: Skin is warm and dry.  Neurological:     Mental Status: He is alert and oriented to person, place, and time.  Psychiatric:  Mood and Affect: Mood normal.     Assessment & Plan:  Hyperlipidemia LDL goal <100 Assessment & Plan: Most recent lipid panel: LDL 91, HDL 49, triglycerides 90.  Continue atorvastatin 20 mg daily.  Will continue to monitor.   Essential hypertension Assessment & Plan: Stable, blood pressure at goal <130/80.  Continue carvedilol 25 mg twice daily, Lasix 20 mg daily, losartan 12.5 mg daily, spironolactone 25 mg daily.  CMP essentially within normal limits, total bilirubin slightly elevated 4 months ago consistent with hereditary hyperbilirubinemia.  Will continue to monitor.   OSA (obstructive sleep apnea) Assessment &  Plan: Stable.  Continue regular use of CPAP machine.    Other orders -     Clotrimazole-Betamethasone; Apply 1 Application topically as needed (for skin).  Dispense: 30 g; Refill: 11  Blood pressure and cholesterol levels have been very stable for a long time.  Next follow-up will be for annual physical, after that follow-ups will be every 6 months.  Return in about 35 weeks (around 08/04/2023) for annual physical, fasting blood work 1 week before.    Melida Quitter, PA

## 2022-12-05 ENCOUNTER — Encounter: Payer: Self-pay | Admitting: Internal Medicine

## 2022-12-05 DIAGNOSIS — N5089 Other specified disorders of the male genital organs: Secondary | ICD-10-CM | POA: Diagnosis not present

## 2022-12-09 ENCOUNTER — Encounter: Payer: Self-pay | Admitting: Internal Medicine

## 2022-12-20 ENCOUNTER — Ambulatory Visit: Payer: Medicare HMO | Attending: Student | Admitting: Student

## 2022-12-20 ENCOUNTER — Encounter: Payer: Self-pay | Admitting: Student

## 2022-12-20 VITALS — BP 104/70 | HR 77 | Ht 68.5 in | Wt 168.4 lb

## 2022-12-20 DIAGNOSIS — I4729 Other ventricular tachycardia: Secondary | ICD-10-CM

## 2022-12-20 DIAGNOSIS — Z950 Presence of cardiac pacemaker: Secondary | ICD-10-CM | POA: Diagnosis not present

## 2022-12-20 DIAGNOSIS — I442 Atrioventricular block, complete: Secondary | ICD-10-CM | POA: Diagnosis not present

## 2022-12-20 DIAGNOSIS — I4819 Other persistent atrial fibrillation: Secondary | ICD-10-CM | POA: Diagnosis not present

## 2022-12-20 DIAGNOSIS — I428 Other cardiomyopathies: Secondary | ICD-10-CM

## 2022-12-20 NOTE — Patient Instructions (Signed)
Medication Instructions:  Your physician recommends that you continue on your current medications as directed. Please refer to the Current Medication list given to you today.  *If you need a refill on your cardiac medications before your next appointment, please call your pharmacy*  Lab Work: None ordered If you have labs (blood work) drawn today and your tests are completely normal, you will receive your results only by: MyChart Message (if you have MyChart) OR A paper copy in the mail If you have any lab test that is abnormal or we need to change your treatment, we will call you to review the results.  Follow-Up: At [facility], you and your health needs are our priority.  As part of our continuing mission to provide you with exceptional heart care, we have created designated Provider Care Teams.  These Care Teams include your primary Cardiologist (physician) and Advanced Practice Providers (APPs -  Physician Assistants and Nurse Practitioners) who all work together to provide you with the care you need, when you need it.  Your next appointment:   6 month(s)  Provider:   Sherryl Manges, MD or Francis Dowse, PA-C

## 2022-12-20 NOTE — Progress Notes (Signed)
  Electrophysiology Office Note:   ID:  George [name], George [name] Feb 21, 1951, MRN [id number]  Primary Cardiologist: Sherryl Manges, MD Electrophysiologist: Sherryl Manges, MD      History of Present Illness:   George [name] is a 72 y.o. male with h/o HTN, HLD, CAD, AT, persistent AF, VT, RBBB, CHB/ICM s/p CRT-P  seen for routine EP follow up. 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    Seen in tele-health EP Clinic by Dr. Graciela Husbands 03/2022 > at that time he reported in June of 2023 while in [place], he had episodes of SOB & lightheadedness not associated with AF on device.  Thought related to heat / volume depletion.    Last remote device check 10/23/22 was abnormal with slow non-sustained VT (1 VT detection lasting 18 beats, with half the beats falling below detection zone).   Seen 11/15/2022 was overall doing well. Denied SOB/CP  Today, he reports overall is doing very well. Feels rare skipped heart beat but is asymptomatic. He denies symptoms of palpitations, chest pain, shortness of breath, orthopnea, PND, lower extremity edema, claudication, dizziness, presyncope, syncope, bleeding, or neurologic sequela. The patient is tolerating medications without difficulties.    Review of systems complete and found to be negative unless listed in HPI.   EP Information / Studies Reviewed:    EKG is not ordered today. EKG from 11/15/2022 reviewed which showed AV dual paced ~60 bpm       PPM Interrogation-  Brief check only today. No charge, no episodes.   Device History: Medtronic BiV PPM implanted 10/18/2015 for CHB, Cardiomyopathy   Studies 2019 LHC > non-obs CAD  ECHO 05/2020 > LVEF 50-55%   Arrhythmia / AAD Hx  AF - detected 07/2020 on device 2022 Started on Dofetilide   Physical Exam:   VS:  BP 104/70   Pulse 77   Ht 5' 8.5" (1.74 m)   Wt 168 lb 6.4 oz (76.4 kg)   SpO2 99%   BMI 25.23 kg/m    Wt Readings from Last 3 Encounters:  12/20/22 168 lb 6.4 oz (76.4 kg)  12/02/22 168 lb (76.2 kg)  11/15/22  167 lb 3.2 oz (75.8 kg)     GEN: Well nourished, well developed in no acute distress NECK: No JVD; No carotid bruits CARDIAC: Regular rate and rhythm, no murmurs, rubs, gallops RESPIRATORY:  Clear to auscultation without rales, wheezing or rhonchi  ABDOMEN: Soft, non-tender, non-distended EXTREMITIES:  No edema; No deformity   ASSESSMENT AND PLAN:    CHB s/p Medtronic PPM  SND Normal PPM function See Pace Art report No changes today  NICM NSVT Fell below detection at last visit, so VT monitor lowered to 143 bpm.  No further on brief check  Persistent AF Atrial Tachycardia  Continue eliquis 5 mg BID CHA2DS2VASc  of at least 3 Continue dofetilide  -EKG with stable intervals  8/30  Secondary hypercoagulable state Pt on Eliquis as above   HTN Stable on current regimen   OSA  Encouraged nightly CPAP      Disposition:   Follow up with EP APP in 6 months  Signed, Graciella Freer, PA-C

## 2022-12-26 ENCOUNTER — Encounter: Payer: Self-pay | Admitting: Gastroenterology

## 2022-12-31 DIAGNOSIS — G4730 Sleep apnea, unspecified: Secondary | ICD-10-CM | POA: Diagnosis not present

## 2022-12-31 DIAGNOSIS — N433 Hydrocele, unspecified: Secondary | ICD-10-CM | POA: Diagnosis not present

## 2022-12-31 DIAGNOSIS — I4891 Unspecified atrial fibrillation: Secondary | ICD-10-CM | POA: Diagnosis not present

## 2022-12-31 DIAGNOSIS — I10 Essential (primary) hypertension: Secondary | ICD-10-CM | POA: Diagnosis not present

## 2023-01-22 ENCOUNTER — Ambulatory Visit (INDEPENDENT_AMBULATORY_CARE_PROVIDER_SITE_OTHER): Payer: Medicare HMO

## 2023-01-22 DIAGNOSIS — I442 Atrioventricular block, complete: Secondary | ICD-10-CM | POA: Diagnosis not present

## 2023-01-22 LAB — CUP PACEART REMOTE DEVICE CHECK
Battery Remaining Longevity: 20 mo
Battery Voltage: 2.86 V
Brady Statistic AP VP Percent: 27 %
Brady Statistic AP VS Percent: 0.08 %
Brady Statistic AS VP Percent: 70.83 %
Brady Statistic AS VS Percent: 2.1 %
Brady Statistic RA Percent Paced: 28.5 %
Brady Statistic RV Percent Paced: 97.82 %
Date Time Interrogation Session: 20241105231158
Implantable Lead Connection Status: 753985
Implantable Lead Connection Status: 753985
Implantable Lead Connection Status: 753985
Implantable Lead Implant Date: 20170802
Implantable Lead Implant Date: 20170802
Implantable Lead Implant Date: 20170802
Implantable Lead Location: 753858
Implantable Lead Location: 753859
Implantable Lead Location: 753860
Implantable Lead Model: 5076
Implantable Lead Model: 5076
Implantable Pulse Generator Implant Date: 20170802
Lead Channel Impedance Value: 1026 Ohm
Lead Channel Impedance Value: 1045 Ohm
Lead Channel Impedance Value: 342 Ohm
Lead Channel Impedance Value: 418 Ohm
Lead Channel Impedance Value: 456 Ohm
Lead Channel Impedance Value: 456 Ohm
Lead Channel Impedance Value: 494 Ohm
Lead Channel Impedance Value: 532 Ohm
Lead Channel Impedance Value: 532 Ohm
Lead Channel Impedance Value: 665 Ohm
Lead Channel Impedance Value: 798 Ohm
Lead Channel Impedance Value: 817 Ohm
Lead Channel Impedance Value: 950 Ohm
Lead Channel Impedance Value: 969 Ohm
Lead Channel Pacing Threshold Amplitude: 0.625 V
Lead Channel Pacing Threshold Amplitude: 1.5 V
Lead Channel Pacing Threshold Amplitude: 1.5 V
Lead Channel Pacing Threshold Pulse Width: 0.4 ms
Lead Channel Pacing Threshold Pulse Width: 0.4 ms
Lead Channel Pacing Threshold Pulse Width: 0.8 ms
Lead Channel Sensing Intrinsic Amplitude: 15.25 mV
Lead Channel Sensing Intrinsic Amplitude: 15.25 mV
Lead Channel Sensing Intrinsic Amplitude: 2.125 mV
Lead Channel Sensing Intrinsic Amplitude: 2.125 mV
Lead Channel Setting Pacing Amplitude: 1.5 V
Lead Channel Setting Pacing Amplitude: 2 V
Lead Channel Setting Pacing Amplitude: 3 V
Lead Channel Setting Pacing Pulse Width: 0.4 ms
Lead Channel Setting Pacing Pulse Width: 0.8 ms
Lead Channel Setting Sensing Sensitivity: 2.8 mV
Zone Setting Status: 755011
Zone Setting Status: 755011

## 2023-01-30 DIAGNOSIS — N5089 Other specified disorders of the male genital organs: Secondary | ICD-10-CM | POA: Diagnosis not present

## 2023-02-03 ENCOUNTER — Other Ambulatory Visit: Payer: Self-pay | Admitting: Physician Assistant

## 2023-02-04 DIAGNOSIS — G4733 Obstructive sleep apnea (adult) (pediatric): Secondary | ICD-10-CM | POA: Diagnosis not present

## 2023-02-11 NOTE — Progress Notes (Signed)
Remote pacemaker transmission.   

## 2023-02-28 NOTE — Telephone Encounter (Signed)
Copied from CRM [id number]. Topic: Clinical - Medication Refill >> Feb 28, 2023  1:28 PM George [name] wrote: Most Recent Primary Care Visit:  Provider: Saralyn Pilar A  Department: [facility]  Visit Type: FOLLOW UP 20  Date: 12/02/2022  Medication: apixaban (ELIQUIS) 5 MG TABS tablet  Has the patient contacted their pharmacy? Yes- PT is out of re-fills (Agent: If no, request that the patient contact the pharmacy for the refill. If patient does not wish to contact the pharmacy document the reason why and proceed with request.) (Agent: If yes, when and what did the pharmacy advise?)  Is this the correct pharmacy for this prescription? Yes If no, delete pharmacy and type the correct one.  This is the patient's preferred pharmacy:  [id number] - [place], [place] - [address] AT [facility] [address] [place] [address] Phone: [phone] Fax: [phone]   Has the prescription been filled recently? Yes  Is the patient out of the medication? No  Has the patient been seen for an appointment in the last year OR does the patient have an upcoming appointment? Yes  Can we respond through MyChart? Yes  Agent: Please be advised that Rx refills may take up to 3 business days. We ask that you follow-up with your pharmacy.

## 2023-03-06 ENCOUNTER — Encounter: Payer: Self-pay | Admitting: Family Medicine

## 2023-03-11 ENCOUNTER — Ambulatory Visit: Payer: Self-pay | Admitting: Family Medicine

## 2023-03-11 NOTE — Telephone Encounter (Signed)
Copied from CRM [id number]. Topic: Clinical - Red Word Triage >> Mar 11, 2023  8:18 AM Prudencio Pair wrote: Red Word that prompted transfer to Nurse Triage: Wife of pt, Eunice Blase, states pt got up this morning blowing nose & nothing came out but green mucus and blood.   Chief Complaint: sinus congestion  Symptoms: runny nose, sore throat Frequency: ongoing since Saturday worsened over the past 2 days  Pertinent Negatives: Patient denies fever Disposition: [] ED /[x] Urgent Care (no appt availability in office) / [] Appointment(In office/virtual)/ []  [facility]/ [] Home Care/ [] Refused Recommended Disposition /[] [facility]/ []  Follow-up with PCP Additional Notes: The patient reported worsening sinus pressure, a sore throat and a runny nose that has been ongoing since Saturday.  He and his wife have been in contact with hundreds of kids over the past few days.  [facility] [place]  He declined to go to Urgent care as "his doctor's understand his medicines."  Routed to pcp to advise.   Reason for Disposition  [1] Using nasal washes and pain medicine > 24 hours AND [2] sinus pain (lower forehead, cheekbone, or eye) persists  [1] Sinus congestion as part of a cold AND [2] present < 10 days  Answer Assessment - Initial Assessment Questions 1. ONSET: "When did the nasal discharge start?"      Two days  2. AMOUNT: "How much discharge is there?"      Dark Green  3. COUGH: "Do you have a cough?" If Yes, ask: "Describe the color of your sputum" (clear, white, yellow, green)     Sometimes lighter green  4. RESPIRATORY DISTRESS: "Describe your breathing."      none 5. FEVER: "Do you have a fever?" If Yes, ask: "What is your temperature, how was it measured, and when did it start?"     no 6. SEVERITY: "Overall, how bad are you feeling right now?" (e.g., doesn't interfere with normal activities, staying home from school/work, staying in bed)       Feels very tired 7. OTHER SYMPTOMS: "Do you have any other symptoms?" (e.g., sore throat, earache, wheezing, vomiting)     Runny nose sinus pressure  Answer Assessment - Initial Assessment Questions 1. ONSET: "When did the throat start hurting?" (Hours or days ago)      Saturday afternoon  2. SEVERITY: "How bad is the sore throat?" (Scale 1-10; mild, moderate or severe)   - MILD (1-3):  Doesn't interfere with eating or normal activities.   - MODERATE (4-7): Interferes with eating some solids and normal activities.   - SEVERE (8-10):  Excruciating pain, interferes with most normal activities.   - SEVERE WITH DYSPHAGIA (10): Can't swallow liquids, drooling.     6-7/10 3. STREP EXPOSURE: "Has there been any exposure to strep within the past week?" If Yes, ask: "What type of contact occurred?"      Wife had a sinus infection  4.  VIRAL SYMPTOMS: "Are there any symptoms of a cold, such as a runny nose, cough, hoarse voice or red eyes?"      Runny nose 5. FEVER: "Do you have a fever?" If Yes, ask: "What is your temperature, how was it measured, and when did it start?"     No fever  6. PUS ON THE TONSILS: "Is there pus on the tonsils in the back of your throat?"     no 7. OTHER SYMPTOMS: "Do you have any other symptoms?" (  e.g., difficulty breathing, headache, rash)     Runny nose Pressure in his sinus under his eyes  Answer Assessment - Initial Assessment Questions 1. LOCATION: "Where does it hurt?"      Under eyes 2. ONSET: "When did the sinus pain start?"  (e.g., hours, days)      2 days ago  3. SEVERITY: "How bad is the pain?"   (Scale 1-10; mild, moderate or severe)   - MILD (1-3): doesn't interfere with normal activities    - MODERATE (4-7): interferes with normal activities (e.g., work or school) or awakens from sleep   - SEVERE (8-10): excruciating pain and patient unable to do any normal activities        6-7/10 5. NASAL CONGESTION: "Is the nose blocked?" If Yes, ask: "Can you  open it or must you breathe through your mouth?"     No 6. NASAL DISCHARGE: "Do you have discharge from your nose?" If so ask, "What color?"     Dark green nasal discharge 7. FEVER: "Do you have a fever?" If Yes, ask: "What is it, how was it measured, and when did it start?"      No 8. OTHER SYMPTOMS: "Do you have any other symptoms?" (e.g., sore throat, cough, earache, difficulty breathing)     Intermittent productive cough and sore throat  Protocols used: Sore Throat-A-AH, Common Cold-A-AH, Sinus Pain or Congestion-A-AH

## 2023-03-13 NOTE — Telephone Encounter (Signed)
This message was sent to [place] on christmas eve when our offices were closed.  Can you call the patient to see if he would like to schedule an appt? I think I have some openings today and tomorrow.

## 2023-03-13 NOTE — Telephone Encounter (Signed)
Contacted pt and he said he wasn't feeling great offered to have him come in tomorrow for an appt and he said he could not come in tomorrow because he would be working out of town.

## 2023-03-19 ENCOUNTER — Other Ambulatory Visit: Payer: Self-pay | Admitting: Internal Medicine

## 2023-03-24 ENCOUNTER — Encounter: Payer: Self-pay | Admitting: Internal Medicine

## 2023-03-25 ENCOUNTER — Other Ambulatory Visit: Payer: Self-pay | Admitting: *Deleted

## 2023-03-25 DIAGNOSIS — I4819 Other persistent atrial fibrillation: Secondary | ICD-10-CM

## 2023-03-25 MED ORDER — APIXABAN 5 MG PO TABS: 5.0000 mg | ORAL_TABLET | Freq: Two times a day (BID) | ORAL | 1 refills | Status: DC

## 2023-03-25 NOTE — Telephone Encounter (Signed)
 Eliquis 5mg  refill request received. Patient is 73 years old, weight-76.4kg, Crea-0.96 on 11/15/22, Diagnosis-Afib, and last seen by Otilio Saber on 12/20/22. Dose is appropriate based on dosing criteria. Will send in refill to requested pharmacy.

## 2023-04-14 ENCOUNTER — Other Ambulatory Visit: Payer: Self-pay | Admitting: Internal Medicine

## 2023-04-23 ENCOUNTER — Ambulatory Visit: Payer: Medicare HMO | Attending: Internal Medicine

## 2023-04-23 DIAGNOSIS — I442 Atrioventricular block, complete: Secondary | ICD-10-CM

## 2023-04-23 LAB — CUP PACEART REMOTE DEVICE CHECK
Battery Remaining Longevity: 16 mo
Battery Voltage: 2.83 V
Brady Statistic AP VP Percent: 25.31 %
Brady Statistic AP VS Percent: 0.09 %
Brady Statistic AS VP Percent: 73.43 %
Brady Statistic AS VS Percent: 1.17 %
Brady Statistic RA Percent Paced: 26.01 %
Brady Statistic RV Percent Paced: 98.73 %
Date Time Interrogation Session: 20250205001447
Implantable Lead Connection Status: 753985
Implantable Lead Connection Status: 753985
Implantable Lead Connection Status: 753985
Implantable Lead Implant Date: 20170802
Implantable Lead Implant Date: 20170802
Implantable Lead Implant Date: 20170802
Implantable Lead Location: 753858
Implantable Lead Location: 753859
Implantable Lead Location: 753860
Implantable Lead Model: 5076
Implantable Lead Model: 5076
Implantable Pulse Generator Implant Date: 20170802
Lead Channel Impedance Value: 1026 Ohm
Lead Channel Impedance Value: 1064 Ohm
Lead Channel Impedance Value: 342 Ohm
Lead Channel Impedance Value: 418 Ohm
Lead Channel Impedance Value: 418 Ohm
Lead Channel Impedance Value: 456 Ohm
Lead Channel Impedance Value: 475 Ohm
Lead Channel Impedance Value: 551 Ohm
Lead Channel Impedance Value: 551 Ohm
Lead Channel Impedance Value: 646 Ohm
Lead Channel Impedance Value: 798 Ohm
Lead Channel Impedance Value: 836 Ohm
Lead Channel Impedance Value: 950 Ohm
Lead Channel Impedance Value: 950 Ohm
Lead Channel Pacing Threshold Amplitude: 0.5 V
Lead Channel Pacing Threshold Amplitude: 1.375 V
Lead Channel Pacing Threshold Amplitude: 1.375 V
Lead Channel Pacing Threshold Pulse Width: 0.4 ms
Lead Channel Pacing Threshold Pulse Width: 0.4 ms
Lead Channel Pacing Threshold Pulse Width: 0.8 ms
Lead Channel Sensing Intrinsic Amplitude: 1.875 mV
Lead Channel Sensing Intrinsic Amplitude: 1.875 mV
Lead Channel Sensing Intrinsic Amplitude: 19.125 mV
Lead Channel Sensing Intrinsic Amplitude: 19.125 mV
Lead Channel Setting Pacing Amplitude: 1.5 V
Lead Channel Setting Pacing Amplitude: 2 V
Lead Channel Setting Pacing Amplitude: 2.75 V
Lead Channel Setting Pacing Pulse Width: 0.4 ms
Lead Channel Setting Pacing Pulse Width: 0.8 ms
Lead Channel Setting Sensing Sensitivity: 2.8 mV
Zone Setting Status: 755011
Zone Setting Status: 755011

## 2023-04-24 ENCOUNTER — Encounter: Payer: Self-pay | Admitting: Family Medicine

## 2023-05-06 DIAGNOSIS — G4733 Obstructive sleep apnea (adult) (pediatric): Secondary | ICD-10-CM | POA: Diagnosis not present

## 2023-05-12 ENCOUNTER — Other Ambulatory Visit: Payer: Self-pay | Admitting: Internal Medicine

## 2023-05-12 ENCOUNTER — Telehealth: Payer: Self-pay | Admitting: Internal Medicine

## 2023-05-12 MED ORDER — DOFETILIDE 500 MCG PO CAPS: 500.0000 ug | ORAL_CAPSULE | Freq: Two times a day (BID) | ORAL | 2 refills | Status: DC

## 2023-05-12 NOTE — Telephone Encounter (Signed)
*  STAT* If patient is at the pharmacy, call can be transferred to refill team.   1. Which medications need to be refilled? (please list name of each medication and dose if known) dofetilide (TIKOSYN) 500 MCG capsule   2. Which pharmacy/location (including street and city if local pharmacy) is medication to be sent to? [facility] [id number] [place], [place] - [address] AT [facility] [address]   3. Do they need a 30 day or 90 day supply? 90   Patient is out of medication.

## 2023-05-12 NOTE — Telephone Encounter (Signed)
 Pt's medication was sent to pt's pharmacy as requested. Confirmation received.

## 2023-05-19 ENCOUNTER — Encounter: Payer: Self-pay | Admitting: Internal Medicine

## 2023-05-29 NOTE — Progress Notes (Signed)
 Remote pacemaker transmission.

## 2023-06-10 ENCOUNTER — Other Ambulatory Visit: Payer: Self-pay | Admitting: Internal Medicine

## 2023-06-15 NOTE — Progress Notes (Unsigned)
 Cardiology Office Note Date:  06/15/2023  Patient ID:  George [name], George [name] 16-Apr-1950, MRN [id number] PCP:  Melida Quitter, PA  Cardiologist:  Dr. Mayford Knife Electrophysiologist: Dr. Graciela Husbands    Chief Complaint:  *** 6 mo  History of Present Illness: George [name] is a 73 y.o. male with history of  HTN, RBBB 2nd degree AVblock,II, s/p CRT-P,  ATach, NSVT, AFib NICM   Seeing EP team including myself over the years  Dr. Graciela Husbands 04/08/22, had traveled to [place], had a lightheaded spell, unclear cause, not recurrent  Saw Brandi 11/15/22, sruggling w/peripheral neuropathy, noted an episode of slow NSVTs and VT rate reduced RV oPW increased to keep 2:1 Stable QTc, 3.2% AF burden  Saw Mardelle Matte 12/20/22, feeling very well, no cardiac complaints/concerns, no VT/NSVTs, no changes made  TODAY *** symptoms *** eliquis, dose, labs,m bleeding *** tikosyn EKG, labs, teaching *** burden *** %BP *** volume  Device information:  MDT CRT-P, implanted 10/18/15, Dr. Graciela Husbands  AFib Hx Found via device, May 2022  AAD Tikosyn, started 11/2020   Past Medical History:  Diagnosis Date   AICD (automatic cardioverter/defibrillator) present    Arthritis THUMBS   LBBB (left bundle branch block)    bilat. BBB- PPM   OSA (obstructive sleep apnea) 05/17/2021   Peyronie's disease    Presence of permanent cardiac pacemaker     Past Surgical History:  Procedure Laterality Date   CARDIOVASCULAR STRESS TEST  10-18-2002  DR  CRENSHAW   NORMAL ADENOSINE CARDIOLITE/ EF 60%   CARDIOVERSION N/A 08/18/2020   Procedure: CARDIOVERSION;  Surgeon: Sande Rives, MD;  Location: [facility] ENDOSCOPY;  Service: Cardiovascular;  Laterality: N/A;   CARDIOVERSION N/A 11/15/2020   Procedure: CARDIOVERSION;  Surgeon: Quintella Reichert, MD;  Location: [facility] ENDOSCOPY;  Service: Cardiovascular;  Laterality: N/A;   CATARACT EXTRACTION W/ INTRAOCULAR LENS IMPLANT  2006   LEFT EYE   EP IMPLANTABLE DEVICE N/A 10/18/2015   Procedure: BiV  Pacemaker Insertion CRT-P;  Surgeon: Duke Salvia, MD;  [facility] INVASIVE CV LAB;  Service: Cardiovascular;  Laterality: N/A;   INGUINAL HERNIA REPAIR Right 02/23/2019   Procedure: RIGHT INGUINAL HERNIA REPAIR WITH MESH;  Surgeon: Harriette Bouillon, MD;  Location: [facility];  Service: General;  Laterality: Right;   LAPAROSCOPIC CHOLECYSTECTOMY  10-09-2007   LEFT HEART CATH AND CORONARY ANGIOGRAPHY N/A 01/30/2018   Procedure: LEFT HEART CATH AND CORONARY ANGIOGRAPHY;  Surgeon: Lyn Records, MD;  Location: [facility] INVASIVE CV LAB;  Service: Cardiovascular;  Laterality: N/A;   NESBIT PROCEDURE  01/17/2012   Procedure: NESBIT PROCEDURE;  Surgeon: Garnett Farm, MD;  Location: [facility];  Service: Urology;  Laterality: N/A;  16 dot plication    REPAIR DETACHED RETINA, LEFT EYE  1993   REPAIR LEFT INGUINAL HERNIA  2005    Current Outpatient Medications  Medication Sig Dispense Refill   carvedilol (COREG) 25 MG tablet TAKE 1 TABLET (25 MG TOTAL) BY MOUTH TWICE A DAY WITH MEALS 180 tablet 2   apixaban (ELIQUIS) 5 MG TABS tablet Take 1 tablet (5 mg total) by mouth 2 (two) times daily. 180 tablet 1   atorvastatin (LIPITOR) 20 MG tablet Take 1 tablet (20 mg total) by mouth 2 (two) times a week. Take 1 tablet by mouth 2 times per week. 24 tablet 3   Cholecalciferol (D3) 25 MCG (1000 UT) capsule Take 1,000 Units by mouth daily.     clotrimazole-betamethasone (LOTRISONE) cream Apply 1 Application  topically as needed (for skin). 30 g 11   cyanocobalamin (VITAMIN B12) 1000 MCG tablet Take 1,000 mcg by mouth daily.     dofetilide (TIKOSYN) 500 MCG capsule Take 1 capsule (500 mcg total) by mouth 2 (two) times daily. 180 capsule 2   DULoxetine (CYMBALTA) 60 MG capsule Take 1 capsule (60 mg total) by mouth at bedtime. 90 capsule 3   fexofenadine (ALLEGRA) 180 MG tablet TAKE 1 TABLET (180 MG TOTAL) BY MOUTH DAILY. DURING ALLERGY SEASON 90 tablet 1   fluticasone (FLONASE) 50 MCG/ACT  nasal spray USE 1 SPRAY IN EACH NOSTRIL AFTER SINUS RINSES TWICE DAILY. (Patient taking differently: prn) 48 mL 1   furosemide (LASIX) 20 MG tablet TAKE 1 TABLET BY MOUTH EVERY DAY 90 tablet 2   hydrocortisone (ANUSOL-HC) 2.5 % rectal cream Place 1 Application rectally 2 (two) times daily. 30 g 1   losartan (COZAAR) 25 MG tablet TAKE 1/2 TABLET BY MOUTH DAILY 45 tablet 2   Magnesium 400 MG CAPS Take 1 capsule by mouth every evening.     Multiple Vitamins-Minerals (CENTRUM ADULTS) TABS Take 1 tablet by mouth daily.     spironolactone (ALDACTONE) 25 MG tablet TAKE 1 TABLET (25 MG TOTAL) BY MOUTH DAILY. 90 tablet 2   tamsulosin (FLOMAX) 0.4 MG CAPS capsule Take 0.4 mg by mouth daily.     No current facility-administered medications for this visit.    Allergies:   Patient has no known allergies.   Social History:  The patient  reports that he has never smoked. He has never been exposed to tobacco smoke. He has never used smokeless tobacco. He reports that he does not drink alcohol and does not use drugs.   Family History:  The patient's family history includes Arrhythmia in his father; Diabetes in his mother; Healthy in his daughter and son; Heart attack in his paternal grandfather; Heart disease in his father and paternal grandfather; Hyperlipidemia in his father and mother; Hypertension in his father and mother.  ROS:  Please see the history of present illness.  All other systems are reviewed and otherwise negative.   PHYSICAL EXAM:  VS:  There were no vitals taken for this visit. BMI: There is no height or weight on file to calculate BMI. Well nourished, well developed, in no acute distress  HEENT: normocephalic, atraumatic  Neck: no JVD, carotid bruits or masses Cardiac: *** RRR; no significant murmurs, no rubs, or gallops Lungs: ***  CTA b/l, no wheezing, rhonchi or rales  Abd: soft, nontender MS: no deformity or arophy Ext: ***  no edema  Skin: warm and dry, no rash Neuro:  No gross  deficits appreciated Psych: euthymic mood, full affect  *** PPM site is stable, no tethering or discomfort   EKG:  not done today  Device interrogation done today and reviewed by myself:   *** Battery and lead measurements are good *** % effective BiVE pacing   01/26/21: LE arterial study Summary:  Right: Resting right ankle-brachial index is within normal range. No  evidence of significant right lower extremity arterial disease. The right  toe-brachial index is normal.   Left: Resting left ankle-brachial index is within normal range. No  evidence of significant left lower extremity arterial disease. The left  toe-brachial index is normal.   Echo 06/05/20  1. Inferior wall hypokinesis . Left ventricular ejection fraction, by  estimation, is 50 to 55%. The left ventricle has low normal function. The  left ventricle has no regional wall  motion abnormalities. The left  ventricular internal cavity size was  mildly dilated. There is mild asymmetric left ventricular hypertrophy of  the basal and septal segments. Left ventricular diastolic parameters are  indeterminate.   2. Pacing wire in RA/RV . Right ventricular systolic function is normal.  The right ventricular size is normal.   3. Left atrial size was mildly dilated.   4. Right atrial size was mildly dilated.   5. The mitral valve is abnormal. Mild mitral valve regurgitation. No  evidence of mitral stenosis.   6. The aortic valve is abnormal. Aortic valve regurgitation is mild. No  aortic stenosis is present.   7. The inferior vena cava is normal in size with greater than 50%  respiratory variability, suggesting right atrial pressure of 3 mmHg.   01/30/2018: LHC Nonobstructive coronary disease involving the RCA, proximal ramus intermedius, and LAD. Saccular aneurysm involving the proximal to mid segment of the ramus intermedius.  Aneurysm is approximately 5 mm in diameter. Mildly depressed LV function with global  hypokinesis, dyssynergy, and EF estimated at 40%.  Normal LVEDP.   RECOMMENDATIONS:   Aggressive risk factor modification for nonobstructive CAD: LDL less than 70, blood pressure less than 130/80 mmHg, moderate aerobic activity, and monitoring for evidence of hypoglycemia. Saccular aneurysm and ramus intermedius requires management similar to that for coronary artery disease and in addition would recommend antiplatelet therapy to prevent distal embolization (favor monotherapy with Plavix over aspirin).    01/31/17: TTE Study Conclusions - Left ventricle: The cavity size was normal. Systolic function was   normal. The estimated ejection fraction was in the range of 50%   to 55%. Wall motion was normal; there were no regional wall   motion abnormalities. Doppler parameters are consistent with   abnormal left ventricular relaxation (grade 1 diastolic   dysfunction). - Aortic valve: There was mild regurgitation. - Aorta: Aortic root dimension: 39 mm (ED). - Ascending aorta: The ascending aorta was mildly dilated. - Mitral valve: There was mild regurgitation. - Right ventricle: The cavity size was moderately dilated. Wall   thickness was normal. Pacer wire or catheter noted in right   ventricle. - Right atrium: The atrium was mildly dilated. Impressions:  - EF mildly improved from prior study.  10/16/15: TTE Study Conclusions - Left ventricle: The cavity size was normal. Wall thickness was   normal. Systolic function was mildly reduced. The estimated   ejection fraction was in the range of 45% to 50%. Mild diffuse   hypokinesis with no identifiable regional variations. - Ventricular septum: Septal motion showed paradox. - Aortic valve: There was mild regurgitation. - Mitral valve: Systolic bowing without prolapse. There was mild   regurgitation. - Tricuspid valve: Mild prolapse. - Pulmonary arteries: PA peak pressure: 31 mm Hg (S). Impressions: - Second degree AV block and  Incessant PVCs are present throughout   the study and limit evaluation of systolic and diastolic   function.    Recent Labs: 07/18/2022: ALT 20 11/15/2022: BUN 12; Creatinine, Ser 0.96; Hemoglobin 13.6; Magnesium 2.0; Platelets 185; Potassium 4.5; Sodium 138  07/18/2022: Chol/HDL Ratio 3.2; Cholesterol, Total 157; HDL 49; LDL Chol Calc (NIH) 91; Triglycerides 90   CrCl cannot be calculated (Patient's most recent lab result is older than the maximum 21 days allowed.).   Wt Readings from Last 3 Encounters:  12/20/22 168 lb 6.4 oz (76.4 kg)  12/02/22 168 lb (76.2 kg)  11/15/22 167 lb 3.2 oz (75.8 kg)     Other studies  reviewed: Additional studies/records reviewed today include: summarized above  ASSESSMENT AND PLAN:  1. Heart block s/p PPM     *** intact function     *** no programming changes made  2. Mild NICM     ***% effective BP     Recovered LVEF by his last echo 2022     OptiVol ***     *** Again, no symptoms or exam findings to suggest volume OL     *** On BB, ARB, aldactone       3. Known hx of PVC's, NSVT ***      4. Paroxysmal Afib CHA2DS2Vasc is 3, on Eliquis, *** appropriately dosed Tikosyn with *** stable QTc *** *** Re-enforced tikosyn teaching                    Disposition: we will see him in 42mo, sooner if needed.    Current medicines are reviewed at length with the patient today.  The patient did not have any concerns regarding medicines.  Judith Blonder, PA-C 06/15/2023 1:21 PM     [facility] [address] [place] [address] [phone] (office)  [phone] (fax)

## 2023-06-18 ENCOUNTER — Ambulatory Visit: Payer: Medicare HMO | Attending: Physician Assistant | Admitting: Physician Assistant

## 2023-06-18 ENCOUNTER — Encounter: Payer: Self-pay | Admitting: Physician Assistant

## 2023-06-18 VITALS — BP 114/62 | HR 63 | Ht 68.0 in | Wt 166.6 lb

## 2023-06-18 DIAGNOSIS — I480 Paroxysmal atrial fibrillation: Secondary | ICD-10-CM

## 2023-06-18 DIAGNOSIS — I428 Other cardiomyopathies: Secondary | ICD-10-CM

## 2023-06-18 DIAGNOSIS — I4729 Other ventricular tachycardia: Secondary | ICD-10-CM

## 2023-06-18 DIAGNOSIS — Z950 Presence of cardiac pacemaker: Secondary | ICD-10-CM | POA: Diagnosis not present

## 2023-06-18 DIAGNOSIS — Z79899 Other long term (current) drug therapy: Secondary | ICD-10-CM

## 2023-06-18 DIAGNOSIS — I10 Essential (primary) hypertension: Secondary | ICD-10-CM

## 2023-06-18 DIAGNOSIS — Z5181 Encounter for therapeutic drug level monitoring: Secondary | ICD-10-CM | POA: Diagnosis not present

## 2023-06-18 NOTE — Patient Instructions (Signed)
 Medication Instructions:   FOR 3 DAYS ONLY: TAKE FUROSEMIDE 40 MG DAILY   THEN RESUME TAKING FUROSEMIDE 20 MG  ONCE A DAY    *If you need a refill on your cardiac medications before your next appointment, please call your pharmacy*  Lab Work:   PLEASE GO DOWN STAIRS  [facility]  FIRST FLOOR  [address] ( GET OFF [address] MAKE A LEFT AND ANOTHER LEFT LAB ON RIGHT DOWN HALLWAY :   BMET MAG AND CBC TODAY    If you have labs (blood work) drawn today and your tests are completely normal, you will receive your results only by: MyChart Message (if you have MyChart) OR A paper copy in the mail If you have any lab test that is abnormal or we need to change your treatment, we will call you to review the results.  Testing/Procedures:  NONE ORDERED  TODAY     Follow-Up: At [facility], you and your health needs are our priority.  As part of our continuing mission to provide you with exceptional heart care, our providers are all part of one team.  This team includes your primary Cardiologist (physician) and Advanced Practice Providers or APPs (Physician Assistants and Nurse Practitioners) who all work together to provide you with the care you need, when you need it.  Your next appointment:    6 month(s)  Provider:    Sherryl Manges, MD or Francis Dowse, PA-C   We recommend signing up for the patient portal called "MyChart".  Sign up information is provided on this After Visit Summary.  MyChart is used to connect with patients for Virtual Visits (Telemedicine).  Patients are able to view lab/test results, encounter notes, upcoming appointments, etc.  Non-urgent messages can be sent to your provider as well.   To learn more about what you can do with MyChart, go to [email or web address].   Other Instructions       1st Floor: - Lobby - Registration  - Pharmacy  - Lab - Cafe  2nd Floor: - PV Lab - Diagnostic Testing (echo, CT, nuclear med)  3rd Floor: - Vacant  4th  Floor: - TCTS (cardiothoracic surgery) - AFib Clinic - Structural Heart Clinic - Vascular Surgery  - Vascular Ultrasound  5th Floor: - [facility] (general and EP) - Clinical Pharmacy for coumadin, hypertension, lipid, weight-loss medications, and med management appointments    Valet parking services will be available as well.

## 2023-06-19 LAB — CBC
Hematocrit: 37.7 % (ref 37.5–51.0)
Hemoglobin: 12.7 g/dL — ABNORMAL LOW (ref 13.0–17.7)
MCH: 31.2 pg (ref 26.6–33.0)
MCHC: 33.7 g/dL (ref 31.5–35.7)
MCV: 93 fL (ref 79–97)
Platelets: 188 10*3/uL (ref 150–450)
RBC: 4.07 x10E6/uL — ABNORMAL LOW (ref 4.14–5.80)
RDW: 12.3 % (ref 11.6–15.4)
WBC: 6.5 10*3/uL (ref 3.4–10.8)

## 2023-06-19 LAB — BASIC METABOLIC PANEL WITH GFR
BUN/Creatinine Ratio: 15 (ref 10–24)
BUN: 14 mg/dL (ref 8–27)
CO2: 27 mmol/L (ref 20–29)
Calcium: 9.8 mg/dL (ref 8.6–10.2)
Chloride: 100 mmol/L (ref 96–106)
Creatinine, Ser: 0.93 mg/dL (ref 0.76–1.27)
Glucose: 105 mg/dL — ABNORMAL HIGH (ref 70–99)
Potassium: 4.6 mmol/L (ref 3.5–5.2)
Sodium: 139 mmol/L (ref 134–144)
eGFR: 88 mL/min/{1.73_m2} (ref >59)

## 2023-06-19 LAB — MAGNESIUM: Magnesium: 2.1 mg/dL (ref 1.6–2.3)

## 2023-07-23 ENCOUNTER — Ambulatory Visit (INDEPENDENT_AMBULATORY_CARE_PROVIDER_SITE_OTHER): Payer: Medicare HMO

## 2023-07-23 DIAGNOSIS — I428 Other cardiomyopathies: Secondary | ICD-10-CM | POA: Diagnosis not present

## 2023-07-25 LAB — CUP PACEART REMOTE DEVICE CHECK
Battery Remaining Longevity: 12 mo
Battery Voltage: 2.78 V
Brady Statistic AP VP Percent: 27.28 %
Brady Statistic AP VS Percent: 0.08 %
Brady Statistic AS VP Percent: 71.85 %
Brady Statistic AS VS Percent: 0.79 %
Brady Statistic RA Percent Paced: 27.75 %
Brady Statistic RV Percent Paced: 99.13 %
Date Time Interrogation Session: 20250506232514
Implantable Lead Connection Status: 753985
Implantable Lead Connection Status: 753985
Implantable Lead Connection Status: 753985
Implantable Lead Implant Date: 20170802
Implantable Lead Implant Date: 20170802
Implantable Lead Implant Date: 20170802
Implantable Lead Location: 753858
Implantable Lead Location: 753859
Implantable Lead Location: 753860
Implantable Lead Model: 5076
Implantable Lead Model: 5076
Implantable Pulse Generator Implant Date: 20170802
Lead Channel Impedance Value: 1007 Ohm
Lead Channel Impedance Value: 1045 Ohm
Lead Channel Impedance Value: 342 Ohm
Lead Channel Impedance Value: 399 Ohm
Lead Channel Impedance Value: 418 Ohm
Lead Channel Impedance Value: 437 Ohm
Lead Channel Impedance Value: 456 Ohm
Lead Channel Impedance Value: 494 Ohm
Lead Channel Impedance Value: 513 Ohm
Lead Channel Impedance Value: 646 Ohm
Lead Channel Impedance Value: 760 Ohm
Lead Channel Impedance Value: 798 Ohm
Lead Channel Impedance Value: 912 Ohm
Lead Channel Impedance Value: 950 Ohm
Lead Channel Pacing Threshold Amplitude: 0.625 V
Lead Channel Pacing Threshold Amplitude: 1.375 V
Lead Channel Pacing Threshold Amplitude: 1.375 V
Lead Channel Pacing Threshold Pulse Width: 0.4 ms
Lead Channel Pacing Threshold Pulse Width: 0.4 ms
Lead Channel Pacing Threshold Pulse Width: 0.8 ms
Lead Channel Sensing Intrinsic Amplitude: 1.75 mV
Lead Channel Sensing Intrinsic Amplitude: 1.75 mV
Lead Channel Sensing Intrinsic Amplitude: 19.125 mV
Lead Channel Sensing Intrinsic Amplitude: 19.125 mV
Lead Channel Setting Pacing Amplitude: 1.5 V
Lead Channel Setting Pacing Amplitude: 2.5 V
Lead Channel Setting Pacing Amplitude: 2.75 V
Lead Channel Setting Pacing Pulse Width: 0.4 ms
Lead Channel Setting Pacing Pulse Width: 0.8 ms
Lead Channel Setting Sensing Sensitivity: 2.8 mV
Zone Setting Status: 755011
Zone Setting Status: 755011

## 2023-07-28 ENCOUNTER — Other Ambulatory Visit: Payer: Medicare HMO

## 2023-08-04 ENCOUNTER — Encounter: Payer: Medicare HMO | Admitting: Family Medicine

## 2023-08-07 ENCOUNTER — Other Ambulatory Visit: Payer: Self-pay | Admitting: Internal Medicine

## 2023-08-07 DIAGNOSIS — I4819 Other persistent atrial fibrillation: Secondary | ICD-10-CM

## 2023-08-07 NOTE — Telephone Encounter (Signed)
 Prescription refill request for Eliquis  received. Indication: afib  Last office visit:ursuy, 06/18/2023 Scr: 0.93, 06/18/2023 Age: 73 yo  Weight: 75.6 kg   Refill sent.

## 2023-08-13 ENCOUNTER — Ambulatory Visit: Payer: Self-pay | Admitting: Cardiology

## 2023-09-02 NOTE — Addendum Note (Signed)
 Addended by: Lott Rouleau A on: 09/02/2023 09:22 AM   Modules accepted: Orders

## 2023-09-02 NOTE — Progress Notes (Signed)
 Remote pacemaker transmission.

## 2023-09-08 DIAGNOSIS — G4733 Obstructive sleep apnea (adult) (pediatric): Secondary | ICD-10-CM | POA: Diagnosis not present

## 2023-09-10 ENCOUNTER — Other Ambulatory Visit: Payer: Self-pay | Admitting: Neurology

## 2023-09-10 DIAGNOSIS — G629 Polyneuropathy, unspecified: Secondary | ICD-10-CM

## 2023-09-10 DIAGNOSIS — F419 Anxiety disorder, unspecified: Secondary | ICD-10-CM

## 2023-09-11 DIAGNOSIS — N5201 Erectile dysfunction due to arterial insufficiency: Secondary | ICD-10-CM | POA: Diagnosis not present

## 2023-09-11 DIAGNOSIS — N433 Hydrocele, unspecified: Secondary | ICD-10-CM | POA: Diagnosis not present

## 2023-09-11 NOTE — Telephone Encounter (Signed)
 Called patient and made him aware of need for a year follow up to continue RX per office policy.

## 2023-09-11 NOTE — Progress Notes (Signed)
 I saw KOHLER PELLERITO in neurology clinic on 09/17/23 in follow up for neuropathy.  HPI: BRONSYN SHAPPELL is a 73 y.o. year old male with a history of pre-diabetes, HTN, pAfib (on Tikosyn  and eliquis ), 2nd degree AV block s/p CRT-P, NICM, OSA on CPAP, anxiety  who we last saw on 10/02/22.  To briefly review: 04/03/22: Patient has had difficulties for about 2 years. He was having trouble getting off the floor when kneeling down. He spoke to cardiology who felt it may be a blood flow issue and recommended compression stockings. He had blood flow tests done that he states were normal. About 1.5 years ago, he noticed tingling, burning in his right leg. He feels like it is starting on his left leg as well, but not as bad. He feels like he is losing strength throughout his body, including his arms. He denies ever really having pain in his arms. He does endorse significant cramps in both hands.   When he is standing, he feels like his is leaning forward. When he is walking, he is off balance and will bump into things. He takes a lot of medications and has wondered if it is medication side effects. He does not feel dizzy or lightheaded. He has fallen about 2-3 times in the past year. It is usually getting his feet tangled up or once the dog tripped him up. Patient mentions that his wife tells him that he does not pick up his feet enough. He denies freezing while walking or trying to move.   Patient was started on Cymbalta  20 mg daily in November for attitude problems. This has helped a bit with his pain. He has not tried any other medications and has not done any physical therapy.   Recent EMG performed by me on 02/04/22 showed a mild axonal large fiber polyneuropathy, severe right carpal tunnel, and the residuals of an old right C8 radiculopathy. Patient was referred to neurology for further work up and management.   The patient does not impaired sweating, heat or cold intolerance, gastroparetic early  satiety, postprandial abdominal bloating, constipation, or bowel or bladder dyscontrol. He does endorse dry eyes and mouth. He also endorses some lightheadedness when standing requiring him to stand for a few seconds before this resolves.   He does not report any constitutional symptoms like fever, night sweats, anorexia or unintentional weight loss.   EtOH use: No  Restrictive diet? No Family history of neuropathy/myopathy/NM disease? Brother had neuropathy (does not know why)   Of note, patient was previously seen in this office by Dr. Georjean for cognitive complaints. Neuropsych testing in 2022 showed mild cognitive impairment (fairly benign findings per assessment).  10/02/22: B1 and IFE were unremarkable.   Patient has had some improvement in burning in his right leg. He still has pain in both legs. He thinks the Cymbalta  is working, but maybe not enough. Patient also tried lidocaine  cream, that might have helped some. He also started massage therapy in his hips and legs.   Wife mentions he walks slow and doesn't pick up his feet. He has had a couple of falls since last visit. He tripped over a mat in the kitchen. The other time was at night when he tripped over something.    Patient did physical therapy. He also feels this helped some. He has recently joined a gym as well.    Patient continues to have hand symptoms from carpal tunnel that is about the same as prior.  He has not tried wrist splints yet.   Patient has no new complaints today.  Most recent Assessment and Plan (10/02/22): This is MATTIA LIFORD, a 73 y.o. male with distal symmetric polyneuropathy c/b imbalance. His only known risk factor is pre-diabetes (HbA1c of 5.8). B12 was borderline low in 07/2022 at 330 as well, which can contribute. B1, and IFE were normal.   Plan: -Increase Cymbalta  to 60 mg at bedtime -Again recommend wrist splints -Continue home PT exercises -Fall precautions discussed -B12 1000 mcg daily due to  borderline low B12 level (I noticed this after appointment, but caught wife and told her and also sent a MyChart message to patient)  Since their last visit: Patient's symptoms are similar to prior. His legs are better but he occasionally gets pains in his feet. He feels Cymbalta  is helping. He mentions low back pain that occasionally goes into his right leg. He goes to message therapy and thinks this helps. He had one fall but this was related to tripping on a step. There were no injuries.  He has no significant CTS symptoms.  Current neuropathic medications: -Cymbalta  60 mg at bedtime -B12 1000 mcg daily  No side effects.  Patient has no new complaints.   MEDICATIONS:  Outpatient Encounter Medications as of 09/17/2023  Medication Sig   atorvastatin  (LIPITOR) 20 MG tablet Take 1 tablet (20 mg total) by mouth 2 (two) times a week. Take 1 tablet by mouth 2 times per week.   carvedilol  (COREG ) 25 MG tablet TAKE 1 TABLET (25 MG TOTAL) BY MOUTH TWICE A DAY WITH MEALS   Cholecalciferol (D3) 25 MCG (1000 UT) capsule Take 1,000 Units by mouth daily.   clotrimazole -betamethasone  (LOTRISONE ) cream Apply 1 Application topically as needed (for skin).   cyanocobalamin  (VITAMIN B12) 1000 MCG tablet Take 1,000 mcg by mouth daily.   dofetilide  (TIKOSYN ) 500 MCG capsule Take 1 capsule (500 mcg total) by mouth 2 (two) times daily.   DULoxetine  (CYMBALTA ) 60 MG capsule TAKE 1 CAPSULE (60 MG TOTAL) BY MOUTH AT BEDTIME.   ELIQUIS  5 MG TABS tablet TAKE 1 TABLET BY MOUTH TWICE A DAY   fexofenadine  (ALLEGRA ) 180 MG tablet TAKE 1 TABLET (180 MG TOTAL) BY MOUTH DAILY. DURING ALLERGY SEASON   fluticasone  (FLONASE ) 50 MCG/ACT nasal spray USE 1 SPRAY IN EACH NOSTRIL AFTER SINUS RINSES TWICE DAILY.   furosemide  (LASIX ) 20 MG tablet TAKE 1 TABLET BY MOUTH EVERY DAY   hydrocortisone  (ANUSOL -HC) 2.5 % rectal cream Place 1 Application rectally 2 (two) times daily. (Patient taking differently: Place 1 Application rectally  as needed.)   losartan  (COZAAR ) 25 MG tablet TAKE 1/2 TABLET BY MOUTH DAILY   Magnesium  400 MG CAPS Take 1 capsule by mouth every evening.   Multiple Vitamins-Minerals (CENTRUM ADULTS) TABS Take 1 tablet by mouth daily.   spironolactone  (ALDACTONE ) 25 MG tablet TAKE 1 TABLET (25 MG TOTAL) BY MOUTH DAILY.   tamsulosin  (FLOMAX ) 0.4 MG CAPS capsule Take 0.4 mg by mouth daily.   No facility-administered encounter medications on file as of 09/17/2023.    PAST MEDICAL HISTORY: Past Medical History:  Diagnosis Date   AICD (automatic cardioverter/defibrillator) present    Arthritis THUMBS   LBBB (left bundle branch block)    bilat. BBB- PPM   OSA (obstructive sleep apnea) 05/17/2021   Peyronie's disease    Presence of permanent cardiac pacemaker     PAST SURGICAL HISTORY: Past Surgical History:  Procedure Laterality Date   CARDIOVASCULAR STRESS TEST  10-18-2002  DR  CRENSHAW   NORMAL ADENOSINE CARDIOLITE/ EF 60%   CARDIOVERSION N/A 08/18/2020   Procedure: CARDIOVERSION;  Surgeon: Barbaraann Darryle Ned, MD;  Location: [facility] ENDOSCOPY;  Service: Cardiovascular;  Laterality: N/A;   CARDIOVERSION N/A 11/15/2020   Procedure: CARDIOVERSION;  Surgeon: Shlomo Wilbert SAUNDERS, MD;  Location: [facility] ENDOSCOPY;  Service: Cardiovascular;  Laterality: N/A;   CATARACT EXTRACTION W/ INTRAOCULAR LENS IMPLANT  2006   LEFT EYE   EP IMPLANTABLE DEVICE N/A 10/18/2015   Procedure: BiV Pacemaker Insertion CRT-P;  Surgeon: Elspeth JAYSON Sage, MD;  Location: [facility] INVASIVE CV LAB;  Service: Cardiovascular;  Laterality: N/A;   INGUINAL HERNIA REPAIR Right 02/23/2019   Procedure: RIGHT INGUINAL HERNIA REPAIR WITH MESH;  Surgeon: Vanderbilt Ned, MD;  Location: [facility];  Service: General;  Laterality: Right;   LAPAROSCOPIC CHOLECYSTECTOMY  10-09-2007   LEFT HEART CATH AND CORONARY ANGIOGRAPHY N/A 01/30/2018   Procedure: LEFT HEART CATH AND CORONARY ANGIOGRAPHY;  Surgeon: Claudene Victory ORN, MD;  Location: [facility] INVASIVE CV LAB;   Service: Cardiovascular;  Laterality: N/A;   NESBIT PROCEDURE  01/17/2012   Procedure: NESBIT PROCEDURE;  Surgeon: Oneil JAYSON Rafter, MD;  Location: [facility];  Service: Urology;  Laterality: N/A;  16 dot plication    REPAIR DETACHED RETINA, LEFT EYE  1993   REPAIR LEFT INGUINAL HERNIA  2005    ALLERGIES: No Known Allergies  FAMILY HISTORY: Family History  Problem Relation Age of Onset   Diabetes Mother    Hypertension Mother    Hyperlipidemia Mother    Heart disease Father    Hyperlipidemia Father    Hypertension Father    Arrhythmia Father        paf   Healthy Daughter    Healthy Son    Heart disease Paternal Grandfather    Heart attack Paternal Grandfather     SOCIAL HISTORY: Social History   Tobacco Use   Smoking status: Never    Passive exposure: Never   Smokeless tobacco: Never  Vaping Use   Vaping status: Never Used  Substance Use Topics   Alcohol use: No   Drug use: No   Social History   Social History Narrative   Right handed    Lives with wife      What is your current occupation?retired   Do you live at home alone?;lives with wife   One story home   Caffeine 1/2 liter a day     Objective:  Vital Signs:  BP 128/80   Pulse 71   Ht 5' 8 (1.727 m)   Wt 166 lb (75.3 kg)   SpO2 98%   BMI 25.24 kg/m   General: General appearance: Awake and alert. No distress. Cooperative with exam.  Skin: No obvious rash or jaundice. HEENT: Atraumatic. Anicteric. Lungs: Non-labored breathing on room air  Extremities: No edema. No obvious deformity.  Musculoskeletal: No obvious joint swelling.  Neurological: Mental Status: Alert. Speech fluent. No pseudobulbar affect Cranial Nerves: CNII: No RAPD. Visual fields intact. CNIII, IV, VI: PERRL. No nystagmus. EOMI. CN V: Facial sensation intact bilaterally to fine touch. CN VII: Facial muscles symmetric and strong. No ptosis at rest. CN VIII: Hears finger rub well bilaterally. CN IX: No  hypophonia. CN X: Palate elevates symmetrically. CN XI: Full strength shoulder shrug bilaterally. CN XII: Tongue protrusion full and midline. No atrophy or fasciculations. No significant dysarthria Motor: Tone is normal.  Individual muscle group testing (MRC grade out of 5):  Movement     Neck flexion 5    Neck extension 5     Right Left   Shoulder abduction 5 5   Elbow flexion 5 5   Elbow extension 5 5   Finger abduction - FDI 5 5   Finger abduction - ADM 5 5   Finger extension 5 5   Finger distal flexion - 2/3 5 5    Finger distal flexion - 4/5 5 5    Thumb flexion - FPL 5 5   Thumb abduction - APB 4+ 4+    Hip flexion 5 5   Knee extension 5 5   Knee flexion 5 5   Dorsiflexion 5 5   Plantarflexion 5 5    Reflexes:  Right Left  Bicep 2+ 2+  Tricep 2+ 2+  BrRad 2+ 2+  Knee 2+ 2+  Ankle 0 0   Sensation: Pinprick: Intact in all extremities Vibration: Diminished to knees in bilateral lower extremities Coordination: Intact finger-to- nose-finger bilaterally. Gait: Able to rise from chair with arms crossed unassisted. Antalgic, wide-based gait.   Lab and Test Review: New results: 06/18/23: CBC unremarkable ([facility] 93) BMP significant for glucose of 105 Mg wnl  Previously reviewed results: 04/03/21: IFE: no M protein B1 wnl   07/18/22: Vit D wnl B12: 330 Folate wnl   11/09/21: B12: 418 Folate wnl HbA1c: 5.8 TSH: 1.56 CBC and CMP wnl   Imaging: EMG (02/04/22): NCV & EMG Findings: Extensive electrodiagnostic evaluation of the right upper and lower limbs show: Right sural, superficial peroneal, and median sensory responses are absent. Right ulnar and radial sensory responses are within normal limits. Right peroneal (EDB) motor response is absent. Right tibial (AH) motor response shows reduced amplitude (3.2 mV). Right median (APB) motor response shows prolonged distal onset latency (5.1 ms). Right peroneal (TA) and ulnar (ADM) motor responses are within normal  limits. Right H reflex is absent. Chronic motor axon loss changes without accompanying active denervation changes are seen in the right tibialis anterior, medial head of gastrocnemius, abductor pollicis brevis, first dorsal interosseous, and extensor indicis proprius muscles.   Impression: This is an abnormal electrodiagnostic evaluation. The findings are most consistent with the following: Evidence of a generalized large fiber sensorimotor polyneuropathy, axon loss in type, mild in degree electrically. Evidence of a right median mononeuropathy at or distal to the wrist, consistent with carpal tunnel syndrome, severe in degree electrically. The residuals of an old intraspinal canal lesion (ie: motor radiculopathy) at the right C8 root, mild in degree electrically. No electrodiagnostic evidence of a right lumbosacral (L3-S1) motor radiculopathy.   CT head wo contrast (03/03/20): FINDINGS: Brain: Age-appropriate atrophy, with progression since 2008. Negative for hydrocephalus. Negative for acute infarct, hemorrhage, mass. Small chronic lacunar infarction in the left anterior frontal white matter.   Vascular: Negative for hyperdense vessel   Skull: Negative   Sinuses/Orbits: Paranasal sinuses clear. Left cataract extraction. No orbital lesion.   Other: None   IMPRESSION: No acute abnormality. Age-appropriate atrophy. Small chronic infarct left frontal white matter.  ASSESSMENT: This is TAYVIAN HOLYCROSS, a 73 y.o. male with:  Distal symmetric neuropathy - symptoms stable. Mild imbalance. No falls. Pain currently controlled with Cymbalta  60 mg at bedtime. Risk factors include B12 deficiency and pre-DM CTS - right side severe by EMG. Patient wearing braces. No significant symptoms currently. Mild APB weakness bilaterally  Plan: -Continue Cymbalta  60 at bedtime -B12 1000 mcg daily -Alpha lipoic acid 600 mg daily -Continue wrist braces for CTS -  Fall precautions discussed  Return to  clinic in 1 year  Total time spent reviewing records, interview, history/exam, documentation, and coordination of care on day of encounter:  30 min  Venetia Potters, MD

## 2023-09-17 ENCOUNTER — Ambulatory Visit: Admitting: Neurology

## 2023-09-17 ENCOUNTER — Encounter: Payer: Self-pay | Admitting: Neurology

## 2023-09-17 VITALS — BP 128/80 | HR 71 | Ht 68.0 in | Wt 166.0 lb

## 2023-09-17 DIAGNOSIS — G629 Polyneuropathy, unspecified: Secondary | ICD-10-CM | POA: Diagnosis not present

## 2023-09-17 DIAGNOSIS — G5603 Carpal tunnel syndrome, bilateral upper limbs: Secondary | ICD-10-CM

## 2023-09-17 DIAGNOSIS — M5412 Radiculopathy, cervical region: Secondary | ICD-10-CM

## 2023-09-17 DIAGNOSIS — R269 Unspecified abnormalities of gait and mobility: Secondary | ICD-10-CM

## 2023-09-17 NOTE — Patient Instructions (Addendum)
 Continue Cymbalta  60 mg at bedtime.  Continue B12 1000 mcg daily.  Alpha lipoic acid 600mg  daily has some research data suggesting it helps with nerve health. No major side effects other than <1% of people report upset stomach. This can be taken twice per day (1200mg  daily) if no relief obtained. You can buy this over the counter or online.  I will see you again in 1 year or sooner if needed. Please let me know if you have any questions or concerns in the meantime.  The physicians and staff at [facility] are committed to providing excellent care. You may receive a survey requesting feedback about your experience at our office. We strive to receive very good responses to the survey questions. If you feel that your experience would prevent you from giving the office a very good  response, please contact our office to try to remedy the situation. We may be reached at [phone]. Thank you for taking the time out of your busy day to complete the survey.  Venetia Potters, MD [facility]  Preventing Falls at [facility] are common, often dreaded events in the lives of older people. Aside from the obvious injuries and even death that may result, fall can cause wide-ranging consequences including loss of independence, mental decline, decreased activity and mobility. Younger people are also at risk of falling, especially those with chronic illnesses and fatigue.  Ways to reduce risk for falling Examine diet and medications. Warm foods and alcohol dilate blood vessels, which can lead to dizziness when standing. Sleep aids, antidepressants and pain medications can also increase the likelihood of a fall.  Get a vision exam. Poor vision, cataracts and glaucoma increase the chances of falling.  Check foot gear. Shoes should fit snugly and have a sturdy, nonskid sole and a broad, low heel  Participate in a physician-approved exercise program to build and maintain muscle strength and improve  balance and coordination. Programs that use ankle weights or stretch bands are excellent for muscle-strengthening. Water aerobics programs and low-impact Tai Chi programs have also been shown to improve balance and coordination.  Increase vitamin D  intake. Vitamin D  improves muscle strength and increases the amount of calcium  the body is able to absorb and deposit in bones.  How to prevent falls from common hazards Floors - Remove all loose wires, cords, and throw rugs. Minimize clutter. Make sure rugs are anchored and smooth. Keep furniture in its usual place.  Chairs -- Use chairs with straight backs, armrests and firm seats. Add firm cushions to existing pieces to add height.  Bathroom - Install grab bars and non-skid tape in the tub or shower. Use a bathtub transfer bench or a shower chair with a back support Use an elevated toilet seat and/or safety rails to assist standing from a low surface. Do not use towel racks or bathroom tissue holders to help you stand.  Lighting - Make sure halls, stairways, and entrances are well-lit. Install a night light in your bathroom or hallway. Make sure there is a light switch at the top and bottom of the staircase. Turn lights on if you get up in the middle of the night. Make sure lamps or light switches are within reach of the bed if you have to get up during the night.  Kitchen - Install non-skid rubber mats near the sink and stove. Clean spills immediately. Store frequently used utensils, pots, pans between waist and eye level. This helps prevent reaching and bending. Sit when  getting things out of lower cupboards.  Living room/ Bedrooms - Place furniture with wide spaces in between, giving enough room to move around. Establish a route through the living room that gives you something to hold onto as you walk.  Stairs - Make sure treads, rails, and rugs are secure. Install a rail on both sides of the stairs. If stairs are a threat, it might be helpful to  arrange most of your activities on the lower level to reduce the number of times you must climb the stairs.  Entrances and doorways - Install metal handles on the walls adjacent to the doorknobs of all doors to make it more secure as you travel through the doorway.  Tips for maintaining balance Keep at least one hand free at all times. Try using a backpack or fanny pack to hold things rather than carrying them in your hands. Never carry objects in both hands when walking as this interferes with keeping your balance.  Attempt to swing both arms from front to back while walking. This might require a conscious effort if Parkinson's disease has diminished your movement. It will, however, help you to maintain balance and posture, and reduce fatigue.  Consciously lift your feet off of the ground when walking. Shuffling and dragging of the feet is a common culprit in losing your balance.  When trying to navigate turns, use a U technique of facing forward and making a wide turn, rather than pivoting sharply.  Try to stand with your feet shoulder-length apart. When your feet are close together for any length of time, you increase your risk of losing your balance and falling.  Do one thing at a time. Don't try to walk and accomplish another task, such as reading or looking around. The decrease in your automatic reflexes complicates motor function, so the less distraction, the better.  Do not wear rubber or gripping soled shoes, they might catch on the floor and cause tripping.  Move slowly when changing positions. Use deliberate, concentrated movements and, if needed, use a grab bar or walking aid. Count 15 seconds between each movement. For example, when rising from a seated position, wait 15 seconds after standing to begin walking.  If balance is a continuous problem, you might want to consider a walking aid such as a cane, walking stick, or walker. Once you've mastered walking with help, you might  be ready to try it on your own again.

## 2023-09-23 ENCOUNTER — Other Ambulatory Visit: Payer: Self-pay | Admitting: *Deleted

## 2023-09-23 DIAGNOSIS — E785 Hyperlipidemia, unspecified: Secondary | ICD-10-CM

## 2023-09-23 DIAGNOSIS — E559 Vitamin D deficiency, unspecified: Secondary | ICD-10-CM

## 2023-09-23 DIAGNOSIS — I10 Essential (primary) hypertension: Secondary | ICD-10-CM

## 2023-09-23 DIAGNOSIS — Z131 Encounter for screening for diabetes mellitus: Secondary | ICD-10-CM

## 2023-09-24 ENCOUNTER — Other Ambulatory Visit

## 2023-09-24 DIAGNOSIS — E785 Hyperlipidemia, unspecified: Secondary | ICD-10-CM

## 2023-09-24 DIAGNOSIS — Z131 Encounter for screening for diabetes mellitus: Secondary | ICD-10-CM | POA: Diagnosis not present

## 2023-09-24 DIAGNOSIS — E559 Vitamin D deficiency, unspecified: Secondary | ICD-10-CM | POA: Diagnosis not present

## 2023-09-24 DIAGNOSIS — I10 Essential (primary) hypertension: Secondary | ICD-10-CM | POA: Diagnosis not present

## 2023-09-25 ENCOUNTER — Ambulatory Visit: Payer: Self-pay

## 2023-09-25 LAB — COMPREHENSIVE METABOLIC PANEL WITH GFR
ALT: 19 IU/L (ref 0–44)
AST: 26 IU/L (ref 0–40)
Albumin: 4.4 g/dL (ref 3.9–4.9)
Alkaline Phosphatase: 92 IU/L (ref 44–121)
BUN/Creatinine Ratio: 15 (ref 10–24)
BUN: 14 mg/dL (ref 8–27)
Bilirubin Total: 1.4 mg/dL — ABNORMAL HIGH (ref 0.0–1.2)
CO2: 23 mmol/L (ref 20–29)
Calcium: 9.5 mg/dL (ref 8.6–10.2)
Chloride: 100 mmol/L (ref 96–106)
Creatinine, Ser: 0.95 mg/dL (ref 0.76–1.27)
Globulin, Total: 2.9 g/dL (ref 1.5–4.5)
Glucose: 98 mg/dL (ref 70–99)
Potassium: 4.7 mmol/L (ref 3.5–5.2)
Sodium: 138 mmol/L (ref 134–144)
Total Protein: 7.3 g/dL (ref 6.0–8.5)
eGFR: 86 mL/min/1.73 (ref >59)

## 2023-09-25 LAB — LIPID PANEL
Chol/HDL Ratio: 3.4 ratio (ref 0.0–5.0)
Cholesterol, Total: 164 mg/dL (ref 100–199)
HDL: 48 mg/dL
LDL Chol Calc (NIH): 94 mg/dL (ref 0–99)
Triglycerides: 123 mg/dL (ref 0–149)
VLDL Cholesterol Cal: 22 mg/dL (ref 5–40)

## 2023-09-25 LAB — CBC WITH DIFFERENTIAL/PLATELET
Basophils Absolute: 0 x10E3/uL (ref 0.0–0.2)
Basos: 1 %
EOS (ABSOLUTE): 0.2 x10E3/uL (ref 0.0–0.4)
Eos: 2 %
Hematocrit: 42.6 % (ref 37.5–51.0)
Hemoglobin: 13.9 g/dL (ref 13.0–17.7)
Immature Grans (Abs): 0 x10E3/uL (ref 0.0–0.1)
Immature Granulocytes: 0 %
Lymphocytes Absolute: 2.2 x10E3/uL (ref 0.7–3.1)
Lymphs: 34 %
MCH: 31.1 pg (ref 26.6–33.0)
MCHC: 32.6 g/dL (ref 31.5–35.7)
MCV: 95 fL (ref 79–97)
Monocytes Absolute: 0.5 x10E3/uL (ref 0.1–0.9)
Monocytes: 8 %
Neutrophils Absolute: 3.5 x10E3/uL (ref 1.4–7.0)
Neutrophils: 55 %
Platelets: 199 x10E3/uL (ref 150–450)
RBC: 4.47 x10E6/uL (ref 4.14–5.80)
RDW: 12.4 % (ref 11.6–15.4)
WBC: 6.5 x10E3/uL (ref 3.4–10.8)

## 2023-09-25 LAB — HEMOGLOBIN A1C
Est. average glucose Bld gHb Est-mCnc: 117 mg/dL
Hgb A1c MFr Bld: 5.7 % — ABNORMAL HIGH (ref 4.8–5.6)

## 2023-09-25 LAB — VITAMIN D 25 HYDROXY (VIT D DEFICIENCY, FRACTURES): Vit D, 25-Hydroxy: 35.9 ng/mL (ref 30.0–100.0)

## 2023-09-25 LAB — TSH: TSH: 1.19 u[IU]/mL (ref 0.450–4.500)

## 2023-09-30 NOTE — Progress Notes (Unsigned)
 Complete physical exam  Patient: George [name]   DOB: 1950-08-01   73 y.o. Male  MRN: [id number]  Subjective:    No chief complaint on file.   George [name] is a 73 y.o. male who presents today for a complete physical exam. He reports consuming a {diet types:17450} diet. {types:19826} He generally feels {DESC; WELL/FAIRLY WELL/POORLY:18703}. He reports sleeping {DESC; WELL/FAIRLY WELL/POORLY:18703}. He {does/does not:200015} have additional problems to discuss today.    Most recent fall risk assessment:    09/17/2023    1:35 PM  Fall Risk   Falls in the past year? 1  Number falls in past yr: 1  Injury with Fall? 0  Follow up Falls evaluation completed     Most recent depression screenings:    12/02/2022    9:19 AM 10/03/2022   10:19 AM  PHQ 2/9 Scores  PHQ - 2 Score 1 0  PHQ- 9 Score 2     {VISON DENTAL STD PSA (Optional):27386}  {History (Optional):23778}  Patient Care Team: Gayle Saddie JULIANNA DEVONNA as PCP - General (Physician Assistant) Fernande Elspeth BROCKS, MD as PCP - Cardiology (Cardiology) Fernande Elspeth BROCKS, MD as PCP - Electrophysiology (Cardiology) Lennard Lesta JULIANNA, MD as Consulting Physician (Gastroenterology) Shona Rush, MD as Consulting Physician (Dermatology) Pa, [facility] as Consulting Physician (Optometry) Fernande Elspeth BROCKS, MD as Consulting Physician (Cardiology) Georjean Darice HERO, MD as Consulting Physician (Neurology)   Outpatient Medications Prior to Visit  Medication Sig   atorvastatin  (LIPITOR) 20 MG tablet Take 1 tablet (20 mg total) by mouth 2 (two) times a week. Take 1 tablet by mouth 2 times per week.   carvedilol  (COREG ) 25 MG tablet TAKE 1 TABLET (25 MG TOTAL) BY MOUTH TWICE A DAY WITH MEALS   Cholecalciferol (D3) 25 MCG (1000 UT) capsule Take 1,000 Units by mouth daily.   clotrimazole -betamethasone  (LOTRISONE ) cream Apply 1 Application topically as needed (for skin).   cyanocobalamin  (VITAMIN B12) 1000 MCG tablet Take 1,000 mcg by mouth daily.    dofetilide  (TIKOSYN ) 500 MCG capsule Take 1 capsule (500 mcg total) by mouth 2 (two) times daily.   DULoxetine  (CYMBALTA ) 60 MG capsule TAKE 1 CAPSULE (60 MG TOTAL) BY MOUTH AT BEDTIME.   ELIQUIS  5 MG TABS tablet TAKE 1 TABLET BY MOUTH TWICE A DAY   fexofenadine  (ALLEGRA ) 180 MG tablet TAKE 1 TABLET (180 MG TOTAL) BY MOUTH DAILY. DURING ALLERGY SEASON   fluticasone  (FLONASE ) 50 MCG/ACT nasal spray USE 1 SPRAY IN EACH NOSTRIL AFTER SINUS RINSES TWICE DAILY.   furosemide  (LASIX ) 20 MG tablet TAKE 1 TABLET BY MOUTH EVERY DAY   hydrocortisone  (ANUSOL -HC) 2.5 % rectal cream Place 1 Application rectally 2 (two) times daily. (Patient taking differently: Place 1 Application rectally as needed.)   losartan  (COZAAR ) 25 MG tablet TAKE 1/2 TABLET BY MOUTH DAILY   Magnesium  400 MG CAPS Take 1 capsule by mouth every evening.   Multiple Vitamins-Minerals (CENTRUM ADULTS) TABS Take 1 tablet by mouth daily.   spironolactone  (ALDACTONE ) 25 MG tablet TAKE 1 TABLET (25 MG TOTAL) BY MOUTH DAILY.   tamsulosin  (FLOMAX ) 0.4 MG CAPS capsule Take 0.4 mg by mouth daily.   No facility-administered medications prior to visit.    ROS        Objective:     There were no vitals taken for this visit. {Vitals History (Optional):23777}  Physical Exam   No results found for any visits on 10/01/23. {Show previous labs (optional):23779}    Assessment &  Plan:    Routine Health Maintenance and Physical Exam  Immunization History  Administered Date(s) Administered   PFIZER(Purple Top)SARS-COV-2 Vaccination 05/11/2019, 06/01/2019, 12/28/2019   Td 03/18/2006   Tdap 11/13/2016    Health Maintenance  Topic Date Due   Pneumococcal Vaccine: 50+ Years (1 of 2 - PCV) Never done   Zoster Vaccines- Shingrix (1 of 2) Never done   COVID-19 Vaccine (4 - 2024-25 season) 11/17/2022   Medicare Annual Wellness (AWV)  10/03/2023   Hepatitis C Screening  11/11/2028 (Originally 02/09/1969)   INFLUENZA VACCINE  10/17/2023    DTaP/Tdap/Td (3 - Td or Tdap) 11/14/2026   Colonoscopy  09/10/2032   Hepatitis B Vaccines  Aged Out   HPV VACCINES  Aged Out   Meningococcal B Vaccine  Aged Out    Discussed health benefits of physical activity, and encouraged him to engage in regular exercise appropriate for his age and condition.  Problem List Items Addressed This Visit   None  No follow-ups on file.     Saddie JULIANNA Sacks, PA-C

## 2023-10-01 ENCOUNTER — Ambulatory Visit (INDEPENDENT_AMBULATORY_CARE_PROVIDER_SITE_OTHER)

## 2023-10-01 VITALS — BP 95/59 | HR 72 | Temp 97.5°F | Ht 68.0 in | Wt 163.1 lb

## 2023-10-01 DIAGNOSIS — E785 Hyperlipidemia, unspecified: Secondary | ICD-10-CM

## 2023-10-01 DIAGNOSIS — G4733 Obstructive sleep apnea (adult) (pediatric): Secondary | ICD-10-CM | POA: Diagnosis not present

## 2023-10-01 DIAGNOSIS — E0849 Diabetes mellitus due to underlying condition with other diabetic neurological complication: Secondary | ICD-10-CM | POA: Diagnosis not present

## 2023-10-01 DIAGNOSIS — Z95811 Presence of heart assist device: Secondary | ICD-10-CM | POA: Insufficient documentation

## 2023-10-01 DIAGNOSIS — F419 Anxiety disorder, unspecified: Secondary | ICD-10-CM

## 2023-10-01 DIAGNOSIS — Z0000 Encounter for general adult medical examination without abnormal findings: Secondary | ICD-10-CM | POA: Diagnosis not present

## 2023-10-01 DIAGNOSIS — I4819 Other persistent atrial fibrillation: Secondary | ICD-10-CM | POA: Diagnosis not present

## 2023-10-01 DIAGNOSIS — I10 Essential (primary) hypertension: Secondary | ICD-10-CM | POA: Diagnosis not present

## 2023-10-01 NOTE — Assessment & Plan Note (Signed)
Stable and well-controlled.  Continue Cymbalta 60 mg daily.

## 2023-10-01 NOTE — Patient Instructions (Signed)
 It was nice to see you today!  As we discussed in clinic:  -Continue all of your current medications as prescribed -All of your lab work looked great or was stable from last year. We can recheck labs in 6 months and make sure that bilirubin has normalized.  -Try to aim for 80 oz or more of water per day to support hydration and blood pressure. Monitor blood pressure at home for me and notify me or cardiology if it continues to run low so we can make adjustments to your medications. -You are up to date on all of your other health screenings.   I will plan to see you back in 6 months for a follow up with labs the week  before! It was nice to meet you!  If you have any problems before your next visit feel free to message me via MyChart (minor issues or questions) or call the office, otherwise you may reach out to schedule an office visit.  Thank you! Saddie Sacks, PA-C

## 2023-10-01 NOTE — Assessment & Plan Note (Signed)
 Follows with cardiology.  Reports that he is due for battery change next year.

## 2023-10-01 NOTE — Assessment & Plan Note (Signed)
 Stable.  Continue regular use of CPAP machine at night.

## 2023-10-01 NOTE — Assessment & Plan Note (Signed)
 Most recent A1c 5.7.  Patient not currently on glucose lowering medications.  Will continue controlling with diet/exercise/lifestyle changes.  Will continue to monitor.

## 2023-10-01 NOTE — Assessment & Plan Note (Signed)
 Blood pressure goal <130/80.  Blood pressure soft today in office at 95/59 and on recheck.  Continue carvedilol  25 mg twice daily, Lasix  20 mg daily, losartan  12.5 mg daily, spironolactone  25 mg daily for now.  Advised patient to participate in ambulatory blood pressure monitoring and notify if blood pressure remains low and to notify so we can make adjustments to his blood pressure medication.  Continue regular follow-up with cardiology.  CMP within normal limits.  Will continue to monitor.

## 2023-10-01 NOTE — Assessment & Plan Note (Signed)
 Last lipid panel: LDL 94, HDL 48, triglycerides 123.  Stable.  Continue atorvastatin  20 mg daily.  Last CMP within normal limits.  Will continue to monitor.

## 2023-10-01 NOTE — Assessment & Plan Note (Signed)
 Followed by cardiology.  Encouraged him to continue regular follow-up.  Continue carvedilol  and Eliquis  as prescribed.

## 2023-10-01 NOTE — Assessment & Plan Note (Signed)
 Went over lab work in detail with the patient. Answered all patient questions. Went over and encouraged/ordered age-appropriate health screenings. Patient is up-to-date on all other screenings. No vaccines today. Encouraged patient to aim for 150+ minutes of physical activity per week or just increase their physical activity in general in combination with a well balanced diet that prioritizes protein and fiber to support a healthy lifestyle. Will plan for next physical in 1 year, sooner PRN. Patient verbalized understanding and was in agreement with the plan.

## 2023-10-09 ENCOUNTER — Ambulatory Visit: Payer: Medicare HMO

## 2023-10-09 DIAGNOSIS — Z0000 Encounter for general adult medical examination without abnormal findings: Secondary | ICD-10-CM | POA: Diagnosis not present

## 2023-10-09 NOTE — Progress Notes (Signed)
 Subjective:   George [name] is a 73 y.o. who presents for a Medicare Wellness preventive visit.  As a reminder, Annual Wellness Visits don't include a physical exam, and some assessments may be limited, especially if this visit is performed virtually. We may recommend an in-person follow-up visit with your provider if needed.  Visit Complete: Virtual I connected with  George [name] on 10/09/23 by a audio enabled telemedicine application and verified that I am speaking with the correct person using two identifiers.  Patient Location: Home  Provider Location: Home Office  I discussed the limitations of evaluation and management by telemedicine. The patient expressed understanding and agreed to proceed.  Vital Signs: Because this visit was a virtual/telehealth visit, some criteria may be missing or patient reported. Any vitals not documented were not able to be obtained and vitals that have been documented are patient reported.  VideoError- Librarian, academic were attempted between this provider and patient, however failed, due to patient having technical difficulties OR patient did not have access to video capability.  We continued and completed visit with audio only.   Persons Participating in Visit: Patient.  AWV Questionnaire: No: Patient Medicare AWV questionnaire was not completed prior to this visit.  Cardiac Risk Factors include: advanced age (>36men, >58 women);dyslipidemia;hypertension;male gender     Objective:    Today's Vitals   There is no height or weight on file to calculate BMI.     10/09/2023   10:13 AM 09/17/2023    1:35 PM 10/03/2022   10:21 AM 10/02/2022   10:18 AM 04/03/2022    7:48 AM 05/17/2021    8:17 PM 02/25/2021    8:00 PM  Advanced Directives  Does Patient Have a Medical Advance Directive? Yes No No No Yes No No  Type of Estate agent of [place];Living will    Healthcare Power of [place];Living will     Copy of Healthcare Power of Attorney in Chart? No - copy requested        Would patient like information on creating a medical advance directive?   No - Patient declined No - Patient declined  No - Patient declined No - Patient declined    Current Medications (verified) Outpatient Encounter Medications as of 10/09/2023  Medication Sig   atorvastatin  (LIPITOR) 20 MG tablet Take 1 tablet (20 mg total) by mouth 2 (two) times a week. Take 1 tablet by mouth 2 times per week.   carvedilol  (COREG ) 25 MG tablet TAKE 1 TABLET (25 MG TOTAL) BY MOUTH TWICE A DAY WITH MEALS   Cholecalciferol (D3) 25 MCG (1000 UT) capsule Take 1,000 Units by mouth daily.   clotrimazole -betamethasone  (LOTRISONE ) cream Apply 1 Application topically as needed (for skin).   cyanocobalamin  (VITAMIN B12) 1000 MCG tablet Take 1,000 mcg by mouth daily.   dofetilide  (TIKOSYN ) 500 MCG capsule Take 1 capsule (500 mcg total) by mouth 2 (two) times daily.   DULoxetine  (CYMBALTA ) 60 MG capsule TAKE 1 CAPSULE (60 MG TOTAL) BY MOUTH AT BEDTIME.   ELIQUIS  5 MG TABS tablet TAKE 1 TABLET BY MOUTH TWICE A DAY   fexofenadine  (ALLEGRA ) 180 MG tablet TAKE 1 TABLET (180 MG TOTAL) BY MOUTH DAILY. DURING ALLERGY SEASON   fluticasone  (FLONASE ) 50 MCG/ACT nasal spray USE 1 SPRAY IN EACH NOSTRIL AFTER SINUS RINSES TWICE DAILY.   furosemide  (LASIX ) 20 MG tablet TAKE 1 TABLET BY MOUTH EVERY DAY   hydrocortisone  (ANUSOL -HC) 2.5 % rectal cream Place 1 Application  rectally 2 (two) times daily.   losartan  (COZAAR ) 25 MG tablet TAKE 1/2 TABLET BY MOUTH DAILY   Magnesium  400 MG CAPS Take 1 capsule by mouth every evening.   Multiple Vitamins-Minerals (CENTRUM ADULTS) TABS Take 1 tablet by mouth daily.   spironolactone  (ALDACTONE ) 25 MG tablet TAKE 1 TABLET (25 MG TOTAL) BY MOUTH DAILY.   tamsulosin  (FLOMAX ) 0.4 MG CAPS capsule Take 0.4 mg by mouth daily.   No facility-administered encounter medications on file as of 10/09/2023.    Allergies  (verified) Patient has no known allergies.   History: Past Medical History:  Diagnosis Date   AICD (automatic cardioverter/defibrillator) present    Arthritis THUMBS   LBBB (left bundle branch block)    bilat. BBB- PPM   OSA (obstructive sleep apnea) 05/17/2021   Peyronie's disease    Presence of permanent cardiac pacemaker    Past Surgical History:  Procedure Laterality Date   CARDIOVASCULAR STRESS TEST  10-18-2002  DR  CRENSHAW   NORMAL ADENOSINE CARDIOLITE/ EF 60%   CARDIOVERSION N/A 08/18/2020   Procedure: CARDIOVERSION;  Surgeon: Barbaraann Darryle Ned, MD;  Location[facility] ENDOSCOPY;  Service: Cardiovascular;  Laterality: N/A;   CARDIOVERSION N/A 11/15/2020   Procedure: CARDIOVERSION;  Surgeon: Shlomo Wilbert SAUNDERS, MD;  Location: [facility] ENDOSCOPY;  Service: Cardiovascular;  Laterality: N/A;   CATARACT EXTRACTION W/ INTRAOCULAR LENS IMPLANT  03/18/2004   LEFT EYE   EP IMPLANTABLE DEVICE N/A 10/18/2015   Procedure: BiV Pacemaker Insertion CRT-P;  Surgeon: Elspeth JAYSON Sage, MD;  Location: [facility] INVASIVE CV LAB;  Service: Cardiovascular;  Laterality: N/A;   INGUINAL HERNIA REPAIR Right 02/23/2019   Procedure: RIGHT INGUINAL HERNIA REPAIR WITH MESH;  Surgeon: Vanderbilt Ned, MD;  Location: [facility];  Service: General;  Laterality: Right;   LAPAROSCOPIC CHOLECYSTECTOMY  10/09/2007   LEFT HEART CATH AND CORONARY ANGIOGRAPHY N/A 01/30/2018   Procedure: LEFT HEART CATH AND CORONARY ANGIOGRAPHY;  Surgeon: Claudene Victory ORN, MD;  Location: [facility] INVASIVE CV LAB;  Service: Cardiovascular;  Laterality: N/A;   NESBIT PROCEDURE  01/17/2012   Procedure: NESBIT PROCEDURE;  Surgeon: Oneil JAYSON Rafter, MD;  Location: [facility];  Service: Urology;  Laterality: N/A;  16 dot plication    REPAIR DETACHED RETINA, LEFT EYE  03/19/1991   REPAIR LEFT INGUINAL HERNIA  03/19/2003   testes surgery     11/2022   Family History  Problem Relation Age of Onset   Diabetes Mother    Hypertension  Mother    Hyperlipidemia Mother    Heart disease Father    Hyperlipidemia Father    Hypertension Father    Arrhythmia Father        paf   Healthy Daughter    Healthy Son    Heart disease Paternal Grandfather    Heart attack Paternal Grandfather    Social History   Socioeconomic History   Marital status: Married    Spouse name: Not on file   Number of children: 2   Years of education: 12   Highest education level: Not on file  Occupational History   Not on file  Tobacco Use   Smoking status: Never    Passive exposure: Never   Smokeless tobacco: Never  Vaping Use   Vaping status: Never Used  Substance and Sexual Activity   Alcohol use: No   Drug use: No   Sexual activity: Yes  Other Topics Concern   Not on file  Social History Narrative   Right handed  Lives with wife      What is your current occupation?retired   Do you live at home alone?;lives with wife   One story home   Caffeine 1/2 liter a day    Social Drivers of Corporate investment banker Strain: Low Risk  (10/09/2023)   Overall Financial Resource Strain (CARDIA)    Difficulty of Paying Living Expenses: Not hard at all  Food Insecurity: No Food Insecurity (10/09/2023)   Hunger Vital Sign    Worried About Running Out of Food in the Last Year: Never true    Ran Out of Food in the Last Year: Never true  Transportation Needs: No Transportation Needs (10/09/2023)   PRAPARE - Administrator, Civil Service (Medical): No    Lack of Transportation (Non-Medical): No  Physical Activity: Insufficiently Active (10/09/2023)   Exercise Vital Sign    Days of Exercise per Week: 2 days    Minutes of Exercise per Session: 50 min  Stress: No Stress Concern Present (10/09/2023)   [employer] of Occupational Health - Occupational Stress Questionnaire    Feeling of Stress: Not at all  Social Connections: Moderately Integrated (10/09/2023)   Social Connection and Isolation Panel    Frequency of  Communication with Friends and Family: More than three times a week    Frequency of Social Gatherings with Friends and Family: More than three times a week    Attends Religious Services: More than 4 times per year    Active Member of [employer] or Organizations: No    Attends Banker Meetings: Never    Marital Status: Married    Tobacco Counseling Counseling given: Not Answered    Clinical Intake:  Pre-visit preparation completed: Yes  Pain : No/denies pain     Nutritional Risks: None Diabetes: No  Lab Results  Component Value Date   HGBA1C 5.7 (H) 09/24/2023   HGBA1C 5.8 (H) 11/09/2021   HGBA1C 5.6 05/03/2021     How often do you need to have someone help you when you read instructions, pamphlets, or other written materials from your doctor or pharmacy?: 1 - Never  Interpreter Needed?: No  Information entered by :: NAllen LPN   Activities of Daily Living     10/09/2023   10:07 AM  In your present state of health, do you have any difficulty performing the following activities:  Hearing? 0  Vision? 0  Difficulty concentrating or making decisions? 1  Comment memory sometimes (names)  Walking or climbing stairs? 0  Dressing or bathing? 0  Doing errands, shopping? 0  Preparing Food and eating ? N  Using the Toilet? N  In the past six months, have you accidently leaked urine? N  Do you have problems with loss of bowel control? N  Managing your Medications? N  Managing your Finances? N  Housekeeping or managing your Housekeeping? N    Patient Care Team: Gayle Saddie JULIANNA DEVONNA as PCP - General (Physician Assistant) Fernande Elspeth BROCKS, MD as PCP - Cardiology (Cardiology) Fernande Elspeth BROCKS, MD as PCP - Electrophysiology (Cardiology) Lennard Lesta JULIANNA, MD as Consulting Physician (Gastroenterology) Shona Rush, MD as Consulting Physician (Dermatology) Pa, [facility] as Consulting Physician (Optometry) Fernande Elspeth BROCKS, MD as Consulting Physician  (Cardiology) Georjean Darice HERO, MD as Consulting Physician (Neurology)  I have updated your Care Teams any recent Medical Services you may have received from other providers in the past year.     Assessment:   This  is a routine wellness examination for George [name].  Hearing/Vision screen Hearing Screening - Comments:: Denies hearing issues Vision Screening - Comments:: Regular eye exams, [facility]   Goals Addressed             This Visit's Progress    Patient Stated       10/09/2023, stay alive       Depression Screen     10/09/2023   10:15 AM 10/01/2023   10:15 AM 12/02/2022    9:19 AM 10/03/2022   10:19 AM 08/01/2022    2:16 PM 04/30/2022    8:37 AM 01/28/2022    1:51 PM  PHQ 2/9 Scores  PHQ - 2 Score 0 0 1 0 0 0 1  PHQ- 9 Score 0 0 2  0 2 3    Fall Risk     10/09/2023   10:14 AM 10/01/2023   10:15 AM 09/17/2023    1:35 PM 10/03/2022   10:20 AM 10/02/2022   10:18 AM  Fall Risk   Falls in the past year? 1 1 1 1 1   Comment due to feet      Number falls in past yr: 1 1 1  0 1  Injury with Fall? 0 0 0 0 0  Risk for fall due to : History of fall(s);Impaired balance/gait;Medication side effect Other (Comment)  No Fall Risks   Follow up Falls prevention discussed;Falls evaluation completed Falls evaluation completed Falls evaluation completed Falls prevention discussed Falls evaluation completed    MEDICARE RISK AT HOME:  Medicare Risk at Home Any stairs in or around the home?: Yes If so, are there any without handrails?: No Home free of loose throw rugs in walkways, pet beds, electrical cords, etc?: Yes Adequate lighting in your home to reduce risk of falls?: Yes Life alert?: No Use of a cane, walker or w/c?: No Grab bars in the bathroom?: Yes Shower chair or bench in shower?: Yes Elevated toilet seat or a handicapped toilet?: Yes  TIMED UP AND GO:  Was the test performed?  No  Cognitive Function: 6CIT completed    01/21/2020    9:59 AM  MMSE - Mini Mental State  Exam  Orientation to time 5  Orientation to Place 5  Registration 3  Attention/ Calculation 5  Recall 2  Language- name 2 objects 2  Language- repeat 1  Language- follow 3 step command 3  Language- read & follow direction 1  Write a sentence 1  Copy design 1  Total score 29      02/08/2020   10:00 AM  Montreal Cognitive Assessment   Visuospatial/ Executive (0/5) 4  Naming (0/3) 3  Attention: Read list of digits (0/2) 2  Attention: Read list of letters (0/1) 1  Attention: Serial 7 subtraction starting at 100 (0/3) 3  Language: Repeat phrase (0/2) 1  Language : Fluency (0/1) 1  Abstraction (0/2) 0  Delayed Recall (0/5) 3  Orientation (0/6) 6  Total 24  Adjusted Score (based on education) 25      10/09/2023   10:15 AM 10/03/2022   10:21 AM 12/13/2020   11:17 AM 03/08/2020    1:11 PM 01/21/2020    9:58 AM  6CIT Screen  What Year? 0 points 0 points 0 points 0 points 0 points  What month? 0 points 0 points 0 points 0 points 0 points  What time? 0 points 0 points 0 points 0 points 0 points  Count back from 20 0 points 0 points  0 points 0 points 0 points  Months in reverse 0 points 0 points 0 points 0 points 0 points  Repeat phrase 0 points 0 points 2 points 2 points 0 points  Total Score 0 points 0 points 2 points 2 points 0 points    Immunizations Immunization History  Administered Date(s) Administered   PFIZER(Purple Top)SARS-COV-2 Vaccination 05/11/2019, 06/01/2019, 12/28/2019   Td 03/18/2006   Tdap 11/13/2016    Screening Tests Health Maintenance  Topic Date Due   FOOT EXAM  Never done   OPHTHALMOLOGY EXAM  Never done   Diabetic kidney evaluation - Urine ACR  Never done   Pneumococcal Vaccine: 50+ Years (1 of 2 - PCV) Never done   Zoster Vaccines- Shingrix (1 of 2) Never done   COVID-19 Vaccine (4 - 2024-25 season) 11/17/2022   Hepatitis C Screening  11/11/2028 (Originally 02/09/1969)   INFLUENZA VACCINE  10/17/2023   HEMOGLOBIN A1C  03/26/2024   Diabetic  kidney evaluation - eGFR measurement  09/23/2024   Medicare Annual Wellness (AWV)  10/08/2024   DTaP/Tdap/Td (3 - Td or Tdap) 11/14/2026   Colonoscopy  09/10/2032   Hepatitis B Vaccines  Aged Out   HPV VACCINES  Aged Out   Meningococcal B Vaccine  Aged Out    Health Maintenance  Health Maintenance Due  Topic Date Due   FOOT EXAM  Never done   OPHTHALMOLOGY EXAM  Never done   Diabetic kidney evaluation - Urine ACR  Never done   Pneumococcal Vaccine: 50+ Years (1 of 2 - PCV) Never done   Zoster Vaccines- Shingrix (1 of 2) Never done   COVID-19 Vaccine (4 - 2024-25 season) 11/17/2022   Health Maintenance Items Addressed: Declines vaccines. Patient states prediabetic.  Additional Screening:  Vision Screening: Recommended annual ophthalmology exams for early detection of glaucoma and other disorders of the eye. Would you like a referral to an eye doctor? No    Dental Screening: Recommended annual dental exams for proper oral hygiene  Community Resource Referral / Chronic Care Management: CRR required this visit?  No   CCM required this visit?  No   Plan:    I have personally reviewed and noted the following in the patient's chart:   Medical and social history Use of alcohol, tobacco or illicit drugs  Current medications and supplements including opioid prescriptions. Patient is not currently taking opioid prescriptions. Functional ability and status Nutritional status Physical activity Advanced directives List of other physicians Hospitalizations, surgeries, and ER visits in previous 12 months Vitals Screenings to include cognitive, depression, and falls Referrals and appointments  In addition, I have reviewed and discussed with patient certain preventive protocols, quality metrics, and best practice recommendations. A written personalized care plan for preventive services as well as general preventive health recommendations were provided to patient.   Ardella FORBES Dawn, LPN   2/75/[id number]   After Visit Summary: (MyChart) Due to this being a telephonic visit, the after visit summary with patients personalized plan was offered to patient via MyChart   Notes: Nothing significant to report at this time.

## 2023-10-09 NOTE — Patient Instructions (Signed)
 [name] , Thank you for taking time out of your busy schedule to complete your Annual Wellness Visit with me. I enjoyed our conversation and look forward to speaking with you again next year. I, as well as your care team,  appreciate your ongoing commitment to your health goals. Please review the following plan we discussed and let me know if I can assist you in the future. Your Game plan/ To Do List    Referrals: If you haven't heard from the office you've been referred to, please reach out to them at the phone provided.  N/a Follow up Visits: Next Medicare AWV with our clinical staff: 12/02/2024 at 10:10   Have you seen your provider in the last 6 months (3 months if uncontrolled diabetes)? Yes Next Office Visit with your provider: 04/02/2024 at 10:10  Clinician Recommendations:  Aim for 30 minutes of exercise or brisk walking, 6-8 glasses of water, and 5 servings of fruits and vegetables each day.       This is a list of the screening recommended for you and due dates:  Health Maintenance  Topic Date Due   Complete foot exam   Never done   Eye exam for diabetics  Never done   Yearly kidney health urinalysis for diabetes  Never done   Pneumococcal Vaccine for age over 18 (1 of 2 - PCV) Never done   Zoster (Shingles) Vaccine (1 of 2) Never done   COVID-19 Vaccine (4 - 2024-25 season) 11/17/2022   Hepatitis C Screening  11/11/2028*   Flu Shot  10/17/2023   Hemoglobin A1C  03/26/2024   Yearly kidney function blood test for diabetes  09/23/2024   Medicare Annual Wellness Visit  10/08/2024   DTaP/Tdap/Td vaccine (3 - Td or Tdap) 11/14/2026   Colon Cancer Screening  09/10/2032   Hepatitis B Vaccine  Aged Out   HPV Vaccine  Aged Out   Meningitis B Vaccine  Aged Out  *Topic was postponed. The date shown is not the original due date.    Advanced directives: (Copy Requested) Please bring a copy of your health care power of attorney and living will to the office to be added to your chart  at your convenience. You can mail to [facility] [address] [place], [place] [address] or email to [email or web address] Advance Care Planning is important because it:  [x]  Makes sure you receive the medical care that is consistent with your values, goals, and preferences  [x]  It provides guidance to your family and loved ones and reduces their decisional burden about whether or not they are making the right decisions based on your wishes.  Follow the link provided in your after visit summary or read over the paperwork we have mailed to you to help you started getting your Advance Directives in place. If you need assistance in completing these, please reach out to us  so that we can help you!  See attachments for Preventive Care and Fall Prevention Tips.

## 2023-10-22 ENCOUNTER — Ambulatory Visit (INDEPENDENT_AMBULATORY_CARE_PROVIDER_SITE_OTHER)

## 2023-10-22 DIAGNOSIS — I428 Other cardiomyopathies: Secondary | ICD-10-CM

## 2023-10-23 LAB — CUP PACEART REMOTE DEVICE CHECK
Battery Remaining Longevity: 10 mo
Battery Voltage: 2.72 V
Brady Statistic AP VP Percent: 22.35 %
Brady Statistic AP VS Percent: 0.08 %
Brady Statistic AS VP Percent: 76.42 %
Brady Statistic AS VS Percent: 1.15 %
Brady Statistic RA Percent Paced: 23.11 %
Brady Statistic RV Percent Paced: 98.77 %
Date Time Interrogation Session: 20250805223529
Implantable Lead Connection Status: 753985
Implantable Lead Connection Status: 753985
Implantable Lead Connection Status: 753985
Implantable Lead Implant Date: 20170802
Implantable Lead Implant Date: 20170802
Implantable Lead Implant Date: 20170802
Implantable Lead Location: 753858
Implantable Lead Location: 753859
Implantable Lead Location: 753860
Implantable Lead Model: 5076
Implantable Lead Model: 5076
Implantable Pulse Generator Implant Date: 20170802
Lead Channel Impedance Value: 1007 Ohm
Lead Channel Impedance Value: 342 Ohm
Lead Channel Impedance Value: 399 Ohm
Lead Channel Impedance Value: 399 Ohm
Lead Channel Impedance Value: 437 Ohm
Lead Channel Impedance Value: 475 Ohm
Lead Channel Impedance Value: 475 Ohm
Lead Channel Impedance Value: 494 Ohm
Lead Channel Impedance Value: 627 Ohm
Lead Channel Impedance Value: 722 Ohm
Lead Channel Impedance Value: 741 Ohm
Lead Channel Impedance Value: 874 Ohm
Lead Channel Impedance Value: 893 Ohm
Lead Channel Impedance Value: 988 Ohm
Lead Channel Pacing Threshold Amplitude: 0.625 V
Lead Channel Pacing Threshold Amplitude: 1.25 V
Lead Channel Pacing Threshold Amplitude: 1.5 V
Lead Channel Pacing Threshold Pulse Width: 0.4 ms
Lead Channel Pacing Threshold Pulse Width: 0.4 ms
Lead Channel Pacing Threshold Pulse Width: 0.8 ms
Lead Channel Sensing Intrinsic Amplitude: 18.875 mV
Lead Channel Sensing Intrinsic Amplitude: 18.875 mV
Lead Channel Sensing Intrinsic Amplitude: 2.25 mV
Lead Channel Sensing Intrinsic Amplitude: 2.25 mV
Lead Channel Setting Pacing Amplitude: 1.5 V
Lead Channel Setting Pacing Amplitude: 2 V
Lead Channel Setting Pacing Amplitude: 2.5 V
Lead Channel Setting Pacing Pulse Width: 0.4 ms
Lead Channel Setting Pacing Pulse Width: 0.8 ms
Lead Channel Setting Sensing Sensitivity: 2.8 mV
Zone Setting Status: 755011
Zone Setting Status: 755011

## 2023-11-20 ENCOUNTER — Telehealth: Payer: Self-pay | Admitting: Neurology

## 2023-11-20 ENCOUNTER — Encounter: Payer: Self-pay | Admitting: Physician Assistant

## 2023-11-20 NOTE — Telephone Encounter (Signed)
 Called pt and let him know as of now provider is full today and tomorrow and rescheduled him to Monday Morning.

## 2023-11-20 NOTE — Telephone Encounter (Signed)
 Pt is stating that his legs feel like they are going to fallout from under him, they are weaker. He is also stating that his joints are hurting all over. They would like to know what they can do, or if they need to be seen by the PCP , us  or who

## 2023-11-20 NOTE — Telephone Encounter (Signed)
 Copied from CRM #[id number]. Topic: Appointments - Scheduling Inquiry for Clinic >> Nov 20, 2023  3:12 PM Teressa P wrote: Reason for CRM: patients wife called saying her husband is having joint pain and wants to be seen asap.  The first appt is seot 15.  Please call them back asap  [phone]

## 2023-11-21 NOTE — Telephone Encounter (Signed)
 Called patient and he stated he is seeing his PCP on Monday in regards to below. Patient will contact us  if he needs to be seen by Dr. Leigh. Patient had no further questions or concerns.

## 2023-11-24 ENCOUNTER — Ambulatory Visit (INDEPENDENT_AMBULATORY_CARE_PROVIDER_SITE_OTHER)

## 2023-11-24 VITALS — BP 92/62 | HR 78 | Wt 168.0 lb

## 2023-11-24 DIAGNOSIS — G6289 Other specified polyneuropathies: Secondary | ICD-10-CM | POA: Diagnosis not present

## 2023-11-24 DIAGNOSIS — G629 Polyneuropathy, unspecified: Secondary | ICD-10-CM | POA: Insufficient documentation

## 2023-11-24 MED ORDER — GABAPENTIN 100 MG PO CAPS: 100.0000 mg | ORAL_CAPSULE | Freq: Every day | ORAL | 3 refills | Status: AC

## 2023-11-24 NOTE — Assessment & Plan Note (Signed)
 Chronic idiopathic peripheral neuropathy with restless legs and joint pain, confirmed by nerve conduction study. No diabetes or vascular issues. - Initiated gabapentin  100 mg at bedtime, increase to 200 mg or 300 mg as needed. Discussed potential sedation. Also advised not to dose above 300 mg without coming in to the office to be re-evaluated. - Continue duloxetine  60 mg, consider increase if gabapentin  insufficient. - Emphasized symptom management to prevent mobility aid dependence. - Follow up with neurologist Dr. Leigh every six months. - Maintain supportive footwear.

## 2023-11-24 NOTE — Progress Notes (Signed)
 Acute Office Visit  Subjective:     Patient ID: DOC MANDALA, male    DOB: 1950/10/26, 73 y.o.   MRN: [id number]  Chief Complaint  Patient presents with   Acute Visit    Patient in room #4 with his wife. Patient states he's having bilateral leg and feet joints pain.    HPI  History of Present Illness   CLEVLAND CORK is a 73 year old male with neuropathy who presents with worsening joint pain and neuropathy symptoms.  Peripheral neuropathy symptoms - Worsening neuropathy symptoms over the past 4-5 weeks - Burning and stinging sensation in the feet, described as feeling 'on fire' - Restless legs at night, disrupting sleep - Wife observes nocturnal movements, including 'jumping' and shaking the bed - Nerve conduction study in 2023 confirmed extensive neuropathy - Wears supportive footwear designed for neuropathy  Lower extremity arthralgia - Worsening joint pain over the past 4-5 weeks - Pain localized to lower legs, knees, and ankles  Vitamin d3 supplementation - Associates joint pain with vitamin D3 intake, with prior improvement upon discontinuation - Resumed vitamin D3 due to documented low levels  Medication use - Takes duloxetine  for anxiety, increased from 20 mg to 60 mg over time - Discontinued tamsulosin  three months ago, not believed to be related to current symptoms - Has been on atorvastatin  for several years   Constitutional and infectious symptoms - No recent illnesses such as coughing, congestion, or runny nose  - Denies redness, fevers, swelling, etc.  - No recent known injuries.        ROS Per HPI     Objective:    BP 92/62 (BP Location: Left Arm, Patient Position: Sitting, Cuff Size: Small)   Pulse 78   Wt 168 lb (76.2 kg)   SpO2 99%   BMI 25.54 kg/m    Physical Exam Constitutional:      General: He is not in acute distress.    Appearance: Normal appearance.  Cardiovascular:     Rate and Rhythm: Normal rate and regular rhythm.      Pulses:          Dorsalis pedis pulses are 2+ on the right side and 2+ on the left side.     Heart sounds: Normal heart sounds. No murmur heard.    No friction rub. No gallop.  Pulmonary:     Effort: Pulmonary effort is normal. No respiratory distress.     Breath sounds: Normal breath sounds.  Musculoskeletal:        General: No swelling.     Right lower leg: Normal. No tenderness.     Left lower leg: Normal. No tenderness.     Right foot: Normal range of motion. No deformity.     Left foot: Normal range of motion. No deformity.  Feet:     Right foot:     Protective Sensation: 10 sites tested.  0 sites sensed.     Skin integrity: Skin integrity normal.     Left foot:     Protective Sensation: 10 sites tested.  0 sites sensed.     Skin integrity: Skin integrity normal.  Skin:    General: Skin is warm and dry.  Neurological:     General: No focal deficit present.     Mental Status: He is alert.  Psychiatric:        Mood and Affect: Mood normal.        Behavior: Behavior normal.  Thought Content: Thought content normal.      No results found for any visits on 11/24/23.      Assessment & Plan:      Idiopathic peripheral neuropathy with associated restless legs symptoms and chronic joint pain Chronic idiopathic peripheral neuropathy with restless legs and joint pain, confirmed by nerve conduction study. No diabetes or vascular issues. - Initiated gabapentin  100 mg at bedtime, increase to 200 mg or 300 mg as needed. Discussed potential sedation. - Continue duloxetine  60 mg, consider increase if gabapentin  insufficient. - Emphasized symptom management to prevent mobility aid dependence. - Follow up with neurologist Dr. Leigh every six months. - Maintain supportive footwear.       Return if symptoms worsen or fail to improve.  Saddie JULIANNA Sacks, PA-C

## 2023-11-24 NOTE — Patient Instructions (Signed)
 VISIT SUMMARY: During today's visit, we discussed your worsening joint pain and neuropathy symptoms. We reviewed your current medications and recent health changes, including your recent fall and vitamin D3 supplementation. We also talked about your restless legs symptoms and the impact on your sleep.  YOUR PLAN: -IDIOPATHIC PERIPHERAL NEUROPATHY WITH ASSOCIATED RESTLESS LEGS SYMPTOMS AND CHRONIC JOINT PAIN: Idiopathic peripheral neuropathy is a condition where the nerves are damaged without a known cause, leading to symptoms like burning and stinging sensations in the feet, restless legs, and joint pain. We have started you on gabapentin  100 mg at bedtime, which you can increase to 200 mg or 300 mg as needed. This medication may cause sedation, so please be cautious. Continue taking duloxetine  60 mg, and we may consider increasing the dose if gabapentin  is not enough. It's important to manage your symptoms to avoid needing mobility aids. Please continue wearing your supportive footwear.  INSTRUCTIONS: Please follow up with your neurologist, Dr. Leigh, every six months to monitor your condition and adjust treatment as needed.  If you have any problems before your next visit feel free to message me via MyChart (minor issues or questions) or call the office, otherwise you may reach out to schedule an office visit.  Thank you! Saddie Sacks, PA-C

## 2023-12-02 ENCOUNTER — Ambulatory Visit

## 2023-12-08 ENCOUNTER — Other Ambulatory Visit: Payer: Self-pay

## 2023-12-09 MED ORDER — FUROSEMIDE 20 MG PO TABS: 20.0000 mg | ORAL_TABLET | Freq: Every day | ORAL | 1 refills | Status: DC

## 2023-12-09 MED ORDER — LOSARTAN POTASSIUM 25 MG PO TABS: 12.5000 mg | ORAL_TABLET | Freq: Every day | ORAL | 0 refills | Status: DC

## 2023-12-10 DIAGNOSIS — G4733 Obstructive sleep apnea (adult) (pediatric): Secondary | ICD-10-CM | POA: Diagnosis not present

## 2023-12-15 NOTE — Progress Notes (Signed)
 Remote PPM Transmission

## 2023-12-21 NOTE — Progress Notes (Unsigned)
 Cardiology Office Note Date:  12/21/2023  Patient ID:  George [name], George [name] [birth year], MRN [id number] PCP:  Gayle Saddie FALCON, PA-C  Cardiologist:  Dr. Shlomo Electrophysiologist: Dr. Fernande >>>     Chief Complaint:   *** 6 mo  History of Present Illness: George [name] is a 73 y.o. male with history of  HTN, RBBB 2nd degree AVblock,II, s/p CRT-P,  ATach, NSVT, AFib NICM   Seeing EP team including myself over the years  Dr. Fernande 04/08/22, had traveled to [place], had a lightheaded spell, unclear cause, not recurrent  Saw Brandi 11/15/22, sruggling w/peripheral neuropathy, noted an episode of slow NSVTs and VT rate reduced RV oPW increased to keep 2:1 Stable QTc, 3.2% AF burden  Saw Jodie 12/20/22, feeling very well, no cardiac complaints/concerns, no VT/NSVTs, no changes made  I saw him 06/18/23 He is accompanied by his wife He is doing OK, generally well Occasionally more winded then other times, typically though with heavier activities No rest SOB No positional/nocturnal SOB Noticed soe swelling ankles for a couple days lately No CP, palpitations No near syncope or syncope No bleeding or signs of bleeding  Follows with neurology for neuropathy  TODAY  *** tikosyn  labs, EKG *** % BP *** symptoms*** volume *** needs to reestablish w/gen cards *** new EP MD   Device information:  MDT CRT-P, implanted 10/18/15, Dr. Fernande  AFib Hx Found via device, May 2022  AAD Tikosyn , started 11/2020   Past Medical History:  Diagnosis Date   AICD (automatic cardioverter/defibrillator) present    Arthritis THUMBS   LBBB (left bundle branch block)    bilat. BBB- PPM   OSA (obstructive sleep apnea) 05/17/2021   Peyronie's disease    Presence of permanent cardiac pacemaker     Past Surgical History:  Procedure Laterality Date   CARDIOVASCULAR STRESS TEST  10-18-2002  DR  CRENSHAW   NORMAL ADENOSINE CARDIOLITE/ EF 60%   CARDIOVERSION N/A 08/18/2020   Procedure: CARDIOVERSION;   Surgeon: Barbaraann Darryle Ned, MD;  [facility] ENDOSCOPY;  Service: Cardiovascular;  Laterality: N/A;   CARDIOVERSION N/A 11/15/2020   Procedure: CARDIOVERSION;  Surgeon: Shlomo Wilbert SAUNDERS, MD;  Location: [facility] ENDOSCOPY;  Service: Cardiovascular;  Laterality: N/A;   CATARACT EXTRACTION W/ INTRAOCULAR LENS IMPLANT  03/18/2004   LEFT EYE   EP IMPLANTABLE DEVICE N/A 10/18/2015   Procedure: BiV Pacemaker Insertion CRT-P;  Surgeon: Elspeth JAYSON Fernande, MD;  Location: [facility] INVASIVE CV LAB;  Service: Cardiovascular;  Laterality: N/A;   INGUINAL HERNIA REPAIR Right 02/23/2019   Procedure: RIGHT INGUINAL HERNIA REPAIR WITH MESH;  Surgeon: Vanderbilt Ned, MD;  Location: [facility];  Service: General;  Laterality: Right;   LAPAROSCOPIC CHOLECYSTECTOMY  10/09/2007   LEFT HEART CATH AND CORONARY ANGIOGRAPHY N/A 01/30/2018   Procedure: LEFT HEART CATH AND CORONARY ANGIOGRAPHY;  Surgeon: Claudene Victory ORN, MD;  Location: [facility] INVASIVE CV LAB;  Service: Cardiovascular;  Laterality: N/A;   NESBIT PROCEDURE  01/17/2012   Procedure: NESBIT PROCEDURE;  Surgeon: Oneil JAYSON Rafter, MD;  Location: [facility];  Service: Urology;  Laterality: N/A;  16 dot plication    REPAIR DETACHED RETINA, LEFT EYE  03/19/1991   REPAIR LEFT INGUINAL HERNIA  03/19/2003   testes surgery     11/2022    Current Outpatient Medications  Medication Sig Dispense Refill   atorvastatin  (LIPITOR) 20 MG tablet Take 1 tablet (20 mg total) by mouth 2 (two) times a week. Take 1 tablet by  mouth 2 times per week. 24 tablet 3   carvedilol  (COREG ) 25 MG tablet TAKE 1 TABLET (25 MG TOTAL) BY MOUTH TWICE A DAY WITH MEALS 180 tablet 2   Cholecalciferol (D3) 25 MCG (1000 UT) capsule Take 1,000 Units by mouth daily.     clotrimazole -betamethasone  (LOTRISONE ) cream Apply 1 Application topically as needed (for skin). 30 g 11   cyanocobalamin  (VITAMIN B12) 1000 MCG tablet Take 1,000 mcg by mouth daily.     dofetilide  (TIKOSYN ) 500 MCG  capsule Take 1 capsule (500 mcg total) by mouth 2 (two) times daily. 180 capsule 2   DULoxetine  (CYMBALTA ) 60 MG capsule TAKE 1 CAPSULE (60 MG TOTAL) BY MOUTH AT BEDTIME. 90 capsule 3   ELIQUIS  5 MG TABS tablet TAKE 1 TABLET BY MOUTH TWICE A DAY 180 tablet 1   fexofenadine  (ALLEGRA ) 180 MG tablet TAKE 1 TABLET (180 MG TOTAL) BY MOUTH DAILY. DURING ALLERGY SEASON 90 tablet 1   fluticasone  (FLONASE ) 50 MCG/ACT nasal spray USE 1 SPRAY IN EACH NOSTRIL AFTER SINUS RINSES TWICE DAILY. 48 mL 1   furosemide  (LASIX ) 20 MG tablet Take 1 tablet (20 mg total) by mouth daily. 30 tablet 1   gabapentin  (NEURONTIN ) 100 MG capsule Take 1 capsule (100 mg total) by mouth at bedtime. 90 capsule 3   hydrocortisone  (ANUSOL -HC) 2.5 % rectal cream Place 1 Application rectally 2 (two) times daily. 30 g 1   losartan  (COZAAR ) 25 MG tablet Take 0.5 tablets (12.5 mg total) by mouth daily. 45 tablet 0   Magnesium  400 MG CAPS Take 1 capsule by mouth every evening.     Multiple Vitamins-Minerals (CENTRUM ADULTS) TABS Take 1 tablet by mouth daily.     spironolactone  (ALDACTONE ) 25 MG tablet TAKE 1 TABLET (25 MG TOTAL) BY MOUTH DAILY. 90 tablet 2   tamsulosin  (FLOMAX ) 0.4 MG CAPS capsule Take 0.4 mg by mouth daily. (Patient not taking: Reported on 11/24/2023)     No current facility-administered medications for this visit.    Allergies:   Patient has no known allergies.   Social History:  The patient  reports that he has never smoked. He has never been exposed to tobacco smoke. He has never used smokeless tobacco. He reports that he does not drink alcohol and does not use drugs.   Family History:  The patient's family history includes Arrhythmia in his father; Diabetes in his mother; Healthy in his daughter and son; Heart attack in his paternal grandfather; Heart disease in his father and paternal grandfather; Hyperlipidemia in his father and mother; Hypertension in his father and mother.  ROS:  Please see the history of present  illness.  All other systems are reviewed and otherwise negative.   PHYSICAL EXAM:  VS:  There were no vitals taken for this visit. BMI: There is no height or weight on file to calculate BMI. Well nourished, well developed, in no acute distress  HEENT: normocephalic, atraumatic  Neck: no JVD, carotid bruits or masses Cardiac: *** RRR; no significant murmurs, no rubs, or gallops Lungs:  *** CTA b/l, no wheezing, rhonchi or rales  Abd: soft, nontender MS: no deformity or arophy Ext: *** trace ankle edema  Skin: warm and dry, no rash Neuro:  No gross deficits appreciated Psych: euthymic mood, full affect  *** PPM site is stable, no tethering or discomfort   EKG:  done today and reviewed by myself ***  06/18/23: SR, V paced 63bpm, RBBB morphology ( ), give QRS duration, QTc or better  Device interrogation done today and reviewed by myself:   *** Battery and lead measurements are good ***  % effective BiVE pacing ***  01/26/21: LE arterial study Summary:  Right: Resting right ankle-brachial index is within normal range. No  evidence of significant right lower extremity arterial disease. The right  toe-brachial index is normal.   Left: Resting left ankle-brachial index is within normal range. No  evidence of significant left lower extremity arterial disease. The left  toe-brachial index is normal.   Echo 06/05/20  1. Inferior wall hypokinesis . Left ventricular ejection fraction, by  estimation, is 50 to 55%. The left ventricle has low normal function. The  left ventricle has no regional wall motion abnormalities. The left  ventricular internal cavity size was  mildly dilated. There is mild asymmetric left ventricular hypertrophy of  the basal and septal segments. Left ventricular diastolic parameters are  indeterminate.   2. Pacing wire in RA/RV . Right ventricular systolic function is normal.  The right ventricular size is normal.   3. Left atrial size was mildly  dilated.   4. Right atrial size was mildly dilated.   5. The mitral valve is abnormal. Mild mitral valve regurgitation. No  evidence of mitral stenosis.   6. The aortic valve is abnormal. Aortic valve regurgitation is mild. No  aortic stenosis is present.   7. The inferior vena cava is normal in size with greater than 50%  respiratory variability, suggesting right atrial pressure of 3 mmHg.   01/30/2018: LHC Nonobstructive coronary disease involving the RCA, proximal ramus intermedius, and LAD. Saccular aneurysm involving the proximal to mid segment of the ramus intermedius.  Aneurysm is approximately 5 mm in diameter. Mildly depressed LV function with global hypokinesis, dyssynergy, and EF estimated at 40%.  Normal LVEDP.   RECOMMENDATIONS:   Aggressive risk factor modification for nonobstructive CAD: LDL less than 70, blood pressure less than 130/80 mmHg, moderate aerobic activity, and monitoring for evidence of hypoglycemia. Saccular aneurysm and ramus intermedius requires management similar to that for coronary artery disease and in addition would recommend antiplatelet therapy to prevent distal embolization (favor monotherapy with Plavix over aspirin ).    01/31/17: TTE Study Conclusions - Left ventricle: The cavity size was normal. Systolic function was   normal. The estimated ejection fraction was in the range of 50%   to 55%. Wall motion was normal; there were no regional wall   motion abnormalities. Doppler parameters are consistent with   abnormal left ventricular relaxation (grade 1 diastolic   dysfunction). - Aortic valve: There was mild regurgitation. - Aorta: Aortic root dimension: 39 mm (ED). - Ascending aorta: The ascending aorta was mildly dilated. - Mitral valve: There was mild regurgitation. - Right ventricle: The cavity size was moderately dilated. Wall   thickness was normal. Pacer wire or catheter noted in right   ventricle. - Right atrium: The atrium was  mildly dilated. Impressions:  - EF mildly improved from prior study.  10/16/15: TTE Study Conclusions - Left ventricle: The cavity size was normal. Wall thickness was   normal. Systolic function was mildly reduced. The estimated   ejection fraction was in the range of 45% to 50%. Mild diffuse   hypokinesis with no identifiable regional variations. - Ventricular septum: Septal motion showed paradox. - Aortic valve: There was mild regurgitation. - Mitral valve: Systolic bowing without prolapse. There was mild   regurgitation. - Tricuspid valve: Mild prolapse. - Pulmonary arteries: PA peak pressure: 31 mm Hg (S). Impressions: -  Second degree AV block and Incessant PVCs are present throughout   the study and limit evaluation of systolic and diastolic   function.    Recent Labs: 06/18/2023: Magnesium  2.1 09/24/2023: ALT 19; BUN 14; Creatinine, Ser 0.95; Hemoglobin 13.9; Platelets 199; Potassium 4.7; Sodium 138; TSH 1.190  09/24/2023: Chol/HDL Ratio 3.4; Cholesterol, Total 164; HDL 48; LDL Chol Calc (NIH) 94; Triglycerides 123   CrCl cannot be calculated (Patient's most recent lab result is older than the maximum 21 days allowed.).   Wt Readings from Last 3 Encounters:  11/24/23 168 lb (76.2 kg)  10/01/23 163 lb 1.9 oz (74 kg)  09/17/23 166 lb (75.3 kg)     Other studies reviewed: Additional studies/records reviewed today include: summarized above  ASSESSMENT AND PLAN:  1. Heart block s/p PPM     *** intact function     *** no programming changes made  2. Mild NICM     *** % effective BP     Recovered LVEF by his last echo 2022     ***     *** On BB, ARB, lasix /aldactone    3. Known hx of PVC's, NSVT minimal      4. Paroxysmal Afib CHA2DS2Vasc is 3, on Eliquis , *** appropriately dosed Tikosyn  with *** stable QTc *** Meds look ok *** Labs today                      Disposition: we will see him in *** mo, sooner if needed.    Current medicines are reviewed at  length with the patient today.  The patient did not have any concerns regarding medicines.  Bonney Charlies Bard, PA-C 12/21/2023 8:53 AM     [facility] [address] [place] [address] [phone] (office)  [phone] (fax)

## 2023-12-24 ENCOUNTER — Ambulatory Visit: Attending: Cardiology | Admitting: Physician Assistant

## 2023-12-24 ENCOUNTER — Encounter: Payer: Self-pay | Admitting: Physician Assistant

## 2023-12-24 ENCOUNTER — Telehealth: Payer: Self-pay

## 2023-12-24 VITALS — BP 122/72 | HR 75 | Ht 68.0 in | Wt 162.6 lb

## 2023-12-24 DIAGNOSIS — I428 Other cardiomyopathies: Secondary | ICD-10-CM

## 2023-12-24 DIAGNOSIS — I4729 Other ventricular tachycardia: Secondary | ICD-10-CM

## 2023-12-24 DIAGNOSIS — Z950 Presence of cardiac pacemaker: Secondary | ICD-10-CM

## 2023-12-24 DIAGNOSIS — I10 Essential (primary) hypertension: Secondary | ICD-10-CM | POA: Diagnosis not present

## 2023-12-24 DIAGNOSIS — D6869 Other thrombophilia: Secondary | ICD-10-CM | POA: Diagnosis not present

## 2023-12-24 DIAGNOSIS — Z79899 Other long term (current) drug therapy: Secondary | ICD-10-CM | POA: Diagnosis not present

## 2023-12-24 DIAGNOSIS — I480 Paroxysmal atrial fibrillation: Secondary | ICD-10-CM | POA: Diagnosis not present

## 2023-12-24 DIAGNOSIS — Z5181 Encounter for therapeutic drug level monitoring: Secondary | ICD-10-CM | POA: Diagnosis not present

## 2023-12-24 LAB — CUP PACEART INCLINIC DEVICE CHECK
Battery Remaining Longevity: 6 mo
Battery Voltage: 2.66 V
Brady Statistic AP VP Percent: 23.77 %
Brady Statistic AP VS Percent: 0.09 %
Brady Statistic AS VP Percent: 75 %
Brady Statistic AS VS Percent: 1.14 %
Brady Statistic RA Percent Paced: 24.47 %
Brady Statistic RV Percent Paced: 98.77 %
Date Time Interrogation Session: 20251008090750
Implantable Lead Connection Status: 753985
Implantable Lead Connection Status: 753985
Implantable Lead Connection Status: 753985
Implantable Lead Implant Date: 20170802
Implantable Lead Implant Date: 20170802
Implantable Lead Implant Date: 20170802
Implantable Lead Location: 753858
Implantable Lead Location: 753859
Implantable Lead Location: 753860
Implantable Lead Model: 5076
Implantable Lead Model: 5076
Implantable Pulse Generator Implant Date: 20170802
Lead Channel Impedance Value: 1007 Ohm
Lead Channel Impedance Value: 380 Ohm
Lead Channel Impedance Value: 418 Ohm
Lead Channel Impedance Value: 475 Ohm
Lead Channel Impedance Value: 513 Ohm
Lead Channel Impedance Value: 513 Ohm
Lead Channel Impedance Value: 532 Ohm
Lead Channel Impedance Value: 551 Ohm
Lead Channel Impedance Value: 627 Ohm
Lead Channel Impedance Value: 741 Ohm
Lead Channel Impedance Value: 798 Ohm
Lead Channel Impedance Value: 893 Ohm
Lead Channel Impedance Value: 912 Ohm
Lead Channel Impedance Value: 988 Ohm
Lead Channel Pacing Threshold Amplitude: 0.625 V
Lead Channel Pacing Threshold Amplitude: 1.25 V
Lead Channel Pacing Threshold Amplitude: 1.625 V
Lead Channel Pacing Threshold Pulse Width: 0.4 ms
Lead Channel Pacing Threshold Pulse Width: 0.4 ms
Lead Channel Pacing Threshold Pulse Width: 0.8 ms
Lead Channel Sensing Intrinsic Amplitude: 18.875 mV
Lead Channel Sensing Intrinsic Amplitude: 18.875 mV
Lead Channel Sensing Intrinsic Amplitude: 2 mV
Lead Channel Sensing Intrinsic Amplitude: 2.75 mV
Lead Channel Setting Pacing Amplitude: 1.5 V
Lead Channel Setting Pacing Amplitude: 2.25 V
Lead Channel Setting Pacing Amplitude: 2.5 V
Lead Channel Setting Pacing Pulse Width: 0.4 ms
Lead Channel Setting Pacing Pulse Width: 0.8 ms
Lead Channel Setting Sensing Sensitivity: 2.8 mV
Zone Setting Status: 755011
Zone Setting Status: 755011

## 2023-12-24 NOTE — Patient Instructions (Signed)
 Medication Instructions:    Your physician recommends that you continue on your current medications as directed. Please refer to the Current Medication list given to you today.   *If you need a refill on your cardiac medications before your next appointment, please call your pharmacy*   Lab Work:  PLEASE GO DOWN STAIRS  [facility]  FIRST FLOOR   ( GET OFF ELEVATORS WALK TOWARDS WAITING AREA LAB LOCATED BY PHARMACY):  BMET AND MAG TODAY      If you have labs (blood work) drawn today and your tests are completely normal, you will receive your results only by: MyChart Message (if you have MyChart) OR A paper copy in the mail If you have any lab test that is abnormal or we need to change your treatment, we will call you to review the results.   Testing/Procedures: Your physician has requested that you have an echocardiogram. Echocardiography is a painless test that uses sound waves to create images of your heart. It provides your doctor with information about the size and shape of your heart and how well your heart's chambers and valves are working. This procedure takes approximately one hour. There are no restrictions for this procedure. Please do NOT wear cologne, perfume, aftershave, or lotions (deodorant is allowed). Please arrive 15 minutes prior to your appointment time.  Please note: We ask at that you not bring children with you during ultrasound (echo/ vascular) testing. Due to room size and safety concerns, children are not allowed in the ultrasound rooms during exams. Our front office staff cannot provide observation of children in our lobby area while testing is being conducted. An adult accompanying a patient to their appointment will only be allowed in the ultrasound room at the discretion of the ultrasound technician under special circumstances. We apologize for any inconvenience.    Follow-Up: At [facility], you and your health needs are our priority.  As part of our  continuing mission to provide you with exceptional heart care, our providers are all part of one team.  This team includes your primary Cardiologist (physician) and Advanced Practice Providers or APPs (Physician Assistants and Nurse Practitioners) who all work together to provide you with the care you need, when you need it.   Your next appointment:  6 month(s)  ( NEW PATIENT)   Provider:   Soyla Norton, MD     We recommend signing up for the patient portal called MyChart.  Sign up information is provided on this After Visit Summary.  MyChart is used to connect with patients for Virtual Visits (Telemedicine).  Patients are able to view lab/test results, encounter notes, upcoming appointments, etc.  Non-urgent messages can be sent to your provider as well.   To learn more about what you can do with MyChart, go to [email or web address].   Other Instructions

## 2023-12-24 NOTE — Telephone Encounter (Signed)
 Sent msg to [facility] to please set New Patient appt for this patient w [facility]  12-24-23 VB

## 2023-12-25 ENCOUNTER — Ambulatory Visit: Payer: Self-pay | Admitting: Physician Assistant

## 2023-12-25 LAB — BASIC METABOLIC PANEL WITH GFR
BUN/Creatinine Ratio: 14 (ref 10–24)
BUN: 14 mg/dL (ref 8–27)
CO2: 23 mmol/L (ref 20–29)
Calcium: 9.7 mg/dL (ref 8.6–10.2)
Chloride: 99 mmol/L (ref 96–106)
Creatinine, Ser: 0.99 mg/dL (ref 0.76–1.27)
Glucose: 107 mg/dL — ABNORMAL HIGH (ref 70–99)
Potassium: 4.5 mmol/L (ref 3.5–5.2)
Sodium: 138 mmol/L (ref 134–144)
eGFR: 82 mL/min/1.73 (ref >59)

## 2023-12-25 LAB — MAGNESIUM: Magnesium: 2.2 mg/dL (ref 1.6–2.3)

## 2024-01-03 ENCOUNTER — Other Ambulatory Visit: Payer: Self-pay | Admitting: Physician Assistant

## 2024-01-07 ENCOUNTER — Ambulatory Visit: Payer: Self-pay | Admitting: Cardiology

## 2024-01-12 ENCOUNTER — Telehealth: Payer: Self-pay | Admitting: Physician Assistant

## 2024-01-12 ENCOUNTER — Other Ambulatory Visit: Payer: Self-pay | Admitting: Family Medicine

## 2024-01-12 MED ORDER — CARVEDILOL 25 MG PO TABS: 25.0000 mg | ORAL_TABLET | Freq: Two times a day (BID) | ORAL | 2 refills | Status: AC

## 2024-01-12 NOTE — Telephone Encounter (Signed)
*  STAT* If patient is at the pharmacy, call can be transferred to refill team.   1. Which medications need to be refilled? (please list name of each medication and dose if known) carvedilol  (COREG ) 25 MG tablet    2. Would you like to learn more about the convenience, safety, & potential cost savings by using the [facility]?    3. Are you open to using the [facility] (Type [facility].  ).   4. Which pharmacy/location (including street and city if local pharmacy) is medication to be sent to?  [id number] - [place], [place] - [address] [place] [facility]     5. Do they need a 30 day or 90 day supply? 90 day

## 2024-01-12 NOTE — Telephone Encounter (Signed)
 Sent in refill

## 2024-01-21 ENCOUNTER — Ambulatory Visit

## 2024-01-21 DIAGNOSIS — I428 Other cardiomyopathies: Secondary | ICD-10-CM

## 2024-01-22 LAB — CUP PACEART REMOTE DEVICE CHECK
Battery Remaining Longevity: 6 mo
Battery Voltage: 2.64 V
Brady Statistic AP VP Percent: 25.43 %
Brady Statistic AP VS Percent: 0.14 %
Brady Statistic AS VP Percent: 73.38 %
Brady Statistic AS VS Percent: 1.06 %
Brady Statistic RA Percent Paced: 26.1 %
Brady Statistic RV Percent Paced: 98.8 %
Date Time Interrogation Session: 20251104210727
Implantable Lead Connection Status: 753985
Implantable Lead Connection Status: 753985
Implantable Lead Connection Status: 753985
Implantable Lead Implant Date: 20170802
Implantable Lead Implant Date: 20170802
Implantable Lead Implant Date: 20170802
Implantable Lead Location: 753858
Implantable Lead Location: 753859
Implantable Lead Location: 753860
Implantable Lead Model: 5076
Implantable Lead Model: 5076
Implantable Pulse Generator Implant Date: 20170802
Lead Channel Impedance Value: 1026 Ohm
Lead Channel Impedance Value: 1045 Ohm
Lead Channel Impedance Value: 361 Ohm
Lead Channel Impedance Value: 399 Ohm
Lead Channel Impedance Value: 437 Ohm
Lead Channel Impedance Value: 437 Ohm
Lead Channel Impedance Value: 456 Ohm
Lead Channel Impedance Value: 513 Ohm
Lead Channel Impedance Value: 532 Ohm
Lead Channel Impedance Value: 646 Ohm
Lead Channel Impedance Value: 798 Ohm
Lead Channel Impedance Value: 817 Ohm
Lead Channel Impedance Value: 931 Ohm
Lead Channel Impedance Value: 931 Ohm
Lead Channel Pacing Threshold Amplitude: 0.5 V
Lead Channel Pacing Threshold Amplitude: 1.125 V
Lead Channel Pacing Threshold Amplitude: 1.75 V
Lead Channel Pacing Threshold Pulse Width: 0.4 ms
Lead Channel Pacing Threshold Pulse Width: 0.4 ms
Lead Channel Pacing Threshold Pulse Width: 0.8 ms
Lead Channel Sensing Intrinsic Amplitude: 18.875 mV
Lead Channel Sensing Intrinsic Amplitude: 18.875 mV
Lead Channel Sensing Intrinsic Amplitude: 2.5 mV
Lead Channel Sensing Intrinsic Amplitude: 2.5 mV
Lead Channel Setting Pacing Amplitude: 1.5 V
Lead Channel Setting Pacing Amplitude: 2.25 V
Lead Channel Setting Pacing Amplitude: 2.5 V
Lead Channel Setting Pacing Pulse Width: 0.4 ms
Lead Channel Setting Pacing Pulse Width: 0.8 ms
Lead Channel Setting Sensing Sensitivity: 2.8 mV
Zone Setting Status: 755011
Zone Setting Status: 755011

## 2024-01-23 NOTE — Progress Notes (Signed)
 Remote PPM Transmission

## 2024-01-27 ENCOUNTER — Ambulatory Visit: Payer: Self-pay | Admitting: Cardiology

## 2024-02-09 ENCOUNTER — Other Ambulatory Visit: Payer: Self-pay

## 2024-02-10 ENCOUNTER — Ambulatory Visit

## 2024-02-10 MED ORDER — DOFETILIDE 500 MCG PO CAPS: 500.0000 ug | ORAL_CAPSULE | Freq: Two times a day (BID) | ORAL | 3 refills | Status: AC

## 2024-02-11 ENCOUNTER — Ambulatory Visit (HOSPITAL_COMMUNITY)
Admission: RE | Admit: 2024-02-11 | Discharge: 2024-02-11 | Disposition: A | Discharge status: Home / Self Care | Source: Ambulatory Visit | Attending: Cardiology | Admitting: Cardiology

## 2024-02-11 DIAGNOSIS — I428 Other cardiomyopathies: Secondary | ICD-10-CM | POA: Diagnosis not present

## 2024-02-11 LAB — ECHOCARDIOGRAM COMPLETE
Area-P 1/2: 2.48 cm2
P 1/2 time: 573 ms
S' Lateral: 2.6 cm

## 2024-02-17 ENCOUNTER — Telehealth: Payer: Self-pay

## 2024-02-17 ENCOUNTER — Telehealth: Payer: Self-pay | Admitting: Physician Assistant

## 2024-02-17 DIAGNOSIS — I4819 Other persistent atrial fibrillation: Secondary | ICD-10-CM

## 2024-02-17 MED ORDER — APIXABAN 5 MG PO TABS: 5.0000 mg | ORAL_TABLET | Freq: Two times a day (BID) | ORAL | 3 refills | Status: DC

## 2024-02-17 NOTE — Telephone Encounter (Signed)
 *  STAT* If patient is at the pharmacy, call can be transferred to refill team.   1. Which medications need to be refilled? (please list name of each medication and dose if known)   ELIQUIS  5 MG TABS tablet   2. Which pharmacy/location (including street and city if local pharmacy) is medication to be sent to?  [id number] - [place], [place] - [address] [facility]    3. Do they need a 30 day or 90 day supply? 90  Patient is completely out of medication

## 2024-02-17 NOTE — Telephone Encounter (Signed)
 Eliquis  refilled, [facility] to patient.

## 2024-02-20 MED ORDER — APIXABAN 5 MG PO TABS: 5.0000 mg | ORAL_TABLET | Freq: Two times a day (BID) | ORAL | 3 refills | Status: AC

## 2024-02-20 NOTE — Telephone Encounter (Signed)
 Prescription refill request for Eliquis  received. Indication: a fib Last office visit:12/24/23 Scr: 0.99 12/24/23 epic Age: 73 Weight: 73kg

## 2024-02-23 ENCOUNTER — Ambulatory Visit: Attending: Cardiology

## 2024-02-23 ENCOUNTER — Ambulatory Visit

## 2024-02-24 LAB — CUP PACEART REMOTE DEVICE CHECK
Battery Remaining Longevity: 5 mo
Battery Voltage: 2.63 V
Brady Statistic AP VP Percent: 27.83 %
Brady Statistic AP VS Percent: 0.04 %
Brady Statistic AS VP Percent: 71.26 %
Brady Statistic AS VS Percent: 0.87 %
Brady Statistic RA Percent Paced: 28.43 %
Brady Statistic RV Percent Paced: 99.08 %
Date Time Interrogation Session: 20251207230700
Implantable Lead Connection Status: 753985
Implantable Lead Connection Status: 753985
Implantable Lead Connection Status: 753985
Implantable Lead Implant Date: 20170802
Implantable Lead Implant Date: 20170802
Implantable Lead Implant Date: 20170802
Implantable Lead Location: 753858
Implantable Lead Location: 753859
Implantable Lead Location: 753860
Implantable Lead Model: 5076
Implantable Lead Model: 5076
Implantable Pulse Generator Implant Date: 20170802
Lead Channel Impedance Value: 1045 Ohm
Lead Channel Impedance Value: 323 Ohm
Lead Channel Impedance Value: 399 Ohm
Lead Channel Impedance Value: 418 Ohm
Lead Channel Impedance Value: 456 Ohm
Lead Channel Impedance Value: 456 Ohm
Lead Channel Impedance Value: 513 Ohm
Lead Channel Impedance Value: 551 Ohm
Lead Channel Impedance Value: 627 Ohm
Lead Channel Impedance Value: 760 Ohm
Lead Channel Impedance Value: 836 Ohm
Lead Channel Impedance Value: 912 Ohm
Lead Channel Impedance Value: 950 Ohm
Lead Channel Impedance Value: 969 Ohm
Lead Channel Pacing Threshold Amplitude: 0.625 V
Lead Channel Pacing Threshold Amplitude: 1.625 V
Lead Channel Pacing Threshold Amplitude: 1.625 V
Lead Channel Pacing Threshold Pulse Width: 0.4 ms
Lead Channel Pacing Threshold Pulse Width: 0.4 ms
Lead Channel Pacing Threshold Pulse Width: 0.8 ms
Lead Channel Sensing Intrinsic Amplitude: 14.25 mV
Lead Channel Sensing Intrinsic Amplitude: 14.25 mV
Lead Channel Sensing Intrinsic Amplitude: 2 mV
Lead Channel Sensing Intrinsic Amplitude: 2 mV
Lead Channel Setting Pacing Amplitude: 1.5 V
Lead Channel Setting Pacing Amplitude: 2.75 V
Lead Channel Setting Pacing Amplitude: 3.25 V
Lead Channel Setting Pacing Pulse Width: 0.4 ms
Lead Channel Setting Pacing Pulse Width: 0.8 ms
Lead Channel Setting Sensing Sensitivity: 2.8 mV
Zone Setting Status: 755011
Zone Setting Status: 755011

## 2024-03-07 ENCOUNTER — Other Ambulatory Visit: Payer: Self-pay | Admitting: Physician Assistant

## 2024-03-08 ENCOUNTER — Other Ambulatory Visit: Payer: Self-pay

## 2024-03-08 MED ORDER — SPIRONOLACTONE 25 MG PO TABS: 25.0000 mg | ORAL_TABLET | Freq: Every day | ORAL | 2 refills | Status: AC

## 2024-03-19 ENCOUNTER — Other Ambulatory Visit: Payer: Self-pay

## 2024-03-19 DIAGNOSIS — E785 Hyperlipidemia, unspecified: Secondary | ICD-10-CM

## 2024-03-19 DIAGNOSIS — E538 Deficiency of other specified B group vitamins: Secondary | ICD-10-CM

## 2024-03-19 DIAGNOSIS — I10 Essential (primary) hypertension: Secondary | ICD-10-CM

## 2024-03-19 DIAGNOSIS — E0849 Diabetes mellitus due to underlying condition with other diabetic neurological complication: Secondary | ICD-10-CM

## 2024-03-19 DIAGNOSIS — Z131 Encounter for screening for diabetes mellitus: Secondary | ICD-10-CM

## 2024-03-19 DIAGNOSIS — E559 Vitamin D deficiency, unspecified: Secondary | ICD-10-CM

## 2024-03-25 ENCOUNTER — Ambulatory Visit

## 2024-03-25 ENCOUNTER — Ambulatory Visit: Attending: Cardiology

## 2024-03-25 LAB — CUP PACEART REMOTE DEVICE CHECK
Battery Remaining Longevity: 4 mo
Battery Voltage: 2.62 V
Brady Statistic AP VP Percent: 24.77 %
Brady Statistic AP VS Percent: 0.1 %
Brady Statistic AS VP Percent: 74.04 %
Brady Statistic AS VS Percent: 1.09 %
Brady Statistic RA Percent Paced: 25.52 %
Brady Statistic RV Percent Paced: 98.81 %
Date Time Interrogation Session: 20260107203011
Implantable Lead Connection Status: 753985
Implantable Lead Connection Status: 753985
Implantable Lead Connection Status: 753985
Implantable Lead Implant Date: 20170802
Implantable Lead Implant Date: 20170802
Implantable Lead Implant Date: 20170802
Implantable Lead Location: 753858
Implantable Lead Location: 753859
Implantable Lead Location: 753860
Implantable Lead Model: 5076
Implantable Lead Model: 5076
Implantable Pulse Generator Implant Date: 20170802
Lead Channel Impedance Value: 1064 Ohm
Lead Channel Impedance Value: 323 Ohm
Lead Channel Impedance Value: 380 Ohm
Lead Channel Impedance Value: 418 Ohm
Lead Channel Impedance Value: 437 Ohm
Lead Channel Impedance Value: 456 Ohm
Lead Channel Impedance Value: 513 Ohm
Lead Channel Impedance Value: 570 Ohm
Lead Channel Impedance Value: 627 Ohm
Lead Channel Impedance Value: 741 Ohm
Lead Channel Impedance Value: 836 Ohm
Lead Channel Impedance Value: 912 Ohm
Lead Channel Impedance Value: 950 Ohm
Lead Channel Impedance Value: 969 Ohm
Lead Channel Pacing Threshold Amplitude: 0.625 V
Lead Channel Pacing Threshold Amplitude: 1.5 V
Lead Channel Pacing Threshold Amplitude: 1.5 V
Lead Channel Pacing Threshold Pulse Width: 0.4 ms
Lead Channel Pacing Threshold Pulse Width: 0.4 ms
Lead Channel Pacing Threshold Pulse Width: 0.8 ms
Lead Channel Sensing Intrinsic Amplitude: 1.75 mV
Lead Channel Sensing Intrinsic Amplitude: 1.75 mV
Lead Channel Sensing Intrinsic Amplitude: 14.25 mV
Lead Channel Sensing Intrinsic Amplitude: 14.25 mV
Lead Channel Setting Pacing Amplitude: 1.5 V
Lead Channel Setting Pacing Amplitude: 2 V
Lead Channel Setting Pacing Amplitude: 3 V
Lead Channel Setting Pacing Pulse Width: 0.4 ms
Lead Channel Setting Pacing Pulse Width: 0.8 ms
Lead Channel Setting Sensing Sensitivity: 2.8 mV
Zone Setting Status: 755011
Zone Setting Status: 755011

## 2024-03-26 ENCOUNTER — Other Ambulatory Visit

## 2024-03-26 DIAGNOSIS — Z131 Encounter for screening for diabetes mellitus: Secondary | ICD-10-CM

## 2024-03-26 DIAGNOSIS — E785 Hyperlipidemia, unspecified: Secondary | ICD-10-CM

## 2024-03-26 DIAGNOSIS — E559 Vitamin D deficiency, unspecified: Secondary | ICD-10-CM

## 2024-03-26 DIAGNOSIS — E538 Deficiency of other specified B group vitamins: Secondary | ICD-10-CM

## 2024-03-26 DIAGNOSIS — E0849 Diabetes mellitus due to underlying condition with other diabetic neurological complication: Secondary | ICD-10-CM

## 2024-03-27 LAB — LIPID PANEL

## 2024-03-29 ENCOUNTER — Ambulatory Visit: Payer: Self-pay

## 2024-04-01 ENCOUNTER — Ambulatory Visit

## 2024-04-01 VITALS — BP 108/68 | HR 70 | Temp 98.0°F | Ht 68.0 in | Wt 165.1 lb

## 2024-04-01 DIAGNOSIS — Z95811 Presence of heart assist device: Secondary | ICD-10-CM | POA: Diagnosis not present

## 2024-04-01 DIAGNOSIS — I10 Essential (primary) hypertension: Secondary | ICD-10-CM | POA: Diagnosis not present

## 2024-04-01 DIAGNOSIS — G6289 Other specified polyneuropathies: Secondary | ICD-10-CM | POA: Diagnosis not present

## 2024-04-01 DIAGNOSIS — I472 Ventricular tachycardia, unspecified: Secondary | ICD-10-CM

## 2024-04-01 DIAGNOSIS — Z125 Encounter for screening for malignant neoplasm of prostate: Secondary | ICD-10-CM | POA: Diagnosis not present

## 2024-04-01 DIAGNOSIS — I4819 Other persistent atrial fibrillation: Secondary | ICD-10-CM | POA: Diagnosis not present

## 2024-04-01 DIAGNOSIS — E785 Hyperlipidemia, unspecified: Secondary | ICD-10-CM | POA: Diagnosis not present

## 2024-04-01 DIAGNOSIS — I442 Atrioventricular block, complete: Secondary | ICD-10-CM | POA: Diagnosis not present

## 2024-04-01 DIAGNOSIS — I480 Paroxysmal atrial fibrillation: Secondary | ICD-10-CM

## 2024-04-01 NOTE — Patient Instructions (Signed)
 VISIT SUMMARY: George [name], a 74 year old male with diabetes, came in for a routine check-up and lab review. Key issues discussed included diabetes management, heart disease, anemia, peripheral neuropathy, and a ganglion cyst on his ankle.  YOUR PLAN: DIABETES MELLITUS: Your blood glucose and hemoglobin A1c levels are well-controlled. You are making dietary changes to manage your blood sugar. -Continue monitoring your blood sugar levels. -Provide a urine sample for diabetic kidney exam at a later time. -Continue reducing cookie intake to manage blood sugar.  HEART DISEASE: Your heart assist device is functioning well, and your recent echocardiogram showed improved heart function. -Continue taking Eliquis , atorvastatin , carvedilol , Tikosyn , and Lasix .  ANEMIA: You have mild anemia with slightly low hemoglobin levels but no symptoms of fatigue or cold intolerance. -Monitor your hemoglobin levels. -Consider iron supplementation if your hemoglobin drops further.  BENIGN PROSTATIC HYPERPLASIA: You have lower urinary tract symptoms managed with Flomax  and spironolactone . -Continue taking Flomax  and spironolactone .  PERIPHERAL NEUROPATHY: Your peripheral neuropathy is managed with gabapentin , which is effective at the current dose. -Continue taking gabapentin  at the current dose. -Increase the gabapentin  dose if needed, up to three times daily.  GENERAL HEALTH MAINTENANCE: Routine health maintenance was discussed, including vaccinations and screenings. Rita PSA for prostate cancer screening. -Continue taking your multivitamin with vitamin D . -Schedule an eye exam in May. -Consider getting a pneumonia vaccination if desired.  If you have any problems before your next visit feel free to message me via MyChart (minor issues or questions) or call the office, otherwise you may reach out to schedule an office visit.  Thank you! Saddie Sacks, PA-C

## 2024-04-02 ENCOUNTER — Ambulatory Visit

## 2024-04-02 ENCOUNTER — Other Ambulatory Visit: Payer: Self-pay

## 2024-04-02 ENCOUNTER — Ambulatory Visit: Payer: Self-pay

## 2024-04-02 DIAGNOSIS — E0849 Diabetes mellitus due to underlying condition with other diabetic neurological complication: Secondary | ICD-10-CM

## 2024-04-02 LAB — POCT UA - MICROALBUMIN
Albumin/Creatinine Ratio, Urine, POC: <30
Creatinine, POC: 50 mg/dL
Microalbumin Ur, POC: 10 mg/L

## 2024-04-02 LAB — PSA: Prostate Specific Ag, Serum: 2 ng/mL (ref 0.0–4.0)

## 2024-04-04 NOTE — Assessment & Plan Note (Signed)
 Pacemaker in place. Rate controlled with carvediolol. Anticoagulated on Eliquis . Rhythm controlled on Tikosyn . Cont follow up with cardiology.

## 2024-04-04 NOTE — Assessment & Plan Note (Signed)
 Blood pressure goal <130/80. Blood pressure  at goal today in office. Continue carvedilol  25 mg twice daily, Lasix  20 mg daily, losartan  12.5 mg daily, spironolactone  25 mg daily for now

## 2024-04-04 NOTE — Assessment & Plan Note (Signed)
 Followed by cardiology.  Encouraged him to continue regular follow-up.  Continue carvedilol  and Eliquis  as prescribed.

## 2024-04-04 NOTE — Progress Notes (Signed)
 "  Established Patient Office Visit  Subjective   Patient ID: George [name], male    DOB: 10/19/1950  Age: 74 y.o. MRN: [id number]  Chief Complaint  Patient presents with   Medical Management of Chronic Issues    HPI  Discussed the use of AI scribe software for clinical note transcription with the patient, who gave verbal consent to proceed.  History of Present Illness   George [name] is a 74 year old male with diabetes who presents for a routine check-up and lab review.  Prediabetes - Blood glucose level 99 mg/dL - Hemoglobin J8r 4.2% - Dietary modifications include reducing cookie intake to manage blood sugar  Hematologic findings - Recent laboratory results show slightly elevated B12 level; currently taking B12 supplementation daily - No symptoms  Vitamin d  supplementation and musculoskeletal symptoms - Takes vitamin D3 daily, resumed after experiencing joint pain - Uncertain of exact dosage, likely 1000 IU  Cardiac history and management - History of heart disease - Recent echocardiogram performed on March 20, 2024 - Has a pacemaker - Current medications include Eliquis , atorvastatin , carvedilol , Tikosyn , Cymbalta , Lasix , gabapentin , losartan , Flomax , and spironolactone   Peripheral edema - Takes Lasix  daily for occasional swelling in feet and ankles        ROS Per HPI.    Objective:     BP 108/68   Pulse 70   Temp 98 F (36.7 C) (Oral)   Ht 5' 8 (1.727 m)   Wt 165 lb 1.3 oz (74.9 kg)   SpO2 99%   BMI 25.10 kg/m    Physical Exam Constitutional:      General: He is not in acute distress.    Appearance: Normal appearance.  Cardiovascular:     Rate and Rhythm: Normal rate and regular rhythm.     Heart sounds: Normal heart sounds. No murmur heard.    No friction rub. No gallop.  Pulmonary:     Effort: Pulmonary effort is normal. No respiratory distress.     Breath sounds: Normal breath sounds.  Musculoskeletal:        General: No swelling.   Skin:    General: Skin is warm and dry.  Neurological:     General: No focal deficit present.     Mental Status: He is alert.  Psychiatric:        Mood and Affect: Mood normal.        Behavior: Behavior normal.        Thought Content: Thought content normal.      Results for orders placed or performed in visit on 04/01/24  PSA  Result Value Ref Range   Prostate Specific Ag, Serum 2.0 0.0 - 4.0 ng/mL  POCT UA - Microalbumin  Result Value Ref Range   Microalbumin Ur, POC 10 mg/L   Creatinine, POC 50 mg/dL   Albumin/Creatinine Ratio, Urine, POC <30     Last CBC Lab Results  Component Value Date   WBC 6.6 03/26/2024   HGB 12.5 (L) 03/26/2024   HCT 38.7 03/26/2024   MCV 94 03/26/2024   MCH 30.4 03/26/2024   RDW 12.3 03/26/2024   PLT 239 03/26/2024   Last metabolic panel Lab Results  Component Value Date   GLUCOSE 99 03/26/2024   NA 136 03/26/2024   K 4.9 03/26/2024   CL 100 03/26/2024   CO2 28 03/26/2024   BUN 16 03/26/2024   CREATININE 0.94 03/26/2024   EGFR 87 03/26/2024   CALCIUM  9.5 03/26/2024   PROT  6.5 03/26/2024   ALBUMIN 4.0 03/26/2024   LABGLOB 2.5 03/26/2024   AGRATIO 1.7 07/18/2022   BILITOT 0.7 03/26/2024   ALKPHOS 106 03/26/2024   AST 18 03/26/2024   ALT 14 03/26/2024   ANIONGAP 6 12/22/2020   Last lipids Lab Results  Component Value Date   CHOL 154 03/26/2024   HDL 41 03/26/2024   LDLCALC 96 03/26/2024   TRIG 88 03/26/2024   CHOLHDL 3.8 03/26/2024   Last hemoglobin A1c Lab Results  Component Value Date   HGBA1C 5.7 (H) 03/26/2024   Last thyroid  functions Lab Results  Component Value Date   TSH 1.310 03/26/2024   FREET4 1.20 09/24/2018   Last vitamin D  Lab Results  Component Value Date   VD25OH 31.1 03/26/2024      The 10-year ASCVD risk score (Arnett DK, et al., 2019) is: 29.1%    Assessment & Plan:   Screening for prostate cancer -     PSA; Future  Presence of heart assist device [facility]) Assessment & Plan: Follows  with cardiology.  Anticoagulated on Eliquis  5 mg BID   Paroxysmal atrial fibrillation (HCC)  Essential hypertension Assessment & Plan: Blood pressure goal <130/80. Blood pressure  at goal today in office. Continue carvedilol  25 mg twice daily, Lasix  20 mg daily, losartan  12.5 mg daily, spironolactone  25 mg daily for now   Orders: -     POCT UA - Microalbumin  Hyperlipidemia LDL goal <100 Assessment & Plan: Last lipid panel: LDL 96, HDL 41, Trig 123. Stable. Continue atorvastatin  20 mg daily. Last CMP within normal limits.    Complete heart block [facility]) Assessment & Plan: Pacemaker in place. Continue follow up with cardiology. Continue with current cardiac medications to include Eliquis , Tikosyn , and carvedilol .    Other polyneuropathy Assessment & Plan: Managed with gabapentin , effective at current dose - Continue gabapentin  100 mg nightly and duloxetine  60 mg daily - Increase gabapentin  dose if needed, up to  300 mg at bedtime if needed.   VT (ventricular tachycardia) (HCC) Assessment & Plan: Pacemaker in place. Rate controlled with carvediolol. Anticoagulated on Eliquis . Rhythm controlled on Tikosyn . Cont follow up with cardiology.   Persistent atrial fibrillation [facility]) Assessment & Plan: Followed by cardiology. Encouraged him to continue regular follow-up. Continue carvedilol  and Eliquis  as prescribed.      Return in about 4 months (around 07/30/2024) for HTN, HLD.    Saddie JULIANNA Sacks, PA-C "

## 2024-04-04 NOTE — Assessment & Plan Note (Addendum)
 Managed with gabapentin , effective at current dose - Continue gabapentin  100 mg nightly and duloxetine  60 mg daily - Increase gabapentin  dose if needed, up to  300 mg at bedtime if needed.

## 2024-04-04 NOTE — Assessment & Plan Note (Signed)
 Follows with cardiology.  Anticoagulated on Eliquis  5 mg BID

## 2024-04-04 NOTE — Assessment & Plan Note (Signed)
 Pacemaker in place. Continue follow up with cardiology. Continue with current cardiac medications to include Eliquis , Tikosyn , and carvedilol .

## 2024-04-04 NOTE — Assessment & Plan Note (Signed)
 Last lipid panel: LDL 96, HDL 41, Trig 123. Stable. Continue atorvastatin  20 mg daily. Last CMP within normal limits.

## 2024-04-05 LAB — CBC WITH DIFFERENTIAL/PLATELET
Basophils Absolute: 0 x10E3/uL (ref 0.0–0.2)
Basos: 1 %
EOS (ABSOLUTE): 0.2 x10E3/uL (ref 0.0–0.4)
Eos: 3 %
Hematocrit: 38.7 % (ref 37.5–51.0)
Hemoglobin: 12.5 g/dL — ABNORMAL LOW (ref 13.0–17.7)
Immature Grans (Abs): 0 x10E3/uL (ref 0.0–0.1)
Immature Granulocytes: 0 %
Lymphocytes Absolute: 2.1 x10E3/uL (ref 0.7–3.1)
Lymphs: 32 %
MCH: 30.4 pg (ref 26.6–33.0)
MCHC: 32.3 g/dL (ref 31.5–35.7)
MCV: 94 fL (ref 79–97)
Monocytes Absolute: 0.5 x10E3/uL (ref 0.1–0.9)
Monocytes: 7 %
Neutrophils Absolute: 3.8 x10E3/uL (ref 1.4–7.0)
Neutrophils: 56 %
Platelets: 239 x10E3/uL (ref 150–450)
RBC: 4.11 x10E6/uL — ABNORMAL LOW (ref 4.14–5.80)
RDW: 12.3 % (ref 11.6–15.4)
WBC: 6.6 x10E3/uL (ref 3.4–10.8)

## 2024-04-05 LAB — COMPREHENSIVE METABOLIC PANEL WITH GFR
ALT: 14 IU/L (ref 0–44)
AST: 18 IU/L (ref 0–40)
Albumin: 4 g/dL (ref 3.8–4.8)
Alkaline Phosphatase: 106 IU/L (ref 47–123)
BUN/Creatinine Ratio: 17 (ref 10–24)
BUN: 16 mg/dL (ref 8–27)
Bilirubin Total: 0.7 mg/dL (ref 0.0–1.2)
CO2: 28 mmol/L (ref 20–29)
Calcium: 9.5 mg/dL (ref 8.6–10.2)
Chloride: 100 mmol/L (ref 96–106)
Creatinine, Ser: 0.94 mg/dL (ref 0.76–1.27)
Globulin, Total: 2.5 g/dL (ref 1.5–4.5)
Glucose: 99 mg/dL (ref 70–99)
Potassium: 4.9 mmol/L (ref 3.5–5.2)
Sodium: 136 mmol/L (ref 134–144)
Total Protein: 6.5 g/dL (ref 6.0–8.5)
eGFR: 87 mL/min/1.73

## 2024-04-05 LAB — SPECIMEN STATUS REPORT

## 2024-04-05 LAB — MICROALBUMIN / CREATININE URINE RATIO

## 2024-04-05 LAB — TSH: TSH: 1.31 u[IU]/mL (ref 0.450–4.500)

## 2024-04-05 LAB — HEMOGLOBIN A1C
Est. average glucose Bld gHb Est-mCnc: 117 mg/dL
Hgb A1c MFr Bld: 5.7 % — ABNORMAL HIGH (ref 4.8–5.6)

## 2024-04-05 LAB — LIPID PANEL
Chol/HDL Ratio: 3.8 ratio (ref 0.0–5.0)
Cholesterol, Total: 154 mg/dL (ref 100–199)
HDL: 41 mg/dL
LDL Chol Calc (NIH): 96 mg/dL (ref 0–99)
Triglycerides: 88 mg/dL (ref 0–149)
VLDL Cholesterol Cal: 17 mg/dL (ref 5–40)

## 2024-04-05 LAB — VITAMIN B12: Vitamin B-12: 1466 pg/mL — ABNORMAL HIGH (ref 232–1245)

## 2024-04-05 LAB — VITAMIN D 25 HYDROXY (VIT D DEFICIENCY, FRACTURES): Vit D, 25-Hydroxy: 31.1 ng/mL (ref 30.0–100.0)

## 2024-04-21 ENCOUNTER — Encounter

## 2024-04-25 ENCOUNTER — Ambulatory Visit

## 2024-05-20 ENCOUNTER — Ambulatory Visit

## 2024-05-26 ENCOUNTER — Ambulatory Visit

## 2024-06-10 ENCOUNTER — Encounter: Admitting: Cardiology

## 2024-06-26 ENCOUNTER — Ambulatory Visit

## 2024-07-21 ENCOUNTER — Encounter

## 2024-07-27 ENCOUNTER — Ambulatory Visit

## 2024-09-16 ENCOUNTER — Ambulatory Visit: Admitting: Neurology

## 2024-10-20 ENCOUNTER — Encounter

## 2024-12-02 ENCOUNTER — Ambulatory Visit

## 2025-02-28 ENCOUNTER — Ambulatory Visit (HOSPITAL_COMMUNITY)
